# Patient Record
Sex: Female | Born: 1970 | Race: White | Hispanic: No | Marital: Married | Smoking: Never smoker
Health system: Southern US, Community
[De-identification: ages and names within clinical notes are randomized; demographics above are authoritative.]

## PROBLEM LIST (undated history)

## (undated) DIAGNOSIS — R55 Syncope and collapse: Secondary | ICD-10-CM

## (undated) DIAGNOSIS — M0520 Rheumatoid vasculitis with rheumatoid arthritis of unspecified site: Secondary | ICD-10-CM

## (undated) DIAGNOSIS — H269 Unspecified cataract: Secondary | ICD-10-CM

## (undated) DIAGNOSIS — N3010 Interstitial cystitis (chronic) without hematuria: Secondary | ICD-10-CM

## (undated) DIAGNOSIS — F419 Anxiety disorder, unspecified: Secondary | ICD-10-CM

## (undated) DIAGNOSIS — R079 Chest pain, unspecified: Secondary | ICD-10-CM

## (undated) DIAGNOSIS — S92353A Displaced fracture of fifth metatarsal bone, unspecified foot, initial encounter for closed fracture: Secondary | ICD-10-CM

## (undated) DIAGNOSIS — R002 Palpitations: Secondary | ICD-10-CM

## (undated) DIAGNOSIS — J329 Chronic sinusitis, unspecified: Secondary | ICD-10-CM

## (undated) DIAGNOSIS — M6289 Other specified disorders of muscle: Secondary | ICD-10-CM

## (undated) DIAGNOSIS — I8290 Acute embolism and thrombosis of unspecified vein: Secondary | ICD-10-CM

## (undated) DIAGNOSIS — N83209 Unspecified ovarian cyst, unspecified side: Secondary | ICD-10-CM

## (undated) DIAGNOSIS — R0789 Other chest pain: Secondary | ICD-10-CM

## (undated) DIAGNOSIS — I5189 Other ill-defined heart diseases: Secondary | ICD-10-CM

## (undated) DIAGNOSIS — D6851 Activated protein C resistance: Secondary | ICD-10-CM

## (undated) DIAGNOSIS — K859 Acute pancreatitis without necrosis or infection, unspecified: Secondary | ICD-10-CM

## (undated) DIAGNOSIS — M797 Fibromyalgia: Secondary | ICD-10-CM

## (undated) HISTORY — DX: Other ill-defined heart diseases: I51.89

## (undated) HISTORY — DX: Rheumatoid vasculitis with rheumatoid arthritis of unspecified site: M05.20

## (undated) HISTORY — DX: Palpitations: R00.2

## (undated) HISTORY — PX: WISDOM TOOTH EXTRACTION: SHX21

## (undated) HISTORY — PX: ABDOMINAL HYSTERECTOMY: SHX81

## (undated) HISTORY — DX: Syncope and collapse: R55

## (undated) HISTORY — DX: Unspecified cataract: H26.9

## (undated) HISTORY — PX: CHOLECYSTECTOMY: SHX55

## (undated) HISTORY — DX: Other chest pain: R07.89

## (undated) HISTORY — DX: Chest pain, unspecified: R07.9

## (undated) NOTE — *Deleted (*Deleted)
[facility] - Clinic Note  11/19/2019     CHIEF COMPLAINT Patient presents for No chief complaint on file.   HISTORY OF PRESENT ILLNESS: April [name] is a 18 y.o. 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 Evette Georges, pt states she had shingles several years ago and now is on  Valtrex daily  Referring physician: Sherron Monday, MD [address] [place],  [place] [address]  HISTORICAL INFORMATION:   Selected notes from the MEDICAL RECORD NUMBER Retinal Eval - Referred by Dr. Marcelino Freestone Tejan-Sie Pt has been on urology med, Elmirom, for over 5 yrs.  Eval for potential retinal damage.   CURRENT MEDICATIONS: No current outpatient medications on file. (Ophthalmic Drugs)   No current facility-administered medications for this visit. (Ophthalmic Drugs)   Current Outpatient Medications (Other)  Medication Sig  . albuterol (PROVENTIL HFA;VENTOLIN HFA) 108 (90 BASE) MCG/ACT inhaler Inhale 1 puff into the lungs as needed. For shortness of breath or wheezing  . Ascorbic Acid (VITA-C PO) Take by mouth.  [name] azelastine (ASTELIN) 0.1 % nasal spray as directed.  . baclofen (LIORESAL) 20 MG tablet Take 20 mg by mouth 3 (three) times daily.  . Biotin 60454 MCG  TABS Take by mouth daily.  . butalbital-acetaminophen-caffeine (FIORICET, ESGIC) 50-325-40 MG tablet TK 1 T PO Q 6 H PRN  . Calcium-Phosphorus-Vitamin D (CALCIUM GUMMIES PO) Take 1 tablet by mouth every evening.  . Cholecalciferol (VITAMIN D3) 5000 units TABS Take by mouth daily.  [name] co-enzyme Q-10 30 MG capsule Take 30 mg by mouth daily.  . Cyanocobalamin 5000 MCG SUBL Place 5,000 mcg under the tongue daily.   . diazepam (VALIUM) 10 MG tablet Take 10 mg by mouth daily.  [name] dicyclomine (BENTYL) 20 MG tablet Take 20 mg by mouth every 6 (six) hours.  [name] diltiazem (CARDIZEM CD) 120 MG 24 hr capsule TAKE 1 CAPSULE(120 MG) BY MOUTH DAILY  . ELMIRON 100 MG capsule Take 200 mg by mouth 2 (two) times daily.  [name] Fexofenadine HCl (ALLEGRA PO) Take 1 tablet by mouth daily as needed. For allergies/sinues  . fluconazole (DIFLUCAN) 150 MG tablet Take 150 mg by mouth daily as needed.   . fluticasone (FLONASE) 50 MCG/ACT nasal spray SHAKE LQ AND U 1 TO 2 SPRAYS IEN QD UTD  . Fluticasone-Salmeterol (ADVAIR) 250-50 MCG/DOSE AEPB Inhale 1 puff into the lungs 2 (two) times a day.   . furosemide (LASIX) 20 MG tablet Take 20 mg by mouth daily.  [name] guaiFENesin (MUCINEX) 600 MG 12 hr tablet Take 1,200 mg by mouth 2 (two) times daily.  . hydrOXYzine (ATARAX/VISTARIL) 50 MG tablet Take 50 mg by mouth at bedtime.  . hyoscyamine (LEVSIN SL) 0.125 MG SL  tablet Place 0.125 mg under the tongue every 4 (four) hours as needed for cramping.  . Lactobacillus (ACIDOPHILUS PROBIOTIC PO) Take by mouth 3 (three) times daily.  [name] lidocaine (XYLOCAINE) 5 % ointment Apply topically daily.  [name] Lysine 500 MG CAPS Take by mouth daily.  . montelukast (SINGULAIR) 10 MG tablet Take 10 mg by mouth at bedtime.  . Multiple Vitamin (MULITIVITAMIN WITH MINERALS) TABS Take 1 tablet by mouth every evening.   . nitrofurantoin (MACRODANTIN) 50 MG capsule Take 50 mg by mouth at bedtime.   [name] oxybutynin (DITROPAN-XL) 5 MG 24 hr tablet Take 5 mg by mouth as  needed.   [name] PROCTOZONE-HC 2.5 % rectal cream Apply topically as needed.  . progesterone (PROMETRIUM) 100 MG capsule Take 100 mg by mouth daily.   . Riboflavin (B2) 100 MG TABS Take by mouth daily.  . Simethicone 125 MG CAPS Take 125 mg by mouth 4 (four) times daily.  . TURMERIC PO Take by mouth daily.  [name] VALACYCLOVIR HCL PO Take 1,000 mg by mouth daily.   [name] VITAMIN A PO Take 3,000 mg by mouth daily.  . Zinc 25 MG TABS Take by mouth daily.   Current Facility-Administered Medications (Other)  Medication Route  . betamethasone acetate-betamethasone sodium phosphate (CELESTONE) injection 3 mg Intramuscular      REVIEW OF SYSTEMS:    ALLERGIES Allergies  Allergen Reactions  . Amitriptyline Other (See Comments)    Other reaction(s): Other Change in mental status  . Amoxicillin Rash  . Bromelains     Other reaction(s): Abdominal pain  . Celexa [Citalopram Hydrobromide] Other (See Comments)    Other reaction(s): Other Change in mental status  . Ivp Dye  [Iodinated Diagnostic Agents] Anaphylaxis  . Nortriptyline     Other reaction(s): Other  . Penicillins Swelling and Rash  . Pregabalin     Other reaction(s): Myalgias (muscle pain)  . Bromelains [Pineapple Extract] Other (See Comments)    Severe pain  . Budesonide-Formoterol Fumarate     Other reaction(s): Other  . Citalopram Other (See Comments)  . Mangifera Indica Swelling  . Methylene Blue Other (See Comments)    Caused inflamed pancreas, per pt should not use  . Sertraline Other (See Comments)    Other reaction(s): Other Suicidal thoughts  . Sulfa Antibiotics Other (See Comments)  . Sulfonamide Derivatives Other (See Comments)    excrutiating pain, patient states it caused her to have osteoarthritis  . Bromaline [Albertsons Di Bromm] Rash  . Other Rash    Mango    PAST MEDICAL HISTORY Past Medical History:  Diagnosis Date  . Anxiety 12/14/2011  . Asthma    no inhaler  . Chest pain    a. 12/2015 CTA chest:  no PE. No significant coronary Ca2+. Mosaic attenuation pattern in both lungs, may reflect small airway dzs; c. 12/2015 Ex MV: Walked 6 mins. No ischemia/scar.  . Diastolic dysfunction    a. 10/2015 Echo: EF nl, mod diast dysfxn. Mild MR/TR; b. 09/2016 Echo: EF 55-60%, no rwma. Nl RV size and fxn.  . Factor 5 Leiden mutation, heterozygous (HCC)   . Fibromyalgia   . Fracture of 5th metatarsal 03/2011   left foot -    . Heart palpitations    a. Notes intermittent tachycardia (sinus). Did not tolerate beta blockers 2/2 urinary incontinence.  . Interstitial cystitis   . Ovarian cyst   . Pancreatitis   . Pelvic floor dysfunction   . Rheumatoid arteritis (HCC)   .  Sinus infection    chronic  . Syncope    a. 08/2017 Event monitor: Average heart rate 77 (49-136).  Periods of sinus arrhythmia noted.  Triggered events corresponded with sinus rhythm, sinus arrhythmia, and artifact.  No significant ectopy, sustained arrhythmias, or prolonged pauses observed.  . 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   Past Surgical History:  Procedure Laterality Date  . ABDOMINAL HYSTERECTOMY    . CHOLECYSTECTOMY    . CYSTOSCOPY WITH HYDRODISTENSION AND BIOPSY  04/21/2011   Dr Lorenz Coaster  . LAPAROSCOPIC ASSISTED VAGINAL HYSTERECTOMY  08/21/2011   Procedure: LAPAROSCOPIC ASSISTED VAGINAL HYSTERECTOMY;  Surgeon: Jeani Hawking, MD;  Location: [facility];  Service: Gynecology;  Laterality: N/A;  OPEN LAPAROSCOPIC  . LAPAROSCOPIC NISSEN FUNDOPLICATION  2007  . SALPINGOOPHORECTOMY  08/21/2011   Procedure: SALPINGO OOPHERECTOMY;  Surgeon: Jeani Hawking, MD;  Location: [facility];  Service: Gynecology;  Laterality: Bilateral;  . TUBAL LIGATION  2010  . WISDOM TOOTH EXTRACTION      FAMILY HISTORY Family History  Problem Relation Age of Onset  . Heart Problems Mother   . Vascular Disease Mother   . Vascular  Disease Maternal Grandmother   . Anesthesia problems Neg Hx     SOCIAL HISTORY Social History   Tobacco Use  . Smoking status: Never Smoker  . Smokeless tobacco: Never Used  Vaping Use  . Vaping Use: Never used  Substance Use Topics  . Alcohol use: No  . Drug use: No         OPHTHALMIC EXAM:  Not recorded     IMAGING AND PROCEDURES  Imaging and Procedures for @TODAY @           ASSESSMENT/PLAN:    ICD-10-CM   1. Retinal edema  H35.81   2. Myopia of both eyes with astigmatism  H52.13    H52.203   3. Combined forms of age-related cataract of both eyes  H25.813     1. No retinal edema on exam or OCT  - no pigmentary maculopathy or degeneration consistent with Elmiron toxicity  - pt reports taking po Elmiron, 200 mg BID, since 2012 (8 yrs)  - OCT and autofluorescence without RPE atrophy  - recommend   2. Myopia with astigmatism OU  3. 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  - discussed diagnosis and progression  - not yet visually significant  - monitor for now   Ophthalmic Meds Ordered this visit:  No orders of the defined types were placed in this encounter.      No follow-ups on file.  There are no Patient Instructions on file for this visit.   Explained the diagnoses, plan, and follow up with the patient and they expressed understanding.  Patient expressed understanding of the importance of proper follow up care. '  This document serves as a record of services personally performed by Karie Chimera, MD, PhD. It was created on their behalf by Annalee Genta, COMT. The creation of this record is the provider's dictation and/or activities during the visit.  Electronically signed by: Annalee Genta, COMT 11/18/19 10:38 AM     Karie Chimera, M.D., Ph.D. Diseases & Surgery of the Retina and Vitreous [facility] 11/19/18      Abbreviations: Judie Petit  myopia (nearsighted); A astigmatism; H hyperopia (farsighted); P presbyopia; Mrx spectacle prescription;  CTL contact lenses; OD right eye; OS left eye; OU both eyes  XT exotropia; ET esotropia; PEK punctate epithelial keratitis; PEE punctate epithelial erosions; DES dry eye syndrome; MGD meibomian gland dysfunction; ATs artificial tears; PFAT's preservative free artificial tears; NSC nuclear sclerotic cataract; PSC posterior subcapsular cataract; ERM epi-retinal membrane; PVD posterior vitreous detachment; RD retinal detachment; DM diabetes mellitus; DR diabetic retinopathy; NPDR non-proliferative diabetic retinopathy; PDR proliferative diabetic retinopathy; CSME clinically significant macular edema; DME diabetic macular edema; dbh dot blot hemorrhages; CWS cotton wool spot; POAG primary open angle glaucoma; C/D cup-to-disc ratio; HVF humphrey visual field; GVF goldmann visual field; OCT optical coherence tomography; IOP intraocular pressure; BRVO Branch retinal vein occlusion; CRVO central retinal vein occlusion; CRAO central retinal artery occlusion; BRAO branch retinal artery occlusion; RT retinal tear; SB scleral buckle; PPV pars plana vitrectomy; VH Vitreous hemorrhage; PRP panretinal laser photocoagulation; IVK intravitreal kenalog; VMT vitreomacular traction; MH Macular hole;  NVD neovascularization of the disc; NVE neovascularization elsewhere; AREDS age related eye disease study; ARMD age related macular degeneration; POAG primary open angle glaucoma; EBMD epithelial/anterior basement membrane dystrophy; ACIOL anterior chamber intraocular lens; IOL intraocular lens; PCIOL posterior chamber intraocular lens; Phaco/IOL phacoemulsification with intraocular lens placement; PRK photorefractive keratectomy; LASIK laser assisted in situ keratomileusis; HTN hypertension; DM diabetes mellitus; COPD chronic obstructive pulmonary disease

---

## 2002-10-27 ENCOUNTER — Other Ambulatory Visit: Admission: RE | Admit: 2002-10-27 | Discharge: 2002-10-27 | Payer: Self-pay | Admitting: Obstetrics and Gynecology

## 2003-05-14 ENCOUNTER — Inpatient Hospital Stay (HOSPITAL_COMMUNITY): Admission: AD | Admit: 2003-05-14 | Discharge: 2003-05-16 | Payer: Self-pay | Admitting: Obstetrics and Gynecology

## 2003-06-25 ENCOUNTER — Other Ambulatory Visit: Admission: RE | Admit: 2003-06-25 | Discharge: 2003-06-25 | Payer: Self-pay | Admitting: Obstetrics and Gynecology

## 2004-08-01 ENCOUNTER — Other Ambulatory Visit: Admission: RE | Admit: 2004-08-01 | Discharge: 2004-08-01 | Payer: Self-pay | Admitting: Obstetrics and Gynecology

## 2004-12-27 ENCOUNTER — Inpatient Hospital Stay (HOSPITAL_COMMUNITY): Admission: AD | Admit: 2004-12-27 | Discharge: 2004-12-30 | Payer: Self-pay | Admitting: Obstetrics and Gynecology

## 2004-12-28 ENCOUNTER — Encounter (INDEPENDENT_AMBULATORY_CARE_PROVIDER_SITE_OTHER): Payer: Self-pay | Admitting: Specialist

## 2005-01-30 HISTORY — PX: LAPAROSCOPIC NISSEN FUNDOPLICATION: SHX1932

## 2005-02-17 ENCOUNTER — Other Ambulatory Visit: Admission: RE | Admit: 2005-02-17 | Discharge: 2005-02-17 | Payer: Self-pay | Admitting: Obstetrics and Gynecology

## 2005-08-09 ENCOUNTER — Emergency Department: Payer: Self-pay | Admitting: Emergency Medicine

## 2008-01-31 HISTORY — PX: TUBAL LIGATION: SHX77

## 2008-02-10 ENCOUNTER — Ambulatory Visit: Payer: Self-pay | Admitting: Oncology

## 2008-03-17 ENCOUNTER — Inpatient Hospital Stay (HOSPITAL_COMMUNITY): Admission: AD | Admit: 2008-03-17 | Discharge: 2008-03-17 | Payer: Self-pay | Admitting: Obstetrics and Gynecology

## 2008-03-26 ENCOUNTER — Ambulatory Visit (HOSPITAL_COMMUNITY): Admission: RE | Admit: 2008-03-26 | Discharge: 2008-03-26 | Payer: Self-pay | Admitting: Obstetrics and Gynecology

## 2008-05-10 ENCOUNTER — Inpatient Hospital Stay (HOSPITAL_COMMUNITY): Admission: AD | Admit: 2008-05-10 | Discharge: 2008-05-10 | Payer: Self-pay | Admitting: Obstetrics and Gynecology

## 2008-05-12 ENCOUNTER — Inpatient Hospital Stay (HOSPITAL_COMMUNITY): Admission: AD | Admit: 2008-05-12 | Discharge: 2008-05-12 | Payer: Self-pay | Admitting: Obstetrics and Gynecology

## 2008-05-18 ENCOUNTER — Inpatient Hospital Stay (HOSPITAL_COMMUNITY): Admission: RE | Admit: 2008-05-18 | Discharge: 2008-05-20 | Payer: Self-pay | Admitting: Obstetrics and Gynecology

## 2008-05-21 ENCOUNTER — Encounter: Admission: RE | Admit: 2008-05-21 | Discharge: 2008-06-19 | Payer: Self-pay | Admitting: Obstetrics and Gynecology

## 2008-06-20 ENCOUNTER — Encounter: Admission: RE | Admit: 2008-06-20 | Discharge: 2008-07-20 | Payer: Self-pay | Admitting: Obstetrics and Gynecology

## 2008-07-21 ENCOUNTER — Encounter: Admission: RE | Admit: 2008-07-21 | Discharge: 2008-08-19 | Payer: Self-pay | Admitting: Obstetrics and Gynecology

## 2008-08-20 ENCOUNTER — Encounter: Admission: RE | Admit: 2008-08-20 | Discharge: 2008-09-19 | Payer: Self-pay | Admitting: Obstetrics and Gynecology

## 2008-09-20 ENCOUNTER — Encounter: Admission: RE | Admit: 2008-09-20 | Discharge: 2008-10-08 | Payer: Self-pay | Admitting: Obstetrics and Gynecology

## 2010-02-04 ENCOUNTER — Emergency Department: Payer: Self-pay | Admitting: Emergency Medicine

## 2010-02-07 ENCOUNTER — Encounter (INDEPENDENT_AMBULATORY_CARE_PROVIDER_SITE_OTHER): Payer: Self-pay | Admitting: *Deleted

## 2010-03-03 NOTE — Letter (Signed)
Summary: New Patient letter  [facility]  [address] [place], [place] [address]   Phone: [phone]  Fax: [phone]       02/07/2010 MRN: [id number]  April [name] [address] [place], [place]  [address]  Dear Ms. Bernerd Pho,  Welcome to the Gastroenterology Division at [facility].    You are scheduled to see Dr.  Rob Bunting on March 14, 2010 at 8:30am on the 3rd floor at [facility], [address]  We ask that you try to arrive at our office 15 minutes prior to your appointment time to allow for check-in.  We would like you to complete the enclosed self-administered evaluation form prior to your visit and bring it with you on the day of your appointment.  We will review it with you.  Also, please bring a complete list of all your medications or, if you prefer, bring the medication bottles and we will list them.  Please bring your insurance card so that we may make a copy of it.  If your insurance requires a referral to see a specialist, please bring your referral form from your primary care physician.  Co-payments are due at the time of your visit and may be paid by cash, check or credit card.     Your office visit will consist of a consult with your physician (includes a physical exam), any laboratory testing he/she may order, scheduling of any necessary diagnostic testing (e.g. x-ray, ultrasound, CT-scan), and scheduling of a procedure (e.g. Endoscopy, Colonoscopy) if required.  Please allow enough time on your schedule to allow for any/all of these possibilities.    If you cannot keep your appointment, please call [phone] to cancel or reschedule prior to your appointment date.  This allows [place] the opportunity to schedule an appointment for another patient in need of care.  If you do not cancel or reschedule by 5 p.m. the business day prior to your appointment date, you will be charged a $50.00 late cancellation/no-show fee.    Thank you for  choosing [facility] for your medical needs.  We appreciate the opportunity to care for you.  Please visit [place] at our website  to learn more about our practice.                     Sincerely,                                                             The Gastroenterology Division

## 2010-03-14 ENCOUNTER — Encounter (INDEPENDENT_AMBULATORY_CARE_PROVIDER_SITE_OTHER): Payer: Self-pay | Admitting: *Deleted

## 2010-03-14 ENCOUNTER — Encounter: Payer: Self-pay | Admitting: Gastroenterology

## 2010-03-14 ENCOUNTER — Ambulatory Visit (INDEPENDENT_AMBULATORY_CARE_PROVIDER_SITE_OTHER): Payer: Self-pay | Admitting: Gastroenterology

## 2010-03-14 ENCOUNTER — Other Ambulatory Visit: Payer: Self-pay

## 2010-03-14 ENCOUNTER — Other Ambulatory Visit: Payer: Self-pay | Admitting: Gastroenterology

## 2010-03-14 DIAGNOSIS — R0989 Other specified symptoms and signs involving the circulatory and respiratory systems: Secondary | ICD-10-CM

## 2010-03-14 DIAGNOSIS — K589 Irritable bowel syndrome without diarrhea: Secondary | ICD-10-CM

## 2010-03-14 DIAGNOSIS — R109 Unspecified abdominal pain: Secondary | ICD-10-CM | POA: Insufficient documentation

## 2010-03-14 DIAGNOSIS — R197 Diarrhea, unspecified: Secondary | ICD-10-CM

## 2010-03-14 DIAGNOSIS — R0609 Other forms of dyspnea: Secondary | ICD-10-CM

## 2010-03-14 LAB — CBC WITH DIFFERENTIAL/PLATELET
Basophils Relative: 0.4 % (ref 0.0–3.0)
Eosinophils Relative: 1.3 % (ref 0.0–5.0)
Lymphocytes Relative: 32.6 % (ref 12.0–46.0)
MCV: 95.1 fl (ref 78.0–100.0)
Monocytes Absolute: 0.5 10*3/uL (ref 0.1–1.0)
Monocytes Relative: 8.6 % (ref 3.0–12.0)
Neutrophils Relative %: 57.1 % (ref 43.0–77.0)
RBC: 4.07 Mil/uL (ref 3.87–5.11)
WBC: 5.9 10*3/uL (ref 4.5–10.5)

## 2010-03-14 LAB — COMPREHENSIVE METABOLIC PANEL
Albumin: 3.9 g/dL (ref 3.5–5.2)
BUN: 5 mg/dL — ABNORMAL LOW (ref 6–23)
CO2: 28 mEq/L (ref 19–32)
Calcium: 9.1 mg/dL (ref 8.4–10.5)
Chloride: 101 mEq/L (ref 96–112)
Glucose, Bld: 78 mg/dL (ref 70–99)
Potassium: 4.4 mEq/L (ref 3.5–5.1)

## 2010-03-14 LAB — SEDIMENTATION RATE: Sed Rate: 23 mm/hr — ABNORMAL HIGH (ref 0–22)

## 2010-03-14 LAB — IGA: IgA: 175 mg/dL (ref 68–378)

## 2010-03-23 NOTE — Procedures (Signed)
Summary: Instructions for procedure/[place]  Instructions for procedure/[place]   Imported By: Sherian Rein 03/17/2010 09:48:55  _____________________________________________________________________  External Attachment:    Type:   Image     Comment:   External Document

## 2010-03-23 NOTE — Assessment & Plan Note (Signed)
History of Present Illness Visit Type: Initial Consult Primary GI MD: Rob Bunting MD Primary Provider: Oneta Rack, MD Chief Complaint:  multiple GI symptoms History of Present Illness:      Pleasant 40 year old woman who  was put on cipro recently for abd pains, left sided.    has been on abx recently for sinus infection.  she has had abd pains for a bout a decade   she had lap chol in 1997, retained CBD stones and then ERCP afterword.  She had pancreatitis (thought to be from retained stone), then ERCP.  10 day hospital stay.  she has had chronic loose stools for 10-15 years (up to 3-4 times a day).  She can have alternating constipation/diarrhea.    she was treated for serologic + h. pylori in 2009.  lap nissen in 2007 for GERD symptoms, was on maximum acid blockers at that point.  Certain foods are not bothersome (caused pain, bloating).  since then her symptoms only occur several times a month.  She is not on antiacid meds anymore.  She had EGD in 2007,  she is unclear of the results  Dr. Rosalita Levan at [facility].  She had a small bowel capsule around that time (unclear of the results).  Lately she is still bothered by HIP pain and BACK pains.  She also has lower abd, pelvic pressure.  she also has back headaches that are improved with BMs.   Sitting down can make her lower pelvic pressure worse.  She has bloating.  She takes hyomax for bloating, sits in warm water, believes these are gas related.    Never nocturnal BMs,  never blood in her stool.  She has been gaining weight, overall up 10 pounds in 6 months.    she still has heartburn occasionally, very difficult with essentially pan GI symptoms.  Less than once a month.  was on cipro recently, bowels much better.  she is on no NSAIDs.           Current Medications (verified): 1)  Tramadol Hcl 50 Mg Tabs (Tramadol Hcl) .[name].. 1 By Mouth Once Daily 2)  Calcium 500 Mg Tabs (Calcium) .[name].. 1 By  Mouth Once Daily 3)  Multivitamins  Caps (Multiple Vitamin) .[name].. 1 By Mouth Once Daily 4)  Advair Hfa 45-21 Mcg/act Aero (Fluticasone-Salmeterol) .... 2 Times Daily 5)  Vitamin A & D 8000-400 Unit Caps (Vitamins A & D) .[name].. 1 By Mouth Once Daily 6)  Zyrtec Allergy 10 Mg Caps (Cetirizine Hcl) .... Take 1 By Mouth Once Daily 7)  Hyomax-Sl 0.125 Mg Subl (Hyoscyamine Sulfate) .... Take As Needed  Allergies (verified): 1)  ! Pcn 2)  ! Sulfa 3)  ! * Methaline Blue  Past History:  Past Medical History: ovarian cyst, arthritis, asthma, chronic headaches, gallstone disease, irritable bowel syndrome, biliary pancreatitis 1997, urinary tract infection, GERD  Past Surgical History: tubal ligation 2010, cholecystectomy 1997, ERCP 1997 for retained bile  duct stone, Nissen fundoplication 2007, hemorrhoidectomy 2000,  Family History: no colon cancer  Social History: she is married, she has 4 children, she does not smoke cigarettes or drink alcohol.  Review of Systems       Pertinent positive and negative review of systems were noted in the above HPI and GI specific review of systems.  All other review of systems was otherwise negative.   Vital Signs:  Patient profile:   40 year old female Height:      65 inches  Weight:      153 pounds BMI:     25.55 Pulse rate:   100 / minute Pulse rhythm:   regular BP sitting:   98 / 50  (left arm)  Vitals Entered By: Milford Cage NCMA (March 14, 2010 8:55 AM)  Physical Exam  Additional Exam:  Constitutional: generally well appearing Psychiatric: alert and oriented times 3 Eyes: extraocular movements intact Mouth: oropharynx moist, no lesions Neck: supple, no lymphadenopathy Cardiovascular: heart regular rate and rythm Lungs: CTA bilaterally Abdomen: soft, non-tender, non-distended, no obvious ascites, no peritoneal signs, normal bowel sounds Extremities: no lower extremity edema bilaterally Skin: no lesions on visible  extremities    Impression & Recommendations:  Problem # 1:  Multiple upper and lower GI symptoms most likely functional symptoms. GERD may contribute. She had her gallbladder removed and predominately has loose stools so perhaps bile salt dysregulation is contributing. She will get a basic set of labs including CBC, complete metabolic profile, thyroid testing, sedimentation rate, celiac sprue testing. We will set her up for colonoscopy at her soonest convenience. She will try cholestyramine powder once daily and knows that she can titrate this up or down as needed.  Problem # 2:  back pain, hip pain, neck pain I explained to her that it is unlikely that any of these symptoms are GI related and probably would not be able to explain them.  Other Orders: TLB-CBC Platelet - w/Differential (85025-CBCD) TLB-CMP (Comprehensive Metabolic Pnl) (80053-COMP) T-Sprue Panel (Celiac Disease Aby Eval) (83516x3/[address]) TLB-IgA (Immunoglobulin A) (82784-IGA) TLB-Sedimentation Rate (ESR) (85652-ESR)  Patient Instructions: 1)  You will be scheduled to have a colonoscopy at [facility] with propfol. 2)  You will get lab test(s) done today (cbc, cmet, celiac panel, total IgA, ESR). 3)  We will get records from [facility]. 4)  Trial of cholestyramine powder, once daily. 5)  A copy of this information will be sent to Dr. Vincente Poli, Dr. Oneta Rack. 6)  The medication list was reviewed and reconciled.  All changed / newly prescribed medications were explained.  A complete medication list was provided to the patient / caregiver. Prescriptions: CHOLESTYRAMINE   POWD (CHOLESTYRAMINE) Take 4 grams powder, once daily  #1 month x 11   Entered and Authorized by:   Rachael Fee MD   Signed by:   Rachael Fee MD on 03/14/2010   Method used:   Electronically to        [facility] [address] (retail)       [address] [place], [place]  [address]        Ph: [phone]       Fax: [phone]   RxID:   [id number]   Appended Document: Orders Update/Movi    Clinical Lists Changes  Medications: Added new medication of MOVIPREP 100 GM  SOLR (PEG-KCL-NACL-NASULF-NA ASC-C) As per prep instructions. - Signed Rx of MOVIPREP 100 GM  SOLR (PEG-KCL-NACL-NASULF-NA ASC-C) As per prep instructions.;  #1 x 0;  Signed;  Entered by: Chales Abrahams CMA (AAMA);  Authorized by: Rachael Fee MD;  Method used: Electronically to [facility] [address], [place], [place], [place]  [address], Ph: [phone], Fax: [phone] Orders: Added new Test order of ZCOL (ZCOL) - Signed    Prescriptions: MOVIPREP 100 GM  SOLR (PEG-KCL-NACL-NASULF-NA ASC-C) As per prep instructions.  #1 x 0  Entered by:   Chales Abrahams CMA (AAMA)   Authorized by:   Rachael Fee MD   Signed by:   Chales Abrahams CMA (AAMA) on 03/14/2010   Method used:   Electronically to        [facility] [address] (retail)       [address] [place], [place]  [address]       Ph: [phone]       Fax: [phone]   RxID:   [id number]

## 2010-03-23 NOTE — Letter (Signed)
Summary: [facility] Instructions  [facility]  [address] [place], [place] [address]   Phone: [phone]  Fax: [phone]       April [name]    Sep 21, 1970    MRN: [id number]        Procedure Day /Date:03/31/10 THURS     Arrival ONGE:9528 am     Procedure Time:145 pm     Location of Procedure:                     X  [facility] ( Outpatient Registration)   PREPARATION FOR COLONOSCOPY WITH MOVIPREP   Starting 5 days prior to your procedure 03/26/10 do not eat nuts, seeds, popcorn, corn, beans, peas,  salads, or any raw vegetables.  Do not take any fiber supplements (e.g. Metamucil, Citrucel, and Benefiber).  THE DAY BEFORE YOUR PROCEDURE         DATE: 03/30/10  DAY: WED  1.  Drink clear liquids the entire day-NO SOLID FOOD  2.  Do not drink anything colored red or purple.  Avoid juices with pulp.  No orange juice.  3.  Drink at least 64 oz. (8 glasses) of fluid/clear liquids during the day to prevent dehydration and help the prep work efficiently.  CLEAR LIQUIDS INCLUDE: Water Jello Ice Popsicles Tea (sugar ok, no milk/cream) Powdered fruit flavored drinks Coffee (sugar ok, no milk/cream) Gatorade Juice: apple, white grape, white cranberry  Lemonade Clear bullion, consomm, broth Carbonated beverages (any kind) Strained chicken noodle soup Hard Candy                             4.  In the morning, mix first dose of MoviPrep solution:    Empty 1 Pouch A and 1 Pouch B into the disposable container    Add lukewarm drinking water to the top line of the container. Mix to dissolve    Refrigerate (mixed solution should be used within 24 hrs)  5.  Begin drinking the prep at 5:00 p.m. The MoviPrep container is divided by 4 marks.   Every 15 minutes drink the solution down to the next mark (approximately 8 oz) until the full liter is complete.   6.  Follow completed prep with 16 oz of clear liquid of your choice (Nothing red or purple).   Continue to drink clear liquids until bedtime.  7.  Before going to bed, mix second dose of MoviPrep solution:    Empty 1 Pouch A and 1 Pouch B into the disposable container    Add lukewarm drinking water to the top line of the container. Mix to dissolve    Refrigerate  THE DAY OF YOUR PROCEDURE      DATE: 03/31/10  DAY: THURS  Beginning at 845 a.m. (5 hours before procedure):         1. Every 15 minutes, drink the solution down to the next mark (approx 8 oz) until the full liter is complete.  Nothing to eat or drink after midnight except prep solution  MEDICATION INSTRUCTIONS  Unless otherwise instructed, you should take regular prescription medications with a small sip of water   as early as possible the morning of your procedure.           OTHER INSTRUCTIONS  You will need a responsible adult at least 39 years of age to accompany you and drive you home.   This person must remain in the  waiting room during your procedure.  Wear loose fitting clothing that is easily removed.  Leave jewelry and other valuables at home.  However, you may wish to bring a book to read or  an iPod/MP3 player to listen to music as you wait for your procedure to start.  Remove all body piercing jewelry and leave at home.  Total time from sign-in until discharge is approximately 2-3 hours.  You should go home directly after your procedure and rest.  You can resume normal activities the  day after your procedure.  The day of your procedure you should not:   Drive   Make legal decisions   Operate machinery   Drink alcohol   Return to work  You will receive specific instructions about eating, activities and medications before you leave.    The above instructions have been reviewed and explained to me by   _______________________    I fully understand and can verbalize these instructions _____________________________ Date _________

## 2010-03-31 ENCOUNTER — Ambulatory Visit (HOSPITAL_COMMUNITY)
Admission: RE | Admit: 2010-03-31 | Discharge: 2010-03-31 | Disposition: A | Discharge status: Home / Self Care | Payer: Self-pay | Source: Ambulatory Visit | Attending: Gastroenterology | Admitting: Gastroenterology

## 2010-03-31 ENCOUNTER — Other Ambulatory Visit: Payer: Self-pay | Admitting: Gastroenterology

## 2010-03-31 DIAGNOSIS — R197 Diarrhea, unspecified: Secondary | ICD-10-CM

## 2010-03-31 DIAGNOSIS — K219 Gastro-esophageal reflux disease without esophagitis: Secondary | ICD-10-CM | POA: Insufficient documentation

## 2010-03-31 DIAGNOSIS — K648 Other hemorrhoids: Secondary | ICD-10-CM | POA: Insufficient documentation

## 2010-03-31 DIAGNOSIS — R109 Unspecified abdominal pain: Secondary | ICD-10-CM | POA: Insufficient documentation

## 2010-03-31 DIAGNOSIS — K5730 Diverticulosis of large intestine without perforation or abscess without bleeding: Secondary | ICD-10-CM

## 2010-03-31 DIAGNOSIS — J45909 Unspecified asthma, uncomplicated: Secondary | ICD-10-CM | POA: Insufficient documentation

## 2010-04-07 NOTE — Procedures (Signed)
Summary: Colonoscopy  Patient: [name] Note: All result statuses are Final unless otherwise noted.  Tests: (1) Colonoscopy (COL)   COL Colonoscopy           DONE     [facility]     [address] [place], [place]  [address]           COLONOSCOPY PROCEDURE REPORT           PATIENT:  April [name], April [name]  MR#:  [id number]     BIRTHDATE:  1970-04-27, 39 yrs. old  GENDER:  female     ENDOSCOPIST:  Rachael Fee, MD     REF. BY:  Lucky Cowboy, M.D.     PROCEDURE DATE:  03/31/2010     PROCEDURE:  Colonoscopy with biopsy     ASA CLASS:  Class II     INDICATIONS:  abd pains, chronic loose stools     MEDICATIONS:   MAC sedation, administered by CRNA           DESCRIPTION OF PROCEDURE:   After the risks benefits and     alternatives of the procedure were thoroughly explained, informed     consent was obtained.  Digital rectal exam was performed and     revealed no rectal masses.   The Pentax Ped Colon [id number]     endoscope was introduced through the anus and advanced to the     terminal ileum which was intubated for a short distance, without     limitations.  The quality of the prep was good, using MoviPrep.     The instrument was then slowly withdrawn as the colon was fully     examined.<<PROCEDUREIMAGES>>           FINDINGS:  There were a few small scattered left sided     diverticulum.  Internal Hemorrhoids were found.  The terminal     ileum appeared normal (see image002).  This was otherwise a normal     examination of the colon (see image001 and image003).     Retroflexed views in the rectum revealed no abnormalities.    The     scope was then withdrawn from the patient and the procedure     completed.     COMPLICATIONS:  None           ENDOSCOPIC IMPRESSION:     1) A few left sided diverticulum     2) Small nternal hemorrhoids     3) Normal terminal ileum; no Crohn's disease     4) Otherwise normal examination;  colon was randomly biopsied to     check  for microscopic colitis.           RECOMMENDATIONS:     Await final biopsy report.     For now, stay on cholestyramine powder. Titrate as needed.           ______________________________     Rachael Fee, MD           cc: Marcelle Overlie, MD           n.     eSIGNED:   Rachael Fee at 03/31/2010 01:45 PM           Debroah Loop, [id number]  Note: An exclamation mark (!) indicates a result that was not dispersed into the flowsheet. Document Creation Date: 03/31/2010 1:46 PM _______________________________________________________________________  (1) Order result status: Final Collection or observation date-time: 03/31/2010 13:40  Requested date-time:  Receipt date-time:  Reported date-time:  Referring Physician:   Ordering Physician: Rob Bunting [phone]) Specimen Source:  Source: Launa Grill Order Number: [id number] Lab site:

## 2010-04-21 ENCOUNTER — Telehealth: Payer: Self-pay | Admitting: Gastroenterology

## 2010-04-21 NOTE — Telephone Encounter (Signed)
Pt woke up and had acid reflux that was so bad it came up into her mouth and was actually wetting her pillow, was very foamy and yellow.  After she had been lying down for two hours, she had taken doxycycline and eaten a salad just before she went to bed.  She now feels like she has something stuck in her throat.  She has chest pain that is constant.  She had been prescribed Meloxicam and took it for 5 days and then stopped because of the above symptoms.  She took Prilosec today and tried Pepcid twice daily for 1 week with no relief.  She last had EGD 5 years ago as well [employer].  We have not received records from [facility] so she will call them and make sure they send those today.   Please advise

## 2010-04-21 NOTE — Telephone Encounter (Signed)
Left message on machine to call back  

## 2010-04-21 NOTE — Telephone Encounter (Signed)
Pt aware she will stop the med and start the PPI twice daily and call to report on symptoms she does not need refill at this time

## 2010-04-21 NOTE — Telephone Encounter (Signed)
doxycilcine can cause ulcers in the esophagus. Not sure why she is taking it, but she should hold it for a week unless absolutely necessary.  Should be taking PPI twice daily.  She needs to call in one week to report on her symtoms

## 2010-05-11 LAB — COMPREHENSIVE METABOLIC PANEL
ALT: 18 U/L (ref 0–35)
AST: 31 U/L (ref 0–37)
Albumin: 2.9 g/dL — ABNORMAL LOW (ref 3.5–5.2)
Alkaline Phosphatase: 73 U/L (ref 39–117)
BUN: 2 mg/dL — ABNORMAL LOW (ref 6–23)
Chloride: 105 mEq/L (ref 96–112)
GFR calc Af Amer: >60 mL/min (ref >60)
Potassium: 3.5 mEq/L (ref 3.5–5.1)
Sodium: 136 mEq/L (ref 135–145)
Total Bilirubin: 0.8 mg/dL (ref 0.3–1.2)
Total Protein: 5.9 g/dL — ABNORMAL LOW (ref 6.0–8.3)

## 2010-05-11 LAB — URINALYSIS, ROUTINE W REFLEX MICROSCOPIC
Glucose, UA: NEGATIVE mg/dL
Hgb urine dipstick: NEGATIVE
Ketones, ur: 40 mg/dL — AB
Leukocytes, UA: NEGATIVE
Leukocytes, UA: NEGATIVE
Nitrite: NEGATIVE
Protein, ur: 30 mg/dL — AB
Specific Gravity, Urine: 1.025 (ref 1.005–1.030)
Urobilinogen, UA: 0.2 mg/dL (ref 0.0–1.0)
pH: 6.5 (ref 5.0–8.0)
pH: 6.5 (ref 5.0–8.0)

## 2010-05-11 LAB — RPR: RPR Ser Ql: NONREACTIVE

## 2010-05-11 LAB — CBC
HCT: 35.6 % — ABNORMAL LOW (ref 36.0–46.0)
MCHC: 34.3 g/dL (ref 30.0–36.0)
MCV: 96.3 fL (ref 78.0–100.0)
MCV: 97.3 fL (ref 78.0–100.0)
Platelets: 156 10*3/uL (ref 150–400)
Platelets: 167 10*3/uL (ref 150–400)
RDW: 13.1 % (ref 11.5–15.5)
RDW: 13.3 % (ref 11.5–15.5)
RDW: 12.9 % (ref 11.5–15.5)
WBC: 8.5 10*3/uL (ref 4.0–10.5)
WBC: 6.4 10*3/uL (ref 4.0–10.5)

## 2010-05-11 LAB — URINE MICROSCOPIC-ADD ON: RBC / HPF: NONE SEEN RBC/hpf (ref <3)

## 2010-05-11 LAB — URIC ACID: Uric Acid, Serum: 7 mg/dL (ref 2.4–7.0)

## 2010-05-17 LAB — PROTEIN S, TOTAL: Protein S Ag, Total: 65 % — ABNORMAL LOW (ref 70–140)

## 2010-05-17 LAB — LUPUS ANTICOAGULANT PANEL: PTT Lupus Anticoagulant: 39.4 secs (ref 36.3–48.8)

## 2010-05-17 LAB — BETA-2-GLYCOPROTEIN I ABS, IGG/M/A
Beta-2-Glycoprotein I IgA: 4 U/mL (ref <10)
Beta-2-Glycoprotein I IgM: <4 U/mL (ref <10)

## 2010-05-17 LAB — ANTITHROMBIN III: AntiThromb III Func: 116 % (ref 76–126)

## 2010-05-17 LAB — CARDIOLIPIN ANTIBODIES, IGG, IGM, IGA
Anticardiolipin IgA: <7 [APL'U] — ABNORMAL LOW (ref <13)
Anticardiolipin IgM: 20 [MPL'U] (ref <10)

## 2010-05-17 LAB — PROTHROMBIN GENE MUTATION

## 2010-06-14 NOTE — Letter (Signed)
March 26, 2008    April [name], M.D.  [address] [place]  [place] [address]   RE:   April, [name]  MR#   [id number]  ACC#        [id number]   MFM [name] REPORT   Dear Dr. Vincente [name],   Thank you for referring your patient, April [name] for maternal fetal  medicine [name].  As you are aware, April [name] is a 40 year old  gravida 4, para 3, at [redacted] weeks gestation based on her last menstrual  period and confirmed and by early ultrasound.  As you are also aware,  April [name] has recently been found to be heterozygous for Factor V Leiden  mutation.   April [name] was feeling well today and had no specific complaints.  She  reported good fetal movement and denied vaginal bleeding, loss of fluid  per vagina or significant uterine activity.  She reported her pregnancy  to be relatively uncomplicated to this point with the exception of some  episodes of preterm contractions and pelvic pressure, which have  resolved at the present time.   April [name] was recently found to be heterozygous with a Factor V Leiden  mutation.  She underwent this evaluation after her father was recently  diagnosed as having a Factor V Leiden mutation.  She is unsure if her  father is all homozygous or heterozygous.  He was evaluated after he  developed a pulmonary embolus after shoulder surgery, as well as a lower  extremity DVT.  April [name] herself has never had any thromboembolic  events.  She took combination oral contraceptives for approximately 10  years in the past with no difficulties other than occasional headaches.  [name], she has had three prior term pregnancies without  complication either prenatally or intrapartum or postpartum related to  thromboembolic events.  All of her pregnancies were [name]  uncomplicated, and she had term vaginal deliveries with all of these.  She has had no other pregnancies or adverse pregnancy outcomes.   April [name] other medical  history includes a history of a  cholecystectomy with postoperative pancreatitis.  [name], she has  had significant acid reflux to require a Nissen fundoplication in 2007.  This was done laparoscopically.  She has also had several  hemorrhoidectomies and reports chronic symptoms of diarrhea and  bloating.  She reports having care for these with a gastroenterologist.  April [name] has asthma, which she reports is well controlled  with an albuterol inhaler.   Her only other current medication is prenatal vitamins, simethicone,  Protonix, as well as Flonase.  She does have DRUG ALLERGIES TO  PENICILLIN, KEFLEX, METHYLENE BLUE AND SULFA.   Her family history in addition to that described for her father above  includes early stroke and MI in her uncles and a brother with symptoms  suggestive of TIAs.  Although she reports poor lifestyle choices for her  uncles and reports that her brother has not sought formal care for his  symptoms.   April [name] has a history of abnormal Pap smears, treated with cryotherapy  to the cervix at age 48.  She has also had chlamydia and condyloma in  the past.  She had a recent Pap smear that was within normal limits and  has had recent negative prenatal cultures.  April [name] does not smoke,  drink alcohol or use street drugs.   Review of systems was performed today and was negative except as  described above.  On exam today, Ms. April [name] appeared to be in no acute  distress.  Her blood pressure was 97/60, her pulse was 74 beats per  minute and her weight was 164 pounds.   Implications of Factor 5 Leiden in general and specifically related  pregnancy were discussed with April [name] at length.  She understands that  pregnancy is a condition with increased hypercoagulability and that  having a Factor V Leiden mutation may predispose her to a thrombotic  event during her pregnancy.  However, given that she has is heterozygous  for this mutation and the  fact that she has is of normal weight and has  had three prior pregnancies, as well as long history of use of  combination oral contraceptives without any thrombotic events and  significant hospitalization for pancreatitis, the benefit of benefit of  anticoagulation for her during pregnancy is unclear.  The risks of  anticoagulation, specifically prophylactic dose Lovenox were discussed,  including but not limited to, heparin associated thrombocytopenia, local  reactions and increased bleeding at the time of delivery were reviewed  with April [name].  April [name] is uncertain if she has had an evaluation for  other inherited or acquired thrombophilia.  It may be of benefit to  evaluate her for these conditions and should she have more than one  predisposing factor, consider the use of prophylactic Lovenox at that  time.  She was agreeable to this plan and a thrombophilia panel  excluding a Factor V Leiden mutation was drawn today.   April [name] advanced maternal age was also discussed today.  She  understands the increased risk of aneuploidy and states that she has  discussed this with you at length and declined diagnostic testing for  fetal aneuploidy.  She also reports that her ultrasounds during this  pregnancy have been reassuring.   We will await the results of April [name]'s follow-up thrombophilia panel  and make further recommendations regarding the benefit of prophylactic  anticoagulation for her.  [name], given her age and history of a  Nissen fundoplication and Factor V Leiden mutation, we would recommended  a follow-up ultrasound for evaluation of fetal growth.  We will be  pleased to perform this exam.  If this is desired, please call the  office to schedule.  Thank you for allowing [place] to participate in the  care of Ms. April [name].  We will forward results of her labs to your office.  Please free to contact us at any time regarding this or any other  patient you may have.    Sincerely,      Heather L. Rachel Bo, MD  Maternal Fetal Medicine  [facility]     HLM/MEDQ  D:  03/27/2008  T:  03/27/2008  Job:  [id number]

## 2010-06-14 NOTE — Op Note (Signed)
April [name], April [name] NO.:  [id number]   MEDICAL RECORD NO.:  [id number]          PATIENT TYPE:  INP   LOCATION:  9104                          FACILITY:  [facility]   PHYSICIAN:  Freddy Finner, M.D.   DATE OF BIRTH:  02/15/1970   DATE OF PROCEDURE:  05/19/2008  DATE OF DISCHARGE:                               OPERATIVE REPORT   PREOPERATIVE DIAGNOSIS:  Multiparity.   POSTOPERATIVE DIAGNOSIS:  Multiparity.   OPERATIVE PROCEDURE:  Postpartum tubal ligation using Filshie clips   SURGEON:  Freddy Finner, M.D.   ANESTHESIA:  Spinal.   INTRAOPERATIVE COMPLICATIONS:  None.   ESTIMATED INTRAOPERATIVE BLOOD LOSS:  Less than 10 mL.   The patient is a 40 year old who gave birth to her fourth viable child  on the day prior to this procedure.  She has requested permanent  sterilization.   She was brought to the operating room, placed under adequate spinal  anesthesia, placed in the dorsal recumbent position.  The abdomen was  prepped in the usual fashion.  Bladder was evacuated with a sterile  catheter.  Sterile drapes were applied.  An elliptical incision in the  subumbilical area was made through an old scar and carried sharply down  to fascia which was entered sharply and extended to the external skin  incision.  Peritoneum was entered sharply.  Using the [employer]  retractors,  the right fallopian tube was identified.  It was grasped  with a Tanja Port, traced to its distal fimbriated end to confirm that it  was the tube.  The isthmic portion of the tube was occluded with Filshie  clip.  The left side was treated identically.  The instruments were then  removed.  Fascia was closed with running zero chromic.  The subcuticular  suture of running 3-0 Monocryl was used to close the skin.  The patient  tolerated the operative procedure well and was taken to recovery in good  condition.      Freddy Finner, M.D.  Electronically Signed     WRN/MEDQ  D:  05/19/2008   T:  05/19/2008  Job:  [id number]

## 2010-10-13 ENCOUNTER — Other Ambulatory Visit: Payer: Self-pay | Admitting: Neurology

## 2010-10-13 DIAGNOSIS — R202 Paresthesia of skin: Secondary | ICD-10-CM

## 2010-10-20 ENCOUNTER — Ambulatory Visit
Admission: RE | Admit: 2010-10-20 | Discharge: 2010-10-20 | Disposition: A | Discharge status: Home / Self Care | Payer: Self-pay | Source: Ambulatory Visit | Attending: Neurology | Admitting: Neurology

## 2010-10-20 DIAGNOSIS — R202 Paresthesia of skin: Secondary | ICD-10-CM

## 2010-10-27 DIAGNOSIS — Z8669 Personal history of other diseases of the nervous system and sense organs: Secondary | ICD-10-CM | POA: Insufficient documentation

## 2010-10-27 DIAGNOSIS — R102 Pelvic and perineal pain: Secondary | ICD-10-CM | POA: Insufficient documentation

## 2010-10-27 DIAGNOSIS — K580 Irritable bowel syndrome with diarrhea: Secondary | ICD-10-CM | POA: Insufficient documentation

## 2011-03-31 DIAGNOSIS — S92353A Displaced fracture of fifth metatarsal bone, unspecified foot, initial encounter for closed fracture: Secondary | ICD-10-CM

## 2011-03-31 HISTORY — DX: Displaced fracture of fifth metatarsal bone, unspecified foot, initial encounter for closed fracture: S92.353A

## 2011-04-17 ENCOUNTER — Emergency Department: Payer: Self-pay | Admitting: Emergency Medicine

## 2011-04-21 HISTORY — PX: CYSTOSCOPY WITH HYDRODISTENSION AND BIOPSY: SHX5127

## 2011-05-09 ENCOUNTER — Ambulatory Visit: Payer: No Typology Code available for payment source | Admitting: Family Medicine

## 2011-05-13 ENCOUNTER — Emergency Department (HOSPITAL_COMMUNITY)
Admission: EM | Admit: 2011-05-13 | Discharge: 2011-05-13 | Disposition: A | Discharge status: Home / Self Care | Payer: Medicaid Other | Attending: Emergency Medicine | Admitting: Emergency Medicine

## 2011-05-13 ENCOUNTER — Encounter (HOSPITAL_COMMUNITY): Payer: Self-pay | Admitting: *Deleted

## 2011-05-13 DIAGNOSIS — Z79899 Other long term (current) drug therapy: Secondary | ICD-10-CM | POA: Insufficient documentation

## 2011-05-13 DIAGNOSIS — IMO0001 Reserved for inherently not codable concepts without codable children: Secondary | ICD-10-CM | POA: Insufficient documentation

## 2011-05-13 DIAGNOSIS — R300 Dysuria: Secondary | ICD-10-CM | POA: Insufficient documentation

## 2011-05-13 DIAGNOSIS — J45909 Unspecified asthma, uncomplicated: Secondary | ICD-10-CM | POA: Insufficient documentation

## 2011-05-13 DIAGNOSIS — N390 Urinary tract infection, site not specified: Secondary | ICD-10-CM | POA: Insufficient documentation

## 2011-05-13 HISTORY — DX: Activated protein C resistance: D68.51

## 2011-05-13 HISTORY — DX: Unspecified ovarian cyst, unspecified side: N83.209

## 2011-05-13 HISTORY — DX: Interstitial cystitis (chronic) without hematuria: N30.10

## 2011-05-13 HISTORY — DX: Acute pancreatitis without necrosis or infection, unspecified: K85.90

## 2011-05-13 HISTORY — DX: Fibromyalgia: M79.7

## 2011-05-13 HISTORY — DX: Chronic sinusitis, unspecified: J32.9

## 2011-05-13 HISTORY — DX: Displaced fracture of fifth metatarsal bone, unspecified foot, initial encounter for closed fracture: S92.353A

## 2011-05-13 HISTORY — DX: Other specified disorders of muscle: M62.89

## 2011-05-13 LAB — URINE MICROSCOPIC-ADD ON

## 2011-05-13 LAB — DIFFERENTIAL
Basophils Absolute: 0 10*3/uL (ref 0.0–0.1)
Basophils Relative: 0 % (ref 0–1)
Neutro Abs: 8.5 10*3/uL — ABNORMAL HIGH (ref 1.7–7.7)
Neutrophils Relative %: 67 % (ref 43–77)

## 2011-05-13 LAB — URINALYSIS, ROUTINE W REFLEX MICROSCOPIC
Ketones, ur: NEGATIVE mg/dL
Nitrite: POSITIVE — AB
Protein, ur: 30 mg/dL — AB
pH: 7 (ref 5.0–8.0)

## 2011-05-13 LAB — BASIC METABOLIC PANEL
Chloride: 98 mEq/L (ref 96–112)
GFR calc Af Amer: >90 mL/min (ref >90)
Potassium: 3.2 mEq/L — ABNORMAL LOW (ref 3.5–5.1)
Sodium: 137 mEq/L (ref 135–145)

## 2011-05-13 LAB — CBC
Hemoglobin: 15 g/dL (ref 12.0–15.0)
MCHC: 34.7 g/dL (ref 30.0–36.0)
Platelets: 270 10*3/uL (ref 150–400)
RDW: 13.4 % (ref 11.5–15.5)

## 2011-05-13 LAB — PREGNANCY, URINE: Preg Test, Ur: NEGATIVE

## 2011-05-13 MED ORDER — ONDANSETRON HCL 4 MG/2ML IJ SOLN
4.0000 mg | Freq: Once | INTRAMUSCULAR | Status: AC
  Administered 2011-05-13: 4 mg via INTRAVENOUS
  Filled 2011-05-13: qty 2

## 2011-05-13 MED ORDER — HYDROMORPHONE HCL PF 1 MG/ML IJ SOLN
1.0000 mg | Freq: Once | INTRAMUSCULAR | Status: AC
  Administered 2011-05-13: 1 mg via INTRAVENOUS
  Filled 2011-05-13: qty 1

## 2011-05-13 MED ORDER — DEXTROSE 5 % IV SOLN
1.0000 g | Freq: Once | INTRAVENOUS | Status: AC
  Administered 2011-05-13: 1 g via INTRAVENOUS
  Filled 2011-05-13: qty 10

## 2011-05-13 MED ORDER — CIPROFLOXACIN HCL 500 MG PO TABS: 500.0000 mg | ORAL_TABLET | Freq: Two times a day (BID) | ORAL | Status: AC

## 2011-05-13 MED ORDER — POTASSIUM CHLORIDE CRYS ER 20 MEQ PO TBCR
40.0000 meq | EXTENDED_RELEASE_TABLET | Freq: Once | ORAL | Status: AC
  Administered 2011-05-13: 40 meq via ORAL
  Filled 2011-05-13: qty 2

## 2011-05-13 MED ORDER — SODIUM CHLORIDE 0.9 % IV BOLUS (SEPSIS)
500.0000 mL | Freq: Once | INTRAVENOUS | Status: AC
  Administered 2011-05-13: 500 mL via INTRAVENOUS

## 2011-05-13 NOTE — ED Provider Notes (Signed)
History     CSN: [id number]  Arrival date & time 05/13/11  1604   First MD Initiated Contact with Patient 05/13/11 1759      Chief Complaint  Patient presents with  . Dysuria    (Consider location/radiation/quality/duration/timing/severity/associated sxs/prior treatment) HPI Comments: Patient presents with dysuria and cloudy urine that started yesterday. Patient reports having a strong odor to her urine for several weeks. She has also had generalized fatigue, bladder spasms, and temperature 99 degrees Fahrenheit. One episode of vomiting yesterday. Patient states that she had a urological procedure performed March 22 at [facility]. This was as a treatment for interstitial cystitis. Several days after this she began having bladder spasms for which is taking a variety of medications including hyoscyamine, baclofen, Vicoprofen. Her urologist put her on Elmiron this past week. Patient thinks that she has a urinary tract infection. She denies ongoing white vaginal discharge for which she is taking Diflucan.  Patient is a 41 y.o. female presenting with dysuria. The history is provided by the patient.  Dysuria  This is a new problem. The current episode started yesterday. The problem occurs every urination. The problem has not changed since onset.There has been no fever. There is no history of pyelonephritis. Associated symptoms include nausea, vomiting and hematuria. Pertinent negatives include no frequency, no hesitancy, no urgency and no flank pain. She has tried nothing for the symptoms. Her past medical history is significant for urological procedure.    Past Medical History  Diagnosis Date  . Fibromyalgia   . Pancreatitis   . Asthma   . Interstitial cystitis   . Pelvic floor dysfunction   . Sinus infection     chronic  . Factor 5 Leiden mutation, heterozygous   . Fracture of 5th metatarsal 03/2011    left foot -    . Ovarian cyst     Past Surgical History  Procedure Date  .  Cystoscopy with hydrodistension and biopsy 04/21/2011    Dr Lorenz Coaster  . Laparoscopic nissen fundoplication 2007  . Cholecystectomy   . Tubal ligation 2010  . Wisdom tooth extraction     No family history on file.  History  Substance Use Topics  . Smoking status: Not on file  . Smokeless tobacco: Not on file  . Alcohol Use:     OB History    Grav Para Term Preterm Abortions TAB SAB Ect Mult Living                  Review of Systems  Constitutional: Positive for activity change and fatigue. Negative for fever.  HENT: Negative for sore throat and rhinorrhea.   Eyes: Negative for redness.  Respiratory: Negative for cough.   Cardiovascular: Negative for chest pain.  Gastrointestinal: Positive for nausea and vomiting. Negative for abdominal pain and diarrhea.  Genitourinary: Positive for dysuria, hematuria and vaginal discharge. Negative for hesitancy, urgency, frequency, flank pain and vaginal bleeding.  Musculoskeletal: Negative for myalgias.  Skin: Negative for rash.  Neurological: Positive for headaches.    Allergies  Methylene blue; Penicillins; Sulfonamide derivatives; Bromaline; and Other  Home Medications   Current Outpatient Rx  Name Route Sig Dispense Refill  . BACLOFEN 20 MG PO TABS Oral Take 20 mg by mouth 3 (three) times daily.    [name] CALCIUM CITRATE 250 MG PO TABS Oral Take 2 tablets by mouth.    . D3 ADULT PO Oral Take 2 tablets by mouth daily.    . GUAIFENESIN 400 MG PO  TABS Oral Take 400 mg by mouth 2 (two) times daily as needed. For congestion and mucous    . HYDROCODONE-IBUPROFEN 7.5-200 MG PO TABS Oral Take 1 tablet by mouth every 8 (eight) hours as needed. For pain    . HYDROXYZINE HCL 25 MG PO TABS Oral Take 25-50 mg by mouth at bedtime. Take 25 mg nightly for 10 days then take 25-50 mg one hour before sleep.    . ADULT MULTIVITAMIN W/MINERALS CH Oral Take 1 tablet by mouth daily.    [name] PENTOSAN POLYSULFATE SODIUM 100 MG PO CAPS Oral Take 200 mg by  mouth 2 (two) times daily.    [name] PSEUDOEPHEDRINE HCL 30 MG PO TABS Oral Take 30 mg by mouth every 4 (four) hours as needed. For sinus congestion    . SIMETHICONE 125 MG PO CHEW Oral Chew 125 mg by mouth every 6 (six) hours as needed. For gas    . TRIAMTERENE-HCTZ 37.5-25 MG PO CAPS Oral Take 1 capsule by mouth every morning.    [name] VALACYCLOVIR HCL 500 MG PO TABS Oral Take 500 mg by mouth daily.      BP 118/83  Pulse 103  Temp(Src) 98.2 F (36.8 C) (Oral)  Resp 18  SpO2 100%  LMP 04/24/2011  Physical Exam  Nursing note and vitals reviewed. Constitutional: She is oriented to person, place, and time. She appears well-developed and well-nourished.  HENT:  Head: Normocephalic and atraumatic.  Eyes: Conjunctivae are normal. Right eye exhibits no discharge. Left eye exhibits no discharge.  Neck: Normal range of motion. Neck supple.  Cardiovascular: Normal rate, regular rhythm and normal heart sounds.   Pulmonary/Chest: Effort normal and breath sounds normal.  Abdominal: Soft. Bowel sounds are normal. There is tenderness in the suprapubic area. There is no rigidity, no rebound, no guarding, no CVA tenderness, no tenderness at McBurney's point and negative Murphy's sign.  Musculoskeletal: She exhibits no edema and no tenderness.  Neurological: She is alert and oriented to person, place, and time.  Skin: Skin is warm and dry.  Psychiatric: She has a normal mood and affect.    ED Course  Procedures (including critical care time)  Labs Reviewed  URINALYSIS, ROUTINE W REFLEX MICROSCOPIC - Abnormal; Notable for the following:    APPearance CLOUDY (*)    Hgb urine dipstick LARGE (*)    Protein, ur 30 (*)    Nitrite POSITIVE (*)    Leukocytes, UA LARGE (*)    All other components within normal limits  CBC - Abnormal; Notable for the following:    WBC 12.7 (*)    All other components within normal limits  DIFFERENTIAL - Abnormal; Notable for the following:    Neutro Abs 8.5 (*)    All  other components within normal limits  BASIC METABOLIC PANEL - Abnormal; Notable for the following:    Potassium 3.2 (*)    All other components within normal limits  URINE MICROSCOPIC-ADD ON - Abnormal; Notable for the following:    Bacteria, UA MANY (*)    All other components within normal limits  PREGNANCY, URINE  URINE CULTURE   No results found.   1. Urinary tract infection     7:01 PM Patient seen and examined. Work-up reviewed. Appears likely that patient has urinary tract infection. Culture sent. Medications ordered.   Vital signs reviewed and are as follows: Filed Vitals:   05/13/11 1611  BP: 118/83  Pulse: 103  Temp: 98.2 F (36.8 C)  Resp: 18  Patient much improved after fluids, pain and nausea medication, and antibiotics. She is tolerating PO's. Patient requesting discharge. Patient urged to take entire course of antibiotics as directed. Counseled to use her home medications as directed. Urged to return with worsening pain, fever, persistent vomiting, or other concerns. Patient verbalizes understanding and agrees with the plan.   MDM  Patient with urinary tract infection. Do not suspect pyelonephritis at this point given the fever, vomiting, back pain. She is improved in emergency Department with treatment. Tachycardia improved. Seven-day course of Cipro given due to recent procedure. Patient has urology and primary care followup. Patient appears well and is stable at the time of discharge.       Renne Crigler, [place] 05/13/11 2250

## 2011-05-13 NOTE — ED Notes (Signed)
Pt reports that when she went to the bathroom earlier today her urine was very cloudy and when she wiped herself there was some blood. Pt reports this is day 21 and she never starts early. Pt has hx of fibromylagia and reports that her research on the internet has showed that this condition can sometimes cause uti symptoms without it being an uti. Pt has an appt this week with Dr Vincente Poli at [facility] to look at possible endometriosis or problems with her ovaries.

## 2011-05-13 NOTE — ED Notes (Signed)
Patient is AOx4 and comfortable with her discharge instructions. 

## 2011-05-13 NOTE — Discharge Instructions (Signed)
Please read and follow all provided instructions.  Your diagnoses today include:  1. Urinary tract infection     Tests performed today include:  Urine test that indicated a bladder infection  Blood tests  Vital signs. See below for your results today.   Medications prescribed:   Cipro - antibiotic that kills urinary tract infection  You have been prescribed an antibiotic medicine: take the entire course of medicine even if you are feeling better. Stopping early can cause the antibiotic not to work.  Take any prescribed medications only as directed.  Home care instructions:  Follow any educational materials contained in this packet.  Follow-up instructions: Please follow-up with your primary care provider in the next 3 days for further evaluation of your symptoms and a recheck of your urine. If you do not have a primary care doctor -- see below for referral information.   Return instructions:   Please return to the Emergency Department if you experience worsening symptoms.   Return with worsening pain, persistent vomiting, persistent fever, worsening abdominal pain.  Please return if you have any other emergent concerns.  Additional Information:  Your vital signs today were: BP 122/84  Pulse 75  Temp(Src) 98.2 F (36.8 C) (Oral)  Resp 18  SpO2 100%  LMP 04/24/2011 If your blood pressure (BP) was elevated above 135/85 this visit, please have this repeated by your doctor within one month. -------------- No Primary Care Doctor Call [facility]  [phone] Other agencies that provide inexpensive medical care    [facility]  [phone]    [facility]  [phone]    [facility]  [phone]    [facility]  [phone]    [facility]  [phone]    [facility]  [phone] -------------- RESOURCE GUIDE:  Dental Problems  Patients with Medicaid: [facility] [phone]  [address]                                            [phone] [address] Phone:  [phone]                                                   Phone:  [phone]  If unable to pay or uninsured, contact:  [facility] or [facility] to become qualified for the adult dental clinic.  Chronic Pain Problems Contact [facility]  [phone] Patients need to be referred by their primary care doctor.  Insufficient Money for Medicine Contact [employer] Way:  call "211" or [facility] [phone].  Psychological Services [facility]  [phone] [facility]  [phone] [facility]   [phone] (emergency services [phone])  Substance Abuse Resources [facility]  [phone] [facility] [phone] [facility] [phone] Floydene Flock [phone] Residential & Outpatient Substance Abuse Program  [phone]  Abuse/Neglect [facility] Child Abuse Hotline [phone] [facility] Child Abuse Hotline [phone] (After Hours)  Emergency Shelter [facility] [phone]  M[facility] [phone])  [phone] [employer] Services [phone]  [facility] Resources  [facility]     United Way                          [facility] Dept. [address] [place]                       [address]      [address]  Blondell Reveal Phone:  [phone]                                   Phone:  [phone]                 Phone:  [phone]  [facility] Phone:  [phone]  [facility] Child Abuse Hotline 2057113476 [phone] (After Hours)

## 2011-05-13 NOTE — ED Provider Notes (Signed)
Filed Vitals:   05/13/11 1611  BP: 118/83  Pulse: 103  Temp: 98.2 F (36.8 C)  Resp: 18     Pt has surgery at [facility] on March 22 by Dr. Marcelyn Bruins. She had some sort of bladder distention surgery. Since then she has been having headaches, bladder spasms, low back pain and sweat spells. She has been taking Hyoscyamine which she stopped taking and is now taking Elmiron PO and baclofen. She states that her urine is very cloudy with flakey white stuff in it and vaginal discharge.  I have ordered cbc, bmp, urinalysis and urine preg. Pt needs to be moved to exam room.  Dorthula Matas, PA 05/13/11 [phone]

## 2011-05-13 NOTE — ED Notes (Signed)
Patient complains of painful urination advises that urine is cloudy with a bad odor, times one week. Noticed blood in urine today, LPM 04/24/2011

## 2011-05-14 NOTE — ED Provider Notes (Signed)
Medical screening examination/treatment/procedure(s) were performed by non-physician practitioner and as supervising physician I was immediately available for consultation/collaboration.  Flint Melter, MD 05/14/11 [phone]

## 2011-05-14 NOTE — ED Provider Notes (Signed)
Medical screening examination/treatment/procedure(s) were performed by non-physician practitioner and as supervising physician I was immediately available for consultation/collaboration.  Juliet Rude. Rubin Payor, MD 05/14/11 1956

## 2011-05-23 ENCOUNTER — Ambulatory Visit: Payer: No Typology Code available for payment source | Admitting: Family Medicine

## 2011-06-12 ENCOUNTER — Inpatient Hospital Stay (HOSPITAL_COMMUNITY)
Admission: AD | Admit: 2011-06-12 | Discharge: 2011-06-12 | Disposition: A | Discharge status: Home / Self Care | Payer: Medicaid Other | Source: Ambulatory Visit | Attending: Obstetrics and Gynecology | Admitting: Obstetrics and Gynecology

## 2011-06-12 ENCOUNTER — Inpatient Hospital Stay (HOSPITAL_COMMUNITY): Payer: Medicaid Other

## 2011-06-12 ENCOUNTER — Encounter (HOSPITAL_COMMUNITY): Payer: Self-pay | Admitting: *Deleted

## 2011-06-12 DIAGNOSIS — IMO0001 Reserved for inherently not codable concepts without codable children: Secondary | ICD-10-CM | POA: Insufficient documentation

## 2011-06-12 DIAGNOSIS — R109 Unspecified abdominal pain: Secondary | ICD-10-CM | POA: Insufficient documentation

## 2011-06-12 DIAGNOSIS — N3010 Interstitial cystitis (chronic) without hematuria: Secondary | ICD-10-CM | POA: Insufficient documentation

## 2011-06-12 DIAGNOSIS — R102 Pelvic and perineal pain: Secondary | ICD-10-CM

## 2011-06-12 DIAGNOSIS — N949 Unspecified condition associated with female genital organs and menstrual cycle: Secondary | ICD-10-CM

## 2011-06-12 LAB — COMPREHENSIVE METABOLIC PANEL
AST: 19 U/L (ref 0–37)
Alkaline Phosphatase: 34 U/L — ABNORMAL LOW (ref 39–117)
BUN: 6 mg/dL (ref 6–23)
CO2: 26 mEq/L (ref 19–32)
Chloride: 102 mEq/L (ref 96–112)
Creatinine, Ser: 0.64 mg/dL (ref 0.50–1.10)
GFR calc non Af Amer: >90 mL/min (ref >90)
Total Bilirubin: 0.4 mg/dL (ref 0.3–1.2)

## 2011-06-12 LAB — URINALYSIS, ROUTINE W REFLEX MICROSCOPIC
Glucose, UA: NEGATIVE mg/dL
Leukocytes, UA: NEGATIVE
Nitrite: NEGATIVE
Protein, ur: NEGATIVE mg/dL
Urobilinogen, UA: 0.2 mg/dL (ref 0.0–1.0)

## 2011-06-12 LAB — POCT PREGNANCY, URINE: Preg Test, Ur: NEGATIVE

## 2011-06-12 LAB — CBC
HCT: 40.8 % (ref 36.0–46.0)
MCV: 90.5 fL (ref 78.0–100.0)
RBC: 4.51 MIL/uL (ref 3.87–5.11)
WBC: 7 10*3/uL (ref 4.0–10.5)

## 2011-06-12 MED ORDER — KETOROLAC TROMETHAMINE 60 MG/2ML IM SOLN
60.0000 mg | INTRAMUSCULAR | Status: AC
  Administered 2011-06-12: 60 mg via INTRAMUSCULAR
  Filled 2011-06-12: qty 2

## 2011-06-12 MED ORDER — HYDROCODONE-IBUPROFEN 7.5-200 MG PO TABS: 1.0000 | ORAL_TABLET | Freq: Three times a day (TID) | ORAL | Status: DC | PRN

## 2011-06-12 NOTE — Discharge Instructions (Signed)
Pelvic Pain Pelvic pain is pain below the belly button and located between your hips. Acute pain may last a few hours or days. Chronic pelvic pain may last weeks and months. The cause may be different for different types of pain. The pain may be dull or sharp, mild or severe and can interfere with your daily activities. Write down and tell your caregiver:   Exactly where the pain is located.   If it comes and goes or is there all the time.   When it happens (with sex, urination, bowel movement, etc.)   If the pain is related to your menstrual period or stress.  Your caregiver will take a full history and do a complete physical exam and Pap test. CAUSES   Painful menstrual periods (dysmenorrhea).   Normal ovulation (Mittelschmertz) that occurs in the middle of the menstrual cycle every month.   The pelvic organs get engorged with blood just before the menstrual period (pelvic congestive syndrome).   Scar tissue from an infection or past surgery (pelvic adhesions).   Cancer of the female pelvic organs. When there is pain with cancer, it has been there for a long time.   The lining of the uterus (endometrium) abnormally grows in places like the pelvis and on the pelvic organs (endometriosis).   A form of endometriosis with the lining of the uterus present inside of the muscle tissue of the uterus (adenomyosis).   Fibroid tumor (noncancerous) in the uterus.   Bladder problems such as infection, bladder spasms of the muscle tissue of the bladder.   Intestinal problems (irritable bowel syndrome, colitis, an ulcer or gastrointestinal infection).   Polyps of the cervix or uterus.   Pregnancy in the tube (ectopic pregnancy).   The opening of the cervix is too small for the menstrual blood to flow through it (cervical stenosis).   Physical or sexual abuse (past or present).   Musculo-skeletal problems from poor posture, problems with the vertebrae of the lower back or the uterine  pelvic muscles falling (prolapse).   Psychological problems such as depression or stress.   IUD (intrauterine device) in the uterus.  DIAGNOSIS  Tests to make a diagnosis depends on the type, location, severity and what causes the pain to occur. Tests that may be needed include:  Blood tests.   Urine tests   Ultrasound.   X-rays.   CT Scan.   MRI.   Laparoscopy.   Major surgery.  TREATMENT  Treatment will depend on the cause of the pain, which includes:  Prescription or over-the-counter pain medication.   Antibiotics.   Birth control pills.   Hormone treatment.   Nerve blocking injections.   Physical therapy.   Antidepressants.   Counseling with a psychiatrist or psychologist.   Minor or major surgery.  HOME CARE INSTRUCTIONS   Only take over-the-counter or prescription medicines for pain, discomfort or fever as directed by your caregiver.   Follow your caregiver's advice to treat your pain.   Rest.   Avoid sexual intercourse if it causes the pain.   Apply warm or cold compresses (which ever works best) to the pain area.   Do relaxation exercises such as yoga or meditation.   Try acupuncture.   Avoid stressful situations.   Try group therapy.   If the pain is because of a stomach/intestinal upset, drink clear liquids, eat a bland light food diet until the symptoms go away.  SEEK MEDICAL CARE IF:   You need stronger prescription pain medication.     You develop pain with sexual intercourse.   You have pain with urination.   You develop a temperature of 102 F (38.9 C) with the pain.   You are still in pain after 4 hours of taking prescription medication for the pain.   You need depression medication.   Your IUD is causing pain and you want it removed.  SEEK IMMEDIATE MEDICAL CARE IF:  You develop very severe pain or tenderness.   You faint, have chills, severe weakness or dehydration.   You develop heavy vaginal bleeding or passing  solid tissue.   You develop a temperature of 102 F (38.9 C) with the pain.   You have blood in the urine.   You are being physically or sexually abused.   You have uncontrolled vomiting and diarrhea.   You are depressed and afraid of harming yourself or someone else.  Document Released: 02/24/2004 Document Revised: 01/05/2011 Document Reviewed: 11/21/2007 [facility] Patient Information 2012 [place], [place].  Fibromyalgia Fibromyalgia is a disorder that is often misunderstood. It is associated with muscular pains and tenderness that comes and goes. It is often associated with fatigue and sleep disturbances. Though it tends to be long-lasting, fibromyalgia is not life-threatening. CAUSES  The exact cause of fibromyalgia is unknown. People with certain gene types are predisposed to developing fibromyalgia and other conditions. Certain factors can play a role as triggers, such as:  Spine disorders.   Arthritis.   Severe injury (trauma) and other physical stressors.   Emotional stressors.  SYMPTOMS   The main symptom is pain and stiffness in the muscles and joints, which can vary over time.   Sleep and fatigue problems.  Other related symptoms may include:  Bowel and bladder problems.   Headaches.   Visual problems.   Problems with odors and noises.   Depression or mood changes.   Painful periods (dysmenorrhea).   Dryness of the skin or eyes.  DIAGNOSIS  There are no specific tests for diagnosing fibromyalgia. Patients can be diagnosed accurately from the specific symptoms they have. The diagnosis is made by determining that nothing else is causing the problems. TREATMENT  There is no cure. Management includes medicines and an active, healthy lifestyle. The goal is to enhance physical fitness, decrease pain, and improve sleep. HOME CARE INSTRUCTIONS   Only take over-the-counter or prescription medicines as directed by your caregiver. Sleeping pills, tranquilizers, and  pain medicines may make your problems worse.   Low-impact aerobic exercise is very important and advised for treatment. At first, it may seem to make pain worse. Gradually increasing your tolerance will overcome this feeling.   Learning relaxation techniques and how to control stress will help you. Biofeedback, visual imagery, hypnosis, muscle relaxation, yoga, and meditation are all options.   Anti-inflammatory medicines and physical therapy may provide short-term help.   Acupuncture or massage treatments may help.   Take muscle relaxant medicines as suggested by your caregiver.   Avoid stressful situations.   Plan a healthy lifestyle. This includes your diet, sleep, rest, exercise, and friends.   Find and practice a hobby you enjoy.   Join a fibromyalgia support group for interaction, ideas, and sharing advice. This may be helpful.  SEEK MEDICAL CARE IF:  You are not having good results or improvement from your treatment. FOR MORE INFORMATION  National Fibromyalgia Association: www.fmaware.org Arthritis Foundation: www.arthritis.org Document Released: 01/16/2005 Document Revised: 01/05/2011 Document Reviewed: 04/28/2009 [facility] Patient Information 2012 [place], [place].

## 2011-06-12 NOTE — MAU Provider Note (Signed)
History     CSN: [id number]  Arrival date and time: 06/12/11 1011   First Provider Initiated Contact with Patient 06/12/11 1238      Chief Complaint  Patient presents with  . Back Pain  . Generalized Body Aches   HPI Pt presents to MAU with report of pelvic pain that starts low in her abdomen and radiates down into her vagina, and back pain.  This pain is not new but has increased in intensity today.  She is out of the pain medication she takes for fibromyalgia.  She reports general body aches, h/a, joint pain, and mucousy vaginal discharge and denies urinary frequency/dysuria, dizziness, n/v, vaginal bleeding/itching, or fever/chills.    OB History    Grav Para Term Preterm Abortions TAB SAB Ect Mult Living   4 4 4       4       Past Medical History  Diagnosis Date  . Fibromyalgia   . Pancreatitis   . Asthma   . Interstitial cystitis   . Pelvic floor dysfunction   . Sinus infection     chronic  . Factor 5 Leiden mutation, heterozygous   . Fracture of 5th metatarsal 03/2011    left foot -    . Ovarian cyst     Past Surgical History  Procedure Date  . Cystoscopy with hydrodistension and biopsy 04/21/2011    Dr Lorenz Coaster  . Laparoscopic nissen fundoplication 2007  . Cholecystectomy   . Tubal ligation 2010  . Wisdom tooth extraction     Family History  Problem Relation Age of Onset  . Anesthesia problems Neg Hx     History  Substance Use Topics  . Smoking status: Never Smoker   . Smokeless tobacco: Never Used  . Alcohol Use: No    Allergies:  Allergies  Allergen Reactions  . Methylene Blue Other (See Comments)    Caused inflamed pancreas, per pt should not use  . Penicillins Swelling  . Sulfonamide Derivatives Other (See Comments)    excrutiating pain, patient states it caused her to have osteoarthritis  . Bromaline (Albertsons Di Bromm) Rash  . Other Rash    Mango    Prescriptions prior to admission  Medication Sig Dispense Refill  .  baclofen (LIORESAL) 20 MG tablet Take 20 mg by mouth 3 (three) times daily.      . Calcium Citrate 250 MG TABS Take 2 tablets by mouth.      . Cholecalciferol (D3 ADULT PO) Take 2 tablets by mouth daily.      . Evening Primrose Oil 500 MG CAPS Take 1 capsule by mouth at bedtime.      [name] HYDROcodone-ibuprofen (VICOPROFEN) 7.5-200 MG per tablet Take 1 tablet by mouth every 8 (eight) hours as needed. For pain      . hydrOXYzine (ATARAX/VISTARIL) 25 MG tablet Take 25-50 mg by mouth at bedtime. Take 25 mg nightly for 10 days then take 25-50 mg one hour before sleep.      . Multiple Vitamin (MULITIVITAMIN WITH MINERALS) TABS Take 1 tablet by mouth daily.      . pentosan polysulfate (ELMIRON) 100 MG capsule Take 200 mg by mouth 2 (two) times daily.      . pseudoephedrine (SUDAFED) 30 MG tablet Take 30 mg by mouth every 4 (four) hours as needed. For sinus congestion      . simethicone (MYLICON) 125 MG chewable tablet Chew 125 mg by mouth every 6 (six) hours as needed. For gas      .  triamterene-hydrochlorothiazide (DYAZIDE) 37.5-25 MG per capsule Take 1 capsule by mouth every morning.      . valACYclovir (VALTREX) 500 MG tablet Take 500 mg by mouth at bedtime.       . vitamin B-12 (CYANOCOBALAMIN) 100 MCG tablet Take 50 mcg by mouth at bedtime.      . vitamin E 400 UNIT capsule Take 800 Units by mouth at bedtime.        Review of Systems  Constitutional: Positive for malaise/fatigue. Negative for fever and chills.  HENT: Positive for neck pain.   Eyes: Negative for blurred vision.  Respiratory: Negative for cough and shortness of breath.   Cardiovascular: Negative for chest pain.  Gastrointestinal: Positive for abdominal pain. Negative for heartburn, nausea and vomiting.  Genitourinary: Positive for dysuria. Negative for urgency and frequency.  Musculoskeletal: Positive for myalgias, back pain and joint pain.  Neurological: Positive for weakness and headaches. Negative for dizziness.    Psychiatric/Behavioral: Negative for depression.   Physical Exam   Blood pressure 129/87, pulse 90, temperature 99 F (37.2 C), temperature source Oral, resp. rate 20, height 5' 4.75" (1.645 m), weight 77.111 kg (170 lb), last menstrual period 05/20/2011, SpO2 100.00%.  Physical Exam  Nursing note and vitals reviewed. Constitutional: She is oriented to person, place, and time. She appears well-developed and well-nourished.  Neck: Normal range of motion.  Cardiovascular: Normal rate, regular rhythm and normal heart sounds.   Respiratory: Effort normal and breath sounds normal.  GI: Soft. Bowel sounds are normal. She exhibits no distension and no mass. There is tenderness. There is no rebound and no guarding.  Musculoskeletal: Normal range of motion.  Neurological: She is alert and oriented to person, place, and time.  Skin: Skin is warm and dry.  Psychiatric: She has a normal mood and affect. Her behavior is normal. Judgment and thought content normal.   Neg CVA tenderness  Pelvic exam: Cervix pink, visually closed, scant clear discharge Bimanual exam: Cervix 0/long/high, soft, anterior, neg CMT, uterus tender upon palpation, slightly enlarged, adnexa without enlargement or mass  MAU Course  Procedures U/A, CBC, CMP, Pelvic U/S Results for orders placed during the hospital encounter of 06/12/11 (from the past 24 hour(s))  URINALYSIS, ROUTINE W REFLEX MICROSCOPIC     Status: Abnormal   Collection Time   06/12/11 10:52 AM      Component Value Range   Color, Urine YELLOW  YELLOW    APPearance CLEAR  CLEAR    Specific Gravity, Urine <1.005 (*) 1.005 - 1.030    pH 6.5  5.0 - 8.0    Glucose, UA NEGATIVE  NEGATIVE (mg/dL)   Hgb urine dipstick NEGATIVE  NEGATIVE    Bilirubin Urine NEGATIVE  NEGATIVE    Ketones, ur NEGATIVE  NEGATIVE (mg/dL)   Protein, ur NEGATIVE  NEGATIVE (mg/dL)   Urobilinogen, UA 0.2  0.0 - 1.0 (mg/dL)   Nitrite NEGATIVE  NEGATIVE    Leukocytes, UA NEGATIVE   NEGATIVE   POCT PREGNANCY, URINE     Status: Normal   Collection Time   06/12/11 11:01 AM      Component Value Range   Preg Test, Ur NEGATIVE  NEGATIVE   CBC     Status: Normal   Collection Time   06/12/11 12:39 PM      Component Value Range   WBC 7.0  4.0 - 10.5 (K/uL)   RBC 4.51  3.87 - 5.11 (MIL/uL)   Hemoglobin 13.9  12.0 - 15.0 (g/dL)  HCT 40.8  36.0 - 46.0 (%)   MCV 90.5  78.0 - 100.0 (fL)   MCH 30.8  26.0 - 34.0 (pg)   MCHC 34.1  30.0 - 36.0 (g/dL)   RDW 16.1  09.6 - 04.5 (%)   Platelets 245  150 - 400 (K/uL)  COMPREHENSIVE METABOLIC PANEL     Status: Abnormal   Collection Time   06/12/11 12:39 PM      Component Value Range   Sodium 140  135 - 145 (mEq/L)   Potassium 3.6  3.5 - 5.1 (mEq/L)   Chloride 102  96 - 112 (mEq/L)   CO2 26  19 - 32 (mEq/L)   Glucose, Bld 94  70 - 99 (mg/dL)   BUN 6  6 - 23 (mg/dL)   Creatinine, Ser 4.09  0.50 - 1.10 (mg/dL)   Calcium 9.6  8.4 - 81.1 (mg/dL)   Total Protein 7.5  6.0 - 8.3 (g/dL)   Albumin 4.2  3.5 - 5.2 (g/dL)   AST 19  0 - 37 (U/L)   ALT 31  0 - 35 (U/L)   Alkaline Phosphatase 34 (*) 39 - 117 (U/L)   Total Bilirubin 0.4  0.3 - 1.2 (mg/dL)   GFR calc non Af Amer >90  >90 (mL/min)   GFR calc Af Amer >90  >90 (mL/min)  WET PREP, GENITAL     Status: Abnormal   Collection Time   06/12/11  3:05 PM      Component Value Range   Yeast Wet Prep HPF POC NONE SEEN  NONE SEEN    Trich, Wet Prep NONE SEEN  NONE SEEN    Clue Cells Wet Prep HPF POC NONE SEEN  NONE SEEN    WBC, Wet Prep HPF POC FEW (*) NONE SEEN    US Transvaginal Non-ob  06/12/2011  *RADIOLOGY REPORT*  Clinical Data: Right lower quadrant pain.  Right pelvic and back pain.  LMP 05/20/2011.  TRANSABDOMINAL AND TRANSVAGINAL ULTRASOUND OF PELVIS  Technique:  Both transabdominal and transvaginal ultrasound examinations of the pelvis were performed.  Transabdominal technique was performed for global imaging of the pelvis including uterus, ovaries, adnexal regions, and pelvic  cul-de-sac.  It was necessary to proceed with endovaginal exam following the transabdominal exam to visualize the endometrium and ovaries.  Comparison:  None.  Findings: Uterus:  7.8 x 5.0 x 5.4 cm.  Retroverted.  No fibroids or other uterine masses identified.  Endometrium: 11 mm double layer thickness measured transvaginally. Trace fluid noted in endometrial cavity.  No focal lesion visualized.  Right ovary: 1.9 x 1.6 x 1.2 cm.  Normal appearance.  Left ovary: 2.8 x 1.1 x 1.1 cm.  Normal appearance.  Other Findings:  No free fluid  IMPRESSION: Normal study.  No evidence of pelvic mass or other significant abnormality.  Original Report Authenticated By: Danae Orleans, M.D.   US Pelvis Complete  06/12/2011  *RADIOLOGY REPORT*  Clinical Data: Right lower quadrant pain.  Right pelvic and back pain.  LMP 05/20/2011.  TRANSABDOMINAL AND TRANSVAGINAL ULTRASOUND OF PELVIS  Technique:  Both transabdominal and transvaginal ultrasound examinations of the pelvis were performed.  Transabdominal technique was performed for global imaging of the pelvis including uterus, ovaries, adnexal regions, and pelvic cul-de-sac.  It was necessary to proceed with endovaginal exam following the transabdominal exam to visualize the endometrium and ovaries.  Comparison:  None.  Findings: Uterus:  7.8 x 5.0 x 5.4 cm.  Retroverted.  No fibroids or other uterine  masses identified.  Endometrium: 11 mm double layer thickness measured transvaginally. Trace fluid noted in endometrial cavity.  No focal lesion visualized.  Right ovary: 1.9 x 1.6 x 1.2 cm.  Normal appearance.  Left ovary: 2.8 x 1.1 x 1.1 cm.  Normal appearance.  Other Findings:  No free fluid  IMPRESSION: Normal study.  No evidence of pelvic mass or other significant abnormality.  Original Report Authenticated By: Danae Orleans, M.D.   Assessment and Plan  Pelvic pain Fibromyalgia Interstitial cystitis  Called Dr Henderson Cloud to review assessment and findings Toradol 60 mg IM x1  dose in MAU D/C home Vicoprofen prescription refilled x20 tabs Call Dr Vincente Poli in am  Return to MAU as needed  LEFTWICH-KIRBY, Nyzier Boivin 06/12/2011, 2:37 PM

## 2011-06-12 NOTE — MAU Note (Signed)
Pulse range from 90-123 on o2 sat.  Pt states pain always originates in pelvic region.  Last night felt like someone had hooks in back and was pulling her.

## 2011-06-12 NOTE — MAU Note (Signed)
Fri called office, [facility]  Tried over weekend to deal with it, but just can't.  April [name] was having pains like labor pain. Now is having pains on the lower rt side, one spot is real tender to touch. Back has been hurting.  Whole body hurts, has been having chills, if tries to do anything, she breaks out into a sweat.  Went on Fri to [facility] urology department (prior to calling office). Had therapy for pelvic floor dysfunction, it made her feel like she was having labor pains.  When she told them that, they said she did not have pelvic flood dysfunction.  Decided to just go home ( tried heating pad, linament) now knows she should have just come here. Back got really intense on the 9th  But it has not just let up.  Has been taking a lot of pain medicine- no relief... Took 12 ibuprofen yesterday (800mg  at a time), took hydrocodone 3 in the morning, again at 1300.[name] Now has run out.  Breasts have been hurting a lot, has talked to dr Vincente Poli, they have had some weird green discharge.  Has fibromyalgia- is in a flare

## 2011-06-22 ENCOUNTER — Other Ambulatory Visit: Payer: Self-pay | Admitting: Specialist

## 2011-06-22 ENCOUNTER — Ambulatory Visit
Admission: RE | Admit: 2011-06-22 | Discharge: 2011-06-22 | Disposition: A | Discharge status: Home / Self Care | Payer: Medicaid Other | Source: Ambulatory Visit | Attending: Specialist | Admitting: Specialist

## 2011-06-22 DIAGNOSIS — M545 Low back pain, unspecified: Secondary | ICD-10-CM

## 2011-08-08 ENCOUNTER — Encounter (HOSPITAL_COMMUNITY): Payer: Self-pay

## 2011-08-10 ENCOUNTER — Encounter (HOSPITAL_COMMUNITY): Payer: Self-pay

## 2011-08-10 ENCOUNTER — Encounter (HOSPITAL_COMMUNITY)
Admission: RE | Admit: 2011-08-10 | Discharge: 2011-08-10 | Disposition: A | Discharge status: Home / Self Care | Payer: Medicaid Other | Source: Ambulatory Visit | Attending: Obstetrics and Gynecology | Admitting: Obstetrics and Gynecology

## 2011-08-10 LAB — SURGICAL PCR SCREEN
MRSA, PCR: NEGATIVE
Staphylococcus aureus: NEGATIVE

## 2011-08-10 NOTE — Patient Instructions (Addendum)
[name]  08/10/2011   Your procedure is scheduled on:  08/21/11  Enter through the Main Entrance of [facility] at 6 AM.  Pick up the phone at the desk and dial [phone].   Call this number if you have problems the morning of surgery: [phone]   Remember:   Do not eat food:After Midnight.  Do not drink clear liquids: After Midnight.  Take these medicines the morning of surgery with A SIP OF WATER: NA   Do not wear jewelry, make-up or nail polish.  Do not wear lotions, powders, or perfumes. You may wear deodorant.  Do not shave 48 hours prior to surgery.  Do not bring valuables to the hospital.  Contacts, dentures or bridgework may not be worn into surgery.  Leave suitcase in the car. After surgery it may be brought to your room.  For patients admitted to the hospital, checkout time is 11:00 AM the day of discharge.   Patients discharged the day of surgery will not be allowed to drive home.  Name and phone number of your driver: NA  Special Instructions: CHG Shower Use Special Wash: 1/2 bottle night before surgery and 1/2 bottle morning of surgery.   Please read over the following fact sheets that you were given: MRSA Information

## 2011-08-10 NOTE — Pre-Procedure Instructions (Signed)
Pt stated [place] was "blood thinner" per Dr. Logan Bores. Confirmed with Jimmy/pharmacy. Pt advised to stop for 10days prior to surgery.

## 2011-08-10 NOTE — Pre-Procedure Instructions (Signed)
Pt history of Factor 5 reported to Dr Cristela Blue. No additional orders (pt not on anti-coagulants). Pt states Dr Vincente Poli is aware.

## 2011-08-21 ENCOUNTER — Ambulatory Visit (HOSPITAL_COMMUNITY): Payer: Medicaid Other | Admitting: Anesthesiology

## 2011-08-21 ENCOUNTER — Encounter (HOSPITAL_COMMUNITY): Payer: Self-pay | Admitting: Anesthesiology

## 2011-08-21 ENCOUNTER — Encounter (HOSPITAL_COMMUNITY): Payer: Self-pay

## 2011-08-21 ENCOUNTER — Encounter (HOSPITAL_COMMUNITY): Payer: Self-pay | Admitting: *Deleted

## 2011-08-21 ENCOUNTER — Encounter (HOSPITAL_COMMUNITY)
Admission: RE | Disposition: A | Discharge status: Home / Self Care | Payer: Self-pay | Source: Ambulatory Visit | Attending: Obstetrics and Gynecology

## 2011-08-21 ENCOUNTER — Inpatient Hospital Stay (HOSPITAL_COMMUNITY)
Admission: RE | Admit: 2011-08-21 | Discharge: 2011-08-23 | DRG: 742 | Disposition: A | Discharge status: Home / Self Care | Payer: Medicaid Other | Source: Ambulatory Visit | Attending: Obstetrics and Gynecology | Admitting: Obstetrics and Gynecology

## 2011-08-21 DIAGNOSIS — R102 Pelvic and perineal pain: Secondary | ICD-10-CM

## 2011-08-21 DIAGNOSIS — R109 Unspecified abdominal pain: Secondary | ICD-10-CM

## 2011-08-21 DIAGNOSIS — N949 Unspecified condition associated with female genital organs and menstrual cycle: Secondary | ICD-10-CM | POA: Diagnosis present

## 2011-08-21 DIAGNOSIS — IMO0001 Reserved for inherently not codable concepts without codable children: Secondary | ICD-10-CM | POA: Diagnosis present

## 2011-08-21 DIAGNOSIS — N946 Dysmenorrhea, unspecified: Principal | ICD-10-CM | POA: Diagnosis present

## 2011-08-21 DIAGNOSIS — N8000 Endometriosis of the uterus, unspecified: Secondary | ICD-10-CM | POA: Diagnosis present

## 2011-08-21 DIAGNOSIS — D6859 Other primary thrombophilia: Secondary | ICD-10-CM | POA: Diagnosis present

## 2011-08-21 DIAGNOSIS — N3010 Interstitial cystitis (chronic) without hematuria: Secondary | ICD-10-CM | POA: Diagnosis present

## 2011-08-21 HISTORY — PX: LAPAROSCOPIC ASSISTED VAGINAL HYSTERECTOMY: SHX5398

## 2011-08-21 HISTORY — PX: SALPINGOOPHORECTOMY: SHX82

## 2011-08-21 LAB — BASIC METABOLIC PANEL
Calcium: 9.9 mg/dL (ref 8.4–10.5)
Creatinine, Ser: 0.84 mg/dL (ref 0.50–1.10)
GFR calc Af Amer: >90 mL/min (ref >90)
GFR calc non Af Amer: 86 mL/min — ABNORMAL LOW (ref >90)

## 2011-08-21 LAB — CBC
Platelets: 289 10*3/uL (ref 150–400)
RDW: 13.6 % (ref 11.5–15.5)
WBC: 6.4 10*3/uL (ref 4.0–10.5)

## 2011-08-21 SURGERY — HYSTERECTOMY, VAGINAL, LAPAROSCOPY-ASSISTED
Anesthesia: General

## 2011-08-21 MED ORDER — DULOXETINE HCL 60 MG PO CPEP
60.0000 mg | ORAL_CAPSULE | Freq: Every day | ORAL | Status: DC
  Administered 2011-08-21 – 2011-08-22 (×2): 60 mg via ORAL
  Filled 2011-08-21 (×5): qty 1

## 2011-08-21 MED ORDER — ROCURONIUM BROMIDE 100 MG/10ML IV SOLN
INTRAVENOUS | Status: DC | PRN
  Administered 2011-08-21 (×2): 10 mg via INTRAVENOUS
  Administered 2011-08-21: 40 mg via INTRAVENOUS

## 2011-08-21 MED ORDER — DIAZEPAM 2 MG PO TABS
5.0000 mg | ORAL_TABLET | Freq: Every day | ORAL | Status: DC
  Administered 2011-08-21 – 2011-08-22 (×2): 5 mg via ORAL
  Filled 2011-08-21 (×2): qty 3

## 2011-08-21 MED ORDER — DIPHENHYDRAMINE HCL 50 MG/ML IJ SOLN: 12.5000 mg | Freq: Four times a day (QID) | INTRAMUSCULAR | Status: DC | PRN

## 2011-08-21 MED ORDER — HYDROMORPHONE HCL PF 1 MG/ML IJ SOLN
INTRAMUSCULAR | Status: AC
  Filled 2011-08-21: qty 1

## 2011-08-21 MED ORDER — DEXAMETHASONE SODIUM PHOSPHATE 4 MG/ML IJ SOLN
INTRAMUSCULAR | Status: DC | PRN
  Administered 2011-08-21: 8 mg via INTRAVENOUS

## 2011-08-21 MED ORDER — BUPIVACAINE HCL (PF) 0.25 % IJ SOLN
INTRAMUSCULAR | Status: AC
  Filled 2011-08-21: qty 30

## 2011-08-21 MED ORDER — VALACYCLOVIR HCL 500 MG PO TABS
500.0000 mg | ORAL_TABLET | Freq: Every day | ORAL | Status: DC
  Administered 2011-08-21 – 2011-08-22 (×2): 500 mg via ORAL
  Filled 2011-08-21 (×4): qty 1

## 2011-08-21 MED ORDER — PROPOFOL 10 MG/ML IV EMUL
INTRAVENOUS | Status: DC | PRN
  Administered 2011-08-21: 200 mg via INTRAVENOUS

## 2011-08-21 MED ORDER — FENTANYL CITRATE 0.05 MG/ML IJ SOLN
INTRAMUSCULAR | Status: DC | PRN
  Administered 2011-08-21: 150 ug via INTRAVENOUS
  Administered 2011-08-21: 100 ug via INTRAVENOUS

## 2011-08-21 MED ORDER — ONDANSETRON HCL 4 MG/2ML IJ SOLN
INTRAMUSCULAR | Status: DC | PRN
  Administered 2011-08-21: 4 mg via INTRAVENOUS

## 2011-08-21 MED ORDER — SCOPOLAMINE 1 MG/3DAYS TD PT72
1.0000 | MEDICATED_PATCH | TRANSDERMAL | Status: DC
  Administered 2011-08-21: 1.5 mg via TRANSDERMAL

## 2011-08-21 MED ORDER — MENTHOL 3 MG MT LOZG: 1.0000 | LOZENGE | OROMUCOSAL | Status: DC | PRN

## 2011-08-21 MED ORDER — ACETAMINOPHEN 10 MG/ML IV SOLN
1000.0000 mg | Freq: Once | INTRAVENOUS | Status: AC
  Administered 2011-08-21: 1000 mg via INTRAVENOUS
  Filled 2011-08-21: qty 100

## 2011-08-21 MED ORDER — PROPOFOL 10 MG/ML IV EMUL
INTRAVENOUS | Status: AC
  Filled 2011-08-21: qty 20

## 2011-08-21 MED ORDER — TEMAZEPAM 15 MG PO CAPS
15.0000 mg | ORAL_CAPSULE | Freq: Every evening | ORAL | Status: DC | PRN
  Administered 2011-08-22: 30 mg via ORAL
  Filled 2011-08-21: qty 2

## 2011-08-21 MED ORDER — ONDANSETRON HCL 4 MG/2ML IJ SOLN: 4.0000 mg | Freq: Four times a day (QID) | INTRAMUSCULAR | Status: DC | PRN

## 2011-08-21 MED ORDER — ONDANSETRON HCL 4 MG/2ML IJ SOLN
INTRAMUSCULAR | Status: AC
  Filled 2011-08-21: qty 2

## 2011-08-21 MED ORDER — HYDROXYZINE HCL 50 MG PO TABS
50.0000 mg | ORAL_TABLET | Freq: Every day | ORAL | Status: DC
  Administered 2011-08-21 – 2011-08-22 (×2): 50 mg via ORAL
  Filled 2011-08-21 (×3): qty 1

## 2011-08-21 MED ORDER — LIDOCAINE HCL (CARDIAC) 20 MG/ML IV SOLN
INTRAVENOUS | Status: DC | PRN
  Administered 2011-08-21: 100 mg via INTRAVENOUS

## 2011-08-21 MED ORDER — MIDAZOLAM HCL 5 MG/5ML IJ SOLN
INTRAMUSCULAR | Status: DC | PRN
  Administered 2011-08-21: 2 mg via INTRAVENOUS

## 2011-08-21 MED ORDER — SCOPOLAMINE 1 MG/3DAYS TD PT72
MEDICATED_PATCH | TRANSDERMAL | Status: AC
  Administered 2011-08-21: 1.5 mg via TRANSDERMAL
  Filled 2011-08-21: qty 1

## 2011-08-21 MED ORDER — ROCURONIUM BROMIDE 50 MG/5ML IV SOLN
INTRAVENOUS | Status: AC
  Filled 2011-08-21: qty 1

## 2011-08-21 MED ORDER — IBUPROFEN 600 MG PO TABS
600.0000 mg | ORAL_TABLET | Freq: Four times a day (QID) | ORAL | Status: DC | PRN
  Administered 2011-08-22 – 2011-08-23 (×3): 600 mg via ORAL
  Filled 2011-08-21 (×3): qty 1

## 2011-08-21 MED ORDER — CLINDAMYCIN PHOSPHATE 900 MG/50ML IV SOLN
900.0000 mg | INTRAVENOUS | Status: AC
  Administered 2011-08-21: 900 mg via INTRAVENOUS
  Filled 2011-08-21: qty 50

## 2011-08-21 MED ORDER — NEOSTIGMINE METHYLSULFATE 1 MG/ML IJ SOLN
INTRAMUSCULAR | Status: AC
  Filled 2011-08-21: qty 10

## 2011-08-21 MED ORDER — DEXTROSE IN LACTATED RINGERS 5 % IV SOLN
INTRAVENOUS | Status: DC
  Administered 2011-08-21 – 2011-08-22 (×3): via INTRAVENOUS

## 2011-08-21 MED ORDER — GLYCOPYRROLATE 0.2 MG/ML IJ SOLN
INTRAMUSCULAR | Status: DC | PRN
  Administered 2011-08-21: 0.4 mg via INTRAVENOUS

## 2011-08-21 MED ORDER — MIDAZOLAM HCL 2 MG/2ML IJ SOLN
INTRAMUSCULAR | Status: AC
  Filled 2011-08-21: qty 2

## 2011-08-21 MED ORDER — ALBUTEROL SULFATE HFA 108 (90 BASE) MCG/ACT IN AERS
INHALATION_SPRAY | RESPIRATORY_TRACT | Status: AC
  Filled 2011-08-21: qty 6.7

## 2011-08-21 MED ORDER — FENTANYL CITRATE 0.05 MG/ML IJ SOLN
INTRAMUSCULAR | Status: AC
  Filled 2011-08-21: qty 5

## 2011-08-21 MED ORDER — METOCLOPRAMIDE HCL 5 MG/ML IJ SOLN: 10.0000 mg | Freq: Once | INTRAMUSCULAR | Status: DC | PRN

## 2011-08-21 MED ORDER — ACETAMINOPHEN 10 MG/ML IV SOLN
1000.0000 mg | Freq: Four times a day (QID) | INTRAVENOUS | Status: DC
  Administered 2011-08-21 – 2011-08-22 (×3): 1000 mg via INTRAVENOUS
  Filled 2011-08-21 (×7): qty 100

## 2011-08-21 MED ORDER — LACTATED RINGERS IV SOLN
INTRAVENOUS | Status: DC
  Administered 2011-08-21: 125 mL/h via INTRAVENOUS
  Administered 2011-08-21: 08:00:00 via INTRAVENOUS

## 2011-08-21 MED ORDER — GLYCOPYRROLATE 0.2 MG/ML IJ SOLN
INTRAMUSCULAR | Status: AC
  Filled 2011-08-21: qty 1

## 2011-08-21 MED ORDER — HYDROMORPHONE HCL PF 1 MG/ML IJ SOLN
0.2500 mg | INTRAMUSCULAR | Status: DC | PRN
  Administered 2011-08-21 (×4): 0.5 mg via INTRAVENOUS

## 2011-08-21 MED ORDER — LACTATED RINGERS IV SOLN: INTRAVENOUS | Status: DC

## 2011-08-21 MED ORDER — HYDROMORPHONE HCL PF 1 MG/ML IJ SOLN
INTRAMUSCULAR | Status: AC
  Administered 2011-08-21: 0.5 mg via INTRAVENOUS
  Filled 2011-08-21: qty 1

## 2011-08-21 MED ORDER — KETOROLAC TROMETHAMINE 30 MG/ML IJ SOLN
30.0000 mg | Freq: Once | INTRAMUSCULAR | Status: AC
  Administered 2011-08-21: 30 mg via INTRAVENOUS
  Filled 2011-08-21: qty 1

## 2011-08-21 MED ORDER — NEOSTIGMINE METHYLSULFATE 1 MG/ML IJ SOLN
INTRAMUSCULAR | Status: DC | PRN
  Administered 2011-08-21: 3 mg via INTRAVENOUS

## 2011-08-21 MED ORDER — DIPHENHYDRAMINE HCL 12.5 MG/5ML PO ELIX
12.5000 mg | ORAL_SOLUTION | Freq: Four times a day (QID) | ORAL | Status: DC | PRN
  Filled 2011-08-21: qty 5

## 2011-08-21 MED ORDER — NALOXONE HCL 0.4 MG/ML IJ SOLN: 0.4000 mg | INTRAMUSCULAR | Status: DC | PRN

## 2011-08-21 MED ORDER — CIPROFLOXACIN IN D5W 400 MG/200ML IV SOLN
400.0000 mg | INTRAVENOUS | Status: AC
  Administered 2011-08-21: 400 mg via INTRAVENOUS
  Filled 2011-08-21: qty 200

## 2011-08-21 MED ORDER — TRAMADOL HCL 50 MG PO TABS
50.0000 mg | ORAL_TABLET | Freq: Four times a day (QID) | ORAL | Status: DC | PRN
  Administered 2011-08-22: 50 mg via ORAL
  Filled 2011-08-21 (×2): qty 1

## 2011-08-21 MED ORDER — SODIUM CHLORIDE 0.9 % IJ SOLN
INTRAMUSCULAR | Status: DC | PRN
  Administered 2011-08-21: 10 mL

## 2011-08-21 MED ORDER — MEPERIDINE HCL 25 MG/ML IJ SOLN: 6.2500 mg | INTRAMUSCULAR | Status: DC | PRN

## 2011-08-21 MED ORDER — HYDROMORPHONE HCL PF 1 MG/ML IJ SOLN
INTRAMUSCULAR | Status: DC | PRN
  Administered 2011-08-21: 2 mg via INTRAVENOUS
  Administered 2011-08-21: 1 mg via INTRAVENOUS

## 2011-08-21 MED ORDER — HYDROMORPHONE 0.3 MG/ML IV SOLN
INTRAVENOUS | Status: DC
  Administered 2011-08-21: 10:00:00 via INTRAVENOUS
  Administered 2011-08-21: 7 mL via INTRAVENOUS
  Administered 2011-08-21: 19:00:00 via INTRAVENOUS
  Administered 2011-08-21: 3.74 mg via INTRAVENOUS
  Administered 2011-08-21: 16 mL via INTRAVENOUS
  Administered 2011-08-22 (×2): 1.2 mg via INTRAVENOUS
  Filled 2011-08-21 (×2): qty 25

## 2011-08-21 MED ORDER — LIDOCAINE HCL (CARDIAC) 20 MG/ML IV SOLN
INTRAVENOUS | Status: AC
  Filled 2011-08-21: qty 5

## 2011-08-21 MED ORDER — SODIUM CHLORIDE 0.9 % IJ SOLN: 9.0000 mL | INTRAMUSCULAR | Status: DC | PRN

## 2011-08-21 MED ORDER — ALBUTEROL SULFATE HFA 108 (90 BASE) MCG/ACT IN AERS: 2.0000 | INHALATION_SPRAY | RESPIRATORY_TRACT | Status: DC | PRN

## 2011-08-21 MED ORDER — ALBUTEROL SULFATE HFA 108 (90 BASE) MCG/ACT IN AERS
INHALATION_SPRAY | RESPIRATORY_TRACT | Status: DC | PRN
  Administered 2011-08-21: 2 via RESPIRATORY_TRACT

## 2011-08-21 SURGICAL SUPPLY — 31 items
CHLORAPREP W/TINT 26ML (MISCELLANEOUS) ×3 IMPLANT
CLOTH BEACON ORANGE TIMEOUT ST (SAFETY) ×3 IMPLANT
CONT PATH 16OZ SNAP LID 3702 (MISCELLANEOUS) ×3 IMPLANT
COVER TABLE BACK 60X90 (DRAPES) ×3 IMPLANT
DECANTER SPIKE VIAL GLASS SM (MISCELLANEOUS) ×3 IMPLANT
DERMABOND ADVANCED (GAUZE/BANDAGES/DRESSINGS) ×1
DERMABOND ADVANCED .7 DNX12 (GAUZE/BANDAGES/DRESSINGS) ×2 IMPLANT
ELECT REM PT RETURN 9FT ADLT (ELECTROSURGICAL) ×3
ELECTRODE REM PT RTRN 9FT ADLT (ELECTROSURGICAL) ×2 IMPLANT
GLOVE BIO SURGEON STRL SZ 6.5 (GLOVE) ×9 IMPLANT
GLOVE BIOGEL PI IND STRL 6.5 (GLOVE) ×2 IMPLANT
GLOVE BIOGEL PI INDICATOR 6.5 (GLOVE) ×1
GOWN PREVENTION PLUS LG XLONG (DISPOSABLE) ×12 IMPLANT
NEEDLE INSUFFLATION 14GA 120MM (NEEDLE) ×3 IMPLANT
NS IRRIG 1000ML POUR BTL (IV SOLUTION) ×3 IMPLANT
PACK LAVH (CUSTOM PROCEDURE TRAY) ×3 IMPLANT
PROTECTOR NERVE ULNAR (MISCELLANEOUS) ×3 IMPLANT
SEALER TISSUE G2 CVD JAW 45CM (ENDOMECHANICALS) ×3 IMPLANT
SUT VIC AB 0 CT1 18XCR BRD8 (SUTURE) ×4 IMPLANT
SUT VIC AB 0 CT1 36 (SUTURE) ×3 IMPLANT
SUT VIC AB 0 CT1 8-18 (SUTURE) ×2
SUT VIC AB 3-0 PS2 18 (SUTURE) ×1
SUT VIC AB 3-0 PS2 18XBRD (SUTURE) ×2 IMPLANT
SUT VICRYL 0 TIES 12 18 (SUTURE) ×3 IMPLANT
SUT VICRYL 0 UR6 27IN ABS (SUTURE) ×3 IMPLANT
TOWEL OR 17X24 6PK STRL BLUE (TOWEL DISPOSABLE) ×6 IMPLANT
TRAY FOLEY CATH 14FR (SET/KITS/TRAYS/PACK) ×3 IMPLANT
TROCAR Z-THREAD BLADED 11X100M (TROCAR) ×3 IMPLANT
TROCAR Z-THREAD BLADED 5X100MM (TROCAR) ×3 IMPLANT
WARMER LAPAROSCOPE (MISCELLANEOUS) ×3 IMPLANT
WATER STERILE IRR 1000ML POUR (IV SOLUTION) ×3 IMPLANT

## 2011-08-21 NOTE — Brief Op Note (Signed)
08/21/2011  8:47 AM  PATIENT:  April [name]  41 y.o. female  PRE-OPERATIVE DIAGNOSIS:  CHRONIC PELVIC PAIN  POST-OPERATIVE DIAGNOSIS:  CHRONIC PELVIC PAIN  PROCEDURE:  Procedure(s) (LRB): LAPAROSCOPIC ASSISTED VAGINAL HYSTERECTOMY (N/A)   SURGEON:  Surgeon(s) and Role:    * Jeani Hawking, MD - Primary    * Meriel Pica, MD - Assisting  PHYSICIAN ASSISTANT:   ASSISTANTS: none   ANESTHESIA:   general  EBL:  Total I/O In: 1000 [I.V.:1000] Out: 550 [Urine:350; Blood:200]  BLOOD ADMINISTERED:none  DRAINS: Urinary Catheter (Foley)   LOCAL MEDICATIONS USED:  NONE  SPECIMEN:  Source of Specimen:  uterus and cervix  DISPOSITION OF SPECIMEN:  PATHOLOGY  COUNTS:  YES  TOURNIQUET:  * No tourniquets in log *  DICTATION: .Other Dictation: Dictation Number [id number]  PLAN OF CARE: Admit for overnight observation  PATIENT DISPOSITION:  PACU - hemodynamically stable.   Delay start of Pharmacological VTE agent (>24hrs) due to surgical blood loss or risk of bleeding: not applicable

## 2011-08-21 NOTE — H&P (Signed)
41 year old G 4 P 4 with history of BTL presents for LAVH. She has severe dysmenorrhea. She also has history of probably adenomyosis by ultrasound. Patient has known interstitial cystitis and her bladder spasms are much worse during her period.  Medical history Fibromyalgia Asthma Factor V Leiden Deficiency Heterozygous Interstitial Cystitis  Surgical History BTL Nissen Fundoplication  Lap Chole  Meds See List  ALLERGIES METHYLYENE BLUE PENICILLIN SULFA  Social history Married No tobacco No alcohol  Family history  Unremarkable  ROS As above Fatigue  Afebrile Vital signs stable General alert and oriented Lung CTAB Car RRR Abdomen is soft and non tender Pelvic  EGBUS is WNL Vagina is normal Cervix is normal Uterus is non tender Adnexae non tender  IMPRESSION: PELVIC PAIN Severe Dysmenorrhea  PLAN: Open Laparoscopic Assisted Vaginal Hysterectomy Risks reviewed with the patient Consented

## 2011-08-21 NOTE — Op Note (Signed)
April [name], April [name] NO.:  [id number]  MEDICAL RECORD NO.:  [id number]  LOCATION:  [facility]                          FACILITY:  [facility]  PHYSICIAN:  Bennetta Rudden L. Jasai Sorg, M.D.DATE OF BIRTH:  [birth year]  DATE OF PROCEDURE:  08/21/2011 DATE OF DISCHARGE:                              OPERATIVE REPORT   PREOPERATIVE DIAGNOSES:  Severe dysmenorrhea and pelvic pain.  POSTOPERATIVE DIAGNOSES:  Severe dysmenorrhea and pelvic pain, probable adenomyosis.  PROCEDURE:  Laparoscopic-assisted vaginal hysterectomy.  SURGEON:  Lidiya Reise L. Vincente Poli, MD.  ASSISTANTDuke Salvia. Marcelle Overlie, MD.  ANESTHESIA:  General.  ESTIMATED BLOOD LOSS:  200 mL.  COMPLICATIONS:  None.  DRAINS:  Foley.  PROCEDURE IN DETAIL:  The patient was taken to the operating room.  She was then intubated.  She was prepped and draped.  A Foley catheter was inserted.  A uterine manipulator was inserted.  Attention was turned to the abdomen where a scalpel was used to make an infraumbilical incision at the area of the previous scar.  The fascia was grasped with Allis clamps.  We entered the fascia with Mayo scissors.  Then, I was able to use the hemostat, entered the peritoneum, and placed angle stitches using 0 Vicryl suture, placed a Hasson trocar and then placed and then performed pneumoperitoneum.  The laparoscoped was introduced into the trocar sheath.  The patient was placed in Trendelenburg position.  There were no adhesions noted.  Of course, the patient had had previous surgery.  Filshie clips were noted on both tubes.  The uterus was retroverted and slightly enlarged and boggy consistent with adenomyosis. The adnexa were normal.  No endometriosis and no adhesions were noted. I then placed a 5-mm port suprapubically under direct visualization, grasped the triple pedicle on the right side, placed the enseal across the triple pedicle and burned that and then cut it and carried it down to the round  ligament on the right.  This was done on the left side in identical fashion.  With careful attention to maintain a very good hemostasis.  At this point, we then removed the scope and reduced the pneumoperitoneum, placed a weighted speculum in the vagina, made a circumferential incision around the cervix with the Bovie, entered the posterior cul-de-sac using Mayo scissors, and entered the anterior cul- de-sac using Metzenbaum scissors, used curved Heaney clamps to clamp across the uterosacral cardinal ligaments on either side.  Each pedicle was clamped, cut, and suture ligated using 0 Vicryl suture.  Once we walked our way up the broad ligament, each pedicle was clamped, cut, and suture ligated using 0 Vicryl suture.  We then reached the level of the fundus.  The uterus was retroflexed.  The remainder of the broad ligament was clamped on either side.  Each pedicle was clamped, cut, and suture ligated using 0 Vicryl suture.  The uterus was removed and identified as cervix and uterus.  The posterior cuff was closed using 0 Vicryl running locked stitch.  Hemostasis was excellent.  We then closed the cuff completely anterior to posterior using 0 Vicryl suture.  I then changed my gloves.  I went back up to the abdomen and replaced  the pneumoperitoneum, irrigated the pelvis.  The pelvis was hemostatic.  The trocars were removed.  The angle stitches were tied down to close the fascial defect, 3-0 was used to close the subcuticular both sides and Dermabond was then applied.  All sponge, lap, and instrument counts were correct x2.  The bladder was draining clear urine at the end of the case, and the patient was extubated and went to recovery room in stable condition.     Shimshon Narula L. Vincente Poli, M.D.     Florestine Avers  D:  08/21/2011  T:  08/21/2011  Job:  [id number]

## 2011-08-21 NOTE — Transfer of Care (Signed)
Immediate Anesthesia Transfer of Care Note  Patient: April [name]  Procedure(s) Performed: Procedure(s) (LRB): LAPAROSCOPIC ASSISTED VAGINAL HYSTERECTOMY (N/A) SALPINGO OOPHERECTOMY (Bilateral)  Patient Location: PACU  Anesthesia Type: General  Level of Consciousness: awake, alert  and oriented  Airway & Oxygen Therapy: Patient Spontanous Breathing and Patient connected to nasal cannula oxygen  Post-op Assessment: Report given to PACU RN and Post -op Vital signs reviewed and stable  Post vital signs: stable  Complications: No apparent anesthesia complications

## 2011-08-21 NOTE — Addendum Note (Signed)
Addendum  created 08/21/11 1610 by Suella Grove, CRNA   Modules edited:Notes Section

## 2011-08-21 NOTE — Anesthesia Postprocedure Evaluation (Signed)
  Anesthesia Post-op Note  Patient: April [name]  Procedure(s) Performed: Procedure(s) (LRB): LAPAROSCOPIC ASSISTED VAGINAL HYSTERECTOMY (N/A) SALPINGO OOPHERECTOMY (Bilateral)  Patient Location: [facility]  Anesthesia Type: General  Level of Consciousness: awake, alert , oriented and patient cooperative  Airway and Oxygen Therapy: Patient Spontanous Breathing  Post-op Pain: none  Post-op Assessment: Patient's Cardiovascular Status Stable and Respiratory Function Stable  Post-op Vital Signs: Reviewed and stable  Complications: No apparent anesthesia complications

## 2011-08-21 NOTE — Anesthesia Postprocedure Evaluation (Signed)
Anesthesia Post Note  Patient: April [name]  Procedure(s) Performed: Procedure(s) (LRB): LAPAROSCOPIC ASSISTED VAGINAL HYSTERECTOMY (N/A) SALPINGO OOPHERECTOMY (Bilateral)  Anesthesia type: GA  Patient location: PACU  Post pain: Pain level controlled  Post assessment: Post-op Vital signs reviewed  Last Vitals:  Filed Vitals:   08/21/11 0945  BP: 125/69  Pulse: 100  Temp: 36.8 C  Resp: 14    Post vital signs: Reviewed  Level of consciousness: sedated  Complications: No apparent anesthesia complications

## 2011-08-21 NOTE — Anesthesia Preprocedure Evaluation (Addendum)
Anesthesia Evaluation    Airway Mallampati: III TM Distance: >3 FB Neck ROM: Full    Dental No notable dental hx. (+) Teeth Intact   Pulmonary asthma ,  breath sounds clear to auscultation  Pulmonary exam normal       Cardiovascular negative cardio ROS  Rhythm:Regular Rate:Normal     Neuro/Psych negative neurological ROS  negative psych ROS   GI/Hepatic Hx/o Pancreatitis   Endo/Other    Renal/GU    Interstitial Cystitis    Musculoskeletal  (+) Fibromyalgia -, narcotic dependent  Abdominal   Peds  Hematology Factor V Leiden Deficiency Heterozygous   Anesthesia Other Findings   Reproductive/Obstetrics                         Anesthesia Physical Anesthesia Plan  ASA: II  Anesthesia Plan: General   Post-op Pain Management:    Induction: Intravenous  Airway Management Planned: Oral ETT  Additional Equipment:   Intra-op Plan:   Post-operative Plan: Extubation in OR  Informed Consent: I have reviewed the patients History and Physical, chart, labs and discussed the procedure including the risks, benefits and alternatives for the proposed anesthesia with the patient or authorized representative who has indicated his/her understanding and acceptance.   Dental advisory given  Plan Discussed with: CRNA, Anesthesiologist and Surgeon  Anesthesia Plan Comments:         Anesthesia Quick Evaluation

## 2011-08-21 NOTE — Progress Notes (Signed)
Pt continues to complain of pain at 8 painad scale 0 at present, talkative about medical history, vss, spoke with Dr Jean Rosenthal regarding pain orders received, discussed pts chronic pain medication hx, pt will be on dilaudid pca on the floor, will discharge to room

## 2011-08-22 ENCOUNTER — Encounter (HOSPITAL_COMMUNITY): Payer: Self-pay | Admitting: Obstetrics and Gynecology

## 2011-08-22 LAB — CBC
Platelets: 226 10*3/uL (ref 150–400)
RBC: 3.22 MIL/uL — ABNORMAL LOW (ref 3.87–5.11)
RDW: 13.9 % (ref 11.5–15.5)
WBC: 7 10*3/uL (ref 4.0–10.5)

## 2011-08-22 LAB — BASIC METABOLIC PANEL
CO2: 31 mEq/L (ref 19–32)
Chloride: 103 mEq/L (ref 96–112)
GFR calc Af Amer: >90 mL/min (ref >90)
Sodium: 139 mEq/L (ref 135–145)

## 2011-08-22 MED ORDER — HYDROMORPHONE HCL 2 MG PO TABS
4.0000 mg | ORAL_TABLET | ORAL | Status: DC | PRN
  Administered 2011-08-22 – 2011-08-23 (×6): 4 mg via ORAL
  Filled 2011-08-22 (×6): qty 2

## 2011-08-22 MED ORDER — LORAZEPAM 1 MG PO TABS
2.0000 mg | ORAL_TABLET | ORAL | Status: DC | PRN
  Administered 2011-08-22 (×2): 2 mg via ORAL
  Filled 2011-08-22 (×2): qty 1
  Filled 2011-08-22: qty 2

## 2011-08-22 MED ORDER — OXYBUTYNIN CHLORIDE 5 MG PO TABS
10.0000 mg | ORAL_TABLET | Freq: Three times a day (TID) | ORAL | Status: DC
  Administered 2011-08-22 (×2): 10 mg via ORAL
  Filled 2011-08-22 (×6): qty 2

## 2011-08-22 MED ORDER — OXYCODONE-ACETAMINOPHEN 5-325 MG PO TABS
2.0000 | ORAL_TABLET | ORAL | Status: DC | PRN
  Administered 2011-08-22 (×2): 2 via ORAL
  Filled 2011-08-22 (×2): qty 2

## 2011-08-22 NOTE — Progress Notes (Signed)
1 Day Post-Op Procedure(s) (LRB): LAPAROSCOPIC ASSISTED VAGINAL HYSTERECTOMY (N/A) SALPINGO OOPHERECTOMY (Bilateral)  Subjective: Patient reports incisional pain, tolerating PO and + flatus.    Objective: I have reviewed patient's vital signs, intake and output, medications and labs.  General: alert, cooperative and appears stated age Extremities: extremities normal, atraumatic, no cyanosis or edema Vaginal Bleeding: none Abdomen is soft and nontender and benign  Assessment: s/p Procedure(s) (LRB): LAPAROSCOPIC ASSISTED VAGINAL HYSTERECTOMY (N/A) SALPINGO OOPHERECTOMY (Bilateral): stable, progressing well and tolerating diet  Plan: Advance diet Encourage ambulation Advance to PO medication Discontinue IV fluids Consider discharge home after lunch - I will reevaluate  LOS: 1 day    April [name] L 08/22/2011, 7:04 AM

## 2011-08-22 NOTE — Progress Notes (Signed)
Patient is crying. Percocet not working. Abdomen is soft and non tender Incision clean dry and intact Will admit patient - plan discharge tomorrow Change to po dilaudid Ditropan for possible bladder spasms Ativan for anxiety Plan of care discussed with her nurse

## 2011-08-22 NOTE — Care Management Note (Signed)
    Page 1 of 1   08/22/2011     10:49:22 AM   CARE MANAGEMENT NOTE 08/22/2011  Patient:  April [name], April [name]   Account Number:  [id number]  Date Initiated:  08/22/2011  Documentation initiated by:  Hoy Finlay  Subjective/Objective Assessment:   s/p LAVH  history of interstitial cystitis and fibromyalgia     Anticipated DC Date:  08/22/2011   Anticipated DC Plan:  HOME/SELF CARE  Comments:  08/22/11 09:45 H. Montez Morita, RN, BSN- Received referral for possible medication assistance for this patient who is status-post LAVH on 08/21/11 and is for possible discharge later today. CM verified with Burman Foster, Artist, that patient has active Medicaid including medication coverage. CM then spoke with patient who reports that she has a $3 copay for her medications which are otherwise covered by Medicaid. CM explained to patient that she does not qualify for medication assistance due to her active Medicaid status. Patient acknowledged understanding and denies other discharge needs. Please contact RN case manager with further questions prior to discharge at [phone].

## 2011-08-23 MED ORDER — HYDROCODONE-IBUPROFEN 7.5-200 MG PO TABS: 2.0000 | ORAL_TABLET | ORAL | Status: DC | PRN

## 2011-08-23 MED ORDER — OXYCODONE-ACETAMINOPHEN 5-325 MG PO TABS: 2.0000 | ORAL_TABLET | ORAL | Status: AC | PRN

## 2011-08-23 MED ORDER — OXYBUTYNIN CHLORIDE 5 MG PO TABS: 10.0000 mg | ORAL_TABLET | Freq: Three times a day (TID) | ORAL | Status: DC

## 2011-08-23 NOTE — Progress Notes (Signed)
2 Days Post-Op Procedure(s) (LRB): LAPAROSCOPIC ASSISTED VAGINAL HYSTERECTOMY (N/A) SALPINGO OOPHERECTOMY (Bilateral)  Subjective: Patient reports incisional pain, tolerating PO, + flatus, + BM and no problems voiding.    Objective: I have reviewed patient's vital signs, intake and output and medications.  General: alert, cooperative and appears stated age Resp: clear to auscultation bilaterally Vaginal Bleeding: none Abdomen is soft and non tender and benign  Assessment: s/p Procedure(s) (LRB): LAPAROSCOPIC ASSISTED VAGINAL HYSTERECTOMY (N/A) SALPINGO OOPHERECTOMY (Bilateral): stable, progressing well and tolerating diet  Plan: Discharge home Rx percocet, vicoprofen and ditropan Follow up in 2 weeks  LOS: 2 days    Melvine Julin L 08/23/2011, 8:10 AM

## 2011-08-23 NOTE — Progress Notes (Signed)
Pt is discharged in the care of Mother, downstairs per ambulatory. Abdominal operative site is clean and dry. No excessive vaginal bleeding. States pain as improved . All discharged instructions were understood well.  Questions were asked and answered.

## 2011-08-23 NOTE — Discharge Summary (Signed)
Admission diagnosis: Pelvic Pain Dysmenorrhea Fibromyalgia Interstitial cystitis  Discharge Diagnosis: Same  Hospital Course: 41 year old female with chronic pain and severe dysmenorrhea. On day of surgery, probably adenomyosis noted. Post op course significant for pain control and anxiety that required 1 additional day of hospitalization. By POD #2, she was feeling much better and was discharged home. She was ambulating, voiding and tolerating a regular diet. She had will be sent home with Percocet and with vicoprofen to use prn pain. She will follow up in the office in 2 weeks.  No driving for 1 week. No intercourse for 6 weeks.

## 2011-11-07 ENCOUNTER — Encounter (HOSPITAL_COMMUNITY): Payer: Self-pay

## 2011-11-18 ENCOUNTER — Encounter (HOSPITAL_COMMUNITY): Payer: Self-pay

## 2011-11-18 ENCOUNTER — Emergency Department (HOSPITAL_COMMUNITY)
Admission: EM | Admit: 2011-11-18 | Discharge: 2011-11-19 | Disposition: A | Discharge status: Home / Self Care | Payer: Medicaid Other | Attending: Emergency Medicine | Admitting: Emergency Medicine

## 2011-11-18 DIAGNOSIS — N949 Unspecified condition associated with female genital organs and menstrual cycle: Secondary | ICD-10-CM | POA: Insufficient documentation

## 2011-11-18 DIAGNOSIS — R10819 Abdominal tenderness, unspecified site: Secondary | ICD-10-CM | POA: Insufficient documentation

## 2011-11-18 DIAGNOSIS — N390 Urinary tract infection, site not specified: Secondary | ICD-10-CM

## 2011-11-18 LAB — URINALYSIS, ROUTINE W REFLEX MICROSCOPIC
Bilirubin Urine: NEGATIVE
Hgb urine dipstick: NEGATIVE
Ketones, ur: NEGATIVE mg/dL
Nitrite: NEGATIVE
Protein, ur: NEGATIVE mg/dL
Specific Gravity, Urine: 1.019 (ref 1.005–1.030)
Urobilinogen, UA: 0.2 mg/dL (ref 0.0–1.0)

## 2011-11-18 LAB — COMPREHENSIVE METABOLIC PANEL
ALT: 15 U/L (ref 0–35)
AST: 14 U/L (ref 0–37)
CO2: 32 mEq/L (ref 19–32)
Calcium: 9.8 mg/dL (ref 8.4–10.5)
Chloride: 98 mEq/L (ref 96–112)
GFR calc Af Amer: >90 mL/min (ref >90)
GFR calc non Af Amer: 85 mL/min — ABNORMAL LOW (ref >90)
Glucose, Bld: 94 mg/dL (ref 70–99)
Sodium: 137 mEq/L (ref 135–145)
Total Bilirubin: 0.1 mg/dL — ABNORMAL LOW (ref 0.3–1.2)

## 2011-11-18 LAB — CBC WITH DIFFERENTIAL/PLATELET
Basophils Absolute: 0 10*3/uL (ref 0.0–0.1)
Eosinophils Relative: 2 % (ref 0–5)
HCT: 38.7 % (ref 36.0–46.0)
Lymphocytes Relative: 28 % (ref 12–46)
Lymphs Abs: 2.2 10*3/uL (ref 0.7–4.0)
MCV: 88.8 fL (ref 78.0–100.0)
Monocytes Absolute: 0.7 10*3/uL (ref 0.1–1.0)
Neutro Abs: 4.7 10*3/uL (ref 1.7–7.7)
Platelets: 286 10*3/uL (ref 150–400)
RBC: 4.36 MIL/uL (ref 3.87–5.11)
WBC: 7.9 10*3/uL (ref 4.0–10.5)

## 2011-11-18 LAB — WET PREP, GENITAL: Clue Cells Wet Prep HPF POC: NONE SEEN

## 2011-11-18 LAB — URINE MICROSCOPIC-ADD ON

## 2011-11-18 MED ORDER — DEXTROSE 5 % IV SOLN
1.0000 g | Freq: Once | INTRAVENOUS | Status: AC
  Administered 2011-11-18: 1 g via INTRAVENOUS
  Filled 2011-11-18: qty 10

## 2011-11-18 MED ORDER — OXYCODONE-ACETAMINOPHEN 5-325 MG PO TABS
2.0000 | ORAL_TABLET | Freq: Once | ORAL | Status: AC
  Administered 2011-11-18: 2 via ORAL
  Filled 2011-11-18: qty 2

## 2011-11-18 MED ORDER — SODIUM CHLORIDE 0.9 % IV BOLUS (SEPSIS)
1000.0000 mL | Freq: Once | INTRAVENOUS | Status: AC
  Administered 2011-11-18: 1000 mL via INTRAVENOUS

## 2011-11-18 MED ORDER — MORPHINE SULFATE 4 MG/ML IJ SOLN
4.0000 mg | Freq: Once | INTRAMUSCULAR | Status: AC
  Administered 2011-11-18: 4 mg via INTRAVENOUS
  Filled 2011-11-18: qty 1

## 2011-11-18 NOTE — ED Notes (Signed)
Pt presented to ED with UTI.Pt also complains of lower back pain and abdominal cramping ,sweating and chills.

## 2011-11-18 NOTE — ED Notes (Signed)
Pt reports lower back pain, bilateral leg pain, pelvic pain, and burning w.urination. Pt reports seeing her pcp and urologist last week, pt reports a hx of cystitis.

## 2011-11-18 NOTE — ED Provider Notes (Signed)
History     CSN: [id number]  Arrival date & time 11/18/11  1811   First MD Initiated Contact with Patient 11/18/11 2036      Chief Complaint  Patient presents with  . Urinary Tract Infection    (Consider location/radiation/quality/duration/timing/severity/associated sxs/prior treatment) HPI Comments: April [name] 41 y.o. female   The chief complaint is: Patient presents with:   Urinary Tract Infection   Patient with complicated PMH including Interstitial cystitis c/o UTI.  Patient is followed at [facility].  States that she has excruciating pelvic pain and feels like a "bowling ball" between her legs.  She  The onset of the symptoms was  gradual starting 3 days ago,  The Course is  constant, gradually worsened nothing makes symptoms worse, narcotics makes symptoms better Has associated dysuria, frequency,urgency. Denies fevers, chills, myalgias, arthralgias. Denies DOE, SOB, chest tightness or pressure, radiation to left arm, jaw or back, or diaphoresis. Denies  flank pain, or hematuria. Denies headaches, light headedness, weakness, visual disturbances. Denies abdominal pain, nausea, vomiting, diarrhea or constipation.       The history is provided by the patient. No language interpreter was used.    Past Medical History  Diagnosis Date  . Fibromyalgia   . Pancreatitis   . Interstitial cystitis   . Pelvic floor dysfunction   . Sinus infection     chronic  . Factor 5 Leiden mutation, heterozygous   . Fracture of 5th metatarsal 03/2011    left foot -    . Ovarian cyst   . Asthma     no inhaler    Past Surgical History  Procedure Date  . Cystoscopy with hydrodistension and biopsy 04/21/2011    Dr Lorenz Coaster  . Laparoscopic nissen fundoplication 2007  . Cholecystectomy   . Tubal ligation 2010  . Wisdom tooth extraction   . Laparoscopic assisted vaginal hysterectomy 08/21/2011    Procedure: LAPAROSCOPIC ASSISTED VAGINAL HYSTERECTOMY;  Surgeon:  Jeani Hawking, MD;  Location: [facility];  Service: Gynecology;  Laterality: N/A;  OPEN LAPAROSCOPIC  . Salpingoophorectomy 08/21/2011    Procedure: SALPINGO OOPHERECTOMY;  Surgeon: Jeani Hawking, MD;  Location: [facility];  Service: Gynecology;  Laterality: Bilateral;  . Abdominal hysterectomy     Family History  Problem Relation Age of Onset  . Anesthesia problems Neg Hx     History  Substance Use Topics  . Smoking status: Never Smoker   . Smokeless tobacco: Never Used  . Alcohol Use: No    OB History    Grav Para Term Preterm Abortions TAB SAB Ect Mult Living   4 4 4       4       Review of Systems  Constitutional: Negative for fever and chills.  HENT: Negative for trouble swallowing.   Respiratory: Negative for shortness of breath.   Cardiovascular: Negative for chest pain.  Gastrointestinal: Negative for nausea, vomiting, abdominal pain, diarrhea and constipation.  Genitourinary: Positive for dysuria, frequency, vaginal discharge and pelvic pain. Negative for hematuria, flank pain, decreased urine volume, vaginal bleeding and vaginal pain.  Musculoskeletal: Negative for myalgias and arthralgias.  Skin: Negative for rash.  Neurological: Negative for numbness.  All other systems reviewed and are negative.    Allergies  Methylene blue; Penicillins; Sulfonamide derivatives; Bromaline; and Other  Home Medications   Current Outpatient Rx  Name Route Sig Dispense Refill  . ALBUTEROL SULFATE HFA 108 (90 BASE) MCG/ACT IN AERS Inhalation Inhale 2 puffs into the lungs as  needed.    [name] BACLOFEN 20 MG PO TABS Oral Take 20 mg by mouth 3 (three) times daily.    [name] CALCIUM CITRATE 250 MG PO TABS Oral Take 2 tablets by mouth daily.     . CYANOCOBALAMIN 5000 MCG SL SUBL Sublingual Place 1 tablet under the tongue daily.    [name] DIAZEPAM 5 MG PO TABS Oral Take 5 mg by mouth at bedtime.    . DULOXETINE HCL 60 MG PO CPEP Oral Take 60 mg by mouth daily.    [name] EVENING PRIMROSE OIL 1000 MG PO  CAPS Oral Take 1,000 mg by mouth daily.    [name] HYDROCODONE-IBUPROFEN 7.5-200 MG PO TABS Oral Take 2 tablets by mouth every 4 (four) hours as needed for pain. For pain 60 tablet 0  . HYDROXYZINE HCL 50 MG PO TABS Oral Take 50 mg by mouth daily at 10 pm.    . IBUPROFEN 200 MG PO TABS Oral Take 400 mg by mouth every 6 (six) hours. Scheduled along with vicoprofen    . ADULT MULTIVITAMIN W/MINERALS CH Oral Take 1 tablet by mouth daily.    [name] OVER THE COUNTER MEDICATION Oral Take 1 capsule by mouth daily. Mega D3 (5,000 units D3/200mg  red wine complex/Jap Nut Weed extract-resveritrol)    . OXYBUTYNIN CHLORIDE 5 MG PO TABS Oral Take 2 tablets (10 mg total) by mouth 3 (three) times daily. 60 tablet 0  . PENTOSAN POLYSULFATE SODIUM 100 MG PO CAPS Oral Take 200 mg by mouth 2 (two) times daily.    . ACIDOPHILUS HIGH-POTENCY PO CAPS Oral Take 2 capsules by mouth daily. 2 caps = 10 billion units    . PSEUDOEPHEDRINE HCL 30 MG PO TABS Oral Take 60 mg by mouth 4 (four) times daily. scheduled    . SIMETHICONE 125 MG PO CHEW Oral Chew 125 mg by mouth 4 (four) times daily. scheduled    . TRIAMTERENE-HCTZ 37.5-25 MG PO CAPS Oral Take 1 capsule by mouth every morning.    [name] VALACYCLOVIR HCL 500 MG PO TABS Oral Take 500 mg by mouth at bedtime.     [name] VITAMIN E 400 UNITS PO CAPS Oral Take 800 Units by mouth at bedtime.      BP 100/69  Pulse 86  Temp 98.4 F (36.9 C) (Oral)  Resp 20  SpO2 100%  LMP 05/20/2011  Physical Exam  Nursing note and vitals reviewed. Constitutional: She is oriented to person, place, and time. She appears well-developed and well-nourished. No distress.  HENT:  Head: Normocephalic and atraumatic.  Eyes: Conjunctivae normal are normal. No scleral icterus.  Neck: Normal range of motion.  Cardiovascular: Normal rate, regular rhythm and normal heart sounds.  Exam reveals no gallop and no friction rub.   No murmur heard. Pulmonary/Chest: Effort normal and breath sounds normal. No respiratory  distress.  Abdominal: Soft. Bowel sounds are normal. She exhibits no distension and no mass. There is tenderness (suprapubic). There is no guarding.  Genitourinary:       Pelvic exam: normal external genitalia, vulva, vagina, cervix, uterus and adnexa, VULVA: normal appearing vulva with no masses, tenderness or lesions, VAGINA: normal appearing vagina with normal color and  leukorrhea, no lesions, WET MOUNT done - results: white blood cells. No cervix or pelvic organs   Musculoskeletal: Normal range of motion.  Neurological: She is alert and oriented to person, place, and time.  Skin: Skin is warm and dry. She is not diaphoretic.  Psychiatric:  Appears somnolent. Speaks slowly.    ED Course  Procedures (including critical care time)  Labs Reviewed  COMPREHENSIVE METABOLIC PANEL - Abnormal; Notable for the following:    Alkaline Phosphatase 37 (*)     Total Bilirubin 0.1 (*)     GFR calc non Af Amer 85 (*)     All other components within normal limits  URINALYSIS, ROUTINE W REFLEX MICROSCOPIC - Abnormal; Notable for the following:    APPearance CLOUDY (*)     Leukocytes, UA LARGE (*)     All other components within normal limits  URINE MICROSCOPIC-ADD ON - Abnormal; Notable for the following:    Bacteria, UA FEW (*)     All other components within normal limits  CBC WITH DIFFERENTIAL   No results found.  Abnormal  Component (Lab Inquiry)      Result Time  Yeast Wet Prep HPF POC  Trich, Wet Prep  CLUE CELLS  WBC, Wet Prep HPF POC    11/18/11 2347  NONE SEEN  NONE SEEN  NONE SEEN  MANY (A)         Comprehensive metabolic panel (Final result)  Abnormal  Component (Lab Inquiry)      Result Time  NA  K  CL  CO2  GLUCOSE    11/18/11 1945  137  4.4  98  32  94           Result Time  BUN  Creatinine, Ser  CALCIUM  PROTEIN  Albumin    11/18/11 1945  11  0.85  9.8  7.6  4.1           Result Time  AST  ALT  ALK PHOS  BILI TOTL  GFR calc non Af Amer    11/18/11 1945  14   15  37 (L)  0.1 (L)  85 (L)           Result Time  GFR calc Af Amer    11/18/11 1945  >90 The eGFR has been calculated using the CKD EPI equation. This calculation has not been validated in all clinical situations. eGFR's persistently <90 mL/min signify possible Chronic Kidney Disease.         Urine microscopic-add on (Final result)  Abnormal  Component (Lab Inquiry)      Result Time  WBC U  BACTERIA    11/18/11 1932  TOO NUMEROUS TO COUNT  FEW (A)         Urinalysis, Routine w reflex microscopic (Final result)  Abnormal  Component (Lab Inquiry)      Result Time  Color, Urine  APPearance  Specific Gravity, Urine  pH  GLUCOSE U    11/18/11 1932  YELLOW  CLOUDY (A)  1.019  7.0  NEGATIVE           Result Time  Hgb urine dipstick  BILI UR  Ketones, ur  Protein, ur  Urobilinogen, UA    11/18/11 1932  NEGATIVE  NEGATIVE  NEGATIVE  NEGATIVE  0.2           Result Time  Nitrite  LEUKOCYTES    11/18/11 1932  NEGATIVE  LARGE (A)         CBC with Differential (Final result)   Component (Lab Inquiry)      Result Time  WBC  RBC  HGB  HCT  MCV    11/18/11 1930  7.9  4.36  13.0  38.7  88.8  Result Time  MCH  MCHC  RDW  PLT  NEUTRO PCT    11/18/11 1930  29.8  33.6  14.4  286  60           Result Time  AB NEUTRO  LYMPHO PCT  AB LYM  MONO PCT  MONO ABS    11/18/11 1930  4.7  28  2.2  9  0.7           Result Time  EOS PCT  EOSINO ABS  BASOS PCT  BASOS ABS    11/18/11 1930  2  0.2  1  0.0      1. UTI (lower urinary tract infection)       MDM  Patient with UTI. Treated with Rocephin IV.  Patient will be d/c on keflex and pain control. Suggest pelvic floor muscle PT. And Urologic f/u. Discussed reasons to seek immediate care. Patient expresses understanding and agrees with plan.         Arthor Captain, PA-C 11/20/11 [phone]

## 2011-11-19 MED ORDER — PHENAZOPYRIDINE HCL 200 MG PO TABS: 200.0000 mg | ORAL_TABLET | Freq: Three times a day (TID) | ORAL | Status: DC

## 2011-11-19 MED ORDER — HYDROCODONE-ACETAMINOPHEN 5-325 MG PO TABS: 1.0000 | ORAL_TABLET | Freq: Four times a day (QID) | ORAL | Status: DC | PRN

## 2011-11-19 MED ORDER — CEPHALEXIN 500 MG PO CAPS: 500.0000 mg | ORAL_CAPSULE | Freq: Four times a day (QID) | ORAL | Status: DC

## 2011-11-19 NOTE — ED Notes (Signed)
Pt discharged.Vital signs stable .GCS 15 

## 2011-11-22 NOTE — ED Provider Notes (Signed)
Medical screening examination/treatment/procedure(s) were performed by non-physician practitioner and as supervising physician I was immediately available for consultation/collaboration.   Loren Racer, MD 11/22/11 [phone]

## 2011-12-14 ENCOUNTER — Inpatient Hospital Stay (HOSPITAL_COMMUNITY)
Admission: EM | Admit: 2011-12-14 | Discharge: 2011-12-17 | DRG: 300 | Disposition: A | Discharge status: Home / Self Care | Payer: Medicaid Other | Attending: Internal Medicine | Admitting: Internal Medicine

## 2011-12-14 ENCOUNTER — Encounter (HOSPITAL_COMMUNITY): Payer: Self-pay

## 2011-12-14 ENCOUNTER — Inpatient Hospital Stay (HOSPITAL_COMMUNITY): Payer: Medicaid Other

## 2011-12-14 DIAGNOSIS — D6859 Other primary thrombophilia: Secondary | ICD-10-CM | POA: Insufficient documentation

## 2011-12-14 DIAGNOSIS — I749 Embolism and thrombosis of unspecified artery: Secondary | ICD-10-CM

## 2011-12-14 DIAGNOSIS — I82220 Acute embolism and thrombosis of inferior vena cava: Secondary | ICD-10-CM

## 2011-12-14 DIAGNOSIS — I8290 Acute embolism and thrombosis of unspecified vein: Secondary | ICD-10-CM

## 2011-12-14 DIAGNOSIS — J45909 Unspecified asthma, uncomplicated: Secondary | ICD-10-CM | POA: Diagnosis present

## 2011-12-14 DIAGNOSIS — N3010 Interstitial cystitis (chronic) without hematuria: Secondary | ICD-10-CM | POA: Diagnosis present

## 2011-12-14 DIAGNOSIS — F419 Anxiety disorder, unspecified: Secondary | ICD-10-CM | POA: Diagnosis present

## 2011-12-14 DIAGNOSIS — R209 Unspecified disturbances of skin sensation: Secondary | ICD-10-CM

## 2011-12-14 DIAGNOSIS — M797 Fibromyalgia: Secondary | ICD-10-CM

## 2011-12-14 DIAGNOSIS — I82409 Acute embolism and thrombosis of unspecified deep veins of unspecified lower extremity: Secondary | ICD-10-CM | POA: Insufficient documentation

## 2011-12-14 DIAGNOSIS — R0602 Shortness of breath: Secondary | ICD-10-CM

## 2011-12-14 DIAGNOSIS — R319 Hematuria, unspecified: Secondary | ICD-10-CM

## 2011-12-14 DIAGNOSIS — F411 Generalized anxiety disorder: Secondary | ICD-10-CM | POA: Diagnosis present

## 2011-12-14 DIAGNOSIS — R109 Unspecified abdominal pain: Secondary | ICD-10-CM

## 2011-12-14 DIAGNOSIS — Z79899 Other long term (current) drug therapy: Secondary | ICD-10-CM

## 2011-12-14 DIAGNOSIS — IMO0001 Reserved for inherently not codable concepts without codable children: Secondary | ICD-10-CM | POA: Diagnosis present

## 2011-12-14 DIAGNOSIS — D6851 Activated protein C resistance: Secondary | ICD-10-CM | POA: Diagnosis present

## 2011-12-14 HISTORY — DX: Anxiety disorder, unspecified: F41.9

## 2011-12-14 HISTORY — DX: Acute embolism and thrombosis of unspecified vein: I82.90

## 2011-12-14 LAB — PROTIME-INR
INR: 0.99 (ref 0.00–1.49)
Prothrombin Time: 13 seconds (ref 11.6–15.2)

## 2011-12-14 LAB — URINALYSIS, ROUTINE W REFLEX MICROSCOPIC
Bilirubin Urine: NEGATIVE
Glucose, UA: NEGATIVE mg/dL
Ketones, ur: NEGATIVE mg/dL
pH: 7 (ref 5.0–8.0)

## 2011-12-14 LAB — MRSA PCR SCREENING: MRSA by PCR: POSITIVE — AB

## 2011-12-14 LAB — BASIC METABOLIC PANEL
CO2: 31 mEq/L (ref 19–32)
Chloride: 97 mEq/L (ref 96–112)
Creatinine, Ser: 0.66 mg/dL (ref 0.50–1.10)
Potassium: 4.2 mEq/L (ref 3.5–5.1)
Sodium: 137 mEq/L (ref 135–145)

## 2011-12-14 LAB — CBC WITH DIFFERENTIAL/PLATELET
Basophils Absolute: 0 10*3/uL (ref 0.0–0.1)
HCT: 39.8 % (ref 36.0–46.0)
Lymphocytes Relative: 22 % (ref 12–46)
Monocytes Absolute: 0.8 10*3/uL (ref 0.1–1.0)
Neutro Abs: 6.6 10*3/uL (ref 1.7–7.7)
RDW: 15.4 % (ref 11.5–15.5)
WBC: 9.8 10*3/uL (ref 4.0–10.5)

## 2011-12-14 LAB — URINE MICROSCOPIC-ADD ON

## 2011-12-14 LAB — APTT: aPTT: 35 seconds (ref 24–37)

## 2011-12-14 MED ORDER — WARFARIN VIDEO: Freq: Once | Status: DC

## 2011-12-14 MED ORDER — EVENING PRIMROSE OIL 1000 MG PO CAPS: 1000.0000 mg | ORAL_CAPSULE | Freq: Every evening | ORAL | Status: DC

## 2011-12-14 MED ORDER — ALBUTEROL SULFATE HFA 108 (90 BASE) MCG/ACT IN AERS
2.0000 | INHALATION_SPRAY | RESPIRATORY_TRACT | Status: DC | PRN
  Filled 2011-12-14: qty 6.7

## 2011-12-14 MED ORDER — BISACODYL 5 MG PO TBEC: 10.0000 mg | DELAYED_RELEASE_TABLET | Freq: Every day | ORAL | Status: DC | PRN

## 2011-12-14 MED ORDER — VITAMIN B-12 1000 MCG PO TABS
5000.0000 ug | ORAL_TABLET | Freq: Every day | ORAL | Status: DC
  Administered 2011-12-15 – 2011-12-16 (×2): 5000 ug via ORAL
  Filled 2011-12-14 (×4): qty 5

## 2011-12-14 MED ORDER — WARFARIN SODIUM 7.5 MG PO TABS
7.5000 mg | ORAL_TABLET | ORAL | Status: AC
  Administered 2011-12-14: 7.5 mg via ORAL
  Filled 2011-12-14: qty 1

## 2011-12-14 MED ORDER — ONDANSETRON HCL 4 MG/2ML IJ SOLN
4.0000 mg | Freq: Four times a day (QID) | INTRAMUSCULAR | Status: DC | PRN
  Administered 2011-12-16: 4 mg via INTRAVENOUS
  Filled 2011-12-14 (×2): qty 2

## 2011-12-14 MED ORDER — PENTOSAN POLYSULFATE SODIUM 100 MG PO CAPS
200.0000 mg | ORAL_CAPSULE | Freq: Two times a day (BID) | ORAL | Status: DC
  Administered 2011-12-15 – 2011-12-17 (×5): 200 mg via ORAL
  Filled 2011-12-14 (×11): qty 2

## 2011-12-14 MED ORDER — VALACYCLOVIR HCL 500 MG PO TABS
500.0000 mg | ORAL_TABLET | Freq: Every day | ORAL | Status: DC
  Administered 2011-12-14 – 2011-12-16 (×3): 500 mg via ORAL
  Filled 2011-12-14 (×4): qty 1

## 2011-12-14 MED ORDER — ENOXAPARIN SODIUM 100 MG/ML ~~LOC~~ SOLN
85.0000 mg | Freq: Two times a day (BID) | SUBCUTANEOUS | Status: DC
  Filled 2011-12-14: qty 1

## 2011-12-14 MED ORDER — HEPARIN (PORCINE) IN NACL 100-0.45 UNIT/ML-% IJ SOLN
1100.0000 [IU]/h | INTRAMUSCULAR | Status: DC
  Administered 2011-12-14: 1250 [IU]/h via INTRAVENOUS
  Filled 2011-12-14 (×2): qty 250

## 2011-12-14 MED ORDER — MUPIROCIN 2 % EX OINT
1.0000 "application " | TOPICAL_OINTMENT | Freq: Two times a day (BID) | CUTANEOUS | Status: DC
  Administered 2011-12-14 – 2011-12-17 (×6): 1 via NASAL
  Filled 2011-12-14: qty 22

## 2011-12-14 MED ORDER — FLUTICASONE PROPIONATE 50 MCG/ACT NA SUSP
1.0000 | Freq: Every day | NASAL | Status: DC | PRN
  Filled 2011-12-14: qty 16

## 2011-12-14 MED ORDER — ONDANSETRON HCL 4 MG PO TABS: 4.0000 mg | ORAL_TABLET | Freq: Four times a day (QID) | ORAL | Status: DC | PRN

## 2011-12-14 MED ORDER — SODIUM CHLORIDE 0.9 % IJ SOLN: 3.0000 mL | Freq: Two times a day (BID) | INTRAMUSCULAR | Status: DC

## 2011-12-14 MED ORDER — CHLORHEXIDINE GLUCONATE CLOTH 2 % EX PADS
6.0000 | MEDICATED_PAD | Freq: Every day | CUTANEOUS | Status: DC
  Administered 2011-12-15 – 2011-12-17 (×3): 6 via TOPICAL

## 2011-12-14 MED ORDER — OXYBUTYNIN CHLORIDE 5 MG PO TABS
5.0000 mg | ORAL_TABLET | Freq: Three times a day (TID) | ORAL | Status: DC | PRN
  Administered 2011-12-14 – 2011-12-15 (×2): 5 mg via ORAL
  Filled 2011-12-14 (×2): qty 1

## 2011-12-14 MED ORDER — SIMETHICONE 80 MG PO CHEW
80.0000 mg | CHEWABLE_TABLET | Freq: Four times a day (QID) | ORAL | Status: DC
  Administered 2011-12-14 – 2011-12-17 (×11): 80 mg via ORAL
  Filled 2011-12-14 (×13): qty 1

## 2011-12-14 MED ORDER — ACETAMINOPHEN 650 MG RE SUPP: 650.0000 mg | Freq: Four times a day (QID) | RECTAL | Status: DC | PRN

## 2011-12-14 MED ORDER — TRAMADOL HCL 50 MG PO TABS
50.0000 mg | ORAL_TABLET | Freq: Four times a day (QID) | ORAL | Status: DC | PRN
  Administered 2011-12-14 – 2011-12-16 (×5): 50 mg via ORAL
  Filled 2011-12-14 (×6): qty 1

## 2011-12-14 MED ORDER — SODIUM CHLORIDE 0.9 % IV SOLN
INTRAVENOUS | Status: DC
  Administered 2011-12-14: 1000 mL via INTRAVENOUS

## 2011-12-14 MED ORDER — CYCLOBENZAPRINE HCL 10 MG PO TABS
10.0000 mg | ORAL_TABLET | Freq: Three times a day (TID) | ORAL | Status: DC
  Administered 2011-12-14 – 2011-12-17 (×8): 10 mg via ORAL
  Filled 2011-12-14 (×12): qty 1

## 2011-12-14 MED ORDER — POLYETHYLENE GLYCOL 3350 17 G PO PACK
17.0000 g | PACK | Freq: Every day | ORAL | Status: DC | PRN
  Filled 2011-12-14: qty 1

## 2011-12-14 MED ORDER — WARFARIN - PHARMACIST DOSING INPATIENT: Freq: Every day | Status: DC

## 2011-12-14 MED ORDER — NAPROXEN 375 MG PO TABS
375.0000 mg | ORAL_TABLET | Freq: Two times a day (BID) | ORAL | Status: DC
  Administered 2011-12-15 – 2011-12-16 (×4): 375 mg via ORAL
  Filled 2011-12-14 (×6): qty 1

## 2011-12-14 MED ORDER — MORPHINE SULFATE 2 MG/ML IJ SOLN
1.0000 mg | INTRAMUSCULAR | Status: DC | PRN
  Administered 2011-12-15 – 2011-12-16 (×3): 2 mg via INTRAVENOUS
  Filled 2011-12-14 (×4): qty 1

## 2011-12-14 MED ORDER — ENOXAPARIN SODIUM 100 MG/ML ~~LOC~~ SOLN
85.0000 mg | SUBCUTANEOUS | Status: AC
  Administered 2011-12-14: 85 mg via SUBCUTANEOUS
  Filled 2011-12-14: qty 1

## 2011-12-14 MED ORDER — PSEUDOEPHEDRINE HCL 60 MG PO TABS
60.0000 mg | ORAL_TABLET | Freq: Four times a day (QID) | ORAL | Status: DC
  Administered 2011-12-14 – 2011-12-17 (×11): 60 mg via ORAL
  Filled 2011-12-14 (×13): qty 1

## 2011-12-14 MED ORDER — ACETAMINOPHEN 325 MG PO TABS
650.0000 mg | ORAL_TABLET | Freq: Four times a day (QID) | ORAL | Status: DC | PRN
  Administered 2011-12-14: 650 mg via ORAL
  Filled 2011-12-14: qty 2

## 2011-12-14 MED ORDER — PATIENT'S GUIDE TO USING COUMADIN BOOK
Freq: Once | Status: DC
  Filled 2011-12-14: qty 1

## 2011-12-14 MED ORDER — IOHEXOL 350 MG/ML SOLN
125.0000 mL | Freq: Once | INTRAVENOUS | Status: AC | PRN
  Administered 2011-12-14: 125 mL via INTRAVENOUS

## 2011-12-14 MED ORDER — DULOXETINE HCL 60 MG PO CPEP
60.0000 mg | ORAL_CAPSULE | Freq: Two times a day (BID) | ORAL | Status: DC
  Administered 2011-12-14 – 2011-12-17 (×6): 60 mg via ORAL
  Filled 2011-12-14 (×7): qty 1

## 2011-12-14 MED ORDER — OXYCODONE HCL 5 MG PO TABS
5.0000 mg | ORAL_TABLET | ORAL | Status: DC | PRN
  Administered 2011-12-14 – 2011-12-17 (×9): 5 mg via ORAL
  Filled 2011-12-14 (×10): qty 1

## 2011-12-14 MED ORDER — CYANOCOBALAMIN 5000 MCG SL SUBL: 5000.0000 ug | SUBLINGUAL_TABLET | Freq: Every day | SUBLINGUAL | Status: DC

## 2011-12-14 MED ORDER — ALUM & MAG HYDROXIDE-SIMETH 200-200-20 MG/5ML PO SUSP
30.0000 mL | Freq: Four times a day (QID) | ORAL | Status: DC | PRN
  Filled 2011-12-14: qty 30

## 2011-12-14 MED ORDER — HYDROXYZINE PAMOATE 50 MG PO CAPS
50.0000 mg | ORAL_CAPSULE | Freq: Every day | ORAL | Status: DC
  Administered 2011-12-14 – 2011-12-16 (×3): 50 mg via ORAL
  Filled 2011-12-14 (×4): qty 1

## 2011-12-14 MED ORDER — SIMETHICONE 125 MG PO CAPS: 125.0000 mg | ORAL_CAPSULE | Freq: Four times a day (QID) | ORAL | Status: DC

## 2011-12-14 NOTE — ED Notes (Signed)
Patient transported to CT 

## 2011-12-14 NOTE — Consult Note (Signed)
[facility]      Consult Note  Patient name: April [name] MRN: [id number] DOB: 06-26-1970 Sex: female  Consulting Physician:  Hospital Service  Reason for Consult:  Chief Complaint  Patient presents with  . 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 The patient states that she is also having some chest discomfort and trouble breathing. She states that she has had some numbness in her face and mouth as well.  The patient is heterozygous for factor V Leiden mutation. Her father tested positive for the dictation after having multiple DVTs following surgery. She has not been on any medication for this. She has taken aspirin in the past but stopped secondary to heavy menstrual bleeding. She has undergone a hysterectomy. Her CT scan was ordered for a workup of hematuria and frequent urinary tract infections. She has not had any instrumentation that I'm aware of to date. She has also had nervousness and palpitations. She doesn't endorse some low-grade fevers. She has had 3 episodes of diarrhea. She complains of generalized weakness, headache, and anxiety.  Past Medical History  Diagnosis Date  . Fibromyalgia   . Pancreatitis   . Interstitial cystitis   . Pelvic floor dysfunction   . Sinus infection     chronic  . Factor 5 Leiden mutation, heterozygous   . Fracture of 5th metatarsal 03/2011    left foot -    . Ovarian cyst   . Asthma     no inhaler  . 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  [name] Anxiety 12/14/2011    Past Surgical History  Procedure Date  . Cystoscopy with hydrodistension and biopsy 04/21/2011    Dr Lorenz Coaster  . Laparoscopic nissen fundoplication 2007  . Cholecystectomy   .  Tubal ligation 2010  . Wisdom tooth extraction   . Laparoscopic assisted vaginal hysterectomy 08/21/2011    Procedure: LAPAROSCOPIC ASSISTED VAGINAL HYSTERECTOMY;  Surgeon: Jeani Hawking, MD;  Location: [facility];  Service: Gynecology;  Laterality: N/A;  OPEN LAPAROSCOPIC  . Salpingoophorectomy 08/21/2011    Procedure: SALPINGO OOPHERECTOMY;  Surgeon: Jeani Hawking, MD;  Location: [facility];  Service: Gynecology;  Laterality: Bilateral;  . Abdominal hysterectomy     History   Social History  . Marital Status: Married    Spouse Name: N/A    Number of Children: N/A  . Years of Education: N/A   Occupational History  . Not on file.   Social History Main Topics  . Smoking status: Never Smoker   . Smokeless tobacco: Never Used  . Alcohol Use: No  . Drug Use: No  . Sexually Active: Yes    Birth Control/ Protection: Surgical   Other Topics Concern  . Not on file   Social History Narrative  . No narrative on file    Family History  Problem Relation Age of Onset  . Anesthesia problems Neg Hx     Allergies as of 12/14/2011 - Review Complete 12/14/2011  Allergen Reaction Noted  . Amitriptyline Other (See Comments) 12/14/2011  . Bromelains (pineapple extract) Other (See Comments) 12/14/2011  . Celexa (citalopram hydrobromide) Other (See Comments) 12/14/2011  . Methylene blue Other (See Comments) 05/13/2011  .  Sertraline Other (See Comments) 12/14/2011  . Sulfonamide derivatives Other (See Comments)   . Bromaline (albertsons di bromm) Rash 05/13/2011  . Other Rash 05/13/2011  . Penicillins Swelling and Rash     No current facility-administered medications on file prior to encounter.   Current Outpatient Prescriptions on File Prior to Encounter  Medication Sig Dispense Refill  . albuterol (PROVENTIL HFA;VENTOLIN HFA) 108 (90 BASE) MCG/ACT inhaler Inhale 1 puff into the lungs as needed. For shortness of breath or wheezing      . Calcium-Phosphorus-Vitamin D (CALCIUM GUMMIES  PO) Take 1 tablet by mouth every evening.      . Cyanocobalamin 5000 MCG SUBL Place 5,000 mcg under the tongue daily.       . DULoxetine (CYMBALTA) 60 MG capsule Take 60 mg by mouth every 12 (twelve) hours.       . Evening Primrose Oil 1000 MG CAPS Take 1,000 mg by mouth every evening.       . fluticasone (FLONASE) 50 MCG/ACT nasal spray Place 1 spray into the nose daily as needed. For stuffy nose      . Multiple Vitamin (MULITIVITAMIN WITH MINERALS) TABS Take 1 tablet by mouth every evening.       . pentosan polysulfate (ELMIRON) 100 MG capsule Take 200 mg by mouth 2 (two) times daily before a meal.       . valACYclovir (VALTREX) 500 MG tablet Take 500 mg by mouth at bedtime.          REVIEW OF SYSTEMS: Cardiovascular: No chest pain, chest pressure, positive for palpitations.  Pulmonary:  positive for shortness of breath  Neurologic: Positive for numbness in her face and lips as well as her feet  Hematologic: Positive for hematuria  Musculoskeletal: No joint pain or joint swelling. Gastrointestinal:  chronic abdominal pain  Genitourinary:  positive for hematuria  Psychiatric:: No history of major depression. Integumentary: No rashes or ulcers. Constitutional:Low-grade fever  PHYSICAL EXAMINATION: General: The patient appears their stated age.  Vital signs are BP 116/76  Pulse 101  Temp 99.2 F (37.3 C) (Oral)  Resp 13  SpO2 99%  LMP 05/20/2011 Pulmonary: Respirations are non-labored HEENT:  No gross abnormalities Abdomen: Soft and non-tender  Musculoskeletal: There are no major deformities.   Neurologic: No focal weakness or paresthesias are detected, Skin: There are no ulcer or rashes noted. Psychiatric: The patient has normal affect. Cardiovascular: There is a regular rate and rhythm without significant murmur appreciated.Palpable pedal pulse  Diagnostic Studies: Patient underwent CT scan of the chest abdomen and pelvis which revealed no evidence of acute pulmonary emboli.  There is acute thrombus within the right ovarian vein extending out into the inferior vena cava  Outside Studies/Documentation Historical records were reviewed.  They showed gonadal vein thrombosis extending into the renal vein  Medication Changes: Patient will require anticoagulation, initially with heparin  Assessment:  Factor V Leiden heterozygous mutation  Plan: I have reviewed her CT scan. There does appear to be what looks like acute thrombus within the right ovarian pain. There is a small extension into the inferior vena cava. Due to 2 the size of the thrombus within the inferior vena cava, I do not feel that from a lysis or percutaneous mechanical thrombectomy is warranted. Since the patient developed this thrombus without being on anticoagulation, I feel the best treatment in this situation is to place her on full dose anticoagulation with heparin and Coumadin , or possibly some of the newer oral anticoagulants. Since this  was protected while the patient was undergoing a workup for hematuria, I feel that an aggressive approach to determine the cause of her hematuria should be undertaken, since she now is going to require anticoagulation. If she continues to have hematuria such that she needs to come off of her anticoagulation, consideration would be for a suprarenal IVC filter. However, I would try to do everything possible to avoid placing a suprarenal filter.   Please contact Dr. Leonides Sake over the weekend if further questions arise.  Jorge Ny, M.D. [facility] Office: [phone] Pager:  [phone]

## 2011-12-14 NOTE — ED Notes (Signed)
Admission MD at bedside.  

## 2011-12-14 NOTE — ED Notes (Signed)
Pt presents with numbness to lips, eyes and face x 1 week ago.  Pt was seen at [facility] in [place] for urinary symptoms, and had a CT scan of abdomen on Tuesday, with clot found to R gonadal vein.  Pt reports h/o anxiety and nervousness.

## 2011-12-14 NOTE — ED Notes (Signed)
Patient has multiple concerns: anxiety, numbness in face and clotting disorder. Was being scheduled for a psych eval for anxiety by PCP.  Also states "has fibromyalgia that causes her concern:"

## 2011-12-14 NOTE — ED Notes (Signed)
Report to CDU RN- Patient transported to CDU #3

## 2011-12-14 NOTE — ED Notes (Signed)
Admitting MD would like patient to have CT before going upstairs to inpatient bed. CT notified and is on the way to get the patient for her scans.

## 2011-12-14 NOTE — ED Notes (Signed)
Patient back from CT. MD currently at bedside. Will transport to inpt bed shortly.

## 2011-12-14 NOTE — ED Provider Notes (Signed)
History     CSN: [id number]  Arrival date & time 12/14/11  1242   First MD Initiated Contact with Patient 12/14/11 1337      No chief complaint on file.   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 She states that she was made aware of this finding by a phone call from her urologist's (Dr. Logan Bores) office, and that she was told she should go to the ED for further evaluation. Pt states she was at her PCP at that time, being evaluated for chronic symptoms associated with fibromyalgia and anxiety. Pt denies any abdominal or pelvic pain. Pt states she is heterozygous for Factor V leiden.  She denies any previous DVT/PE. Pt is s/p hysterectomy/salpingoophorectomy in 07/2011 and denies any vaginal bleeding or discharge since that time.   Past Medical History  Diagnosis Date  . Fibromyalgia   . Pancreatitis   . Interstitial cystitis   . Pelvic floor dysfunction   . Sinus infection     chronic  . Factor 5 Leiden mutation, heterozygous   . Fracture of 5th metatarsal 03/2011    left foot -    . Ovarian cyst   . Asthma     no inhaler    Past Surgical History  Procedure Date  . Cystoscopy with hydrodistension and biopsy 04/21/2011    Dr Lorenz Coaster  . Laparoscopic nissen fundoplication 2007  . Cholecystectomy   . Tubal ligation 2010  . Wisdom tooth extraction   . Laparoscopic assisted vaginal hysterectomy 08/21/2011    Procedure: LAPAROSCOPIC ASSISTED VAGINAL HYSTERECTOMY;  Surgeon: Jeani Hawking, MD;  Location: [facility];  Service: Gynecology;  Laterality: N/A;  OPEN LAPAROSCOPIC  . Salpingoophorectomy 08/21/2011    Procedure: SALPINGO OOPHERECTOMY;  Surgeon: Jeani Hawking, MD;  Location: [facility];  Service: Gynecology;  Laterality: Bilateral;  . Abdominal hysterectomy     Family History  Problem Relation  Age of Onset  . Anesthesia problems Neg Hx     History  Substance Use Topics  . Smoking status: Never Smoker   . Smokeless tobacco: Never Used  . Alcohol Use: No    OB History    Grav Para Term Preterm Abortions TAB SAB Ect Mult Living   4 4 4       4       Review of Systems  Constitutional: Negative for fever and chills.  Respiratory: Negative for cough and shortness of breath.   Cardiovascular: Negative for chest pain, palpitations and leg swelling.  Gastrointestinal: Positive for nausea. Negative for vomiting, abdominal pain and diarrhea.  Genitourinary: Negative for dysuria and hematuria.  Skin: Negative for rash.  Neurological: Positive for numbness (chronic numbness in "eyeballs, face, entire body"). Negative for dizziness, syncope and light-headedness.  Hematological: Does not bruise/bleed easily.  Psychiatric/Behavioral: The patient is nervous/anxious.     Allergies  Methylene blue; Penicillins; Sulfonamide derivatives; Bromaline; and Other  Home Medications   Current Outpatient Rx  Name  Route  Sig  Dispense  Refill  . ALBUTEROL SULFATE HFA 108 (90 BASE) MCG/ACT IN AERS   Inhalation   Inhale 2 puffs into the lungs as needed.         [name] BACLOFEN 20 MG PO TABS   Oral   Take 20 mg by mouth 3 (three) times daily.         [name] CALCIUM CITRATE 250 MG PO TABS  Oral   Take 2 tablets by mouth daily.          . CEPHALEXIN 500 MG PO CAPS   Oral   Take 1 capsule (500 mg total) by mouth 4 (four) times daily.   40 capsule   0   . CYANOCOBALAMIN 5000 MCG SL SUBL   Sublingual   Place 1 tablet under the tongue daily.         [name] DIAZEPAM 5 MG PO TABS   Oral   Take 5 mg by mouth at bedtime.         . DULOXETINE HCL 60 MG PO CPEP   Oral   Take 60 mg by mouth daily.         [name] EVENING PRIMROSE OIL 1000 MG PO CAPS   Oral   Take 1,000 mg by mouth daily.         [name] HYDROCODONE-ACETAMINOPHEN 5-325 MG PO TABS   Oral   Take 1-2 tablets by mouth every 6  (six) hours as needed for pain.   20 tablet   0   . HYDROCODONE-IBUPROFEN 7.5-200 MG PO TABS   Oral   Take 2 tablets by mouth every 4 (four) hours as needed for pain. For pain   60 tablet   0   . HYDROXYZINE HCL 50 MG PO TABS   Oral   Take 50 mg by mouth daily at 10 pm.         . IBUPROFEN 200 MG PO TABS   Oral   Take 400 mg by mouth every 6 (six) hours. Scheduled along with vicoprofen         . ADULT MULTIVITAMIN W/MINERALS CH   Oral   Take 1 tablet by mouth daily.         [name] OVER THE COUNTER MEDICATION   Oral   Take 1 capsule by mouth daily. Mega D3 (5,000 units D3/200mg  red wine complex/Jap Nut Weed extract-resveritrol)         . OXYBUTYNIN CHLORIDE 5 MG PO TABS   Oral   Take 2 tablets (10 mg total) by mouth 3 (three) times daily.   60 tablet   0   . PENTOSAN POLYSULFATE SODIUM 100 MG PO CAPS   Oral   Take 200 mg by mouth 2 (two) times daily.         [name] PHENAZOPYRIDINE HCL 200 MG PO TABS   Oral   Take 1 tablet (200 mg total) by mouth 3 (three) times daily.   6 tablet   0   . ACIDOPHILUS HIGH-POTENCY PO CAPS   Oral   Take 2 capsules by mouth daily. 2 caps = 10 billion units         . PSEUDOEPHEDRINE HCL 30 MG PO TABS   Oral   Take 60 mg by mouth 4 (four) times daily. scheduled         . SIMETHICONE 125 MG PO CHEW   Oral   Chew 125 mg by mouth 4 (four) times daily. scheduled         . TRIAMTERENE-HCTZ 37.5-25 MG PO CAPS   Oral   Take 1 capsule by mouth every morning.         [name] VALACYCLOVIR HCL 500 MG PO TABS   Oral   Take 500 mg by mouth at bedtime.          [name] VITAMIN E 400 UNITS PO CAPS   Oral   Take 800 Units by mouth  at bedtime.           BP 116/76  Pulse 100  Temp 99.2 F (37.3 C) (Oral)  Resp 16  SpO2 100%  LMP 05/20/2011  Physical Exam  Constitutional: She is oriented to person, place, and time. She appears well-developed and well-nourished. No distress.  HENT:  Head: Normocephalic and atraumatic.  Eyes: Pupils  are equal, round, and reactive to light. No scleral icterus.  Neck: Normal range of motion. Neck supple. No tracheal deviation present.  Cardiovascular: Normal rate and regular rhythm.   No murmur heard. Pulmonary/Chest: Effort normal. She has no wheezes. She has no rales.  Abdominal: Soft. Bowel sounds are normal. She exhibits no distension. There is no tenderness.  Musculoskeletal: Normal range of motion. She exhibits no edema.  Neurological: She is alert and oriented to person, place, and time. No cranial nerve deficit.  Skin: Skin is warm and dry. No rash noted.  Psychiatric: She has a normal mood and affect. Her behavior is normal.    ED Course  Procedures (including critical care time)   Labs Reviewed  CBC WITH DIFFERENTIAL  BASIC METABOLIC PANEL  PROTIME-INR  APTT   No results found.   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 Spoke with Dr. Logan Bores office who state that they wish for pt to be admitted for anticoagulation. Pt started on lovenox and coumadin in ED, and will be admitted by [facility] for further observation/management.         Elfredia Nevins, MD 12/14/11 [id number]

## 2011-12-14 NOTE — Progress Notes (Signed)
ANTICOAGULATION CONSULT NOTE - Initial Consult  Pharmacy Consult for Lovenox / Coumadin Indication: VTE treatment  Allergies  Allergen Reactions  . Methylene Blue Other (See Comments)    Caused inflamed pancreas, per pt should not use  . Penicillins Swelling  . Sulfonamide Derivatives Other (See Comments)    excrutiating pain, patient states it caused her to have osteoarthritis  . Bromaline (Albertsons Di Bromm) Rash  . Other Rash    Mango    Patient Measurements: Weight = 85 kg  Labs:  Basename 12/14/11 1304  HGB 13.7  HCT 39.8  PLT 342  APTT 35  LABPROT 13.0  INR 0.99  HEPARINUNFRC --  CREATININE 0.66  CKTOTAL --  CKMB --  TROPONINI --    The CrCl is unknown because both a height and weight (above a minimum accepted value) are required for this calculation.   Medical History: Past Medical History  Diagnosis Date  . Fibromyalgia   . Pancreatitis   . Interstitial cystitis   . Pelvic floor dysfunction   . Sinus infection     chronic  . Factor 5 Leiden mutation, heterozygous   . Fracture of 5th metatarsal 03/2011    left foot -    . Ovarian cyst   . Asthma     no inhaler   Assessment: 41 year old female admitted with anxiety, numbness in face, and clotting disorder (factor 5 mutation)  Beginning Lovenox and Coumadin  Goal of Therapy:  INR 2-3 Monitor platelets by anticoagulation protocol: Yes   Plan:  1) Lovenox 85 mg sq Q 12 hours 2) Coumadin 7.5 mg po x 1 dose tonight 3) Daily INR  Thank you. Okey Regal, PharmD [phone]  12/14/2011,2:48 PM

## 2011-12-14 NOTE — Progress Notes (Signed)
ANTICOAGULATION CONSULT NOTE - Follow Up   Pharmacy Consult for:  Heparin  Indication: VTE treatment  Allergies  Allergen Reactions  . Amitriptyline Other (See Comments)    Change in mental status  . Bromelains (Pineapple Extract) Other (See Comments)    Severe pain  . Celexa (Citalopram Hydrobromide) Other (See Comments)    Change in mental status  . Methylene Blue Other (See Comments)    Caused inflamed pancreas, per pt should not use  . Sertraline Other (See Comments)    Suicidal thoughts  . Sulfonamide Derivatives Other (See Comments)    excrutiating pain, patient states it caused her to have osteoarthritis  . Bromaline (Albertsons Di Bromm) Rash  . Other Rash    Mango  . Penicillins Swelling and Rash   Patient Measurements: Weight ~ 85 kg Height ~ 163 cm IBW ~ 59 kg Heparin Dosing Weight 67 kg  Labs:  Basename 12/14/11 1304  HGB 13.7  HCT 39.8  PLT 342  APTT 35  LABPROT 13.0  INR 0.99  HEPARINUNFRC --  CREATININE 0.66  CKTOTAL --  CKMB --  TROPONINI --   The CrCl is unknown because both a height and weight (above a minimum accepted value) are required for this calculation.  Medical History: Past Medical History  Diagnosis Date  . Fibromyalgia   . Pancreatitis   . Interstitial cystitis   . Pelvic floor dysfunction   . Sinus infection     chronic  . Factor 5 Leiden mutation, heterozygous   . Fracture of 5th metatarsal 03/2011    left foot -    . Ovarian cyst   . Asthma     no inhaler  . 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  [name] Anxiety 12/14/2011   Assessment: 41 year old female admitted with anxiety, numbness in face, and clotting disorder (factor 5 mutation).  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  She received one dose of Enoxaparin 85 mg at 15:49PM and a dose of Warfarin 7.5 mg at this time as well.   The plan now is to switch her to IV heparin until work-up is completed and without need for invasive procedures.  She will need overlap with Warfarin and Heparin/LMWH as well.  Her baseline labs are all within normal limits.  She has no noted bleeding complications, or history of bleeding abnormalities.  Goal of Therapy:  Heparin level 0.3-0.7 Monitor platelets by anticoagulation protocol: Yes   Plan:  1)  Begin IV Heparin at 7PM at 1250 units/hr 2)  Obtain a heparin level 8 hours after starting 3)  Daily heparin level, CBC 4)  Monitor for s/s of bleeding complications.  Nadara Mustard, PharmD., MS Clinical Pharmacist Pager:  [phone] Thank you for allowing pharmacy to be part of this patients care team. 12/14/2011,5:05 PM

## 2011-12-14 NOTE — Progress Notes (Signed)
PHARMACIST - PHYSICIAN ORDER COMMUNICATION  CONCERNING: P&T Medication Policy on Herbal Medications  DESCRIPTION:  This patient's order for:  Evening primrose oil  has been noted.  This product(s) is classified as an "herbal" or natural product. Due to a lack of definitive safety studies or FDA approval, nonstandard manufacturing practices, plus the potential risk of unknown drug-drug interactions while on inpatient medications, the Pharmacy and Therapeutics Committee does not permit the use of "herbal" or natural products of this type within [facility].   ACTION TAKEN: The pharmacy department is unable to verify this order at this time and your patient has been informed of this safety policy. Please reevaluate patient's clinical condition at discharge and address if the herbal or natural product(s) should be resumed at that time.  Thanks,  Brett Fairy, PharmD 12/14/2011 6:38 PM

## 2011-12-14 NOTE — H&P (Signed)
[facility] History and Physical  SINDEE LICHTENBERG OZD:[id number] DOB: [birth year] DOA: 12/14/2011  Referring physician: Dr. Rubin Payor PCP: Yves Dill, MD  Specialists: Vascular surgery: Dr. Myra Gianotti Interventional radiology: Dr. Bonnielee Haff  Chief Complaint: Abnormal CT scan findings  HPI: April [name] is a 41 y.o. female with past medical history of factor V Leiden mutation heterozygous, family history of DVTs, fibromyalgia, prior history of chronic pain and severe dysmenorrhea status post laparoscopic assisted vaginal hysterectomy 08/21/2011 per Dr. Adalberto Ill history of recurrent UTIs, who presents to the ED after receiving a full call from a urologist office Dr. Logan Bores at the  [facility]. and told to present to the ED for further evaluation. Patient states was at her PCPs office on the day of admission being seen for anxiety numbness in the face and neck feet that have been ongoing for a week, nervousness, palpitations, shortness of breath on exertion. Patient stated that the husband received a call from the urologist office and he was told that patient is to present to the ED for further evaluation and information will be sent to the emergency department. Patient does endorse some low-grade fevers, chills, 3 episodes of diarrhea yesterday which has since resolved, generalized weakness, headache, shortness of breath on exertion,. Patient states when laid flat in the ED she got extremely short of breath. Patient states she also feels unsettled and on edge. Patient complaining of numbness around her lips and feels like her heart is racing. Patient denies any nausea no vomiting no abdominal pain no constipation no dysuria no cough no chest pain. 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 CBC which was done was unremarkable. Basic metabolic profile  which was done was unremarkable. INR which was done was 0.99. Patient was given a dose of full dose Lovenox and oral Coumadin in the ED. Will call to admit the patient for further evaluation and management.  Review of Systems: The patient denies anorexia, fever, weight loss,, vision loss, decreased hearing, hoarseness, chest pain, syncope, dyspnea on exertion, peripheral edema, balance deficits, hemoptysis, abdominal pain, melena, hematochezia, severe indigestion/heartburn, hematuria, incontinence, genital sores, muscle weakness, suspicious skin lesions, transient blindness, difficulty walking, depression, unusual weight change, abnormal bleeding, enlarged lymph nodes, angioedema, and breast masses.   Past Medical History  Diagnosis Date  . Fibromyalgia   . Pancreatitis   . Interstitial cystitis   . Pelvic floor dysfunction   . Sinus infection     chronic  . Factor 5 Leiden mutation, heterozygous   . Fracture of 5th metatarsal 03/2011    left foot -    . Ovarian cyst   . Asthma     no inhaler  . 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  [name] Anxiety 12/14/2011   Past Surgical History  Procedure Date  . Cystoscopy with hydrodistension and biopsy 04/21/2011    Dr Lorenz Coaster  . Laparoscopic nissen fundoplication 2007  . Cholecystectomy   . Tubal ligation 2010  . Wisdom tooth extraction   . Laparoscopic assisted vaginal hysterectomy 08/21/2011    Procedure: LAPAROSCOPIC ASSISTED VAGINAL HYSTERECTOMY;  Surgeon: Jeani Hawking, MD;  Location: [facility];  Service: Gynecology;  Laterality: N/A;  OPEN LAPAROSCOPIC  . Salpingoophorectomy 08/21/2011  Procedure: SALPINGO OOPHERECTOMY;  Surgeon: Jeani Hawking, MD;  Location: [facility];  Service: Gynecology;  Laterality: Bilateral;  . Abdominal hysterectomy    Social History:  reports that she has never smoked. She has never used smokeless  tobacco. She reports that she does not drink alcohol or use illicit drugs.  Allergies  Allergen Reactions  . Amitriptyline Other (See Comments)    Change in mental status  . Bromelains (Pineapple Extract) Other (See Comments)    Severe pain  . Celexa (Citalopram Hydrobromide) Other (See Comments)    Change in mental status  . Methylene Blue Other (See Comments)    Caused inflamed pancreas, per pt should not use  . Sertraline Other (See Comments)    Suicidal thoughts  . Sulfonamide Derivatives Other (See Comments)    excrutiating pain, patient states it caused her to have osteoarthritis  . Bromaline (Albertsons Di Bromm) Rash  . Other Rash    Mango  . Penicillins Swelling and Rash    Family History  Problem Relation Age of Onset  . Anesthesia problems Neg Hx     Prior to Admission medications   Medication Sig Start Date End Date Taking? Authorizing Provider  albuterol (PROVENTIL HFA;VENTOLIN HFA) 108 (90 BASE) MCG/ACT inhaler Inhale 1 puff into the lungs as needed. For shortness of breath or wheezing   Yes Historical Provider, MD  Calcium-Phosphorus-Vitamin D (CALCIUM GUMMIES PO) Take 1 tablet by mouth every evening.   Yes Historical Provider, MD  Cyanocobalamin 5000 MCG SUBL Place 5,000 mcg under the tongue daily.    Yes Historical Provider, MD  cyclobenzaprine (FLEXERIL) 10 MG tablet Take 10 mg by mouth 3 (three) times daily. For muscle spasms 12/06/11  Yes Historical Provider, MD  DULoxetine (CYMBALTA) 60 MG capsule Take 60 mg by mouth every 12 (twelve) hours.    Yes Historical Provider, MD  Evening Primrose Oil 1000 MG CAPS Take 1,000 mg by mouth every evening.    Yes Historical Provider, MD  fluticasone (FLONASE) 50 MCG/ACT nasal spray Place 1 spray into the nose daily as needed. For stuffy nose   Yes Historical Provider, MD  guaifenesin (HUMIBID E) 400 MG TABS Take 400 mg by mouth 2 (two) times daily as needed. For congestion   Yes Historical Provider, MD  hydrOXYzine  (VISTARIL) 25 MG capsule Take 50 mg by mouth at bedtime.    Yes Historical Provider, MD  Multiple Vitamin (MULITIVITAMIN WITH MINERALS) TABS Take 1 tablet by mouth every evening.    Yes Historical Provider, MD  naproxen (NAPROSYN) 375 MG tablet Take 375 mg by mouth 2 (two) times daily with a meal.   Yes Historical Provider, MD  oxybutynin (DITROPAN) 5 MG tablet Take 5 mg by mouth 3 (three) times daily as needed. For bladder spasms   Yes Historical Provider, MD  pentosan polysulfate (ELMIRON) 100 MG capsule Take 200 mg by mouth 2 (two) times daily before a meal.    Yes Historical Provider, MD  PRESCRIPTION MEDICATION Place 10 mg vaginally every evening. Diazepam suppository compounded by [facility]   Yes Historical Provider, MD  pseudoephedrine (SUDAFED) 30 MG tablet Take 60 mg by mouth every 4 (four) hours.   Yes Historical Provider, MD  Simethicone 125 MG CAPS Take 125 mg by mouth 4 (four) times daily.   Yes Historical Provider, MD  traMADol (ULTRAM) 50 MG tablet Take 50 mg by mouth every 6 (six) hours. For pain   Yes Historical Provider, MD  valACYclovir (VALTREX) 500  MG tablet Take 500 mg by mouth at bedtime.    Yes Historical Provider, MD   Physical Exam: Filed Vitals:   12/14/11 1258 12/14/11 1348 12/14/11 1642  BP: 123/79 116/76   Pulse: 94 100 101  Temp: 99.2 F (37.3 C)  99.2 F (37.3 C)  TempSrc: Oral  Oral  Resp: 20 16 13   SpO2: 100% 100% 99%     General:  Well-developed well-nourished anxious appearing in no acute cardiopulmonary distress  Eyes: Pupils equal round and reactive to light and accommodation. Extraocular movements intact.  ENT: Oropharynx is clear, no lesions, no exudates.  Neck: Supple with no lymphadenopathy.  Cardiovascular: Tachycardic regular rhythm. No JVD. No lower extremity edema.  Respiratory: Clear to auscultation bilaterally. No wheezes, no crackles, rhonchi  Abdomen: Soft, nontender, nondistended, positive bowel sounds  Skin: No rashes  or lesions  Musculoskeletal: 5/5 BUE strength, 5/5 BLE STRENGTH  Psychiatric: nL MOOD, normal affect. Fair insight. fAIR judgment  Neurologic: N oriented x3. Cranial nerves II through XII are grossly intact. No focal deficits. Sensation is intact. Strength is intact. Gait not tested secondary to safety.  Labs on Admission:  Basic Metabolic Panel:  Lab 12/14/11 1610  NA 137  K 4.2  CL 97  CO2 31  GLUCOSE 92  BUN 9  CREATININE 0.66  CALCIUM 9.9  MG --  PHOS --   Liver Function Tests: No results found for this basename: AST:5,ALT:5,ALKPHOS:5,BILITOT:5,PROT:5,ALBUMIN:5 in the last 168 hours No results found for this basename: LIPASE:5,AMYLASE:5 in the last 168 hours No results found for this basename: AMMONIA:5 in the last 168 hours CBC:  Lab 12/14/11 1304  WBC 9.8  NEUTROABS 6.6  HGB 13.7  HCT 39.8  MCV 88.2  PLT 342   Cardiac Enzymes: No results found for this basename: CKTOTAL:5,CKMB:5,CKMBINDEX:5,TROPONINI:5 in the last 168 hours  BNP (last 3 results) No results found for this basename: PROBNP:3 in the last 8760 hours CBG: No results found for this basename: GLUCAP:5 in the last 168 hours  Radiological Exams on Admission: No results found.  EKG: Independently reviewed. None  Assessment/Plan Principal Problem:  *Thrombosis Active Problems:  SOB (shortness of breath) on exertion  Factor 5 Leiden mutation, heterozygous  Anxiety  Fibromyalgia  #1 acute thrombosis of the right could not alignment with extension of thrombus into IVC to the level of right renal vein Per CT scan of 12/12/2011 from [facility]. Likely secondary to hypercoagulable state as patient does have a history of factor V Leiden mutation heterozygous, family history of DVTs, patient is status post recent vaginal hysterectomy July of 2013. Patient has never been on anticoagulations before and has only been on aspirin. Patient with complaints of shortness of  breath on exertion and palpitations. Will get a CT scan of the chest to rule out PE. Will get a CT scan of the abdomen and pelvis. Will get lower extremity Dopplers to rule out DVT. Will place on IV heparin. Will consult with vascular surgery and interventional radiology for further evaluation and recommendations. Patient will likely need to be on anticoagulations of Coumadin or the new anticoagulants like xarelto or pradaxa. If patient is placed on Coumadin will likely need to be therapeutic prior to discharge. Follow.  #2 shortness of breath Worrisome for probable PE with recent diagnosis of thrombosis and history of factor V Leiden mutation and family history of DVTs. CT angiogram of the chest is pending. Will check a EKG. Follow.  #3 anxiety/fibromyalgia Continue home  dose of Cymbalta and diazepam.  #4 prophylaxis On full dose heparin.    Code Status: Full Family Communication: Updated patient and husband at bedside Disposition Plan: Admit to the step down unit  Time spent: 75 mins  [facility] Pager [phone]  If 7PM-7AM, please contact night-coverage [email or web address] Password [facility] 12/14/2011, 4:54 PM

## 2011-12-15 DIAGNOSIS — D6859 Other primary thrombophilia: Secondary | ICD-10-CM

## 2011-12-15 DIAGNOSIS — R229 Localized swelling, mass and lump, unspecified: Secondary | ICD-10-CM

## 2011-12-15 DIAGNOSIS — I82409 Acute embolism and thrombosis of unspecified deep veins of unspecified lower extremity: Principal | ICD-10-CM

## 2011-12-15 DIAGNOSIS — R319 Hematuria, unspecified: Secondary | ICD-10-CM | POA: Diagnosis present

## 2011-12-15 DIAGNOSIS — R109 Unspecified abdominal pain: Secondary | ICD-10-CM

## 2011-12-15 DIAGNOSIS — N3010 Interstitial cystitis (chronic) without hematuria: Secondary | ICD-10-CM | POA: Diagnosis present

## 2011-12-15 DIAGNOSIS — I80299 Phlebitis and thrombophlebitis of other deep vessels of unspecified lower extremity: Secondary | ICD-10-CM

## 2011-12-15 DIAGNOSIS — I82220 Acute embolism and thrombosis of inferior vena cava: Secondary | ICD-10-CM

## 2011-12-15 LAB — COMPREHENSIVE METABOLIC PANEL
ALT: 16 U/L (ref 0–35)
Alkaline Phosphatase: 32 U/L — ABNORMAL LOW (ref 39–117)
BUN: 6 mg/dL (ref 6–23)
CO2: 29 mEq/L (ref 19–32)
Chloride: 102 mEq/L (ref 96–112)
GFR calc Af Amer: >90 mL/min (ref >90)
GFR calc non Af Amer: >90 mL/min (ref >90)
Glucose, Bld: 99 mg/dL (ref 70–99)
Potassium: 3.6 mEq/L (ref 3.5–5.1)
Sodium: 138 mEq/L (ref 135–145)
Total Bilirubin: 0.3 mg/dL (ref 0.3–1.2)
Total Protein: 6.5 g/dL (ref 6.0–8.3)

## 2011-12-15 LAB — MAGNESIUM: Magnesium: 2.1 mg/dL (ref 1.5–2.5)

## 2011-12-15 LAB — CBC
HCT: 35.2 % — ABNORMAL LOW (ref 36.0–46.0)
Hemoglobin: 12.1 g/dL (ref 12.0–15.0)
MCHC: 34.4 g/dL (ref 30.0–36.0)
RBC: 4.01 MIL/uL (ref 3.87–5.11)

## 2011-12-15 MED ORDER — WARFARIN - PHARMACIST DOSING INPATIENT: Freq: Every day | Status: DC

## 2011-12-15 MED ORDER — SODIUM CHLORIDE 0.9 % IV SOLN
INTRAVENOUS | Status: DC
  Administered 2011-12-15: 75 mL via INTRAVENOUS
  Administered 2011-12-15 – 2011-12-17 (×3): via INTRAVENOUS

## 2011-12-15 MED ORDER — WARFARIN SODIUM 7.5 MG PO TABS
7.5000 mg | ORAL_TABLET | Freq: Once | ORAL | Status: DC
  Filled 2011-12-15: qty 1

## 2011-12-15 MED ORDER — RIVAROXABAN 15 MG PO TABS
15.0000 mg | ORAL_TABLET | Freq: Two times a day (BID) | ORAL | Status: DC
  Administered 2011-12-15 – 2011-12-17 (×5): 15 mg via ORAL
  Filled 2011-12-15 (×7): qty 1

## 2011-12-15 MED ORDER — KETOROLAC TROMETHAMINE 30 MG/ML IJ SOLN
30.0000 mg | Freq: Once | INTRAMUSCULAR | Status: AC
  Administered 2011-12-15: 30 mg via INTRAVENOUS
  Filled 2011-12-15: qty 1

## 2011-12-15 MED ORDER — FAMOTIDINE 40 MG PO TABS
40.0000 mg | ORAL_TABLET | Freq: Once | ORAL | Status: AC
  Administered 2011-12-15: 40 mg via ORAL
  Filled 2011-12-15: qty 1

## 2011-12-15 NOTE — Progress Notes (Signed)
VASCULAR LAB PRELIMINARY  PRELIMINARY  PRELIMINARY  PRELIMINARY  Bilateral lower extremity venous duplex  completed.    Preliminary report:  Bilateral:  No evidence of DVT, superficial thrombosis, or Baker's Cyst.    Makynzie Dobesh, RVT 12/15/2011, 10:50 AM

## 2011-12-15 NOTE — Progress Notes (Signed)
[facility] Progress Note [place] TEAM 1 - Stepdown/ICU TEAM   TOCCARA [name] RUE:[id number] DOB: Aug 10, 1970 DOA: 12/14/2011 PCP: Yves Dill, MD  Brief narrative: 41 year old female patient with past history of factor V Leiden mutation and a family history of DVTs. She had previously not been a candidate for anticoagulation because of severe meta-menorrhagia. She had undergone a laparoscopic assisted vaginal hysterectomy in July 2013. She also has a history of recurrent UTIs/interstitial cystitis with apparent recurrent hematuria. She apparently had gone to her primary care physician's office on the day of admission because of various complaints including nervousness, palpitations and shortness of breath on exertion. She had previously been evaluated in the emergency department at [facility] on 12/12/2011 and had undergone a CT scan of the abdomen and pelvis. These results apparently demonstrated a thrombosis of the right gonadal vein with extension of the thrombus into the IVC at the level of the right renal vein. These results were eventually transmitted to the patient's urologist at [facility] who recommended she present to the emergency department for further evaluation and treatment. Since the patient lives in [place] she presented to [facility] ER. In the emergency department here exam and evaluation was unremarkable. The emergency physician began full dose Lovenox and oral Coumadin and the hospitalist were called to admit. She was subsequently admitted to the step down unit for further evaluation and treatment  Assessment/Plan:  Acute DVT (deep venous thrombosis)-right gonadal/Factor 5 Leiden mutation, heterozygous *Appreciate vascular physician assessment. He reviewed the CT scan and noted an acute thrombus in the right ovarian vein with a small extension into the inferior vena cava. This area is very small and he does not feel there is any indication  to proceed with thrombolytic therapy or mechanical thrombectomy. The thrombus occurred without the benefit of anticoagulation - the recommendations are to begin full dose anticoagulation.  *Because of her history of apparent recurrent hematuria it is felt she needs to be monitored for recurrence of hematuria while acutely anticoagulated. *If hematuria becomes a significant problem consideration would be given to a suprarenal IVC filter and Urology consultation, but recommendation is to do everything possible to avoid placing a suprarenal filter *We have asked pharmacy to help [place] transition from heparin and Coumadin to Xarelto- Medicaid will pay for this medication *extremity Dopplers are negative for DVT  SOB (shortness of breath) on exertion *CT angiogram reveals no evidence of pulmonary embolism  Interstitial cystitis/history of Hematuria *Urinalysis at time of admission revealed no evidence of hematuria  *Continue to monitor for hematuria while initiating anti-coagulation  Anxiety/Fibromyalgia/severe headache *Continue home medication *Give one-time dose of Toradol to see if this will break the headache *Patient has issues with chronic musculoskeletal pain in the neck and associated tension headaches and has requested a K pad  DVT prophylaxis: On full dose anticoagulation Code Status: Full Family Communication: Spoke with patient Disposition Plan: Transfer to medical bed  Consultants: Vascular surgery  Procedures: None  Antibiotics: None  HPI/Subjective: Patient endorses that previous shortness of breath has resolved. Primary complaint is of neck pain and severe headache that has been present for 8 days causing her to not be able to sleep.   Objective: Blood pressure 117/69, pulse 84, temperature 97.9 F (36.6 C), temperature source Oral, resp. rate 19, height 5\' 3"  (1.6 m), weight 85.9 kg (189 lb 6 oz), last menstrual period 05/20/2011, SpO2 100.00%.  Intake/Output Summary  (Last 24 hours) at 12/15/11 1334 Last data filed at 12/15/11  0900  Gross per 24 hour  Intake   1208 ml  Output   1701 ml  Net   -493 ml     Exam: General: No acute respiratory distress Lungs: Clear to auscultation bilaterally without wheezes or crackles Cardiovascular: Regular rate and rhythm without murmur gallop or rub normal S1 and S2 Abdomen: Nontender, nondistended, soft, bowel sounds positive, no rebound, no ascites, no appreciable mass Musculoskeletal: No significant cyanosis, clubbing, or edema bilateral lower extremities; upper shoulders and neck muscles are tight and tender to palpation Neurological: Alert and oriented x3, moves all extremities x4 without any appreciable weakness or deficits, exam non focal  Data Reviewed: Basic Metabolic Panel:  Lab 12/15/11 6962 12/14/11 1304  NA 138 137  K 3.6 4.2  CL 102 97  CO2 29 31  GLUCOSE 99 92  BUN 6 9  CREATININE 0.67 0.66  CALCIUM 8.8 9.9  MG 2.1 --  PHOS -- --   Liver Function Tests:  Lab 12/15/11 0314  AST 19  ALT 16  ALKPHOS 32*  BILITOT 0.3  PROT 6.5  ALBUMIN 3.6   CBC:  Lab 12/15/11 0314 12/14/11 1304  WBC 7.8 9.8  NEUTROABS -- 6.6  HGB 12.1 13.7  HCT 35.2* 39.8  MCV 87.8 88.2  PLT 274 342    Recent Results (from the past 240 hour(s))  MRSA PCR SCREENING     Status: Abnormal   Collection Time   12/14/11  7:31 PM      Component Value Range Status Comment   MRSA by PCR POSITIVE (*) NEGATIVE Final      Studies:  Recent x-ray studies have been reviewed in detail by the Attending Physician  Scheduled Meds:  Reviewed in detail by the Attending Physician   Junious Silk, ANP [facility] Office  [phone] Pager [phone]  On-Call/Text Page:      [email or web address]      password [email or web address]  If 7PM-7AM, please contact night-coverage [email or web address] Password [email or web address] 12/15/2011, 1:34 PM   LOS: 1 day   I have personally examined this patient and reviewed the entire database. I have reviewed  the above note, made any necessary editorial changes, and agree with its content.  Lonia Blood, MD [facility]

## 2011-12-15 NOTE — Evaluation (Signed)
Physical Therapy Evaluation Patient Details Name: April [name] MRN: [id number] DOB: 1970/07/21 Today's Date: 12/15/2011 Time: 7829-5621 PT Time Calculation (min): 29 min  PT Assessment / Plan / Recommendation Clinical Impression  41 year old female patient with past history of factor V Leiden mutation and a family history of DVTs. Admitted to [facility] for acute DVT. Presents to PT today with slight gait and balance impairments impacting functional independence. Will benefit physical therapy in the acute setting to maxmize safety in prep for d/c home with f/u at [facility].     PT Assessment  Patient needs continued PT services    Follow Up Recommendations  Outpatient PT    Does the patient have the potential to tolerate intense rehabilitation      Barriers to Discharge        Equipment Recommendations  None recommended by PT    Recommendations for Other Services     Frequency Min 3X/week    Precautions / Restrictions Precautions Precautions: Fall Precaution Comments: reports a lot of falls at home? Restrictions Weight Bearing Restrictions: No         Mobility  Bed Mobility Bed Mobility: Supine to Sit Supine to Sit: HOB elevated;6: Modified independent (Device/Increase time) (50 degrees) Transfers Transfers: Sit to Stand;Stand to Sit Sit to Stand: 6: Modified independent (Device/Increase time);From bed;With upper extremity assist Stand to Sit: 6: Modified independent (Device/Increase time);To bed;With upper extremity assist Ambulation/Gait Ambulation/Gait Assistance: 5: Supervision Ambulation Distance (Feet): 400 Feet Assistive device: None Ambulation/Gait Assistance Details: gaurding for occassional instability but pt able to correct independently Gait Pattern: Wide base of support;Decreased stride length;Antalgic Gait velocity: slow and cautious (baseline) General Gait Details: pt with c/o right knee popping and "joint problems" for a while that they haven't been able to  identify Stairs: No              PT Diagnosis: Difficulty walking;Abnormality of gait;Acute pain  PT Problem List: Decreased activity tolerance;Decreased balance PT Treatment Interventions: Gait training;Stair training;Functional mobility training;Therapeutic activities;Therapeutic exercise;Balance training;Neuromuscular re-education;Patient/family education   PT Goals Acute Rehab PT Goals PT Goal Formulation: With patient Time For Goal Achievement: 12/22/11 Potential to Achieve Goals: Good Pt will Ambulate: with modified independence;>150 feet PT Goal: Ambulate - Progress: Goal set today Pt will Go Up / Down Stairs: Flight;with modified independence;with rail(s) PT Goal: Up/Down Stairs - Progress: Goal set today  Visit Information  Last PT Received On: 12/15/11 Assistance Needed: +1    Subjective Data  Subjective: My knee popped because I am seen a rheumatologist.    Prior Functioning  Home Living Lives With: Spouse Available Help at Discharge: Family Type of Home: House Home Access: Stairs to enter [employer] of Steps: did not get the specific number because pt talking a lot Home Layout: Two level;Bed/bath upstairs Alternate Level Stairs-Number of Steps: 1 flight Home Adaptive Equipment: Walker - rolling Additional Comments: doesn't use RW but has one from broken foot  Prior Function Level of Independence: Independent Able to Take Stairs?: Yes Driving: Yes Vocation: On disability Communication Communication: No difficulties    Cognition  Overall Cognitive Status: Appears within functional limits for tasks assessed/performed Arousal/Alertness: Awake/alert Orientation Level: Appears intact for tasks assessed Behavior During Session: Anxious Cognition - Other Comments: talkative    Extremity/Trunk Assessment Right Upper Extremity Assessment RUE ROM/Strength/Tone: [facility] for tasks assessed Left Upper Extremity Assessment LUE ROM/Strength/Tone: WFL for  tasks assessed Right Lower Extremity Assessment RLE ROM/Strength/Tone: WFL for tasks assessed RLE Sensation: Deficits RLE Sensation Deficits: pt  reports of fluctuation n/t to bilateral feet RLE Coordination: WFL - gross/fine motor Left Lower Extremity Assessment LLE ROM/Strength/Tone: WFL for tasks assessed LLE Sensation: Deficits LLE Sensation Deficits: pt reports of fluctuation n/t to bilateral feet LLE Coordination: WFL - gross/fine motor   Balance Balance Balance Assessed: Yes Static Standing Balance Single Leg Stance - Right Leg: 3  Single Leg Stance - Left Leg: 3  Rhomberg - Eyes Opened: 60  Rhomberg - Eyes Closed: 30   End of Session PT - End of Session Equipment Utilized During Treatment: Gait belt Activity Tolerance: Patient tolerated treatment well Patient left: in bed;with call bell/phone within reach;with nursing in room Nurse Communication: Mobility status  GP     [facility] HELEN 12/15/2011, 4:09 PM

## 2011-12-15 NOTE — Evaluation (Signed)
Occupational Therapy Evaluation Patient Details Name: April [name] MRN: [id number] DOB: 1971-01-16 Today's Date: 12/15/2011 Time: 6295-2841 OT Time Calculation (min): 29 min  OT Assessment / Plan / Recommendation Clinical Impression  41 year old female patient with past history of factor V Leiden mutation and a family history of DVTs. Admitted to [facility] for acute DVT.  Pt. is currently supervision with all BADLs, and anticipate she will progress to modified independent level quickly. She however, reports having multiple medical issues over the past year and a loss of independence with IADLs due to generalized weakness.  No acute OT recommended, but recommend OPOT    OT Assessment  All further OT needs can be met in the next venue of care    Follow Up Recommendations  Outpatient OT;Supervision/Assistance - 24 hour    Barriers to Discharge      Equipment Recommendations  None recommended by PT;None recommended by OT    Recommendations for Other Services    Frequency       Precautions / Restrictions Precautions Precautions: Fall Precaution Comments: reports a lot of falls at home? (Pt. pt. unable to specify when, and states not recent) Restrictions Weight Bearing Restrictions: No       ADL  Eating/Feeding: Independent Where Assessed - Eating/Feeding: Bed level Grooming: Wash/dry hands;Wash/dry face;Teeth care;Supervision/safety Where Assessed - Grooming: Unsupported standing Upper Body Bathing: Set up Where Assessed - Upper Body Bathing: Unsupported sitting Lower Body Bathing: Set up Where Assessed - Lower Body Bathing: Unsupported sit to stand Upper Body Dressing: Set up Where Assessed - Upper Body Dressing: Unsupported sitting Lower Body Dressing: Set up Where Assessed - Lower Body Dressing: Unsupported sit to stand Toilet Transfer: Supervision/safety Toilet Transfer Method: Sit to Barista: Regular height toilet Toileting - Clothing Manipulation  and Hygiene: Supervision/safety Where Assessed - Engineer, mining and Hygiene: Standing Tub/Shower Transfer: Therapist, sports Method: Science writer: Walk in shower Transfers/Ambulation Related to ADLs: ambulates with supervision ADL Comments: Pt able to complete with supervision    OT Diagnosis: Generalized weakness  OT Problem List: Decreased strength;Decreased activity tolerance OT Treatment Interventions:     OT Goals    Visit Information  Last OT Received On: 12/15/11 Assistance Needed: +1    Subjective Data  Subjective: "I don't know why I keep feeling short of breath" Patient Stated Goal: To get better   Prior Functioning     Home Living Lives With: Spouse;Daughter (3 dtrs youngest is 3) Available Help at Discharge: Family Type of Home: House Home Access: Stairs to enter [employer] of Steps: did not get the specific number because pt talking a lot Home Layout: Two level;Bed/bath upstairs Alternate Level Stairs-Number of Steps: 1 flight Bathroom Shower/Tub: Health visitor: Standard Home Adaptive Equipment: Walker - rolling Additional Comments: doesn't use RW but has one from broken foot  Prior Function Level of Independence: Independent Able to Take Stairs?: Yes Driving: Yes Vocation: On disability Communication Communication: No difficulties Dominant Hand: Right         Vision/Perception     Cognition  Overall Cognitive Status: Appears within functional limits for tasks assessed/performed Arousal/Alertness: Awake/alert Orientation Level: Appears intact for tasks assessed Behavior During Session: Anxious Cognition - Other Comments: talkative.  Pt. focused on ailments/illnesses    Extremity/Trunk Assessment Right Upper Extremity Assessment RUE ROM/Strength/Tone: WFL for tasks assessed RUE Sensation: Deficits RUE Sensation Deficits: Pt. reports variable  sensory loss bil. hands.  She initially reports numbness on dorsal aspect  of hands, but then later states she can't feel the covers.  However, sensory loss did not effect her ability to manipulate ADL objects RUE Coordination: Deficits RUE Coordination Deficits: tremulous Left Upper Extremity Assessment LUE ROM/Strength/Tone: WFL for tasks assessed LUE Sensation: Deficits LUE Sensation Deficits: Pt. reports variable sensory loss bil. hands.  She initially reports numbness on dorsal aspect of hands, but then later states she can't feel the covers.  However, sensory loss did not effect her ability to manipulate ADL objects LUE Coordination: Deficits LUE Coordination Deficits: tremulouls Right Lower Extremity Assessment RLE ROM/Strength/Tone: WFL for tasks assessed RLE Sensation: Deficits RLE Sensation Deficits: pt reports of fluctuation n/t to bilateral feet RLE Coordination: WFL - gross/fine motor Left Lower Extremity Assessment LLE ROM/Strength/Tone: WFL for tasks assessed LLE Sensation: Deficits LLE Sensation Deficits: pt reports of fluctuation n/t to bilateral feet LLE Coordination: WFL - gross/fine motor Trunk Assessment Trunk Assessment: Normal     Mobility Bed Mobility Bed Mobility: Supine to Sit Supine to Sit: HOB elevated;6: Modified independent (Device/Increase time) Details for Bed Mobility Assistance: Pt insisted on raising HOB to max level to prevent her from sliding out Transfers Transfers: Sit to Stand;Stand to Sit Sit to Stand: 6: Modified independent (Device/Increase time);From bed;With upper extremity assist Stand to Sit: 6: Modified independent (Device/Increase time);To bed;With upper extremity assist     Shoulder Instructions     Exercise     Balance Balance Balance Assessed: Yes Static Standing Balance Single Leg Stance - Right Leg: 3  Single Leg Stance - Left Leg: 3  Rhomberg - Eyes Opened: 60  Rhomberg - Eyes Closed: 30    End of Session OT - End of  Session Activity Tolerance: Patient tolerated treatment well Patient left: in bed;with call bell/phone within reach;with nursing in room Nurse Communication: Other (comment) (IV leaking)  GO     Latrelle Bazar M 12/15/2011, 7:06 PM

## 2011-12-15 NOTE — Progress Notes (Signed)
ANTICOAGULATION CONSULT NOTE - Follow Up   Pharmacy Consult for:  Heparin  Indication: VTE treatment  Allergies  Allergen Reactions  . Amitriptyline Other (See Comments)    Change in mental status  . Bromelains (Pineapple Extract) Other (See Comments)    Severe pain  . Celexa (Citalopram Hydrobromide) Other (See Comments)    Change in mental status  . Methylene Blue Other (See Comments)    Caused inflamed pancreas, per pt should not use  . Sertraline Other (See Comments)    Suicidal thoughts  . Sulfonamide Derivatives Other (See Comments)    excrutiating pain, patient states it caused her to have osteoarthritis  . Bromaline (Albertsons Di Bromm) Rash  . Other Rash    Mango  . Penicillins Swelling and Rash   Patient Measurements: Weight = 85.9 kg Height = 5'3" IBW = 52.4 kg Heparin Dosing Weight 72 kg  Labs:  Basename 12/15/11 0314 12/14/11 1304  HGB 12.1 13.7  HCT 35.2* 39.8  PLT 274 342  APTT -- 35  LABPROT 14.3 13.0  INR 1.13 0.99  HEPARINUNFRC 1.14* --  CREATININE 0.67 0.66  CKTOTAL -- --  CKMB -- --  TROPONINI -- --   Estimated Creatinine Clearance: 97.1 ml/min (by C-G formula based on Cr of 0.67).  Medical History: Past Medical History  Diagnosis Date  . Fibromyalgia   . Pancreatitis   . Interstitial cystitis   . Pelvic floor dysfunction   . Sinus infection     chronic  . Factor 5 Leiden mutation, heterozygous   . Fracture of 5th metatarsal 03/2011    left foot -    . Ovarian cyst   . Asthma     no inhaler  . 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  [name] Anxiety 12/14/2011   Assessment: 41 year old female admitted with anxiety, numbness in face, and clotting disorder (factor 5 mutation).  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  Patient received enoxaparin 85 mg x 1 at 15:49PM and warfarin 7.5 mg on 11/14.  Patient was then switched to IV heparin started at 18:50. Today is day 2 overlap of warfarin and heparin. Heparin level (1.14) is above-goal, possibly due to both residual enoxaparin and heparin rate. Confirmed with RN that lab was drawn appropriately and patient without signs/symptoms of bleeding. INR is below-goal after receiving warfarin 7.5mg  yesterday.   Goal of Therapy:  Heparin level 0.3-0.7 Monitor platelets by anticoagulation protocol: Yes   Plan:  1. Conservatively decrease IV heparin to 1100 units/hr. 2. Heparin level in 6 hours. 3. Coumadin 7.5mg  po today. 4. Daily PT / INR  Lorre Munroe, PharmD 12/15/2011,5:26 AM

## 2011-12-15 NOTE — ED Provider Notes (Signed)
I saw and evaluated the patient, reviewed the resident's note and I agree with the findings and plan. Patient sent in with gonadal vein thrombosis for admission. Admitted to medicine  April [name]. Rubin Payor, MD 12/15/11 1045

## 2011-12-15 NOTE — Progress Notes (Signed)
ANTICOAGULATION CONSULT NOTE - Follow Up   Pharmacy Consult for:  Xarelto  Indication: VTE treatment  Allergies  Allergen Reactions  . Amitriptyline Other (See Comments)    Change in mental status  . Bromelains (Pineapple Extract) Other (See Comments)    Severe pain  . Celexa (Citalopram Hydrobromide) Other (See Comments)    Change in mental status  . Methylene Blue Other (See Comments)    Caused inflamed pancreas, per pt should not use  . Sertraline Other (See Comments)    Suicidal thoughts  . Sulfonamide Derivatives Other (See Comments)    excrutiating pain, patient states it caused her to have osteoarthritis  . Bromaline (Albertsons Di Bromm) Rash  . Other Rash    Mango  . Penicillins Swelling and Rash   Patient Measurements: Weight = 85.9 kg Height = 5'3" IBW = 52.4 kg Heparin Dosing Weight 72 kg  Labs:  Basename 12/15/11 0314 12/14/11 1304  HGB 12.1 13.7  HCT 35.2* 39.8  PLT 274 342  APTT -- 35  LABPROT 14.3 13.0  INR 1.13 0.99  HEPARINUNFRC 1.14* --  CREATININE 0.67 0.66  CKTOTAL -- --  CKMB -- --  TROPONINI -- --   Estimated Creatinine Clearance: 97.1 ml/min (by C-G formula based on Cr of 0.67).  Medical History: Past Medical History  Diagnosis Date  . Fibromyalgia   . Pancreatitis   . Interstitial cystitis   . Pelvic floor dysfunction   . Sinus infection     chronic  . Factor 5 Leiden mutation, heterozygous   . Fracture of 5th metatarsal 03/2011    left foot -    . Ovarian cyst   . Asthma     no inhaler  . 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  [name] Anxiety 12/14/2011   Assessment: 41 year old female admitted with anxiety, numbness in face, and clotting disorder (factor 5 mutation).  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  Patient received enoxaparin 85 mg x 1 at 15:49PM and warfarin 7.5 mg on 11/14.  Patient was then switched to IV heparin started at 18:50. Now transition to Xarelto.    Plan:  1. Dc heparin/coumadin 2. Xarelto 15mg  PO bid x3weeks then 20mg  PO qday

## 2011-12-15 NOTE — Progress Notes (Signed)
Report called to [facility] on unit 6700. Patient with husband at bedside made aware of transfer. Patient transferred to bed 6712 via wheelchair.

## 2011-12-16 ENCOUNTER — Inpatient Hospital Stay (HOSPITAL_COMMUNITY): Payer: Medicaid Other

## 2011-12-16 DIAGNOSIS — R209 Unspecified disturbances of skin sensation: Secondary | ICD-10-CM

## 2011-12-16 LAB — CBC
MCH: 30.5 pg (ref 26.0–34.0)
MCV: 89.5 fL (ref 78.0–100.0)
Platelets: 255 10*3/uL (ref 150–400)
RBC: 3.61 MIL/uL — ABNORMAL LOW (ref 3.87–5.11)

## 2011-12-16 LAB — URINE CULTURE: Colony Count: 10000

## 2011-12-16 MED ORDER — DIPHENHYDRAMINE HCL 50 MG/ML IJ SOLN
25.0000 mg | Freq: Once | INTRAMUSCULAR | Status: AC
  Administered 2011-12-16: 25 mg via INTRAVENOUS
  Filled 2011-12-16: qty 1

## 2011-12-16 MED ORDER — KETOROLAC TROMETHAMINE 30 MG/ML IJ SOLN: 30.0000 mg | Freq: Once | INTRAMUSCULAR | Status: DC

## 2011-12-16 MED ORDER — DIAZEPAM 5 MG/ML IJ SOLN
5.0000 mg | Freq: Once | INTRAMUSCULAR | Status: AC
  Administered 2011-12-16: 5 mg via INTRAVENOUS
  Filled 2011-12-16: qty 2

## 2011-12-16 MED ORDER — TRAMADOL HCL 50 MG PO TABS
50.0000 mg | ORAL_TABLET | Freq: Four times a day (QID) | ORAL | Status: DC
  Administered 2011-12-16 – 2011-12-17 (×4): 50 mg via ORAL
  Filled 2011-12-16 (×9): qty 1

## 2011-12-16 MED ORDER — GADOBENATE DIMEGLUMINE 529 MG/ML IV SOLN
18.0000 mL | Freq: Once | INTRAVENOUS | Status: AC | PRN
  Administered 2011-12-16: 18 mL via INTRAVENOUS

## 2011-12-16 MED ORDER — KETOROLAC TROMETHAMINE 30 MG/ML IJ SOLN
30.0000 mg | Freq: Once | INTRAMUSCULAR | Status: AC
  Administered 2011-12-16: 30 mg via INTRAVENOUS
  Filled 2011-12-16 (×2): qty 1

## 2011-12-16 NOTE — Progress Notes (Signed)
[facility] PROGRESS NOTE  April [name] ZOX:[id number] DOB: Jul 10, 1970 DOA: 12/14/2011 PCP: Yves Dill, MD  Assessment/Plan: Acute DVT right gonadal vein/factor V Leiden -Continue rivaroxaban -No signs of hematuria Sensory disturbance -Transient Dysesthesias for months -MRI brain -No other focal neurologic deficits -Question B12 deficiency--patient already on supplementation Interstitial cystitis -Clinically stable -Continue Elmiron -UA shows no evidence of hematuria Fibromyalgia/anxiety -Continue Cymbalta, Vistaril     Disposition Plan:   Home if MRI brain is negative      Procedures/Studies: Ct Angio Chest Pe W/cm &/or Wo Cm  12/14/2011  *RADIOLOGY REPORT*  Clinical Data:  Right gonadal vein thrombus with IVC extension, history of factor five Lieden mutation, chest pain, palpitations, abdominal pain  CT ANGIOGRAPHY CHEST WITH CONTRAST  Technique:  Multidetector CT imaging of the chest was performed using the standard protocol during bolus administration of intravenous contrast.  Multiplanar CT image reconstructions including MIPs were obtained to evaluate the vascular anatomy.  Contrast: OMNIPAQUE IOHEXOL 350 MG/ML SOLN  Comparison:  None Available  Findings:  Limited opacification of the pulmonary vasculature.  No significant central or proximal hilar filling defect or pulmonary embolus demonstrated.  The distal segmental and subsegmental branches are not well visualized to completely exclude small emboli.  Residual thymic tissue in the anterior mediastinum with a triangular configuration.  Thoracic aorta appears intact.  Negative for aneurysm or dissection.  Normal heart size.  The patient is status post Nissen fundoplication.  No significant recurrent hernia.  Lung windows demonstrate no focal airspace process, collapse, contusion, consolidation or interstitial process.  Central airways and trachea patent.  No suspicious pulmonary nodule or mass.  No acute or  abnormal osseous finding.  Review of the MIP images confirms the above findings.  IMPRESSION: No significant large central or proximal hilar pulmonary embolus demonstrated within the limits of the study.  No acute intrathoracic process.  Prior Nissen fundoplication  CT ABDOMEN AND PELVIS WITH CONTRAST  Technique:  Multidetector CT imaging of the abdomen and pelvis was performed using the standard protocol following bolus administration of intravenous contrast.  Contrast: OMNIPAQUE IOHEXOL 350 MG/ML SOLN  Comparison:  None available  Findings:  Prior cholecystectomy noted.  Liver, biliary system, pancreas, spleen, adrenal glands, and kidneys are within normal limits and demonstrate no acute process.  Right gonadal vein demonstrates a hypodense intraluminal thrombus proximally with minimal surrounding strandy edema and enhancement. A small amount of thrombus extends into the IVC lumen without significant luminal narrowing or occlusive process.  Prior Nissen fundoplication changes noted.  No recurrent hernia. Negative for bowel obstruction, dilatation, ileus, or free air. Normal appendix demonstrated.  No abdominal free fluid, fluid collection, hemorrhage, adenopathy, or abscess.  Pelvis:  Prior hysterectomy noted.  Urinary bladder unremarkable. No pelvic free fluid, fluid collection, hemorrhage, abscess, adenopathy, or inguinal abnormality.  No hernia.  No acute distal bowel process.  No acute or abnormal osseous finding.  IMPRESSION: Acute appearing right gonadal vein thrombosis with a small "finger like" extension into the IVC.  IVC lumen appears patent.  Prior Nissen fundoplication, cholecystectomy, and hysterectomy.  No other acute intra-abdominal process.   Original Report Authenticated By: Judie Petit. Miles Costain, M.D.    Ct Abdomen Pelvis W Contrast  12/14/2011  *RADIOLOGY REPORT*  Clinical Data:  Right gonadal vein thrombus with IVC extension, history of factor five Lieden mutation, chest pain, palpitations,  abdominal pain  CT ANGIOGRAPHY CHEST WITH CONTRAST  Technique:  Multidetector CT imaging of the chest was performed using the standard protocol during  bolus administration of intravenous contrast.  Multiplanar CT image reconstructions including MIPs were obtained to evaluate the vascular anatomy.  Contrast: OMNIPAQUE IOHEXOL 350 MG/ML SOLN  Comparison:  None Available  Findings:  Limited opacification of the pulmonary vasculature.  No significant central or proximal hilar filling defect or pulmonary embolus demonstrated.  The distal segmental and subsegmental branches are not well visualized to completely exclude small emboli.  Residual thymic tissue in the anterior mediastinum with a triangular configuration.  Thoracic aorta appears intact.  Negative for aneurysm or dissection.  Normal heart size.  The patient is status post Nissen fundoplication.  No significant recurrent hernia.  Lung windows demonstrate no focal airspace process, collapse, contusion, consolidation or interstitial process.  Central airways and trachea patent.  No suspicious pulmonary nodule or mass.  No acute or abnormal osseous finding.  Review of the MIP images confirms the above findings.  IMPRESSION: No significant large central or proximal hilar pulmonary embolus demonstrated within the limits of the study.  No acute intrathoracic process.  Prior Nissen fundoplication  CT ABDOMEN AND PELVIS WITH CONTRAST  Technique:  Multidetector CT imaging of the abdomen and pelvis was performed using the standard protocol following bolus administration of intravenous contrast.  Contrast: OMNIPAQUE IOHEXOL 350 MG/ML SOLN  Comparison:  None available  Findings:  Prior cholecystectomy noted.  Liver, biliary system, pancreas, spleen, adrenal glands, and kidneys are within normal limits and demonstrate no acute process.  Right gonadal vein demonstrates a hypodense intraluminal thrombus proximally with minimal surrounding strandy edema and  enhancement. A small amount of thrombus extends into the IVC lumen without significant luminal narrowing or occlusive process.  Prior Nissen fundoplication changes noted.  No recurrent hernia. Negative for bowel obstruction, dilatation, ileus, or free air. Normal appendix demonstrated.  No abdominal free fluid, fluid collection, hemorrhage, adenopathy, or abscess.  Pelvis:  Prior hysterectomy noted.  Urinary bladder unremarkable. No pelvic free fluid, fluid collection, hemorrhage, abscess, adenopathy, or inguinal abnormality.  No hernia.  No acute distal bowel process.  No acute or abnormal osseous finding.  IMPRESSION: Acute appearing right gonadal vein thrombosis with a small "finger like" extension into the IVC.  IVC lumen appears patent.  Prior Nissen fundoplication, cholecystectomy, and hysterectomy.  No other acute intra-abdominal process.   Original Report Authenticated By: Judie Petit. Miles Costain, M.D.          Subjective: Patient complains of transient numbness on her face, tongue, gums, right arm, right hand, right leg. No focal extremity weakness. Also complains of transient blurry vision. All his complaints have been chronic for many months. Denies dizziness or syncope. Denies any headache currently. Denies any fevers, chills, chest pain, shortness breath, vomiting, diarrhea, hematuria.  Objective: Filed Vitals:   12/15/11 1640 12/15/11 2153 12/16/11 0551 12/16/11 0800  BP: 121/71 108/73 119/70 122/76  Pulse: 70 91 86 87  Temp: 98.4 F (36.9 C) 97.8 F (36.6 C) 98.1 F (36.7 C) 99.2 F (37.3 C)  TempSrc:  Oral Oral Oral  Resp: 18 18 18 18   Height:      Weight:  85.8 kg (189 lb 2.5 oz)    SpO2: 99% 99% 99% 98%    Intake/Output Summary (Last 24 hours) at 12/16/11 0932 Last data filed at 12/15/11 2047  Gross per 24 hour  Intake      0 ml  Output   1700 ml  Net  -1700 ml   Weight change: -0.1 kg (-3.5 oz) Exam:   General:  Pt is alert,  follows commands appropriately, not in acute  distress  HEENT: No icterus, No thrush,  [place]/AT  Cardiovascular: RRR, S1/S2, no rubs, no gallops  Respiratory: CTA bilaterally, no wheezing, no crackles, no rhonchi  Abdomen: Soft/+BS, non tender, non distended, no guarding  Extremities: No edema, No lymphangitis, No petechiae, No rashes, no synovitis  Neurologic: Cranial nerves 2-12 grossly intact, no dysmetria, no dysdiadochokinesia, no sensory extinction, protopathic and epicritic sensation intact bilaterally  Data Reviewed: Basic Metabolic Panel:  Lab 12/15/11 1610 12/14/11 1304  NA 138 137  K 3.6 4.2  CL 102 97  CO2 29 31  GLUCOSE 99 92  BUN 6 9  CREATININE 0.67 0.66  CALCIUM 8.8 9.9  MG 2.1 --  PHOS -- --   Liver Function Tests:  Lab 12/15/11 0314  AST 19  ALT 16  ALKPHOS 32*  BILITOT 0.3  PROT 6.5  ALBUMIN 3.6   No results found for this basename: LIPASE:5,AMYLASE:5 in the last 168 hours No results found for this basename: AMMONIA:5 in the last 168 hours CBC:  Lab 12/16/11 0455 12/15/11 0314 12/14/11 1304  WBC 5.6 7.8 9.8  NEUTROABS -- -- 6.6  HGB 11.0* 12.1 13.7  HCT 32.3* 35.2* 39.8  MCV 89.5 87.8 88.2  PLT 255 274 342   Cardiac Enzymes: No results found for this basename: CKTOTAL:5,CKMB:5,CKMBINDEX:5,TROPONINI:5 in the last 168 hours BNP: No components found with this basename: POCBNP:5 CBG: No results found for this basename: GLUCAP:5 in the last 168 hours  Recent Results (from the past 240 hour(s))  MRSA PCR SCREENING     Status: Abnormal   Collection Time   12/14/11  7:31 PM      Component Value Range Status Comment   MRSA by PCR POSITIVE (*) NEGATIVE Final   URINE CULTURE     Status: Normal   Collection Time   12/14/11 10:47 PM      Component Value Range Status Comment   Specimen Description URINE, RANDOM   Final    Special Requests NONE   Final    Culture  Setup Time 12/15/2011 04:58   Final    Colony Count 10,000 COLONIES/ML   Final    Culture     Final    Value: Multiple  bacterial morphotypes present, none predominant. Suggest appropriate recollection if clinically indicated.   Report Status 12/16/2011 FINAL   Final      Scheduled Meds:   . Chlorhexidine Gluconate Cloth  6 each Topical Q0600  . cyclobenzaprine  10 mg Oral TID  . DULoxetine  60 mg Oral Q12H  . [COMPLETED] famotidine  40 mg Oral Once  . hydrOXYzine  50 mg Oral QHS  . [COMPLETED] ketorolac  30 mg Intravenous Once  . mupirocin ointment  1 application Nasal BID  . naproxen  375 mg Oral BID WC  . pentosan polysulfate  200 mg Oral BID AC  . pseudoephedrine  60 mg Oral QID  . rivaroxaban  15 mg Oral BID  . simethicone  80 mg Oral QID  . sodium chloride  3 mL Intravenous Q12H  . valACYclovir  500 mg Oral QHS  . vitamin B-12  5,000 mcg Oral Daily  . [DISCONTINUED] patient's guide to using coumadin book   Does not apply Once  . [DISCONTINUED] warfarin  7.5 mg Oral ONCE-1800  . [DISCONTINUED] Warfarin - Pharmacist Dosing Inpatient   Does not apply q1800   Continuous Infusions:   . sodium chloride 75 mL/hr at 12/15/11 2318  . [DISCONTINUED]  sodium chloride 1,000 mL (12/14/11 1845)  . [DISCONTINUED] heparin 1,100 Units/hr (12/15/11 0557)     Lillyn Wieczorek, DO  [facility] Pager [phone]  If 7PM-7AM, please contact night-coverage [email or web address] Password [email or web address] 12/16/2011, 9:32 AM   LOS: 2 days

## 2011-12-16 NOTE — Progress Notes (Signed)
Physical Therapy Treatment Patient Details Name: April [name] MRN: [id number] DOB: 01/02/1971 Today's Date: 12/16/2011 Time: 2841-3244 PT Time Calculation (min): 16 min  PT Assessment / Plan / Recommendation Comments on Treatment Session  Pt is 41 yo female with DVT who was having knee pain and instability yesterday which affected gait and safety, however, is much improved today and is mod I with mobility.  PT goals have been met and acute PT signing off.  However, since this knee issue is recurring and she has fallen because of it at home, recommend outpatient PT for high level strengthening.    Follow Up Recommendations  Outpatient PT     Does the patient have the potential to tolerate intense rehabilitation     Barriers to Discharge        Equipment Recommendations  None recommended by PT;None recommended by OT    Recommendations for Other Services    Frequency     Plan All goals met and education completed, patient dischaged from PT services    Precautions / Restrictions Restrictions Weight Bearing Restrictions: No   Pertinent Vitals/Pain No c/o pain during session    Mobility  Bed Mobility Bed Mobility: Supine to Sit;Sit to Supine Supine to Sit: 7: Independent Sit to Supine: 6: Modified independent (Device/Increase time) Transfers Transfers: Sit to Stand;Stand to Sit Sit to Stand: 7: Independent;From bed;From toilet Stand to Sit: 7: Independent;To bed;To toilet Details for Transfer Assistance: no assistance needed Ambulation/Gait Ambulation/Gait Assistance: 6: Modified independent (Device/Increase time) Ambulation Distance (Feet): 350 Feet Assistive device: None Ambulation/Gait Assistance Details: no instability with gait today, no LOB, speed WFL Gait Pattern: Within Functional Limits General Gait Details: though pt's knee is significantly better today, continue to recommend OP f/u for knee strengthening since this is a recurring problem Stairs: Yes Stairs  Assistance: 6: Modified independent (Device/Increase time) Stair Management Technique: One rail Right Number of Stairs: 4  Wheelchair Mobility Wheelchair Mobility: No    Exercises     PT Diagnosis:    PT Problem List:   PT Treatment Interventions:     PT Goals Acute Rehab PT Goals PT Goal Formulation: With patient Time For Goal Achievement: 12/22/11 Potential to Achieve Goals: Good Pt will Ambulate: with modified independence;>150 feet PT Goal: Ambulate - Progress: Met Pt will Go Up / Down Stairs: Flight;with modified independence;with rail(s) PT Goal: Up/Down Stairs - Progress: Met (performed as many as possible with IV )  Visit Information  Last PT Received On: 12/16/11 Assistance Needed: +1    Subjective Data  Subjective: my knee is doing much better today, it is not buckling Patient Stated Goal: return home   Cognition  Overall Cognitive Status: Appears within functional limits for tasks assessed/performed Arousal/Alertness: Awake/alert Orientation Level: Appears intact for tasks assessed Behavior During Session: [facility] for tasks performed    Balance  Balance Balance Assessed: Yes Dynamic Standing Balance Dynamic Standing - Balance Support: No upper extremity supported;During functional activity Dynamic Standing - Level of Assistance: 6: Modified independent (Device/Increase time)  End of Session PT - End of Session Equipment Utilized During Treatment: Gait belt Activity Tolerance: Patient tolerated treatment well Patient left: in bed;with call bell/phone within reach;with family/visitor present Nurse Communication: Mobility status   GP   Lyanne Co, PT  Acute Rehab Services  [phone]   Lyanne Co 12/16/2011, 1:24 PM

## 2011-12-17 LAB — CBC
HCT: 33.7 % — ABNORMAL LOW (ref 36.0–46.0)
MCHC: 32.9 g/dL (ref 30.0–36.0)
MCV: 90.3 fL (ref 78.0–100.0)
RDW: 15.9 % — ABNORMAL HIGH (ref 11.5–15.5)

## 2011-12-17 MED ORDER — RIVAROXABAN 20 MG PO TABS: 20.0000 mg | ORAL_TABLET | Freq: Every day | ORAL | Status: DC

## 2011-12-17 MED ORDER — RIVAROXABAN 15 MG PO TABS: 15.0000 mg | ORAL_TABLET | Freq: Two times a day (BID) | ORAL | Status: DC

## 2011-12-17 NOTE — Discharge Summary (Signed)
Physician Discharge Summary  April [name] NWG:[id number] DOB: [birth year] DOA: 12/14/2011  PCP: Yves Dill, MD  Admit date: 12/14/2011 Discharge date: 12/17/2011  Recommendations for Outpatient Follow-up:  1. Pt will need to follow up with PCP (Dr. Welton Flakes) in 2 weeks post discharge 2. 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 -CT angiogram of the chest negative for pulmonary embolus Sensory disturbance  -Transient Dysesthesias for months  -MRI brain normal -No other focal neurologic deficits  -Question B12 deficiency--patient already on supplementation  Interstitial cystitis  -Clinically stable  -Continue Elmiron  -UA shows no evidence of hematuria  -Continue to monitor for hematuria while initiating anti-coagulation Fibromyalgia/anxiety  -Continue Cymbalta, Vistaril -intermitten ketorolac, K-Pad -stopped naproxen   Discharge Condition: Stable  Disposition:  discharge home  Diet: Regular Wt Readings from Last 3 Encounters:  12/15/11 85.8 kg (189 lb 2.5 oz)  08/23/11 84.823 kg (187 lb)  08/23/11 84.823 kg (187 lb)    History of present illness:  41 y.o. female with past medical history of factor V Leiden mutation heterozygous, family history of DVTs, fibromyalgia, prior history of chronic pain and severe dysmenorrhea status post laparoscopic assisted vaginal hysterectomy 08/21/2011 per Dr. Adalberto Ill history of recurrent UTIs, who presents to the ED after receiving a full call from a urologist office Dr. Logan Bores at the [facility] and told to present to the ED for further evaluation. Patient states was at her PCPs office on the day of admission being seen for anxiety numbness in the face and neck  feet that have been ongoing for a week, nervousness, palpitations, shortness of breath on exertion. Patient stated that the husband received a call from the urologist office and he was told that patient is to present to the ED for further evaluation and information will be sent to the emergency department. Patient does endorses some low-grade fevers, chills, 3 episodes of diarrhea  which has since resolved, generalized weakness, headache, shortness of breath on exertion.  Patient states she also feels unsettled and on edge. Patient complaining of numbness around her lips and feels like her heart is racing. Patient denies any nausea; no vomiting no abdominal pain no constipation no dysuria no cough no chest pain. Patient was seen in the ED and CT scan results from [facility] done on 12/12/2011 showed thrombosis of the right gonadal vein with extension of thrombus into the IVC to the level of the right renal vein. CBC which was done was unremarkable. Basic metabolic profile which was done was unremarkable in ED. Patient was given a dose of full dose Lovenox and oral Coumadin in the ED.   Hospital Course:  Because of the patient's chest discomfort and shortness of breath, CT angiogram of the chest was performed and was negative for pulmonary embolus. CT abdomen and pelvis on 12/14/2011 revealed an acute right gonadal vein thrombus with extension to the inferior vena cava. However, the inferior vena cava lumen was patent. There were no other acute findings noted on the CT. Vascular surgery, Dr. Myra Gianotti, was consulted to see the patient. He did not feel that thrombolysis or percutaneous mechanical thrombectomy was warranted.  Since the patient developed this thrombus without being on anticoagulation, he felt the best treatment  in this situation was to place her on full dose anticoagulation with heparin and Coumadin, or possibly one of the newer oral anticoagulants.  If  patient continues to have hematuria such  that she needs to come off of her anticoagulation, consideration would be for a suprarenal IVC filter as a last resort. Urinalysis did not show any macroscopic or microscopic hematuria. Bilateral lower extremity Dopplers were performed and were negative for deep vein thrombosis. After discussion with the patient, the patient was started on rivaroxaban. Her heparin drip and Coumadin were discontinued. The patient will take rivaroxaban 15 mg twice a day x19 more days, and then 20 mg daily thereafter. The patient had 2 full days of rivaroxaban 15 mg twice a day while in the hospital These instructions were explained to the patient, and she expressed understanding. Serum creatinine was 0.67 at the time of discharge.  The patient continued to have expressed a significant amount of anxiety during her hospitalization. Her home medications including Cymbalta, Flexeril, tramadol and Vistaril were continued. The patient did have difficulty with headaches which did improve with intermittent doses of Toradol and conservative treatment with heating pad to her neck. The patient continued to have concerns of numbness intermittently to her face, gums, tongue, teeth, arms, hands, and legs. MRI of the brain was obtained and was negative for any acute pathology. The patient was instructed to followup with her primary care physician regarding her symptomatology.    Consultants: Vascular surgery  Discharge Exam: Filed Vitals:   12/17/11 0730  BP: 129/79  Pulse: 92  Temp: 97.3 F (36.3 C)  Resp: 18   Filed Vitals:   12/16/11 1812 12/16/11 2149 12/17/11 0514 12/17/11 0730  BP: 141/66 110/80 125/91 129/79  Pulse: 81 73 87 92  Temp: 97.6 F (36.4 C) 98.3 F (36.8 C) 97.7 F (36.5 C) 97.3 F (36.3 C)  TempSrc: Oral Oral Oral Oral  Resp: 18 18 18 18   Height:      Weight:      SpO2: 100% 98% 100% 100%   General: A&O x 3, NAD, pleasant, cooperative Cardiovascular: RRR, no rub, no gallop, no S3 Respiratory:  CTAB, no wheeze, no rhonchi Abdomen:soft, nontender, nondistended, positive bowel sounds Extremities: No edema, No lymphangitis, no petechiae  Discharge Instructions      Discharge Orders    Future Orders Please Complete By Expires   Diet - low sodium heart healthy      Increase activity slowly      Discharge instructions      Comments:   Take Xarelto 15mg  twice a day x 19days. On day #20, start Xarelto 20mg  once a day. Call Urologist and primary care physician immediately if you notice any blood in urine, stool, nose, vomitus, phlegm.       Medication List     As of 12/17/2011 11:07 AM    STOP taking these medications         naproxen 375 MG tablet   Commonly known as: NAPROSYN      TAKE these medications         albuterol 108 (90 BASE) MCG/ACT inhaler   Commonly known as: PROVENTIL HFA;VENTOLIN HFA   Inhale 1 puff into the lungs as needed. For shortness of breath or wheezing      CALCIUM GUMMIES PO   Take 1 tablet by mouth every evening.      Cyanocobalamin 5000 MCG Subl   Place 5,000 mcg under the tongue daily.  DULoxetine 60 MG capsule   Commonly known as: CYMBALTA   Take 60 mg by mouth every 12 (twelve) hours.      Evening Primrose Oil 1000 MG Caps   Take 1,000 mg by mouth every evening.      FLEXERIL 10 MG tablet   Generic drug: cyclobenzaprine   Take 10 mg by mouth 3 (three) times daily. For muscle spasms      fluticasone 50 MCG/ACT nasal spray   Commonly known as: FLONASE   Place 1 spray into the nose daily as needed. For stuffy nose      guaifenesin 400 MG Tabs   Commonly known as: HUMIBID E   Take 400 mg by mouth 2 (two) times daily as needed. For congestion      hydrOXYzine 25 MG capsule   Commonly known as: VISTARIL   Take 50 mg by mouth at bedtime.      multivitamin with minerals Tabs   Take 1 tablet by mouth every evening.      oxybutynin 5 MG tablet   Commonly known as: DITROPAN   Take 5 mg by mouth 3 (three) times daily as  needed. For bladder spasms      pentosan polysulfate 100 MG capsule   Commonly known as: ELMIRON   Take 200 mg by mouth 2 (two) times daily before a meal.      PRESCRIPTION MEDICATION   Place 10 mg vaginally every evening. Diazepam suppository compounded by [facility]      pseudoephedrine 30 MG tablet   Commonly known as: SUDAFED   Take 60 mg by mouth 4 (four) times daily.      Rivaroxaban 15 MG Tabs tablet   Commonly known as: XARELTO   Take 1 tablet (15 mg total) by mouth 2 (two) times daily.      Rivaroxaban 20 MG Tabs   Commonly known as: XARELTO   Take 1 tablet (20 mg total) by mouth daily. Start taking the day after finishing all of the 15mg  tablets.      Simethicone 125 MG Caps   Take 125 mg by mouth 4 (four) times daily.      traMADol 50 MG tablet   Commonly known as: ULTRAM   Take 50 mg by mouth every 6 (six) hours. For pain      valACYclovir 500 MG tablet   Commonly known as: VALTREX   Take 500 mg by mouth at bedtime.           The results of significant diagnostics from this hospitalization (including imaging, microbiology, ancillary and laboratory) are listed below for reference.    Significant Diagnostic Studies: Ct Angio Chest Pe W/cm &/or Wo Cm  12/14/2011  *RADIOLOGY REPORT*  Clinical Data:  Right gonadal vein thrombus with IVC extension, history of factor five Lieden mutation, chest pain, palpitations, abdominal pain  CT ANGIOGRAPHY CHEST WITH CONTRAST  Technique:  Multidetector CT imaging of the chest was performed using the standard protocol during bolus administration of intravenous contrast.  Multiplanar CT image reconstructions including MIPs were obtained to evaluate the vascular anatomy.  Contrast: OMNIPAQUE IOHEXOL 350 MG/ML SOLN  Comparison:  None Available  Findings:  Limited opacification of the pulmonary vasculature.  No significant central or proximal hilar filling defect or pulmonary embolus demonstrated.  The distal segmental  and subsegmental branches are not well visualized to completely exclude small emboli.  Residual thymic tissue in the anterior mediastinum with a triangular configuration.  Thoracic aorta appears  intact.  Negative for aneurysm or dissection.  Normal heart size.  The patient is status post Nissen fundoplication.  No significant recurrent hernia.  Lung windows demonstrate no focal airspace process, collapse, contusion, consolidation or interstitial process.  Central airways and trachea patent.  No suspicious pulmonary nodule or mass.  No acute or abnormal osseous finding.  Review of the MIP images confirms the above findings.  IMPRESSION: No significant large central or proximal hilar pulmonary embolus demonstrated within the limits of the study.  No acute intrathoracic process.  Prior Nissen fundoplication  CT ABDOMEN AND PELVIS WITH CONTRAST  Technique:  Multidetector CT imaging of the abdomen and pelvis was performed using the standard protocol following bolus administration of intravenous contrast.  Contrast: OMNIPAQUE IOHEXOL 350 MG/ML SOLN  Comparison:  None available  Findings:  Prior cholecystectomy noted.  Liver, biliary system, pancreas, spleen, adrenal glands, and kidneys are within normal limits and demonstrate no acute process.  Right gonadal vein demonstrates a hypodense intraluminal thrombus proximally with minimal surrounding strandy edema and enhancement. A small amount of thrombus extends into the IVC lumen without significant luminal narrowing or occlusive process.  Prior Nissen fundoplication changes noted.  No recurrent hernia. Negative for bowel obstruction, dilatation, ileus, or free air. Normal appendix demonstrated.  No abdominal free fluid, fluid collection, hemorrhage, adenopathy, or abscess.  Pelvis:  Prior hysterectomy noted.  Urinary bladder unremarkable. No pelvic free fluid, fluid collection, hemorrhage, abscess, adenopathy, or inguinal abnormality.  No hernia.  No acute distal  bowel process.  No acute or abnormal osseous finding.  IMPRESSION: Acute appearing right gonadal vein thrombosis with a small "finger like" extension into the IVC.  IVC lumen appears patent.  Prior Nissen fundoplication, cholecystectomy, and hysterectomy.  No other acute intra-abdominal process.   Original Report Authenticated By: Judie Petit. Miles Costain, M.D.    Ct Abdomen Pelvis W Contrast  12/14/2011  *RADIOLOGY REPORT*  Clinical Data:  Right gonadal vein thrombus with IVC extension, history of factor five Lieden mutation, chest pain, palpitations, abdominal pain  CT ANGIOGRAPHY CHEST WITH CONTRAST  Technique:  Multidetector CT imaging of the chest was performed using the standard protocol during bolus administration of intravenous contrast.  Multiplanar CT image reconstructions including MIPs were obtained to evaluate the vascular anatomy.  Contrast: OMNIPAQUE IOHEXOL 350 MG/ML SOLN  Comparison:  None Available  Findings:  Limited opacification of the pulmonary vasculature.  No significant central or proximal hilar filling defect or pulmonary embolus demonstrated.  The distal segmental and subsegmental branches are not well visualized to completely exclude small emboli.  Residual thymic tissue in the anterior mediastinum with a triangular configuration.  Thoracic aorta appears intact.  Negative for aneurysm or dissection.  Normal heart size.  The patient is status post Nissen fundoplication.  No significant recurrent hernia.  Lung windows demonstrate no focal airspace process, collapse, contusion, consolidation or interstitial process.  Central airways and trachea patent.  No suspicious pulmonary nodule or mass.  No acute or abnormal osseous finding.  Review of the MIP images confirms the above findings.  IMPRESSION: No significant large central or proximal hilar pulmonary embolus demonstrated within the limits of the study.  No acute intrathoracic process.  Prior Nissen fundoplication  CT ABDOMEN AND PELVIS WITH  CONTRAST  Technique:  Multidetector CT imaging of the abdomen and pelvis was performed using the standard protocol following bolus administration of intravenous contrast.  Contrast: OMNIPAQUE IOHEXOL 350 MG/ML SOLN  Comparison:  None available  Findings:  Prior cholecystectomy  noted.  Liver, biliary system, pancreas, spleen, adrenal glands, and kidneys are within normal limits and demonstrate no acute process.  Right gonadal vein demonstrates a hypodense intraluminal thrombus proximally with minimal surrounding strandy edema and enhancement. A small amount of thrombus extends into the IVC lumen without significant luminal narrowing or occlusive process.  Prior Nissen fundoplication changes noted.  No recurrent hernia. Negative for bowel obstruction, dilatation, ileus, or free air. Normal appendix demonstrated.  No abdominal free fluid, fluid collection, hemorrhage, adenopathy, or abscess.  Pelvis:  Prior hysterectomy noted.  Urinary bladder unremarkable. No pelvic free fluid, fluid collection, hemorrhage, abscess, adenopathy, or inguinal abnormality.  No hernia.  No acute distal bowel process.  No acute or abnormal osseous finding.  IMPRESSION: Acute appearing right gonadal vein thrombosis with a small "finger like" extension into the IVC.  IVC lumen appears patent.  Prior Nissen fundoplication, cholecystectomy, and hysterectomy.  No other acute intra-abdominal process.   Original Report Authenticated By: Judie Petit. Miles Costain, M.D.      Microbiology: Recent Results (from the past 240 hour(s))  MRSA PCR SCREENING     Status: Abnormal   Collection Time   12/14/11  7:31 PM      Component Value Range Status Comment   MRSA by PCR POSITIVE (*) NEGATIVE Final   URINE CULTURE     Status: Normal   Collection Time   12/14/11 10:47 PM      Component Value Range Status Comment   Specimen Description URINE, RANDOM   Final    Special Requests NONE   Final    Culture  Setup Time 12/15/2011 04:58   Final    Colony Count  10,000 COLONIES/ML   Final    Culture     Final    Value: Multiple bacterial morphotypes present, none predominant. Suggest appropriate recollection if clinically indicated.   Report Status 12/16/2011 FINAL   Final      Labs: Basic Metabolic Panel:  Lab 12/15/11 1610 12/14/11 1304  NA 138 137  K 3.6 4.2  CL 102 97  CO2 29 31  GLUCOSE 99 92  BUN 6 9  CREATININE 0.67 0.66  CALCIUM 8.8 9.9  MG 2.1 --  PHOS -- --   Liver Function Tests:  Lab 12/15/11 0314  AST 19  ALT 16  ALKPHOS 32*  BILITOT 0.3  PROT 6.5  ALBUMIN 3.6   No results found for this basename: LIPASE:5,AMYLASE:5 in the last 168 hours No results found for this basename: AMMONIA:5 in the last 168 hours CBC:  Lab 12/17/11 0500 12/16/11 0455 12/15/11 0314 12/14/11 1304  WBC 6.2 5.6 7.8 9.8  NEUTROABS -- -- -- 6.6  HGB 11.1* 11.0* 12.1 13.7  HCT 33.7* 32.3* 35.2* 39.8  MCV 90.3 89.5 87.8 88.2  PLT 257 255 274 342   Cardiac Enzymes: No results found for this basename: CKTOTAL:5,CKMB:5,CKMBINDEX:5,TROPONINI:5 in the last 168 hours BNP: No components found with this basename: POCBNP:5 CBG: No results found for this basename: GLUCAP:5 in the last 168 hours  Time coordinating discharge:  Greater than 30 minutes  Signed:  Rikita Grabert, DO [facility] Pager: [phone] 12/17/2011, 11:07 AM

## 2012-02-08 ENCOUNTER — Encounter: Payer: Self-pay | Admitting: Rheumatology

## 2012-03-02 ENCOUNTER — Encounter: Payer: Self-pay | Admitting: Rheumatology

## 2012-03-06 DIAGNOSIS — N393 Stress incontinence (female) (male): Secondary | ICD-10-CM | POA: Insufficient documentation

## 2012-03-30 ENCOUNTER — Encounter: Payer: Self-pay | Admitting: Rheumatology

## 2012-04-09 ENCOUNTER — Encounter (HOSPITAL_COMMUNITY): Payer: Self-pay | Admitting: Physical Medicine and Rehabilitation

## 2012-04-09 ENCOUNTER — Emergency Department (HOSPITAL_COMMUNITY)
Admission: EM | Admit: 2012-04-09 | Discharge: 2012-04-09 | Disposition: A | Discharge status: Home / Self Care | Payer: Medicaid Other | Attending: Emergency Medicine | Admitting: Emergency Medicine

## 2012-04-09 ENCOUNTER — Emergency Department (HOSPITAL_COMMUNITY): Payer: Medicaid Other

## 2012-04-09 DIAGNOSIS — Z8719 Personal history of other diseases of the digestive system: Secondary | ICD-10-CM | POA: Insufficient documentation

## 2012-04-09 DIAGNOSIS — R0609 Other forms of dyspnea: Secondary | ICD-10-CM | POA: Insufficient documentation

## 2012-04-09 DIAGNOSIS — Z8781 Personal history of (healed) traumatic fracture: Secondary | ICD-10-CM | POA: Insufficient documentation

## 2012-04-09 DIAGNOSIS — Z86718 Personal history of other venous thrombosis and embolism: Secondary | ICD-10-CM | POA: Insufficient documentation

## 2012-04-09 DIAGNOSIS — Z8659 Personal history of other mental and behavioral disorders: Secondary | ICD-10-CM | POA: Insufficient documentation

## 2012-04-09 DIAGNOSIS — Z7901 Long term (current) use of anticoagulants: Secondary | ICD-10-CM | POA: Insufficient documentation

## 2012-04-09 DIAGNOSIS — D6859 Other primary thrombophilia: Secondary | ICD-10-CM | POA: Insufficient documentation

## 2012-04-09 DIAGNOSIS — Z8742 Personal history of other diseases of the female genital tract: Secondary | ICD-10-CM | POA: Insufficient documentation

## 2012-04-09 DIAGNOSIS — R0600 Dyspnea, unspecified: Secondary | ICD-10-CM

## 2012-04-09 DIAGNOSIS — R079 Chest pain, unspecified: Secondary | ICD-10-CM | POA: Insufficient documentation

## 2012-04-09 DIAGNOSIS — R0602 Shortness of breath: Secondary | ICD-10-CM | POA: Insufficient documentation

## 2012-04-09 DIAGNOSIS — J45909 Unspecified asthma, uncomplicated: Secondary | ICD-10-CM | POA: Insufficient documentation

## 2012-04-09 DIAGNOSIS — R0989 Other specified symptoms and signs involving the circulatory and respiratory systems: Secondary | ICD-10-CM | POA: Insufficient documentation

## 2012-04-09 DIAGNOSIS — Z8709 Personal history of other diseases of the respiratory system: Secondary | ICD-10-CM | POA: Insufficient documentation

## 2012-04-09 DIAGNOSIS — Z79899 Other long term (current) drug therapy: Secondary | ICD-10-CM | POA: Insufficient documentation

## 2012-04-09 LAB — BASIC METABOLIC PANEL
BUN: 8 mg/dL (ref 6–23)
CO2: 28 mEq/L (ref 19–32)
Calcium: 9.9 mg/dL (ref 8.4–10.5)
Chloride: 104 mEq/L (ref 96–112)
Creatinine, Ser: 0.85 mg/dL (ref 0.50–1.10)
GFR calc Af Amer: >90 mL/min (ref >90)
GFR calc non Af Amer: 85 mL/min — ABNORMAL LOW (ref >90)
Glucose, Bld: 83 mg/dL (ref 70–99)
Potassium: 4.2 mEq/L (ref 3.5–5.1)
Sodium: 142 mEq/L (ref 135–145)

## 2012-04-09 LAB — CBC WITH DIFFERENTIAL/PLATELET
Basophils Absolute: 0 10*3/uL (ref 0.0–0.1)
Basophils Relative: 0 % (ref 0–1)
Eosinophils Absolute: 0.2 10*3/uL (ref 0.0–0.7)
Eosinophils Relative: 2 % (ref 0–5)
HCT: 36.4 % (ref 36.0–46.0)
Hemoglobin: 12.2 g/dL (ref 12.0–15.0)
Lymphocytes Relative: 39 % (ref 12–46)
Lymphs Abs: 3.3 10*3/uL (ref 0.7–4.0)
MCH: 30.3 pg (ref 26.0–34.0)
MCHC: 33.5 g/dL (ref 30.0–36.0)
MCV: 90.3 fL (ref 78.0–100.0)
Monocytes Absolute: 0.7 10*3/uL (ref 0.1–1.0)
Monocytes Relative: 8 % (ref 3–12)
Neutro Abs: 4.4 10*3/uL (ref 1.7–7.7)
Neutrophils Relative %: 51 % (ref 43–77)
Platelets: 297 10*3/uL (ref 150–400)
RBC: 4.03 MIL/uL (ref 3.87–5.11)
RDW: 14.6 % (ref 11.5–15.5)
WBC: 8.5 10*3/uL (ref 4.0–10.5)

## 2012-04-09 LAB — URINALYSIS, ROUTINE W REFLEX MICROSCOPIC
Bilirubin Urine: NEGATIVE
Glucose, UA: NEGATIVE mg/dL
Hgb urine dipstick: NEGATIVE
Ketones, ur: NEGATIVE mg/dL
Leukocytes, UA: NEGATIVE
Nitrite: NEGATIVE
Protein, ur: NEGATIVE mg/dL
Specific Gravity, Urine: 1.011 (ref 1.005–1.030)
Urobilinogen, UA: 0.2 mg/dL (ref 0.0–1.0)
pH: 6 (ref 5.0–8.0)

## 2012-04-09 LAB — POCT I-STAT TROPONIN I: Troponin i, poc: 0 ng/mL (ref 0.00–0.08)

## 2012-04-09 MED ORDER — IOHEXOL 350 MG/ML SOLN
100.0000 mL | Freq: Once | INTRAVENOUS | Status: AC | PRN
  Administered 2012-04-09: 100 mL via INTRAVENOUS

## 2012-04-09 NOTE — ED Notes (Signed)
Pt presents to department for evaluation of midsternal chest pain and SOB. Onset this morning. Also states pain to R hand radiating to shoulder. 7/10 pain at the time. Also states "I feel like my heart is racing." skin warm and dry. Respirations unlabored. Speaking complete sentences. Pt is alert and oriented x4.

## 2012-04-09 NOTE — ED Notes (Signed)
Pt states that she was having CP while waiting in CT for her scan. EDP notified.

## 2012-04-09 NOTE — ED Notes (Signed)
Pt transported to CT ?

## 2012-04-11 NOTE — ED Provider Notes (Signed)
History    42 year old female with multiple complaints, but primary CT chest pain or shortness of breath. Gradual onset days ago. Waxes and wanes but does not completely go away. Patient with you concern for possible pulmonary embolism. 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 No unusual leg pain or swelling. No fevers or chills.   CSN: [id number]  Arrival date & time 04/09/12  1357   First MD Initiated Contact with Patient 04/09/12 1831      Chief Complaint  Patient presents with  . Chest Pain  . Shortness of Breath    (Consider location/radiation/quality/duration/timing/severity/associated sxs/prior treatment) HPI  Past Medical History  Diagnosis Date  . Fibromyalgia   . Pancreatitis   . Interstitial cystitis   . Pelvic floor dysfunction   . Sinus infection     chronic  . Factor 5 Leiden mutation, heterozygous   . Fracture of 5th metatarsal 03/2011    left foot -    . Ovarian cyst   . Asthma     no inhaler  . 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  [name] Anxiety 12/14/2011    Past Surgical History  Procedure Laterality Date  . Cystoscopy with hydrodistension and biopsy  04/21/2011    Dr Lorenz Coaster  . Laparoscopic nissen fundoplication  2007  . Cholecystectomy    . Tubal ligation  2010  . Wisdom tooth extraction    . Laparoscopic assisted vaginal hysterectomy  08/21/2011    Procedure: LAPAROSCOPIC ASSISTED VAGINAL HYSTERECTOMY;  Surgeon: Jeani Hawking, MD;  Location: [facility];  Service: Gynecology;  Laterality: N/A;  OPEN LAPAROSCOPIC  . Salpingoophorectomy  08/21/2011    Procedure: SALPINGO OOPHERECTOMY;  Surgeon: Jeani Hawking, MD;  Location: [facility];  Service: Gynecology;  Laterality: Bilateral;  . Abdominal hysterectomy      Family History  Problem Relation Age of Onset  . Anesthesia problems Neg Hx     History  Substance Use Topics   . Smoking status: Never Smoker   . Smokeless tobacco: Never Used  . Alcohol Use: No    OB History   Grav Para Term Preterm Abortions TAB SAB Ect Mult Living   4 4 4       4       Review of Systems  All systems reviewed and negative, other than as noted in HPI.   Allergies  Amitriptyline; Bromelains; Celexa; Methylene blue; Sertraline; Sulfonamide derivatives; Bromaline; Other; and Penicillins  Home Medications   Current Outpatient Rx  Name  Route  Sig  Dispense  Refill  . albuterol (PROVENTIL HFA;VENTOLIN HFA) 108 (90 BASE) MCG/ACT inhaler   Inhalation   Inhale 1 puff into the lungs as needed. For shortness of breath or wheezing         . baclofen (LIORESAL) 20 MG tablet   Oral   Take 20 mg by mouth 3 (three) times daily.         . budesonide-formoterol (SYMBICORT) 80-4.5 MCG/ACT inhaler   Inhalation   Inhale 2 puffs into the lungs 2 (two) times daily.         . Calcium-Phosphorus-Vitamin D (CALCIUM GUMMIES PO)   Oral   Take 1 tablet by mouth every evening.         . clobetasol ointment (TEMOVATE) 0.05 %   Topical   Apply 1 application topically daily as needed. For pain/irriation         .  Cyanocobalamin 5000 MCG SUBL   Sublingual   Place 5,000 mcg under the tongue daily.          . Evening Primrose Oil 1000 MG CAPS   Oral   Take 1,000 mg by mouth every evening.          [name] Fexofenadine HCl (ALLEGRA PO)   Oral   Take 1 tablet by mouth daily as needed. For allergies/sinues         . fluconazole (DIFLUCAN) 150 MG tablet   Oral   Take 150 mg by mouth every 7 (seven) days. Take on Mondays         . fluticasone (FLONASE) 50 MCG/ACT nasal spray   Nasal   Place 1 spray into the nose daily as needed. For stuffy nose         . gabapentin (NEURONTIN) 300 MG capsule   Oral   Take 300 mg by mouth 3 (three) times daily.         [name] guaifenesin (HUMIBID E) 400 MG TABS   Oral   Take 400 mg by mouth 2 (two) times daily as needed. For  congestion         . HYDROcodone-acetaminophen (NORCO) 10-325 MG per tablet   Oral   Take 1 tablet by mouth every 6 (six) hours as needed for pain. For pain         . hydrOXYzine (VISTARIL) 25 MG capsule   Oral   Take 50 mg by mouth at bedtime.          . Milnacipran (SAVELLA) 50 MG TABS   Oral   Take 50 mg by mouth 2 (two) times daily.         . Multiple Vitamin (MULITIVITAMIN WITH MINERALS) TABS   Oral   Take 1 tablet by mouth every evening.          [name] oxybutynin (DITROPAN) 5 MG tablet   Oral   Take 5 mg by mouth 3 (three) times daily as needed. For bladder spasms         . pentosan polysulfate (ELMIRON) 100 MG capsule   Oral   Take 200 mg by mouth 2 (two) times daily before a meal.          . PRESCRIPTION MEDICATION   Vaginal   Place 10 mg vaginally every evening. Diazepam suppository compounded by [facility]         . pseudoephedrine (SUDAFED) 30 MG tablet   Oral   Take 60 mg by mouth 4 (four) times daily.         . Rivaroxaban (XARELTO) 15 MG TABS tablet   Oral   Take 1 tablet (15 mg total) by mouth 2 (two) times daily.   38 tablet   0   . Simethicone 125 MG CAPS   Oral   Take 125 mg by mouth 4 (four) times daily.         [name] triamterene-hydrochlorothiazide (DYAZIDE) 37.5-25 MG per capsule   Oral   Take 1 capsule by mouth every morning.         . valACYclovir (VALTREX) 500 MG tablet   Oral   Take 500 mg by mouth at bedtime.            BP 131/81  Pulse 123  Temp(Src) 98.8 F (37.1 C) (Oral)  Resp 24  SpO2 100%  LMP 05/20/2011  Physical Exam  Nursing note and vitals reviewed. Constitutional: She appears well-developed and well-nourished. No  distress.  HENT:  Head: Normocephalic and atraumatic.  Eyes: Conjunctivae are normal. Right eye exhibits no discharge. Left eye exhibits no discharge.  Neck: Neck supple.  Cardiovascular: Normal rate, regular rhythm and normal heart sounds.  Exam reveals no gallop and no friction  rub.   No murmur heard. Pulmonary/Chest: Effort normal and breath sounds normal. No respiratory distress.  Abdominal: Soft. She exhibits no distension. There is no tenderness.  Musculoskeletal: She exhibits no edema and no tenderness.  Lower extremities symmetric as compared to each other. No calf tenderness. Negative Homan's. No palpable cords.   Neurological: She is alert.  Skin: Skin is warm and dry.  Psychiatric: She has a normal mood and affect. Her behavior is normal. Thought content normal.    ED Course  Procedures (including critical care time)  Labs Reviewed  BASIC METABOLIC PANEL - Abnormal; Notable for the following:    GFR calc non Af Amer 85 (*)    All other components within normal limits  CBC WITH DIFFERENTIAL  URINALYSIS, ROUTINE W REFLEX MICROSCOPIC  POCT I-STAT TROPONIN I   Dg Chest 2 View  04/09/2012  *RADIOLOGY REPORT*  Clinical Data: Chest pain, shortness of breath.  CHEST - 2 VIEW  Comparison: CT scan of December 14, 2011.  Findings: Cardiomediastinal silhouette appears normal.  No acute pulmonary disease is noted.  Bony thorax is intact.  IMPRESSION: No acute cardiopulmonary abnormality seen.   Original Report Authenticated By: Lupita Raider.,  M.D.    Ct Angio Chest W/cm &/or Wo Cm  04/09/2012  *RADIOLOGY REPORT*  Clinical Data: Chest pain and shortness of breath  CT ANGIOGRAPHY CHEST  Technique:  Multidetector CT imaging of the chest using the standard protocol during bolus administration of intravenous contrast. Multiplanar reconstructed images including MIPs were obtained and reviewed to evaluate the vascular anatomy.  Contrast: OMNIPAQUE IOHEXOL 350 MG/ML SOLN  Comparison: Chest radiograph same date, chest CT 12/14/2011  Findings: Mild inhomogeneous hypodensity of the right thyroid lobe is noted image seven, unlikely to be clinically significant.  The study is of adequate technical quality for evaluation for pulmonary embolism up to and including the 3rd  order pulmonary arteries. No focal filling defect is seen to suggest acute pulmonary embolism.  Great vessels are normal in caliber.  Heart size is normal.  No pericardial or pleural effusion.  No lymphadenopathy.  Incomplete imaging of the upper abdomen demonstrates cholecystectomy clips and clips at the gastroesophageal junction. The lungs are clear.  No acute osseous finding.  IMPRESSION: No CT evidence for acute pulmonary embolism up to and including the 3rd order pulmonary arteries.  No acute cardiopulmonary process   Original Report Authenticated By: Christiana Pellant, M.D.    EKG:  Rhythm: sinus tach PR interval 104 ms,  QRS duration 80 ms, no delta waves QT/QTc 356/402 ms ST segments: NS ST changes   1. Dyspnea   2. Chest pain       MDM  42 year old female with chest pain and dyspnea. Atypical for ACS. CT negative for pulmonary embolism. Patient is on xarelto which reports compliance with. Very low suspicion for emergent etiology of complaints. After she is safe for discharge at this time. Emergent return precautions were discussed.       Raeford Razor, MD 04/11/12 [phone]

## 2012-06-07 IMAGING — CR DG LUMBAR SPINE COMPLETE 4+V
5 series · 5 of 5 positions shown · non-contrast
Comparison: None

CLINICAL DATA: Back pain.

LUMBAR SPINE - COMPLETE 4+ VIEW

[view not recorded (1 of 5)]
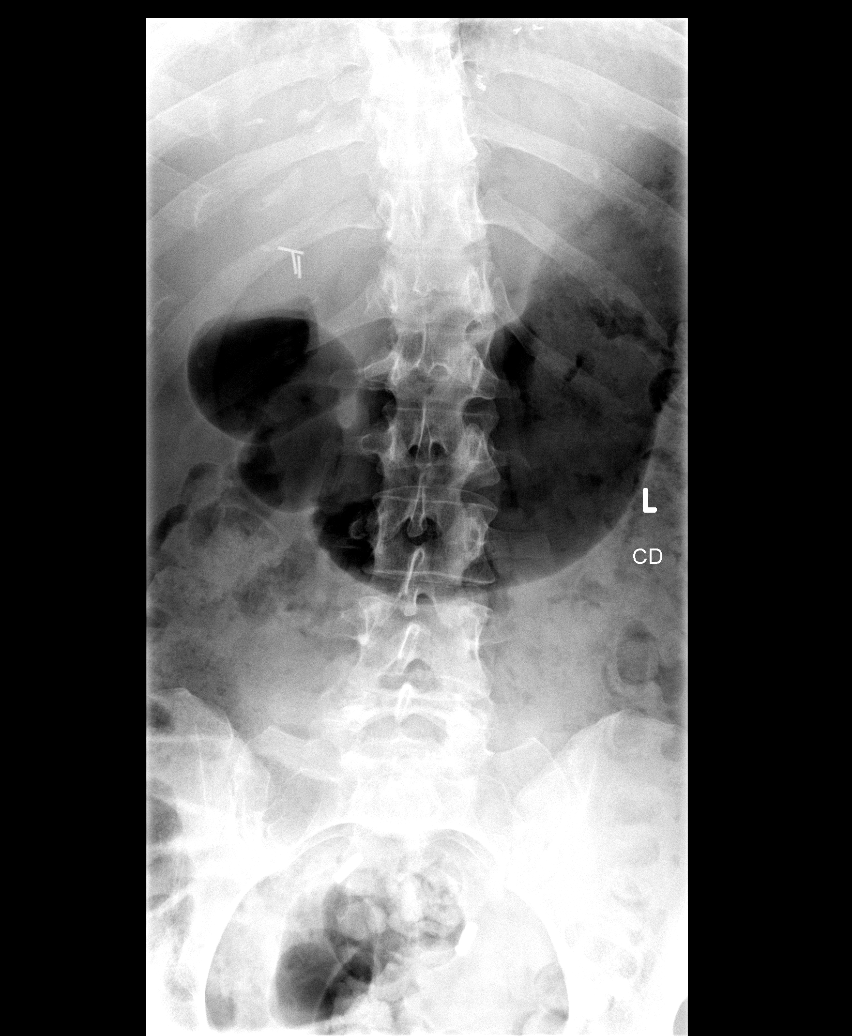

[view not recorded (2 of 5)]
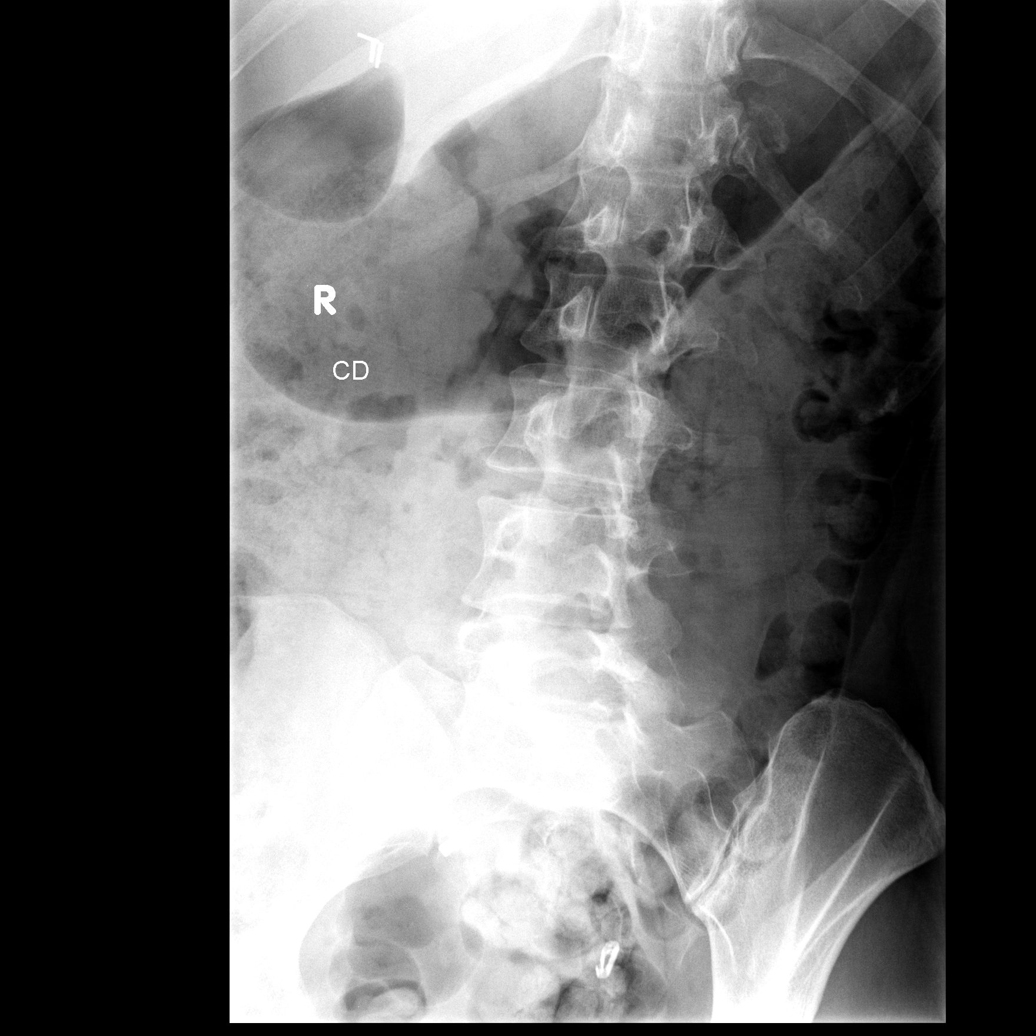

[view not recorded (3 of 5)]
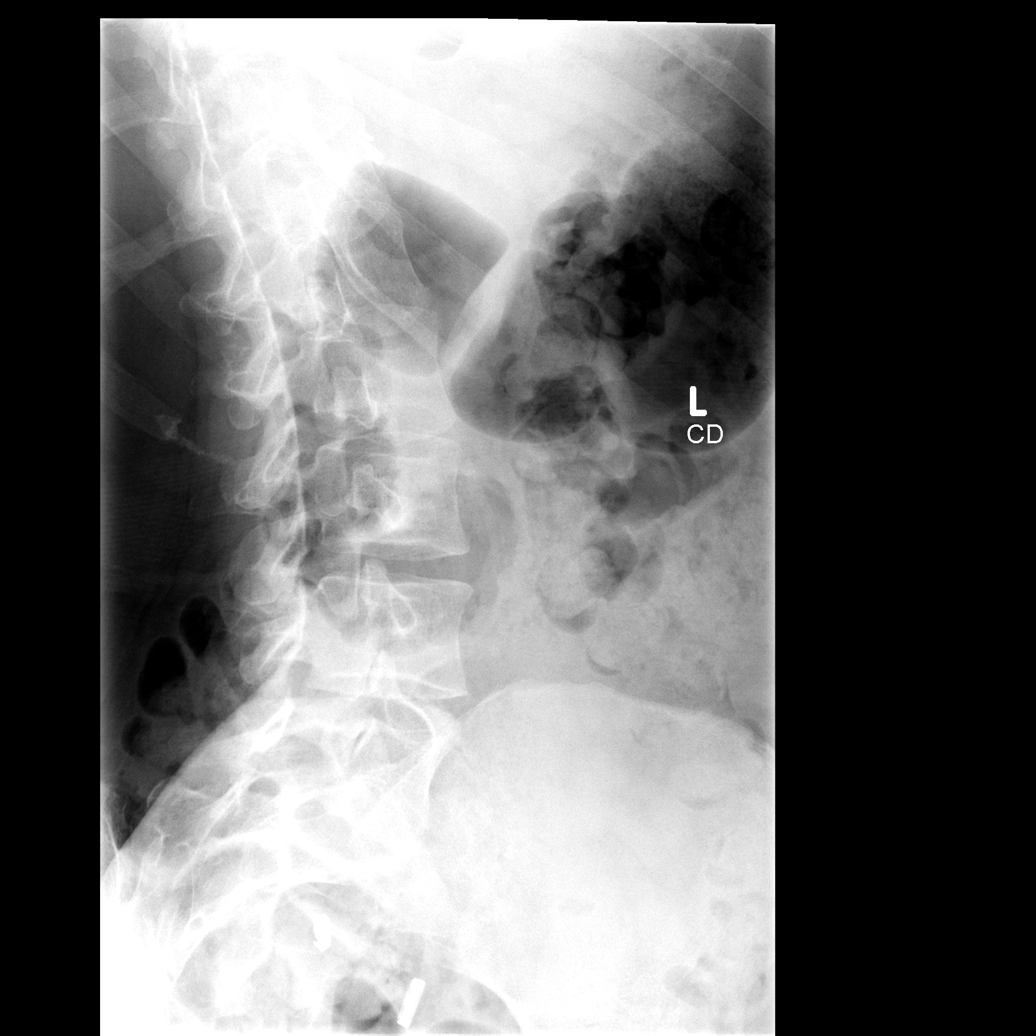

[view not recorded (4 of 5)]
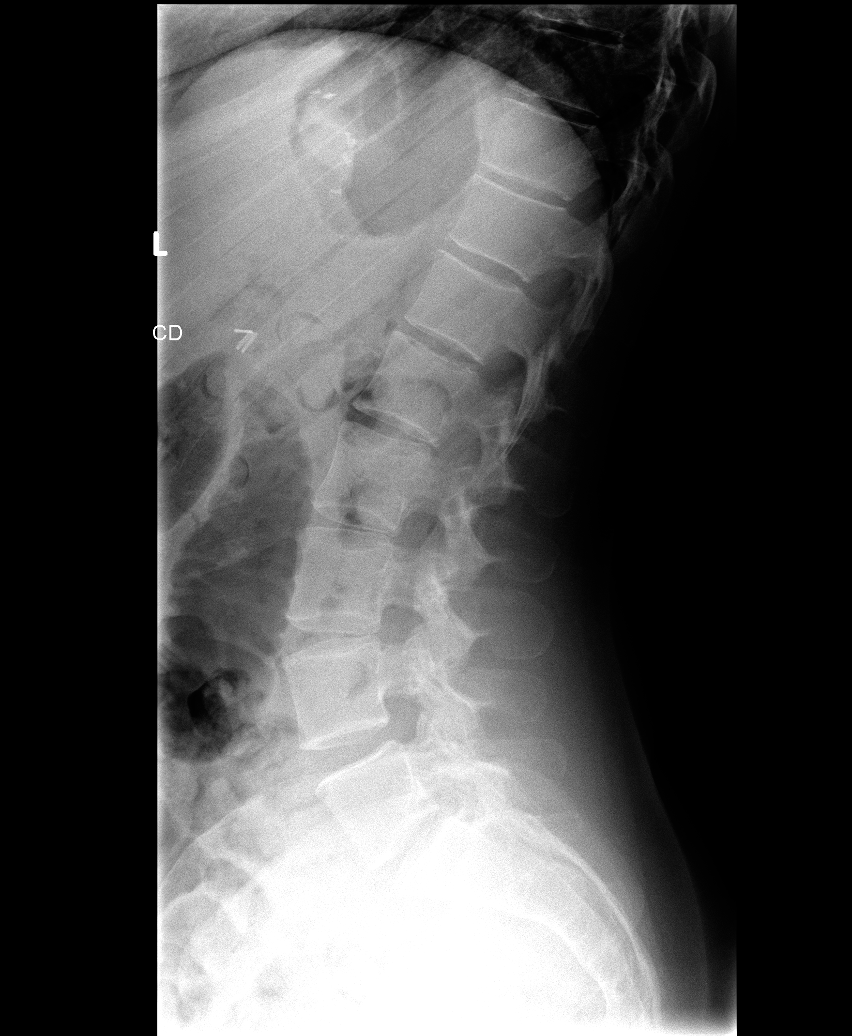

[view not recorded (5 of 5)]
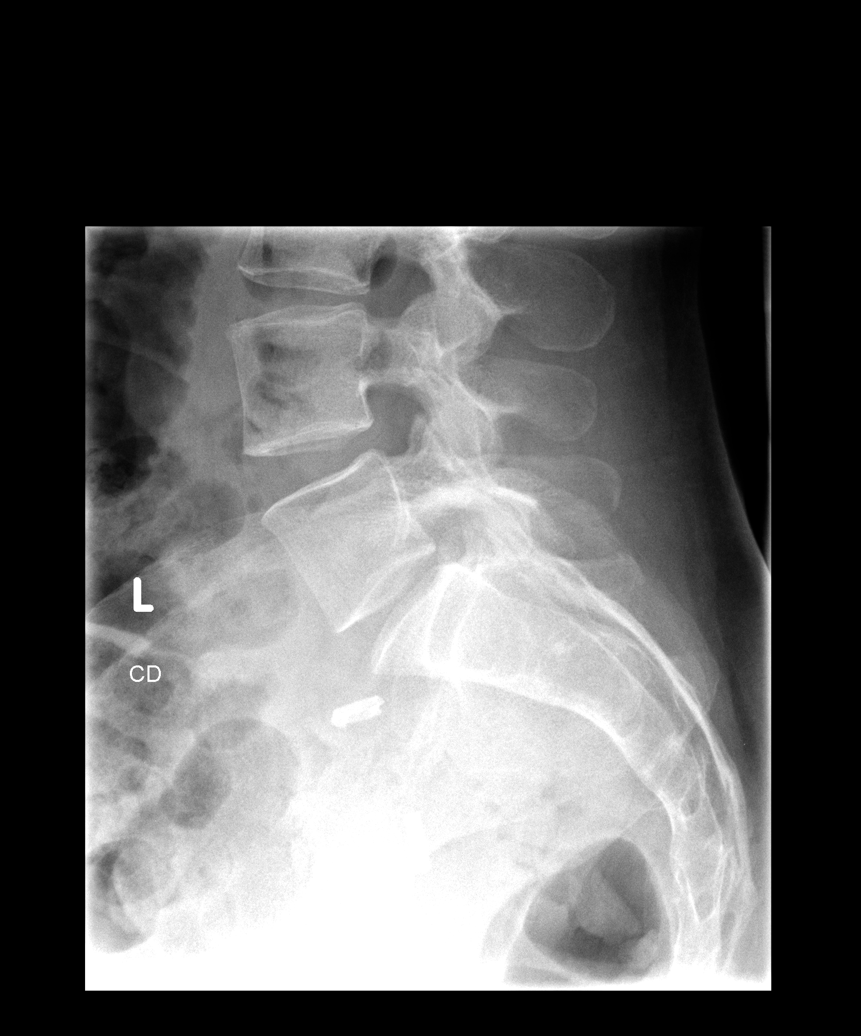

[5 of 5 positions shown; findings below may reference images not displayed]

FINDINGS: There is a mild left convex lumbar scoliosis.  Normal
alignment on the lateral film.  Disc spaces and vertebral bodies
are maintained.  The facets are maintained and no pars defects are
seen.  The visualized bony pelvis is intact.
IMPRESSION: 1.  Mild left convex thoracolumbar scoliosis.
2.  No significant degenerative changes or acute findings.

## 2012-06-17 ENCOUNTER — Other Ambulatory Visit: Payer: Self-pay | Admitting: Obstetrics and Gynecology

## 2012-07-05 ENCOUNTER — Ambulatory Visit (INDEPENDENT_AMBULATORY_CARE_PROVIDER_SITE_OTHER): Payer: Medicaid Other | Admitting: Cardiovascular Disease

## 2012-07-05 ENCOUNTER — Encounter: Payer: Self-pay | Admitting: Cardiovascular Disease

## 2012-07-05 VITALS — BP 110/76 | HR 99 | Ht 63.0 in | Wt 208.0 lb

## 2012-07-05 DIAGNOSIS — D6851 Activated protein C resistance: Secondary | ICD-10-CM

## 2012-07-05 DIAGNOSIS — R0602 Shortness of breath: Secondary | ICD-10-CM

## 2012-07-05 DIAGNOSIS — I82409 Acute embolism and thrombosis of unspecified deep veins of unspecified lower extremity: Secondary | ICD-10-CM

## 2012-07-05 DIAGNOSIS — D6859 Other primary thrombophilia: Secondary | ICD-10-CM

## 2012-07-05 LAB — BASIC METABOLIC PANEL
BUN: 8 mg/dL (ref 6–23)
Chloride: 101 mEq/L (ref 96–112)
GFR: 77.34 mL/min (ref >60.00)
Glucose, Bld: 87 mg/dL (ref 70–99)
Potassium: 3.9 mEq/L (ref 3.5–5.1)

## 2012-07-05 NOTE — Patient Instructions (Addendum)
Your physician recommends that you return for lab work in: today  Your physician recommends that you schedule a follow-up appointment in: as needed

## 2012-07-05 NOTE — Assessment & Plan Note (Signed)
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

## 2012-07-05 NOTE — Progress Notes (Signed)
Patient ID: April [name], female   DOB: 1970/02/21, 42 y.o.   MRN: [id number] 42 yo with presumed factor V leiden and hypercoagulable state.  Seen in hospital 11/13 with gonadal vein and IVC thrombus.  Seen by Dr Myra Gianotti [facility] and started on xarelto.   LE venous duplex exam negative  CT abdomen  Has chronic dyspnea.  Echo done at [facility] showed grade one diastolic dysfunctoin No bad valves and no pulmonary hypertension.  She has a history of anxiety.  She has been on 3 diuretics and now has no LE edema.  No chest pain palpitations syncope  Has asthma but lung doctor has indicated she can take beta blocker. No PND orthopnea.    IMPRESSION: Acute appearing right gonadal vein thrombosis with a small "finger like" extension into the IVC. IVC lumen appears patent.  Prior Nissen fundoplication, cholecystectomy, and hysterectomy.  No other acute intra-abdominal process.  Family history of DVT;s      ROS: Denies fever, malais, weight loss, blurry vision, decreased visual acuity, cough, sputum, SOB, hemoptysis, pleuritic pain, palpitaitons, heartburn, abdominal pain, melena, lower extremity edema, claudication, or rash.  All other systems reviewed and negative   General: Affect appropriate Healthy:  appears stated age HEENT: normal Neck supple with no adenopathy JVP normal no bruits no thyromegaly Lungs clear with no wheezing and good diaphragmatic motion Heart:  S1/S2 no murmur,rub, gallop or click PMI normal Abdomen: benighn, BS positve, no tenderness, no AAA no bruit.  No HSM or HJR Distal pulses intact with no bruits No edema Neuro non-focal Skin warm and dry No muscular weakness  Medications Current Outpatient Prescriptions  Medication Sig Dispense Refill  . albuterol (PROVENTIL HFA;VENTOLIN HFA) 108 (90 BASE) MCG/ACT inhaler Inhale 1 puff into the lungs as needed. For shortness of breath or wheezing      . baclofen (LIORESAL) 20 MG tablet Take 20 mg by mouth 3 (three) times  daily.      . budesonide-formoterol (SYMBICORT) 80-4.5 MCG/ACT inhaler Inhale 2 puffs into the lungs 2 (two) times daily.      . Calcium-Phosphorus-Vitamin D (CALCIUM GUMMIES PO) Take 1 tablet by mouth every evening.      . clobetasol ointment (TEMOVATE) 0.05 % Apply 1 application topically daily as needed. For pain/irriation      . Cyanocobalamin 5000 MCG SUBL Place 5,000 mcg under the tongue daily.       . Evening Primrose Oil 1000 MG CAPS Take 1,000 mg by mouth every evening.       [name] Fexofenadine HCl (ALLEGRA PO) Take 1 tablet by mouth daily as needed. For allergies/sinues      . fluconazole (DIFLUCAN) 150 MG tablet Take 150 mg by mouth every 7 (seven) days. Take on Mondays      . fluticasone (FLONASE) 50 MCG/ACT nasal spray Place 1 spray into the nose daily as needed. For stuffy nose      . gabapentin (NEURONTIN) 300 MG capsule Take 300 mg by mouth 3 (three) times daily.      [name] guaifenesin (HUMIBID E) 400 MG TABS Take 400 mg by mouth 2 (two) times daily as needed. For congestion      . HYDROcodone-acetaminophen (NORCO) 10-325 MG per tablet Take 1 tablet by mouth every 6 (six) hours as needed for pain. For pain      . hydrOXYzine (VISTARIL) 25 MG capsule Take 50 mg by mouth at bedtime.       . Milnacipran (SAVELLA) 50 MG TABS Take 50  mg by mouth 2 (two) times daily.      . Multiple Vitamin (MULITIVITAMIN WITH MINERALS) TABS Take 1 tablet by mouth every evening.       [name] oxybutynin (DITROPAN) 5 MG tablet Take 5 mg by mouth 3 (three) times daily as needed. For bladder spasms      . pentosan polysulfate (ELMIRON) 100 MG capsule Take 200 mg by mouth 2 (two) times daily before a meal.       . PRESCRIPTION MEDICATION Place 10 mg vaginally every evening. Diazepam suppository compounded by [facility]      . pseudoephedrine (SUDAFED) 30 MG tablet Take 60 mg by mouth 4 (four) times daily.      . Rivaroxaban (XARELTO) 15 MG TABS tablet Take 1 tablet (15 mg total) by mouth 2 (two) times daily.  38  tablet  0  . Simethicone 125 MG CAPS Take 125 mg by mouth 4 (four) times daily.      [name] triamterene-hydrochlorothiazide (DYAZIDE) 37.5-25 MG per capsule Take 1 capsule by mouth every morning.      . valACYclovir (VALTREX) 500 MG tablet Take 500 mg by mouth at bedtime.        No current facility-administered medications for this visit.    Allergies Amitriptyline; Bromelains; Celexa; Methylene blue; Sertraline; Sulfonamide derivatives; Bromaline; Other; and Penicillins  Family History: Family History  Problem Relation Age of Onset  . Anesthesia problems Neg Hx     Social History: History   Social History  . Marital Status: Married    Spouse Name: N/A    Number of Children: N/A  . Years of Education: N/A   Occupational History  . Not on file.   Social History Main Topics  . Smoking status: Never Smoker   . Smokeless tobacco: Never Used  . Alcohol Use: No  . Drug Use: No  . Sexually Active: Yes    Birth Control/ Protection: Surgical   Other Topics Concern  . Not on file   Social History Narrative  . No narrative on file    Electrocardiogram:  04/10/12  SR rate 77 normal   Assessment and Plan

## 2012-07-05 NOTE — Assessment & Plan Note (Signed)
Not clear that dypsnea has anything to do with grade one diastolic dysfunction that is frequently commented on echo's and not clinically significant.  Would simplify diuretics and stop HCTZ  Can take extra lasix as needed.  Will check BNP and BMET today

## 2012-07-15 ENCOUNTER — Telehealth: Payer: Self-pay | Admitting: Cardiovascular Disease

## 2012-07-15 DIAGNOSIS — R0602 Shortness of breath: Secondary | ICD-10-CM

## 2012-07-15 NOTE — Telephone Encounter (Signed)
Pt advised.

## 2012-07-15 NOTE — Telephone Encounter (Signed)
New Problem:    Patient called in because she was told to stop taking her triamterene-hydrochlorothiazide (DYAZIDE) 37.5-25 MG per capsule and after several days she began swelling and retaining weight.  Patient began taking the medication again and would like to know how she should proceed.  Please call back.

## 2012-07-15 NOTE — Telephone Encounter (Signed)
Spoke with patient. Pt states she was told to stop HCTZ at time of office visit with Dr Eden Emms because she was also on lasix. Pt states last Wednesday she had swelling in her feet and ankles. Weight was up 3 pounds. Pt took an HCTZ Friday, Saturday, Sunday, and today. She is back to normal weight, feet and ankles are normal, no SOB. Pt is asking if she should continue HCTZ or do something different. I will forward to Dr Eden Emms for review.

## 2012-07-15 NOTE — Telephone Encounter (Signed)
Can take the HCTZ M/W/F just 3x/week and repeat BMET in 10 weeks

## 2012-07-16 ENCOUNTER — Telehealth: Payer: Self-pay | Admitting: Cardiovascular Disease

## 2012-07-16 NOTE — Telephone Encounter (Signed)
PT  HAD SEVERAL QUESTIONS   WAS ABLE TO ANSWER  APPROPRIATELY PT TO CALL BACK IF HAS ANY OTHER CONCERNS .Zack Seal

## 2012-07-16 NOTE — Telephone Encounter (Signed)
New Problem  Pt has a few more question about what was discussed yesterday with Thurston Hole. She would like to speak with someone again today. See previous TE.

## 2012-07-25 ENCOUNTER — Other Ambulatory Visit: Payer: Medicaid Other

## 2012-08-14 NOTE — Telephone Encounter (Signed)
Spoke with pt, she is wondering if she needs to come back to see dr Eden Emms. According to the pt dr Eden Emms did not have her records when he saw her, esp her echo. Her med list from her visit with dr Eden Emms was old. According to the office note the pt is coming back as needed. ? Do we need to see her once we get her records and do we want to refill her furosemide. If we are not going to follow her we may need to call her PCP about the furosemide. Pt aware dr Eden Emms not back until Friday will clarify with dr Eden Emms and call the pt back. Pt agreed with this plan.

## 2012-08-14 NOTE — Telephone Encounter (Signed)
New prob  Pt states that she was advised by her PCP that Dr Eden Emms should be the one to prescribe her Lasix from now on. She would like to speak with a nurse when possible.

## 2012-08-16 ENCOUNTER — Telehealth: Payer: Self-pay | Admitting: *Deleted

## 2012-08-16 NOTE — Telephone Encounter (Signed)
Echo from [place] was normal except grade one diastolic dysfunction not significant Her edema is from hypercoagulability and gonadal vein thrombosis  Primary should Prescribe diuretics She does not need to follow with [place]

## 2012-08-16 NOTE — Telephone Encounter (Signed)
PT  AWARE./CY 

## 2012-08-19 NOTE — Telephone Encounter (Signed)
ERROR

## 2012-09-23 ENCOUNTER — Ambulatory Visit: Payer: Self-pay | Admitting: Internal Medicine

## 2012-09-23 ENCOUNTER — Other Ambulatory Visit (INDEPENDENT_AMBULATORY_CARE_PROVIDER_SITE_OTHER): Payer: Medicaid Other

## 2012-09-23 DIAGNOSIS — R0602 Shortness of breath: Secondary | ICD-10-CM

## 2012-09-23 LAB — BASIC METABOLIC PANEL
BUN: 7 mg/dL (ref 6–23)
CO2: 29 mEq/L (ref 19–32)
Chloride: 95 mEq/L — ABNORMAL LOW (ref 96–112)
Creatinine, Ser: 1 mg/dL (ref 0.4–1.2)

## 2012-10-09 DIAGNOSIS — N9489 Other specified conditions associated with female genital organs and menstrual cycle: Secondary | ICD-10-CM | POA: Insufficient documentation

## 2012-10-09 DIAGNOSIS — M6289 Other specified disorders of muscle: Secondary | ICD-10-CM | POA: Insufficient documentation

## 2012-12-10 DIAGNOSIS — J329 Chronic sinusitis, unspecified: Secondary | ICD-10-CM | POA: Insufficient documentation

## 2012-12-10 DIAGNOSIS — J342 Deviated nasal septum: Secondary | ICD-10-CM | POA: Insufficient documentation

## 2013-01-21 DIAGNOSIS — J309 Allergic rhinitis, unspecified: Secondary | ICD-10-CM | POA: Insufficient documentation

## 2013-01-21 DIAGNOSIS — M26609 Unspecified temporomandibular joint disorder, unspecified side: Secondary | ICD-10-CM | POA: Insufficient documentation

## 2013-04-13 DIAGNOSIS — N3289 Other specified disorders of bladder: Secondary | ICD-10-CM | POA: Insufficient documentation

## 2013-07-15 ENCOUNTER — Emergency Department: Payer: Self-pay | Admitting: Emergency Medicine

## 2013-07-15 ENCOUNTER — Ambulatory Visit: Payer: Self-pay | Admitting: Internal Medicine

## 2013-07-15 LAB — URINALYSIS, COMPLETE
BILIRUBIN, UR: NEGATIVE
BLOOD: NEGATIVE
Bacteria: NONE SEEN
GLUCOSE, UR: NEGATIVE mg/dL (ref 0–75)
Ketone: NEGATIVE
Leukocyte Esterase: NEGATIVE
NITRITE: NEGATIVE
Ph: 6 (ref 4.5–8.0)
Protein: NEGATIVE
SPECIFIC GRAVITY: 1.003 (ref 1.003–1.030)
WBC UR: NONE SEEN /HPF (ref 0–5)

## 2013-07-15 LAB — CBC
HCT: 37.8 % (ref 35.0–47.0)
HGB: 12.6 g/dL (ref 12.0–16.0)
MCH: 32.1 pg (ref 26.0–34.0)
MCHC: 33.3 g/dL (ref 32.0–36.0)
MCV: 96 fL (ref 80–100)
Platelet: 244 10*3/uL (ref 150–440)
RBC: 3.93 10*6/uL (ref 3.80–5.20)
RDW: 13.8 % (ref 11.5–14.5)
WBC: 7.9 10*3/uL (ref 3.6–11.0)

## 2013-07-15 LAB — BASIC METABOLIC PANEL
ANION GAP: 9 (ref 7–16)
BUN: 7 mg/dL (ref 7–18)
CO2: 26 mmol/L (ref 21–32)
CREATININE: 0.96 mg/dL (ref 0.60–1.30)
Calcium, Total: 8.4 mg/dL — ABNORMAL LOW (ref 8.5–10.1)
Chloride: 104 mmol/L (ref 98–107)
EGFR (African American): >60
EGFR (Non-African Amer.): >60
GLUCOSE: 102 mg/dL — AB (ref 65–99)
OSMOLALITY: 276 (ref 275–301)
Potassium: 3.3 mmol/L — ABNORMAL LOW (ref 3.5–5.1)
Sodium: 139 mmol/L (ref 136–145)

## 2013-07-15 LAB — TROPONIN I

## 2013-07-16 LAB — D-DIMER(ARMC): D-Dimer: 421 ng/ml

## 2013-08-27 DIAGNOSIS — R519 Headache, unspecified: Secondary | ICD-10-CM | POA: Insufficient documentation

## 2013-08-27 DIAGNOSIS — R200 Anesthesia of skin: Secondary | ICD-10-CM | POA: Insufficient documentation

## 2013-08-27 DIAGNOSIS — R51 Headache: Secondary | ICD-10-CM

## 2013-11-17 ENCOUNTER — Ambulatory Visit: Payer: Self-pay | Admitting: Neurology

## 2013-11-19 DIAGNOSIS — Z79899 Other long term (current) drug therapy: Secondary | ICD-10-CM | POA: Insufficient documentation

## 2013-11-26 ENCOUNTER — Other Ambulatory Visit: Payer: Self-pay | Admitting: Obstetrics and Gynecology

## 2013-11-27 LAB — CYTOLOGY - PAP

## 2013-12-01 ENCOUNTER — Encounter: Payer: Self-pay | Admitting: Cardiovascular Disease

## 2014-04-23 DIAGNOSIS — J45909 Unspecified asthma, uncomplicated: Secondary | ICD-10-CM | POA: Insufficient documentation

## 2014-05-26 DIAGNOSIS — N816 Rectocele: Secondary | ICD-10-CM | POA: Insufficient documentation

## 2014-07-07 ENCOUNTER — Other Ambulatory Visit: Payer: Self-pay | Admitting: Nurse Practitioner

## 2014-07-07 DIAGNOSIS — N6452 Nipple discharge: Secondary | ICD-10-CM

## 2014-07-10 ENCOUNTER — Other Ambulatory Visit: Payer: Medicaid Other

## 2014-07-13 ENCOUNTER — Ambulatory Visit
Admission: RE | Admit: 2014-07-13 | Discharge: 2014-07-13 | Disposition: A | Discharge status: Home / Self Care | Payer: Managed Care, Other (non HMO) | Source: Ambulatory Visit | Attending: Nurse Practitioner | Admitting: Nurse Practitioner

## 2014-07-13 DIAGNOSIS — N6452 Nipple discharge: Secondary | ICD-10-CM

## 2014-08-25 DIAGNOSIS — N6019 Diffuse cystic mastopathy of unspecified breast: Secondary | ICD-10-CM | POA: Insufficient documentation

## 2014-08-28 DIAGNOSIS — N6009 Solitary cyst of unspecified breast: Secondary | ICD-10-CM | POA: Insufficient documentation

## 2014-09-22 DIAGNOSIS — Z79899 Other long term (current) drug therapy: Secondary | ICD-10-CM | POA: Insufficient documentation

## 2014-10-23 ENCOUNTER — Encounter: Payer: Self-pay | Admitting: Obstetrics and Gynecology

## 2014-11-05 DIAGNOSIS — D649 Anemia, unspecified: Secondary | ICD-10-CM | POA: Insufficient documentation

## 2014-12-10 ENCOUNTER — Encounter: Payer: Managed Care, Other (non HMO) | Admitting: Hematology & Oncology

## 2014-12-10 ENCOUNTER — Other Ambulatory Visit: Payer: Managed Care, Other (non HMO)

## 2014-12-10 ENCOUNTER — Ambulatory Visit: Payer: Managed Care, Other (non HMO)

## 2014-12-23 NOTE — Progress Notes (Signed)
This encounter was created in error - please disregard.

## 2015-01-02 DIAGNOSIS — N83202 Unspecified ovarian cyst, left side: Secondary | ICD-10-CM | POA: Insufficient documentation

## 2015-05-20 ENCOUNTER — Emergency Department: Payer: Medicare Other

## 2015-05-20 ENCOUNTER — Emergency Department
Admission: EM | Admit: 2015-05-20 | Discharge: 2015-05-20 | Disposition: A | Discharge status: Home / Self Care | Payer: Medicare Other | Attending: Emergency Medicine | Admitting: Emergency Medicine

## 2015-05-20 DIAGNOSIS — J45909 Unspecified asthma, uncomplicated: Secondary | ICD-10-CM | POA: Insufficient documentation

## 2015-05-20 DIAGNOSIS — Z7901 Long term (current) use of anticoagulants: Secondary | ICD-10-CM | POA: Diagnosis not present

## 2015-05-20 DIAGNOSIS — J069 Acute upper respiratory infection, unspecified: Secondary | ICD-10-CM | POA: Insufficient documentation

## 2015-05-20 DIAGNOSIS — I82409 Acute embolism and thrombosis of unspecified deep veins of unspecified lower extremity: Secondary | ICD-10-CM | POA: Diagnosis not present

## 2015-05-20 DIAGNOSIS — Z79899 Other long term (current) drug therapy: Secondary | ICD-10-CM | POA: Diagnosis not present

## 2015-05-20 DIAGNOSIS — J40 Bronchitis, not specified as acute or chronic: Secondary | ICD-10-CM

## 2015-05-20 DIAGNOSIS — J209 Acute bronchitis, unspecified: Secondary | ICD-10-CM | POA: Diagnosis not present

## 2015-05-20 DIAGNOSIS — R05 Cough: Secondary | ICD-10-CM | POA: Diagnosis present

## 2015-05-20 MED ORDER — IPRATROPIUM-ALBUTEROL 0.5-2.5 (3) MG/3ML IN SOLN
3.0000 mL | Freq: Once | RESPIRATORY_TRACT | Status: AC
  Administered 2015-05-20: 3 mL via RESPIRATORY_TRACT
  Filled 2015-05-20: qty 3

## 2015-05-20 MED ORDER — GUAIFENESIN-CODEINE 100-10 MG/5ML PO SOLN: 10.0000 mL | ORAL | Status: DC | PRN

## 2015-05-20 MED ORDER — AZITHROMYCIN 250 MG PO TABS: ORAL_TABLET | ORAL | Status: DC

## 2015-05-20 NOTE — ED Notes (Signed)
Pt c/o cough with sinus congestion since feb, states she takes allegra daily

## 2015-05-20 NOTE — Discharge Instructions (Signed)
Upper Respiratory Infection, Adult Most upper respiratory infections (URIs) are a viral infection of the air passages leading to the lungs. A URI affects the nose, throat, and upper air passages. The most common type of URI is nasopharyngitis and is typically referred to as "the common cold." URIs run their course and usually go away on their own. Most of the time, a URI does not require medical attention, but sometimes a bacterial infection in the upper airways can follow a viral infection. This is called a secondary infection. Sinus and middle ear infections are common types of secondary upper respiratory infections. Bacterial pneumonia can also complicate a URI. A URI can worsen asthma and chronic obstructive pulmonary disease (COPD). Sometimes, these complications can require emergency medical care and may be life threatening.  CAUSES Almost all URIs are caused by viruses. A virus is a type of germ and can spread from one person to another.  RISKS FACTORS You may be at risk for a URI if:   You smoke.   You have chronic heart or lung disease.  You have a weakened defense (immune) system.   You are very young or very old.   You have nasal allergies or asthma.  You work in crowded or poorly ventilated areas.  You work in health care facilities or schools. SIGNS AND SYMPTOMS  Symptoms typically develop 2-3 days after you come in contact with a cold virus. Most viral URIs last 7-10 days. However, viral URIs from the influenza virus (flu virus) can last 14-18 days and are typically more severe. Symptoms may include:   Runny or stuffy (congested) nose.   Sneezing.   Cough.   Sore throat.   Headache.   Fatigue.   Fever.   Loss of appetite.   Pain in your forehead, behind your eyes, and over your cheekbones (sinus pain).  Muscle aches.  DIAGNOSIS  Your health care provider may diagnose a URI by:  Physical exam.  Tests to check that your symptoms are not due to  another condition such as:  Strep throat.  Sinusitis.  Pneumonia.  Asthma. TREATMENT  A URI goes away on its own with time. It cannot be cured with medicines, but medicines may be prescribed or recommended to relieve symptoms. Medicines may help:  Reduce your fever.  Reduce your cough.  Relieve nasal congestion. HOME CARE INSTRUCTIONS   Take medicines only as directed by your health care provider.   Gargle warm saltwater or take cough drops to comfort your throat as directed by your health care provider.  Use a warm mist humidifier or inhale steam from a shower to increase air moisture. This may make it easier to breathe.  Drink enough fluid to keep your urine clear or pale yellow.   Eat soups and other clear broths and maintain good nutrition.   Rest as needed.   Return to work when your temperature has returned to normal or as your health care provider advises. You may need to stay home longer to avoid infecting others. You can also use a face mask and careful hand washing to prevent spread of the virus.  Increase the usage of your inhaler if you have asthma.   Do not use any tobacco products, including cigarettes, chewing tobacco, or electronic cigarettes. If you need help quitting, ask your health care provider. PREVENTION  The best way to protect yourself from getting a cold is to practice good hygiene.   Avoid oral or hand contact with people with cold   symptoms.   Wash your hands often if contact occurs.  There is no clear evidence that vitamin C, vitamin E, echinacea, or exercise reduces the chance of developing a cold. However, it is always recommended to get plenty of rest, exercise, and practice good nutrition.  SEEK MEDICAL CARE IF:   You are getting worse rather than better.   Your symptoms are not controlled by medicine.   You have chills.  You have worsening shortness of breath.  You have brown or red mucus.  You have yellow or brown nasal  discharge.  You have pain in your face, especially when you bend forward.  You have a fever.  You have swollen neck glands.  You have pain while swallowing.  You have white areas in the back of your throat. SEEK IMMEDIATE MEDICAL CARE IF:   You have severe or persistent:  Headache.  Ear pain.  Sinus pain.  Chest pain.  You have chronic lung disease and any of the following:  Wheezing.  Prolonged cough.  Coughing up blood.  A change in your usual mucus.  You have a stiff neck.  You have changes in your:  Vision.  Hearing.  Thinking.  Mood. MAKE SURE YOU:   Understand these instructions.  Will watch your condition.  Will get help right away if you are not doing well or get worse.   This information is not intended to replace advice given to you by your health care provider. Make sure you discuss any questions you have with your health care provider.   Document Released: 07/12/2000 Document Revised: 06/02/2014 Document Reviewed: 04/23/2013 Elsevier Interactive Patient Education 2016 [employer]  

## 2015-05-20 NOTE — ED Notes (Signed)
Was dx with bronchitis - but wants cxr.

## 2015-05-20 NOTE — ED Provider Notes (Signed)
[facility] Emergency Department Provider Note  ____________________________________________  Time seen: Approximately 3:17 PM  I have reviewed the triage vital signs and the nursing notes.   HISTORY  Chief Complaint Cough and Nasal Congestion    HPI April [name] is a 45 y.o. female presents for evaluation for continuous coughing with productive yellow sputum 2 months. Patient reports that she was treated with a Z-Pak and February and Levaquin in March. Had a Medrol Dosepak earlier this month. Sent to the ER for chest x-ray by her PCP. Patient reports past medical history significant for asthma.   Past Medical History  Diagnosis Date  . Fibromyalgia   . Pancreatitis   . Interstitial cystitis   . Pelvic floor dysfunction   . Sinus infection     chronic  . Factor 5 Leiden mutation, heterozygous ([place])   . Fracture of 5th metatarsal 03/2011    left foot -    . Ovarian cyst   . Asthma     no inhaler  . 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  [name] Anxiety 12/14/2011    Patient Active Problem List   Diagnosis Date Noted  . Sensory disturbance 12/16/2011  . Interstitial cystitis 12/15/2011  . Hematuria 12/15/2011  . Acute DVT (deep venous thrombosis)-right gonadal 12/14/2011  . SOB (shortness of breath) on exertion 12/14/2011  . Factor 5 Leiden mutation, heterozygous ([place]) 12/14/2011  . Anxiety 12/14/2011  . Fibromyalgia 12/14/2011  . ABDOMINAL PAIN OTHER SPECIFIED SITE 03/14/2010    Past Surgical History  Procedure Laterality Date  . Cystoscopy with hydrodistension and biopsy  04/21/2011    Dr Philis Fendt  . Laparoscopic nissen fundoplication  [id number]  . Cholecystectomy    . Tubal ligation  2010  . Wisdom tooth extraction    . Laparoscopic assisted vaginal hysterectomy  08/21/2011    Procedure: LAPAROSCOPIC ASSISTED VAGINAL HYSTERECTOMY;   Surgeon: Cyril Mourning, MD;  Location: [facility];  Service: Gynecology;  Laterality: N/A;  OPEN LAPAROSCOPIC  . Salpingoophorectomy  08/21/2011    Procedure: SALPINGO OOPHERECTOMY;  Surgeon: Cyril Mourning, MD;  Location: [facility];  Service: Gynecology;  Laterality: Bilateral;  . Abdominal hysterectomy      Current Outpatient Rx  Name  Route  Sig  Dispense  Refill  . albuterol (PROVENTIL HFA;VENTOLIN HFA) 108 (90 BASE) MCG/ACT inhaler   Inhalation   Inhale 1 puff into the lungs as needed. For shortness of breath or wheezing         . azithromycin (ZITHROMAX Z-PAK) 250 MG tablet      Take 2 tablets (500 mg) on  Day 1,  followed by 1 tablet (250 mg) once daily on Days 2 through 5.   6 each   0   . budesonide-formoterol (SYMBICORT) 80-4.5 MCG/ACT inhaler   Inhalation   Inhale 2 puffs into the lungs 2 (two) times daily.         . Calcium-Phosphorus-Vitamin D (CALCIUM GUMMIES PO)   Oral   Take 1 tablet by mouth every evening.         . clobetasol ointment (TEMOVATE) 0.05 %   Topical   Apply 1 application topically daily as needed. For pain/irriation         . Cyanocobalamin 5000 MCG SUBL   Sublingual   Place 5,000 mcg under the tongue daily.          [name]  Evening Primrose Oil 1000 MG CAPS   Oral   Take 1,000 mg by mouth every evening.          [name] Fexofenadine HCl (ALLEGRA PO)   Oral   Take 1 tablet by mouth daily as needed. For allergies/sinues         . fluconazole (DIFLUCAN) 150 MG tablet   Oral   Take 150 mg by mouth every 7 (seven) days. Take on Mondays         . fluticasone (FLONASE) 50 MCG/ACT nasal spray   Nasal   Place 1 spray into the nose daily as needed. For stuffy nose         . gabapentin (NEURONTIN) 300 MG capsule   Oral   Take 300 mg by mouth 3 (three) times daily.         [name] guaifenesin (HUMIBID E) 400 MG TABS   Oral   Take 400 mg by mouth 2 (two) times daily as needed. For congestion         . guaiFENesin-codeine 100-10 MG/5ML  syrup   Oral   Take 10 mLs by mouth every 4 (four) hours as needed for cough.   180 mL   0   . hydrochlorothiazide (HYDRODIURIL) 25 MG tablet      1 tablet on M-W-F (3 X per week)         . HYDROcodone-acetaminophen (NORCO) 10-325 MG per tablet   Oral   Take 1 tablet by mouth every 6 (six) hours as needed for pain. For pain         . hydrOXYzine (VISTARIL) 25 MG capsule   Oral   Take 50 mg by mouth at bedtime.          . Milnacipran (SAVELLA) 50 MG TABS   Oral   Take 50 mg by mouth 2 (two) times daily.         . montelukast (SINGULAIR) 10 MG tablet   Oral   Take 10 mg by mouth at bedtime.         . Multiple Vitamin (MULITIVITAMIN WITH MINERALS) TABS   Oral   Take 1 tablet by mouth every evening.          [name] oxybutynin (DITROPAN) 5 MG tablet   Oral   Take 5 mg by mouth 3 (three) times daily as needed. For bladder spasms         . pentosan polysulfate (ELMIRON) 100 MG capsule   Oral   Take 200 mg by mouth 2 (two) times daily before a meal.          . PRESCRIPTION MEDICATION   Vaginal   Place 10 mg vaginally every evening. Diazepam suppository compounded by [facility]         . pseudoephedrine (SUDAFED) 30 MG tablet   Oral   Take 60 mg by mouth 4 (four) times daily.         . Rivaroxaban (XARELTO) 15 MG TABS tablet   Oral   Take 1 tablet (15 mg total) by mouth 2 (two) times daily.   38 tablet   0   . Simethicone 125 MG CAPS   Oral   Take 125 mg by mouth 4 (four) times daily.         [name] spironolactone (ALDACTONE) 50 MG tablet      50 mg. Take 50 mg by mouth daily.         . traMADol (ULTRAM) 50 MG tablet  Take by mouth. Take 50 mg by mouth every 6 (six) hours. For pain         . triamterene-hydrochlorothiazide (DYAZIDE) 37.5-25 MG per capsule   Oral   Take 1 capsule by mouth every morning.         . valACYclovir (VALTREX) 500 MG tablet   Oral   Take 500 mg by mouth at bedtime.             Allergies Amitriptyline; Bromelains; Citalopram; Celexa; Methylene blue; Sertraline; Sulfonamide derivatives; Bromaline; Other; and Penicillins  Family History  Problem Relation Age of Onset  . Anesthesia problems Neg Hx     Social History Social History  Substance Use Topics  . Smoking status: Never Smoker   . Smokeless tobacco: Never Used  . Alcohol Use: No    Review of Systems Constitutional: No fever/chills Eyes: No visual changes. ENT: No sore throat. Cardiovascular: Denies chest pain. Respiratory: Denies shortness of breath.Positive for wheezing and coughing. Gastrointestinal: No abdominal pain.  No nausea, no vomiting.  No diarrhea.  No constipation. Genitourinary: Negative for dysuria. Musculoskeletal: Negative for back pain. Skin: Negative for rash. Neurological: Negative for headaches, focal weakness or numbness.  10-point ROS otherwise negative.  ____________________________________________   PHYSICAL EXAM:  VITAL SIGNS: ED Triage Vitals  Enc Vitals Group     BP 05/20/15 1446 129/97 mmHg     Pulse Rate 05/20/15 1446 89     Resp 05/20/15 1446 17     Temp 05/20/15 1446 98.6 F (37 C)     Temp Source 05/20/15 1446 Oral     SpO2 05/20/15 1446 98 %     Weight 05/20/15 1446 174 lb (78.926 kg)     Height 05/20/15 1446 5\' 5"  (1.651 m)     Head Cir --      Peak Flow --      Pain Score 05/20/15 1448 7     Pain Loc --      Pain Edu? --      Excl. in [place]? --     Constitutional: Alert and oriented. Well appearing and in no acute distress. Eyes: Conjunctivae are normal. PERRL. EOMI. Head: Atraumatic. Nose: No congestion/rhinnorhea. Mouth/Throat: Mucous membranes are moist.  Oropharynx non-erythematous. Neck: No stridor.   Cardiovascular: Normal rate, regular rhythm. Grossly normal heart sounds.  Good peripheral circulation. Respiratory: Normal respiratory effort.  No retractions. Lungs With expiratory wheezing noted bilaterally. Gastrointestinal: Soft  and nontender. No distention. No abdominal bruits. No CVA tenderness. Musculoskeletal: No lower extremity tenderness nor edema.  No joint effusions. Neurologic:  Normal speech and language. No gross focal neurologic deficits are appreciated. No gait instability. Skin:  Skin is warm, dry and intact. No rash noted. Psychiatric: Mood and affect are normal. Speech and behavior are normal.  ____________________________________________   LABS (all labs ordered are listed, but only abnormal results are displayed)  Labs Reviewed - No data to display ____________________________________________   RADIOLOGY  Positive for bronchitis. No acute pneumonia noted. ____________________________________________   PROCEDURES  Procedure(s) performed: None  Critical Care performed: No  ____________________________________________   INITIAL IMPRESSION / ASSESSMENT AND PLAN / ED COURSE  Pertinent labs & imaging results that were available during my care of the patient were reviewed by me and considered in my medical decision making (see chart for details).  URI with bronchiolitic symptoms. DuoNeb given while in the ED. Patient discharged home with Z-Pak and Robitussin-AC. Patient follow-up with her PCP as scheduled for referral to pulmonology if  needed. She voices no other emergency medical complaints at this time. ____________________________________________   FINAL CLINICAL IMPRESSION(S) / ED DIAGNOSES  Final diagnoses:  Bronchitis  URI, acute     This chart was dictated using voice recognition software/Dragon. Despite best efforts to proofread, errors can occur which can change the meaning. Any change was purely unintentional.   Arlyss Repress, PA-C 05/20/15 [place] Yao, MD 05/20/15 [phone]

## 2015-05-20 NOTE — ED Notes (Signed)
No answer in waiting room 

## 2015-05-27 DIAGNOSIS — I8290 Acute embolism and thrombosis of unspecified vein: Secondary | ICD-10-CM | POA: Insufficient documentation

## 2015-05-27 DIAGNOSIS — Z9189 Other specified personal risk factors, not elsewhere classified: Secondary | ICD-10-CM | POA: Insufficient documentation

## 2015-05-27 DIAGNOSIS — D6859 Other primary thrombophilia: Secondary | ICD-10-CM | POA: Insufficient documentation

## 2015-05-27 DIAGNOSIS — O3480 Maternal care for other abnormalities of pelvic organs, unspecified trimester: Secondary | ICD-10-CM

## 2015-05-27 DIAGNOSIS — Z8669 Personal history of other diseases of the nervous system and sense organs: Secondary | ICD-10-CM | POA: Insufficient documentation

## 2015-05-27 DIAGNOSIS — M1990 Unspecified osteoarthritis, unspecified site: Secondary | ICD-10-CM | POA: Insufficient documentation

## 2015-05-27 DIAGNOSIS — R350 Frequency of micturition: Secondary | ICD-10-CM | POA: Insufficient documentation

## 2015-05-27 DIAGNOSIS — N3289 Other specified disorders of bladder: Secondary | ICD-10-CM | POA: Insufficient documentation

## 2015-05-27 DIAGNOSIS — M6289 Other specified disorders of muscle: Secondary | ICD-10-CM | POA: Insufficient documentation

## 2015-05-27 DIAGNOSIS — R319 Hematuria, unspecified: Secondary | ICD-10-CM | POA: Insufficient documentation

## 2015-05-27 DIAGNOSIS — N6019 Diffuse cystic mastopathy of unspecified breast: Secondary | ICD-10-CM | POA: Insufficient documentation

## 2015-05-27 DIAGNOSIS — N83209 Unspecified ovarian cyst, unspecified side: Secondary | ICD-10-CM | POA: Insufficient documentation

## 2015-05-27 DIAGNOSIS — N6009 Solitary cyst of unspecified breast: Secondary | ICD-10-CM | POA: Insufficient documentation

## 2015-05-27 DIAGNOSIS — K219 Gastro-esophageal reflux disease without esophagitis: Secondary | ICD-10-CM | POA: Insufficient documentation

## 2015-05-27 DIAGNOSIS — N816 Rectocele: Secondary | ICD-10-CM | POA: Insufficient documentation

## 2015-10-05 DIAGNOSIS — K649 Unspecified hemorrhoids: Secondary | ICD-10-CM | POA: Insufficient documentation

## 2015-11-29 ENCOUNTER — Telehealth (INDEPENDENT_AMBULATORY_CARE_PROVIDER_SITE_OTHER): Payer: Self-pay

## 2015-11-29 NOTE — Telephone Encounter (Signed)
Spoke with patient regarding what Dr. Lucky Cowboy stated concerning her vascular issues. See notes below.

## 2015-11-29 NOTE — Telephone Encounter (Signed)
Patient called wanting to know if the condition of her legs would cause problems with having lipoma's removed from her right leg ? She was last seen in the office 10/19/15.

## 2015-12-06 ENCOUNTER — Emergency Department (HOSPITAL_COMMUNITY)
Admission: EM | Admit: 2015-12-06 | Discharge: 2015-12-07 | Disposition: A | Discharge status: Home / Self Care | Payer: Medicare Other | Attending: Emergency Medicine | Admitting: Emergency Medicine

## 2015-12-06 ENCOUNTER — Emergency Department (HOSPITAL_COMMUNITY): Payer: Medicare Other

## 2015-12-06 ENCOUNTER — Encounter (HOSPITAL_COMMUNITY): Payer: Self-pay

## 2015-12-06 DIAGNOSIS — J45909 Unspecified asthma, uncomplicated: Secondary | ICD-10-CM | POA: Diagnosis not present

## 2015-12-06 DIAGNOSIS — R079 Chest pain, unspecified: Secondary | ICD-10-CM | POA: Diagnosis present

## 2015-12-06 DIAGNOSIS — R0789 Other chest pain: Secondary | ICD-10-CM | POA: Diagnosis not present

## 2015-12-06 DIAGNOSIS — Z7901 Long term (current) use of anticoagulants: Secondary | ICD-10-CM | POA: Insufficient documentation

## 2015-12-06 DIAGNOSIS — Z79899 Other long term (current) drug therapy: Secondary | ICD-10-CM | POA: Diagnosis not present

## 2015-12-06 LAB — URINALYSIS, ROUTINE W REFLEX MICROSCOPIC
BILIRUBIN URINE: NEGATIVE
Glucose, UA: NEGATIVE mg/dL
Hgb urine dipstick: NEGATIVE
Ketones, ur: NEGATIVE mg/dL
Leukocytes, UA: NEGATIVE
NITRITE: NEGATIVE
PH: 6 (ref 5.0–8.0)
Protein, ur: NEGATIVE mg/dL
SPECIFIC GRAVITY, URINE: 1.007 (ref 1.005–1.030)

## 2015-12-06 LAB — BASIC METABOLIC PANEL
Anion gap: 7 (ref 5–15)
BUN: 6 mg/dL (ref 6–20)
CHLORIDE: 108 mmol/L (ref 101–111)
CO2: 26 mmol/L (ref 22–32)
CREATININE: 0.87 mg/dL (ref 0.44–1.00)
Calcium: 9.5 mg/dL (ref 8.9–10.3)
GFR calc Af Amer: >60 mL/min (ref >60)
GFR calc non Af Amer: >60 mL/min (ref >60)
GLUCOSE: 101 mg/dL — AB (ref 65–99)
Potassium: 4.3 mmol/L (ref 3.5–5.1)
Sodium: 141 mmol/L (ref 135–145)

## 2015-12-06 LAB — CBC
HCT: 39.2 % (ref 36.0–46.0)
Hemoglobin: 13 g/dL (ref 12.0–15.0)
MCH: 31 pg (ref 26.0–34.0)
MCHC: 33.2 g/dL (ref 30.0–36.0)
MCV: 93.3 fL (ref 78.0–100.0)
PLATELETS: 244 10*3/uL (ref 150–400)
RBC: 4.2 MIL/uL (ref 3.87–5.11)
RDW: 13.8 % (ref 11.5–15.5)
WBC: 6.3 10*3/uL (ref 4.0–10.5)

## 2015-12-06 LAB — I-STAT TROPONIN, ED: Troponin i, poc: 0 ng/mL (ref 0.00–0.08)

## 2015-12-06 MED ORDER — IOPAMIDOL (ISOVUE-370) INJECTION 76%
INTRAVENOUS | Status: AC
  Administered 2015-12-06: 100 mL
  Filled 2015-12-06: qty 100

## 2015-12-06 NOTE — ED Notes (Signed)
Papers reviewed with patient and she verbalizes understanding and intent to follow up

## 2015-12-06 NOTE — ED Provider Notes (Signed)
[facility] DEPT Provider Note   CSN: [id number] Arrival date & time: 12/06/15  1202     History   Chief Complaint Chief Complaint  Patient presents with  . Fever  . Chest Pain    HPI April [name] is a 45 y.o. female.  Pt presents to the ED today with cp and fever.  Pt had 2 lipomas removed from her right thigh about 1 month ago.  She developed cp a few days ago.  The pt does have a hx of factor V leiden deficiency and prior DVT.  The pt developed severe anemia while on Xarelto, so she was taken off of them.  The pt has been on doxycycline for cellulitis around lipoma site.  These areas now look good.  Pt has an appt tomorrow with a cardiologist.  Pt said she also has a hx of CHF.      Past Medical History:  Diagnosis Date  . Anxiety 12/14/2011  . Asthma    no inhaler  . Factor 5 Leiden mutation, heterozygous ([place])   . Fibromyalgia   . Fracture of 5th metatarsal 03/2011   left foot -    . Interstitial cystitis   . Ovarian cyst   . Pancreatitis   . Pelvic floor dysfunction   . Sinus infection    chronic  . 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    Patient Active Problem List   Diagnosis Date Noted  . Sensory disturbance 12/16/2011  . Interstitial cystitis 12/15/2011  . Hematuria 12/15/2011  . Acute DVT (deep venous thrombosis)-right gonadal 12/14/2011  . SOB (shortness of breath) on exertion 12/14/2011  . Factor 5 Leiden mutation, heterozygous ([place]) 12/14/2011  . Anxiety 12/14/2011  . Fibromyalgia 12/14/2011  . ABDOMINAL PAIN OTHER SPECIFIED SITE 03/14/2010    Past Surgical History:  Procedure Laterality Date  . ABDOMINAL HYSTERECTOMY    . CHOLECYSTECTOMY    . CYSTOSCOPY WITH HYDRODISTENSION AND BIOPSY  04/21/2011   Dr Philis Fendt  . LAPAROSCOPIC ASSISTED VAGINAL HYSTERECTOMY  08/21/2011   Procedure: LAPAROSCOPIC ASSISTED VAGINAL HYSTERECTOMY;   Surgeon: Cyril Mourning, MD;  Location: [facility];  Service: Gynecology;  Laterality: N/A;  OPEN LAPAROSCOPIC  . LAPAROSCOPIC NISSEN FUNDOPLICATION  [id number]  . SALPINGOOPHORECTOMY  08/21/2011   Procedure: SALPINGO OOPHERECTOMY;  Surgeon: Cyril Mourning, MD;  Location: [facility];  Service: Gynecology;  Laterality: Bilateral;  . TUBAL LIGATION  2010  . WISDOM TOOTH EXTRACTION      OB History    Gravida Para Term Preterm AB Living   4 4 4     4    SAB TAB Ectopic Multiple Live Births                   Home Medications    Prior to Admission medications   Medication Sig Start Date End Date Taking? Authorizing Provider  albuterol (PROVENTIL HFA;VENTOLIN HFA) 108 (90 BASE) MCG/ACT inhaler Inhale 1 puff into the lungs as needed. For shortness of breath or wheezing    Historical Provider, MD  azithromycin (ZITHROMAX Z-PAK) 250 MG tablet Take 2 tablets (500 mg) on  Day 1,  followed by 1 tablet (250 mg) once daily on Days 2 through 5. 05/20/15   Arlyss Repress, PA-C  budesonide-formoterol (SYMBICORT) 80-4.5 MCG/ACT inhaler Inhale 2 puffs into the lungs 2 (two) times daily.    Historical Provider, MD  Calcium-Phosphorus-Vitamin D (CALCIUM GUMMIES PO) Take 1 tablet by mouth every evening.    Historical Provider, MD  clobetasol ointment (TEMOVATE) [id number] % Apply 1 application topically daily as needed. For pain/irriation    Historical Provider, MD  Cyanocobalamin 5000 MCG SUBL Place 5,000 mcg under the tongue daily.     Historical Provider, MD  Evening Primrose Oil 1000 MG CAPS Take 1,000 mg by mouth every evening.     Historical Provider, MD  Fexofenadine HCl (ALLEGRA PO) Take 1 tablet by mouth daily as needed. For allergies/sinues    Historical Provider, MD  fluconazole (DIFLUCAN) 150 MG tablet Take 150 mg by mouth every 7 (seven) days. Take on Mondays    Historical Provider, MD  fluticasone (FLONASE) 50 MCG/ACT nasal spray Place 1 spray into the nose daily as needed. For stuffy nose    Historical  Provider, MD  gabapentin (NEURONTIN) 300 MG capsule Take 300 mg by mouth 3 (three) times daily.    Historical Provider, MD  guaifenesin (HUMIBID E) 400 MG TABS Take 400 mg by mouth 2 (two) times daily as needed. For congestion    Historical Provider, MD  guaiFENesin-codeine 100-10 MG/5ML syrup Take 10 mLs by mouth every 4 (four) hours as needed for cough. 05/20/15   Pierce Crane Beers, PA-C  hydrochlorothiazide (HYDRODIURIL) 25 MG tablet 1 tablet on M-W-F (3 X per week) 07/15/12   Josue Hector, MD  HYDROcodone-acetaminophen [facility]) 10-325 MG per tablet Take 1 tablet by mouth every 6 (six) hours as needed for pain. For pain    Historical Provider, MD  hydrOXYzine (VISTARIL) 25 MG capsule Take 50 mg by mouth at bedtime.     Historical Provider, MD  Milnacipran (SAVELLA) 50 MG TABS Take 50 mg by mouth 2 (two) times daily.    Historical Provider, MD  montelukast (SINGULAIR) 10 MG tablet Take 10 mg by mouth at bedtime.    Historical Provider, MD  Multiple Vitamin (MULITIVITAMIN WITH MINERALS) TABS Take 1 tablet by mouth every evening.     Historical Provider, MD  oxybutynin (DITROPAN) 5 MG tablet Take 5 mg by mouth 3 (three) times daily as needed. For bladder spasms    Historical Provider, MD  pentosan polysulfate (ELMIRON) 100 MG capsule Take 200 mg by mouth 2 (two) times daily before a meal.     Historical Provider, MD  PRESCRIPTION MEDICATION Place 10 mg vaginally every evening. Diazepam suppository compounded by [facility] Provider, MD  pseudoephedrine (SUDAFED) 30 MG tablet Take 60 mg by mouth 4 (four) times daily.    Historical Provider, MD  Rivaroxaban (XARELTO) 15 MG TABS tablet Take 1 tablet (15 mg total) by mouth 2 (two) times daily. 12/17/11   Orson Eva, MD  Simethicone 125 MG CAPS Take 125 mg by mouth 4 (four) times daily.    Historical Provider, MD  spironolactone (ALDACTONE) 50 MG tablet 50 mg. Take 50 mg by mouth daily.    Historical Provider, MD  traMADol (ULTRAM) 50 MG  tablet Take by mouth. Take 50 mg by mouth every 6 (six) hours. For pain    Historical Provider, MD  triamterene-hydrochlorothiazide (DYAZIDE) 37.5-25 MG per capsule Take 1 capsule by mouth every morning.    Historical Provider, MD  valACYclovir (VALTREX) 500 MG tablet Take 500 mg by mouth at bedtime.     Historical Provider, MD    Family History Family History  Problem Relation Age of Onset  . Anesthesia problems Neg Hx  Social History Social History  Substance Use Topics  . Smoking status: Never Smoker  . Smokeless tobacco: Never Used  . Alcohol use No     Allergies   Amitriptyline; Bromelains [pineapple extract]; Celexa [citalopram hydrobromide]; Citalopram; Methylene blue; Sertraline; Sulfonamide derivatives; Bromaline [albertsons di bromm]; Other; and Penicillins   Review of Systems Review of Systems  Constitutional: Positive for fever.  Cardiovascular: Positive for chest pain.  All other systems reviewed and are negative.    Physical Exam Updated Vital Signs BP 121/85 (BP Location: Left Arm)   Pulse 77   Temp 98.3 F (36.8 C) (Oral)   Resp 22   LMP 05/20/2011   SpO2 99%   Physical Exam  Constitutional: She is oriented to person, place, and time. She appears well-developed and well-nourished.  HENT:  Head: Normocephalic and atraumatic.  Right Ear: External ear normal.  Left Ear: External ear normal.  Nose: Nose normal.  Mouth/Throat: Oropharynx is clear and moist.  Eyes: Conjunctivae and EOM are normal. Pupils are equal, round, and reactive to light.  Neck: Normal range of motion. Neck supple.  Cardiovascular: Normal rate, regular rhythm, normal heart sounds and intact distal pulses.   Pulmonary/Chest: Effort normal and breath sounds normal.  Abdominal: Soft. Bowel sounds are normal.  Musculoskeletal: Normal range of motion.  Neurological: She is alert and oriented to person, place, and time.  Skin: Skin is warm and dry.     Psychiatric: She has a  normal mood and affect. Her behavior is normal. Judgment and thought content normal.  Nursing note and vitals reviewed.    ED Treatments / Results  Labs (all labs ordered are listed, but only abnormal results are displayed) Labs Reviewed  BASIC METABOLIC PANEL - Abnormal; Notable for the following:       Result Value   Glucose, Bld 101 (*)    All other components within normal limits  CBC  URINALYSIS, ROUTINE W REFLEX MICROSCOPIC (NOT AT [facility])  I-STAT TROPOININ, ED    EKG  EKG Interpretation  Date/Time:  Monday December 06 2015 12:27:34 EST Ventricular Rate:  94 PR Interval:  104 QRS Duration: 80 QT Interval:  352 QTC Calculation: 440 R Axis:   43 Text Interpretation:  Sinus rhythm with short PR Otherwise normal ECG Confirmed by Gilford Raid MD, Najmah Carradine ([id number]) on 12/06/2015 4:07:40 PM       Radiology Dg Chest 2 View  Result Date: 12/06/2015 CLINICAL DATA:  Fever. EXAM: CHEST  2 VIEW COMPARISON:  05/20/2015. FINDINGS: Mediastinum and hilar structures are normal. Lungs are clear. No pleural effusion or pneumothorax. Thoracolumbar spine scoliosis. Interposition of the colon under the left hemidiaphragm IMPRESSION: No acute cardiopulmonary disease. Electronically Signed   By: Marcello Moores  Register   On: 12/06/2015 13:17   Ct Angio Chest Pe W And/or Wo Contrast  Result Date: 12/06/2015 CLINICAL DATA:  Pain and fever after removal of lipoma.  Chest pain. EXAM: CT ANGIOGRAPHY CHEST WITH CONTRAST TECHNIQUE: Multidetector CT imaging of the chest was performed using the standard protocol during bolus administration of intravenous contrast. Multiplanar CT image reconstructions and MIPs were obtained to evaluate the vascular anatomy. CONTRAST:  100 cc of Isovue 370 COMPARISON:  04/09/2012. FINDINGS: Cardiovascular: Satisfactory opacification of the pulmonary arteries to the segmental level. No evidence of pulmonary embolism. Normal heart size. No pericardial effusion. Mediastinum/Nodes: Similar  appearance of increased soft tissue within the anterior mediastinum likely reflecting thymus tissue. No mediastinal or hilar adenopathy. Lungs/Pleura: No pleural effusion. No airspace consolidation or atelectasis.  A mosaic attenuation pattern noted within both lungs which may be a manifestation of small airways disease. Upper Abdomen: No aggressive lytic or sclerotic bone lesions. Musculoskeletal: No chest wall abnormality. No acute or significant osseous findings. Review of the MIP images confirms the above findings. IMPRESSION: 1. No evidence for acute pulmonary embolus. 2. There is a mosaic attenuation pattern identified in both lungs which may reflex at small airways disease. Electronically Signed   By: Kerby Moors M.D.   On: 12/06/2015 15:49    Procedures Procedures (including critical care time)  Medications Ordered in ED Medications  iopamidol (ISOVUE-370) 76 % injection (100 mLs  Contrast Given 12/06/15 1524)     Initial Impression / Assessment and Plan / ED Course  I have reviewed the triage vital signs and the nursing notes.  Pertinent labs & imaging results that were available during my care of the patient were reviewed by me and considered in my medical decision making (see chart for details).  Clinical Course as of Dec 05 1608  Mon Dec 06, 2015  1607 ED EKG within 10 minutes [JH]    Clinical Course User Index [JH] Isla Pence, MD     Pt looks good.  No evidence of ischemia on EKG.  CT Chest negative for PE.  Final Clinical Impressions(s) / ED Diagnoses   Final diagnoses:  Chest wall pain    New Prescriptions New Prescriptions   No medications on file     Isla Pence, MD 12/06/15 1610

## 2015-12-06 NOTE — ED Triage Notes (Signed)
Pt reports she had a lipoma removal and now has had chest pain and fever. Pt reports she was sent here to rule out possible PE. No shortness of breath noted. Vital signs stable in triage.

## 2015-12-07 NOTE — Progress Notes (Signed)
Cardiology Office Note    Date:  12/09/2015   ID:  April [name], DOB 1971-01-11, MRN [id number]  PCP:  Volanda Napoleon, MD  Cardiologist:  New (previously seen by Dr. Johnsie Cancel 06/2012)  Chief Complaint: Chest pain  History of Present Illness:   April [name] is a 45 y.o. 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 Bynum Bellows @ [place]) who was on Xarelto for this however discontinued due to severe anemia, fibromyalgia, asthma and RA who presented for chest pain evaluation.   She has hx of factor V leiden and hypercoagulable state due to hospitalization 1/13 with gonadal vein 04/2012 showed normal LV function to 60% and grade 1 DD. No valvular abnormality or pHTN. and IVC thrombus. Seen by Dr Trula Slade [facility] and started on xarelto. LE venous doppler was negative.   She was evaluated by Dr. Johnsie Cancel once 06/2012 for dyspnea on exertion. She has echo done at [place] showed normal EF with grade 1 DD. It was felt that her dyspnea is not due to diastolic dysfunction. Diuretics adjusted and advised to f/u as needed.   She is also followed at [facility]. LE venous doppler showed no SVT or SVT however showed reflux GSV and plan for surgery November 17th, 2017.   Echo done by PCP 10/06/15 showed normal EF function of 55-75%, moderate DD, normal aortic valve, mild MR, mild TR and normal pulmonic valve.   The patient was seen in ER yesterday 12/06/15 for chest pain. EKG reassuring. CTA negative for PE however concerning for small airway disease. Troponin negative. Discharge home from ER.  The pt has been on doxycycline for cellulitis around lipoma site.  Today here with multiple complains. She had intermittent "squeezing" chest pain for years lasing 2-3 minutes however this got worsen since her lipoma removal 11/05/15 on R thigh. Now its occurs more frequent and last longer. Occurs with and without  exertion. No associated radiation, nausea, vomiting or diaphoresis. Admits to having chronic intermittent dyspnea, LE edema, orthopnea. No PND or syncope. Also complains for palpitation for years. Occasionally goes to >200. Last episode in spring 2017. She used to drink lots of caffeineted  Product however discontinued and since then noted improvement. No use of tobacco or alcohol or illicit drug use. She feels better when she walks.    Past Medical History:  Diagnosis Date  . Anxiety 12/14/2011  . Asthma    no inhaler  . Factor 5 Leiden mutation, heterozygous ([place])   . Fibromyalgia   . Fracture of 5th metatarsal 03/2011   left foot -    . Heart palpitations   . Interstitial cystitis   . Ovarian cyst   . Pancreatitis   . Pelvic floor dysfunction   . Rheumatoid arteritis   . Sinus infection    chronic  . Thrombosis 12/14/2011   Thrombosis of R gonadal vein with extension of thrombus into IVC to level of R renal vein per CT 12/11/12 of [place].    Past Surgical History:  Procedure Laterality Date  . ABDOMINAL HYSTERECTOMY    . CHOLECYSTECTOMY    . CYSTOSCOPY WITH HYDRODISTENSION AND BIOPSY  04/21/2011   Dr Philis Fendt  . LAPAROSCOPIC ASSISTED VAGINAL HYSTERECTOMY  08/21/2011   Procedure: LAPAROSCOPIC ASSISTED VAGINAL HYSTERECTOMY;  Surgeon: Cyril Mourning, MD;  Location: [facility];  Service: Gynecology;  Laterality: N/A;  OPEN LAPAROSCOPIC  .  LAPAROSCOPIC NISSEN FUNDOPLICATION  [id number]  . SALPINGOOPHORECTOMY  08/21/2011   Procedure: SALPINGO OOPHERECTOMY;  Surgeon: Cyril Mourning, MD;  Location: [facility];  Service: Gynecology;  Laterality: Bilateral;  . TUBAL LIGATION  2010  . WISDOM TOOTH EXTRACTION      Current Medications:  Prior to Admission medications   Medication Sig Start Date End Date Taking? Authorizing Provider  albuterol (PROVENTIL HFA;VENTOLIN HFA) 108 (90 BASE) MCG/ACT inhaler Inhale 1 puff into the lungs as needed. For shortness of breath or wheezing   Yes  Historical Provider, MD  budesonide-formoterol (SYMBICORT) 80-4.5 MCG/ACT inhaler Inhale 2 puffs into the lungs 2 (two) times daily.   Yes Historical Provider, MD  Calcium-Phosphorus-Vitamin D (CALCIUM GUMMIES PO) Take 1 tablet by mouth every evening.   Yes Historical Provider, MD  clobetasol ointment (TEMOVATE) [id number] % Apply 1 application topically daily as needed. For pain/irriation   Yes Historical Provider, MD  Cyanocobalamin 5000 MCG SUBL Place 5,000 mcg under the tongue daily.    Yes Historical Provider, MD  Evening Primrose Oil 1000 MG CAPS Take 1,000 mg by mouth every evening.    Yes Historical Provider, MD  Fexofenadine HCl (ALLEGRA PO) Take 1 tablet by mouth daily as needed. For allergies/sinues   Yes Historical Provider, MD  fluconazole (DIFLUCAN) 150 MG tablet Take 150 mg by mouth every 7 (seven) days. Take on Mondays   Yes Historical Provider, MD  fluticasone (FLONASE) 50 MCG/ACT nasal spray Place 1 spray into the nose daily as needed. For stuffy nose   Yes Historical Provider, MD  gabapentin (NEURONTIN) 300 MG capsule Take 300 mg by mouth 3 (three) times daily.   Yes Historical Provider, MD  guaifenesin (HUMIBID E) 400 MG TABS Take 400 mg by mouth 2 (two) times daily as needed. For congestion   Yes Historical Provider, MD  hydrochlorothiazide (HYDRODIURIL) 25 MG tablet 1 tablet on M-W-F (3 X per week) 07/15/12  Yes Josue Hector, MD  HYDROcodone-acetaminophen (NORCO) 10-325 MG per tablet Take 1 tablet by mouth every 6 (six) hours as needed for pain. For pain   Yes Historical Provider, MD  hydrOXYzine (VISTARIL) 25 MG capsule Take 50 mg by mouth at bedtime.    Yes Historical Provider, MD  Milnacipran (SAVELLA) 50 MG TABS Take 50 mg by mouth 2 (two) times daily.   Yes Historical Provider, MD  montelukast (SINGULAIR) 10 MG tablet Take 10 mg by mouth at bedtime.   Yes Historical Provider, MD  Multiple Vitamin (MULITIVITAMIN WITH MINERALS) TABS Take 1 tablet by mouth every evening.    Yes  Historical Provider, MD  pentosan polysulfate (ELMIRON) 100 MG capsule Take 200 mg by mouth 2 (two) times daily before a meal.    Yes Historical Provider, MD  PRESCRIPTION MEDICATION Place 10 mg vaginally every evening. Diazepam suppository compounded by [facility]   Yes Historical Provider, MD  pseudoephedrine (SUDAFED) 30 MG tablet Take 60 mg by mouth 4 (four) times daily.   Yes Historical Provider, MD  Simethicone 125 MG CAPS Take 125 mg by mouth 4 (four) times daily.   Yes Historical Provider, MD  traMADol (ULTRAM) 50 MG tablet Take by mouth. Take 50 mg by mouth every 6 (six) hours. For pain   Yes Historical Provider, MD  valACYclovir (VALTREX) 500 MG tablet Take 500 mg by mouth at bedtime.    Yes Historical Provider, MD    Allergies:   Amitriptyline; Bromelains [pineapple extract]; Celexa [citalopram hydrobromide]; Citalopram; Methylene blue; Sertraline; Sulfonamide derivatives;  Bromaline [albertsons di bromm]; Other; and Penicillins   Social History   Social History  . Marital status: Married    Spouse name: N/A  . Number of children: N/A  . Years of education: N/A   Social History Main Topics  . Smoking status: Never Smoker  . Smokeless tobacco: Never Used  . Alcohol use No  . Drug use: No  . Sexual activity: Yes    Birth control/ protection: Surgical   Other Topics Concern  . None   Social History Narrative  . None     Family History:   Mother had CHF and "angina tear" MGM CHF PGF MI at age 5 and diet of this Brother had stroke at age 93  ROS:   Please see the history of present illness.    ROS All other systems reviewed and are negative.   PHYSICAL EXAM:   VS:  BP 122/84   Pulse (!) 110   Ht 5\' 5"  (1.651 m)   Wt 194 lb (88 kg)   LMP 05/20/2011   BMI 32.28 kg/m    GEN: Well nourished, well developed, in no acute distress  HEENT: normal  Neck: no JVD, carotid bruits, or masses Cardiac: Regular tachycardic; no murmurs, rubs, or gallops,no edema    Respiratory:  clear to auscultation bilaterally, normal work of breathing GI: soft, nontender, nondistended, + BS MS: no deformity or atrophy  Skin: warm and dry, no rash Neuro:  Alert and Oriented x 3, Strength and sensation are intact Psych: euthymic mood, full affect  Wt Readings from Last 3 Encounters:  12/09/15 194 lb (88 kg)  05/20/15 174 lb (78.9 kg)  07/05/12 208 lb (94.3 kg)      Studies/Labs Reviewed:   EKG:  EKG is not  ordered today.  EKG 12/06/15 showed NSR at rate of 94bpm  Recent Labs: Normal IRON study 10/11/15 HGB 12.2 10/11/15 12/06/2015: BUN 6; Creatinine, Ser 0.87; Hemoglobin 13.0; Platelets 244; Potassium 4.3; Sodium 141   Lipid Panel No results found for: CHOL, TRIG, HDL, CHOLHDL, VLDL, LDLCALC, LDLDIRECT  Additional studies/ records that were reviewed today include:   As above   ASSESSMENT & PLAN:    1. Chest pain - Her chest pain is intermittent that has been getting worse. She thinks that this is different than her asthma or GERD (hx of nissean fundoplication). Recent EKG and troponin reassuring. Chest pain is different than palpitation. No PE on CTA. Strong family hx of CAD. Will get exercise Myoview.   2. 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 Followed by Dr. Bynum Bellows @ [place].  3. PVD - She is also followed at [facility]. LE venous doppler showed no SVT or SVT however showed reflux GSV and plan for surgery November 17th, 2017.   4. Palpitations - improved after discontinuation of caffeineted  product. No syncope or dizziness. Will add BB. She states that TSH has been normal multiple times.   5. Chronic diastolic heart failure - She has chronic intermittent dyspnea, LE edema (likley due to PVD). She is euvolemic today. Can add PRN lasix if needed.  - Echo done by PCP 10/06/15 showed normal EF function  of 55-75%, moderate DD (it was mild in 2014), normal aortic valve, mild MR, mild TR and normal pulmonic valve. She is not taking HCTZ,   Discussed plan with DOD Dr.  Camnitz. She lives in [place] and will follow over there. She will benefits from adding ASA and statin pending stress test.     Medication Adjustments/Labs and Tests Ordered: Current medicines are reviewed at length with the patient today.  Concerns regarding medicines are outlined above.  Medication changes, Labs and Tests ordered today are listed in the Patient Instructions below. Patient Instructions  Your physician has recommended you make the following change in your medication:  START  METOPROLOL  25 MG  TWICE  DAILY   Your physician has requested that you have en exercise stress myoview. For further information please visit [email or web address]. Please follow instruction sheet, as given.    Your physician recommends that you schedule a follow-up appointment in: 3-4  WEEKS  IN [place], [place], [place]  12/09/2015 9:28 AM    [address], [place], [place]  [address] Phone: [phone]; Fax: [phone]

## 2015-12-09 ENCOUNTER — Encounter: Payer: Self-pay | Admitting: Physician Assistant

## 2015-12-09 ENCOUNTER — Ambulatory Visit (INDEPENDENT_AMBULATORY_CARE_PROVIDER_SITE_OTHER): Payer: Medicare Other | Admitting: Physician Assistant

## 2015-12-09 VITALS — BP 122/84 | HR 110 | Ht 65.0 in | Wt 194.0 lb

## 2015-12-09 DIAGNOSIS — I8290 Acute embolism and thrombosis of unspecified vein: Secondary | ICD-10-CM | POA: Diagnosis not present

## 2015-12-09 DIAGNOSIS — R002 Palpitations: Secondary | ICD-10-CM

## 2015-12-09 DIAGNOSIS — I5032 Chronic diastolic (congestive) heart failure: Secondary | ICD-10-CM | POA: Diagnosis not present

## 2015-12-09 DIAGNOSIS — R0789 Other chest pain: Secondary | ICD-10-CM

## 2015-12-09 DIAGNOSIS — I739 Peripheral vascular disease, unspecified: Secondary | ICD-10-CM

## 2015-12-09 MED ORDER — METOPROLOL TARTRATE 25 MG PO TABS: 25.0000 mg | ORAL_TABLET | Freq: Two times a day (BID) | ORAL | 3 refills | Status: DC

## 2015-12-09 NOTE — Patient Instructions (Addendum)
Your physician has recommended you make the following change in your medication:  START  METOPROLOL  25 MG  TWICE  DAILY   Your physician has requested that you have en exercise stress myoview. For further information please visit [email or web address]. Please follow instruction sheet, as given.    Your physician recommends that you schedule a follow-up appointment in: 3-4  [place] OFFICE

## 2015-12-10 ENCOUNTER — Telehealth (INDEPENDENT_AMBULATORY_CARE_PROVIDER_SITE_OTHER): Payer: Self-pay

## 2015-12-10 NOTE — Telephone Encounter (Signed)
Patient called to inform that she was prescribe a new med (metoprolol) and also to make sure that she did not have to stop it for her laser ablation

## 2015-12-13 ENCOUNTER — Telehealth (HOSPITAL_COMMUNITY): Payer: Self-pay | Admitting: *Deleted

## 2015-12-13 NOTE — Telephone Encounter (Signed)
Patient given detailed instructions per Myocardial Perfusion Study Information Sheet for the test on 12/16/15 at 0730. Patient notified to arrive 15 minutes early and that it is imperative to arrive on time for appointment to keep from having the test rescheduled.  If you need to cancel or reschedule your appointment, please call the office within 24 hours of your appointment. Failure to do so may result in a cancellation of your appointment, and a $50 no show fee. Patient verbalized understanding.April [name], Ranae Palms

## 2015-12-15 ENCOUNTER — Other Ambulatory Visit (INDEPENDENT_AMBULATORY_CARE_PROVIDER_SITE_OTHER): Payer: Self-pay | Admitting: Vascular Surgery

## 2015-12-15 DIAGNOSIS — I8311 Varicose veins of right lower extremity with inflammation: Secondary | ICD-10-CM

## 2015-12-15 DIAGNOSIS — I8312 Varicose veins of left lower extremity with inflammation: Principal | ICD-10-CM

## 2015-12-16 ENCOUNTER — Ambulatory Visit (HOSPITAL_COMMUNITY): Payer: Medicare Other | Attending: Cardiology

## 2015-12-16 DIAGNOSIS — R0789 Other chest pain: Secondary | ICD-10-CM

## 2015-12-16 LAB — MYOCARDIAL PERFUSION IMAGING
CHL CUP MPHR: 176 {beats}/min
CHL CUP RESTING HR STRESS: 73 {beats}/min
Estimated workload: 7 METS
Exercise duration (min): 6 min
Exercise duration (sec): 0 s
LHR: 0.43
LV sys vol: 29 mL
LVDIAVOL: 80 mL (ref 46–106)
NUC STRESS TID: 0.8
Peak HR: 151 {beats}/min
Percent HR: 85 %
SDS: 8
SRS: 2
SSS: 10

## 2015-12-16 MED ORDER — TECHNETIUM TC 99M TETROFOSMIN IV KIT
10.5000 | PACK | Freq: Once | INTRAVENOUS | Status: AC | PRN
  Administered 2015-12-16: 10.5 via INTRAVENOUS
  Filled 2015-12-16: qty 11

## 2015-12-16 MED ORDER — TECHNETIUM TC 99M TETROFOSMIN IV KIT
32.4000 | PACK | Freq: Once | INTRAVENOUS | Status: AC | PRN
  Administered 2015-12-16: 32.4 via INTRAVENOUS
  Filled 2015-12-16: qty 33

## 2015-12-17 ENCOUNTER — Encounter (INDEPENDENT_AMBULATORY_CARE_PROVIDER_SITE_OTHER): Payer: Self-pay | Admitting: Vascular Surgery

## 2015-12-17 ENCOUNTER — Ambulatory Visit (INDEPENDENT_AMBULATORY_CARE_PROVIDER_SITE_OTHER): Payer: Medicare Other | Admitting: Vascular Surgery

## 2015-12-17 VITALS — BP 116/81 | HR 77 | Resp 17 | Ht 65.0 in | Wt 194.8 lb

## 2015-12-17 DIAGNOSIS — M797 Fibromyalgia: Secondary | ICD-10-CM

## 2015-12-17 DIAGNOSIS — I8390 Asymptomatic varicose veins of unspecified lower extremity: Secondary | ICD-10-CM | POA: Insufficient documentation

## 2015-12-17 DIAGNOSIS — I83811 Varicose veins of right lower extremities with pain: Secondary | ICD-10-CM

## 2015-12-17 DIAGNOSIS — I83813 Varicose veins of bilateral lower extremities with pain: Secondary | ICD-10-CM

## 2015-12-17 NOTE — Assessment & Plan Note (Signed)
See laser procedure

## 2015-12-17 NOTE — Progress Notes (Signed)
The patient's Right lower extremity was sterilely prepped and draped. The ultrasound machine was used to visualize the saphenous vein throughout its course. A segment in the mid to upper calf was selected for access. The saphenous vein was accessed without difficulty using ultrasound guidance with a micro puncture needle. A micro puncture wire and sheath were then placed. A 0.018 wire was placed beyond the saphenofemoral junction through the sheath and the micro puncture sheath was removed. The 65 cm sheath was then placed over the wire and the wire and dilator were removed. The laser fiber was placed through the sheath and its tip was placed approximately 5 cm below the saphenofemoral junction. Tumescent anesthesia was then created with a dilute lidocaine solution. Laser energy was then delivered with constant withdrawal of the sheath and laser fiber. Approximately 1283 Joules of energy were delivered over a length of 35 cm using the 1470 Hz VenaCure machine at [employer]. Sterile dressings were placed. The patient tolerated the procedure well without complications.

## 2015-12-19 ENCOUNTER — Emergency Department
Admission: EM | Admit: 2015-12-19 | Discharge: 2015-12-19 | Disposition: A | Discharge status: Home / Self Care | Payer: Medicare Other | Attending: Emergency Medicine | Admitting: Emergency Medicine

## 2015-12-19 ENCOUNTER — Encounter: Payer: Self-pay | Admitting: Emergency Medicine

## 2015-12-19 ENCOUNTER — Emergency Department: Payer: Medicare Other

## 2015-12-19 ENCOUNTER — Other Ambulatory Visit: Payer: Self-pay

## 2015-12-19 DIAGNOSIS — W1839XA Other fall on same level, initial encounter: Secondary | ICD-10-CM | POA: Diagnosis not present

## 2015-12-19 DIAGNOSIS — Y9222 Religious institution as the place of occurrence of the external cause: Secondary | ICD-10-CM | POA: Insufficient documentation

## 2015-12-19 DIAGNOSIS — S99921A Unspecified injury of right foot, initial encounter: Secondary | ICD-10-CM | POA: Diagnosis present

## 2015-12-19 DIAGNOSIS — R0789 Other chest pain: Secondary | ICD-10-CM | POA: Diagnosis not present

## 2015-12-19 DIAGNOSIS — Z791 Long term (current) use of non-steroidal anti-inflammatories (NSAID): Secondary | ICD-10-CM | POA: Diagnosis not present

## 2015-12-19 DIAGNOSIS — Y9301 Activity, walking, marching and hiking: Secondary | ICD-10-CM | POA: Insufficient documentation

## 2015-12-19 DIAGNOSIS — S92351A Displaced fracture of fifth metatarsal bone, right foot, initial encounter for closed fracture: Secondary | ICD-10-CM | POA: Diagnosis not present

## 2015-12-19 DIAGNOSIS — Z79899 Other long term (current) drug therapy: Secondary | ICD-10-CM | POA: Insufficient documentation

## 2015-12-19 DIAGNOSIS — Y999 Unspecified external cause status: Secondary | ICD-10-CM | POA: Diagnosis not present

## 2015-12-19 DIAGNOSIS — S92901A Unspecified fracture of right foot, initial encounter for closed fracture: Secondary | ICD-10-CM

## 2015-12-19 DIAGNOSIS — J45909 Unspecified asthma, uncomplicated: Secondary | ICD-10-CM | POA: Diagnosis not present

## 2015-12-19 LAB — CBC WITH DIFFERENTIAL/PLATELET
BASOS PCT: 0 %
Basophils Absolute: 0 10*3/uL (ref 0–0.1)
EOS ABS: 0.3 10*3/uL (ref 0–0.7)
EOS PCT: 5 %
HCT: 38.2 % (ref 35.0–47.0)
Hemoglobin: 12.9 g/dL (ref 12.0–16.0)
Lymphocytes Relative: 36 %
Lymphs Abs: 2.4 10*3/uL (ref 1.0–3.6)
MCH: 31.8 pg (ref 26.0–34.0)
MCHC: 33.6 g/dL (ref 32.0–36.0)
MCV: 94.7 fL (ref 80.0–100.0)
MONOS PCT: 9 %
Monocytes Absolute: 0.6 10*3/uL (ref 0.2–0.9)
Neutro Abs: 3.4 10*3/uL (ref 1.4–6.5)
Neutrophils Relative %: 50 %
PLATELETS: 194 10*3/uL (ref 150–440)
RBC: 4.04 MIL/uL (ref 3.80–5.20)
RDW: 13.9 % (ref 11.5–14.5)
WBC: 6.7 10*3/uL (ref 3.6–11.0)

## 2015-12-19 LAB — BASIC METABOLIC PANEL
Anion gap: 7 (ref 5–15)
BUN: 7 mg/dL (ref 6–20)
CALCIUM: 9 mg/dL (ref 8.9–10.3)
CHLORIDE: 106 mmol/L (ref 101–111)
CO2: 28 mmol/L (ref 22–32)
CREATININE: 0.81 mg/dL (ref 0.44–1.00)
Glucose, Bld: 133 mg/dL — ABNORMAL HIGH (ref 65–99)
Potassium: 3.8 mmol/L (ref 3.5–5.1)
SODIUM: 141 mmol/L (ref 135–145)

## 2015-12-19 LAB — TROPONIN I

## 2015-12-19 MED ORDER — TRAMADOL HCL 50 MG PO TABS: 50.0000 mg | ORAL_TABLET | Freq: Four times a day (QID) | ORAL | 0 refills | Status: DC | PRN

## 2015-12-19 MED ORDER — IBUPROFEN 600 MG PO TABS
600.0000 mg | ORAL_TABLET | Freq: Once | ORAL | Status: AC
  Administered 2015-12-19: 600 mg via ORAL
  Filled 2015-12-19: qty 1

## 2015-12-19 NOTE — Discharge Instructions (Signed)
Please seek medical attention for any high fevers, chest pain, shortness of breath, change in behavior, persistent vomiting, bloody stool or any other new or concerning symptoms.  

## 2015-12-19 NOTE — ED Provider Notes (Signed)
[facility] Emergency Department Provider Note    ____________________________________________   I have reviewed the triage vital signs and the nursing notes.   HISTORY  Chief Complaint Foot Injury and Loss of Consciousness   History limited by: Not Limited   HPI April [name] is a 45 y.o. female who presents to the emergency department today with primary concerns for right foot pain after a fall. The patient states that she was leaving church when she fell. She is not exactly sure why she fell. She does remember however pain down on the ground. She does not think she hit her head. She did feel immediate pain in her right outer foot. He also experienced some pain in her left chest where she thinks she fell on her arm. Patient denies any recent illness. No chest pain or shortness of breath related to the incident.    Past Medical History:  Diagnosis Date  . Anxiety 12/14/2011  . Asthma    no inhaler  . Factor 5 Leiden mutation, heterozygous ([place])   . Fibromyalgia   . Fracture of 5th metatarsal 03/2011   left foot -    . Heart palpitations   . Interstitial cystitis   . Ovarian cyst   . Pancreatitis   . Pelvic floor dysfunction   . Rheumatoid arteritis   . Sinus infection    chronic  . Thrombosis 12/14/2011   Thrombosis of R gonadal vein with extension of thrombus into IVC to level of R renal vein per CT 12/11/12 of [place].    Patient Active Problem List   Diagnosis Date Noted  . Varicose veins of leg with pain, bilateral 12/17/2015  . Heart palpitations   . Sensory disturbance 12/16/2011  . Interstitial cystitis 12/15/2011  . Hematuria 12/15/2011  . Acute DVT (deep venous thrombosis)-right gonadal 12/14/2011  . SOB (shortness of breath) on exertion 12/14/2011  . Factor 5 Leiden mutation, heterozygous ([place]) 12/14/2011  . Anxiety 12/14/2011  . Fibromyalgia 12/14/2011  . Thrombosis 12/14/2011  . ABDOMINAL PAIN OTHER SPECIFIED SITE 03/14/2010     Past Surgical History:  Procedure Laterality Date  . ABDOMINAL HYSTERECTOMY    . CHOLECYSTECTOMY    . CYSTOSCOPY WITH HYDRODISTENSION AND BIOPSY  04/21/2011   Dr Philis Fendt  . LAPAROSCOPIC ASSISTED VAGINAL HYSTERECTOMY  08/21/2011   Procedure: LAPAROSCOPIC ASSISTED VAGINAL HYSTERECTOMY;  Surgeon: Cyril Mourning, MD;  Location: [facility];  Service: Gynecology;  Laterality: N/A;  OPEN LAPAROSCOPIC  . LAPAROSCOPIC NISSEN FUNDOPLICATION  [id number]  . SALPINGOOPHORECTOMY  08/21/2011   Procedure: SALPINGO OOPHERECTOMY;  Surgeon: Cyril Mourning, MD;  Location: [facility];  Service: Gynecology;  Laterality: Bilateral;  . TUBAL LIGATION  2010  . WISDOM TOOTH EXTRACTION      Prior to Admission medications   Medication Sig Start Date End Date Taking? Authorizing Provider  albuterol (PROVENTIL HFA;VENTOLIN HFA) 108 (90 BASE) MCG/ACT inhaler Inhale 1 puff into the lungs as needed. For shortness of breath or wheezing    Historical Provider, MD  budesonide-formoterol (SYMBICORT) 80-4.5 MCG/ACT inhaler Inhale 2 puffs into the lungs 2 (two) times daily.    Historical Provider, MD  Calcium-Phosphorus-Vitamin D (CALCIUM GUMMIES PO) Take 1 tablet by mouth every evening.    Historical Provider, MD  clobetasol ointment (TEMOVATE) [id number] % Apply 1 application topically daily as needed. For pain/irriation    Historical Provider, MD  Cyanocobalamin 5000 MCG SUBL Place 5,000 mcg under the tongue daily.     Historical Provider, MD  Evening  Primrose Oil 1000 MG CAPS Take 1,000 mg by mouth every evening.     Historical Provider, MD  Fexofenadine HCl (ALLEGRA PO) Take 1 tablet by mouth daily as needed. For allergies/sinues    Historical Provider, MD  fluconazole (DIFLUCAN) 150 MG tablet Take 150 mg by mouth every 7 (seven) days. Take on Mondays    Historical Provider, MD  fluticasone (FLONASE) 50 MCG/ACT nasal spray Place 1 spray into the nose daily as needed. For stuffy nose    Historical Provider, MD  gabapentin  (NEURONTIN) 300 MG capsule Take 300 mg by mouth 3 (three) times daily.    Historical Provider, MD  guaifenesin (HUMIBID E) 400 MG TABS Take 400 mg by mouth 2 (two) times daily as needed. For congestion    Historical Provider, MD  hydrochlorothiazide (HYDRODIURIL) 25 MG tablet 1 tablet on M-W-F (3 X per week) 07/15/12   Josue Hector, MD  HYDROcodone-acetaminophen [facility]) 10-325 MG per tablet Take 1 tablet by mouth every 6 (six) hours as needed for pain. For pain    Historical Provider, MD  hydrOXYzine (VISTARIL) 25 MG capsule Take 50 mg by mouth at bedtime.     Historical Provider, MD  metoprolol tartrate (LOPRESSOR) 25 MG tablet Take 1 tablet (25 mg total) by mouth 2 (two) times daily. 12/09/15 03/08/16  Bhavinkumar Bhagat, PA  Milnacipran (SAVELLA) 50 MG TABS Take 50 mg by mouth 2 (two) times daily.    Historical Provider, MD  montelukast (SINGULAIR) 10 MG tablet Take 10 mg by mouth at bedtime.    Historical Provider, MD  Multiple Vitamin (MULITIVITAMIN WITH MINERALS) TABS Take 1 tablet by mouth every evening.     Historical Provider, MD  pentosan polysulfate (ELMIRON) 100 MG capsule Take 200 mg by mouth 2 (two) times daily before a meal.     Historical Provider, MD  PRESCRIPTION MEDICATION Place 10 mg vaginally every evening. Diazepam suppository compounded by [place] [facility] Provider, MD  pseudoephedrine (SUDAFED) 30 MG tablet Take 60 mg by mouth 4 (four) times daily.    Historical Provider, MD  Simethicone 125 MG CAPS Take 125 mg by mouth 4 (four) times daily.    Historical Provider, MD  traMADol (ULTRAM) 50 MG tablet Take by mouth. Take 50 mg by mouth every 6 (six) hours. For pain    Historical Provider, MD  valACYclovir (VALTREX) 500 MG tablet Take 500 mg by mouth at bedtime.     Historical Provider, MD    Allergies Amitriptyline; Bromelains [pineapple extract]; Celexa [citalopram hydrobromide]; Citalopram; Methylene blue; Sertraline; Sulfonamide derivatives; Bromaline  [albertsons di bromm]; Other; and Penicillins  Family History  Problem Relation Age of Onset  . Anesthesia problems Neg Hx     Social History Social History  Substance Use Topics  . Smoking status: Never Smoker  . Smokeless tobacco: Never Used  . Alcohol use No    Review of Systems  Constitutional: Negative for fever. Cardiovascular: Negative for chest pain. Respiratory: Negative for shortness of breath. Gastrointestinal: Negative for abdominal pain, vomiting and diarrhea. Musculoskeletal: Negative for back pain. Positive for right foot pain. Positive for left chest wall pain. Skin: Negative for rash. Neurological: Negative for headaches, focal weakness or numbness.  10-point ROS otherwise negative.  ____________________________________________   PHYSICAL EXAM:  VITAL SIGNS: ED Triage Vitals  Enc Vitals Group     BP 12/19/15 1441 115/75     Pulse Rate 12/19/15 1441 97     Resp 12/19/15 1441 18  Temp 12/19/15 1441 98.5 F (36.9 C)     Temp Source 12/19/15 1441 Oral     SpO2 12/19/15 1441 97 %     Weight 12/19/15 1446 194 lb (88 kg)     Height 12/19/15 1446 5' (1.524 m)     Head Circumference --      Peak Flow --      Pain Score 12/19/15 1446 7   Constitutional: Alert and oriented. Well appearing and in no distress. Eyes: Conjunctivae are normal. Normal extraocular movements. ENT   Head: Normocephalic and atraumatic.   Nose: No congestion/rhinnorhea.   Mouth/Throat: Mucous membranes are moist.   Neck: No stridor. Hematological/Lymphatic/Immunilogical: No cervical lymphadenopathy. Cardiovascular: Normal rate, regular rhythm.  No murmurs, rubs, or gallops. Respiratory: Normal respiratory effort without tachypnea nor retractions. Breath sounds are clear and equal bilaterally. No wheezes/rales/rhonchi. Gastrointestinal: Soft and nontender. No distention.  Genitourinary: Deferred Musculoskeletal: Normal range of motion in all extremities. No lower  extremity edema. There is some swelling and tenderness to the right lateral foot. No tenting of the skin. No skin break. Neurologic:  Normal speech and language. No gross focal neurologic deficits are appreciated.  Skin:  Skin is warm, dry and intact. No rash noted. Psychiatric: Mood and affect are normal. Speech and behavior are normal. Patient exhibits appropriate insight and judgment.  ____________________________________________    LABS (pertinent positives/negatives)  Labs Reviewed  BASIC METABOLIC PANEL - Abnormal; Notable for the following:       Result Value   Glucose, Bld 133 (*)    All other components within normal limits  CBC WITH DIFFERENTIAL/PLATELET  TROPONIN I     ____________________________________________   EKG  I, Nance Pear, attending physician, personally viewed and interpreted this EKG  EKG Time: 1456 Rate: 106 Rhythm: sinus tachycardia Axis: normal Intervals: qtc 428 QRS: narrow ST changes: no st elevation Impression: abnormal ekg   ____________________________________________    RADIOLOGY  CXR IMPRESSION:  No active cardiopulmonary disease.    Right foot x-ray IMPRESSION:  Mildly displaced fracture through the base of the fifth metatarsal.    I, Mignon Bechler, personally viewed and evaluated these images (plain radiographs) as part of my medical decision making. ____________________________________________   PROCEDURES  Procedures  ____________________________________________   INITIAL IMPRESSION / ASSESSMENT AND PLAN / ED COURSE  Pertinent labs & imaging results that were available during my care of the patient were reviewed by me and considered in my medical decision making (see chart for details).  Patient presents to the emergency department today with concerns for right foot pain after a fall. On exam patient does have some tenderness to the outside aspect of the right foot. Concern for right foot fracture, will  obtain x-ray. Additionally patient complaining of some chest pain. Will obtain x-ray to evaluate for any left rib pathology. Furthermore given patient's*will check his blood work.  Clinical Course    Patient's blood work without concerning findings. Patient is not anemic. Patient's x-rays do show a right fifth metatarsal fracture. Patient was placed in a posterior splint. I did evaluate the patient after the posterior splint was placed. Good cap refill in all toes. At this point think patient is safe for discharge home. Doubt ACS as cause of syncope. Doubt PE given lack of hypoxia, shortness of breath. Doubt intracranial pathology given lack of headache. ____________________________________________   FINAL CLINICAL IMPRESSION(S) / ED DIAGNOSES  Final diagnoses:  Closed fracture of right foot, initial encounter     Note: This dictation  was prepared with Dragon dictation. Any transcriptional errors that result from this process are unintentional    Nance Pear, MD 12/19/15 1715

## 2015-12-19 NOTE — ED Triage Notes (Signed)
Pt fell walking out of church.  Reports lost balance in church twice.  Remembers walking out of church but does not remember falling this time. Just reports woke up on ground. C/o foot pain to right foot.  Had surgery to right leg Friday. Difficult to get story because when ask question pt goes on a tangent but does not directly answer question. C/o head hurting at this time.

## 2015-12-19 NOTE — ED Notes (Signed)
Hooked pt up to monitor and gave blanket.

## 2015-12-20 ENCOUNTER — Telehealth (INDEPENDENT_AMBULATORY_CARE_PROVIDER_SITE_OTHER): Payer: Self-pay

## 2015-12-20 NOTE — Telephone Encounter (Signed)
Patient called stating she broke her foot and its red and she was wondering if she has a blood clot. She had laser ablation on 12/17/15 and was concerned that having the laser would cause her broken foot to have DVT. She has an appointment with her podiatrist today and is being seen tomorrow for U/S in our office.

## 2015-12-21 ENCOUNTER — Ambulatory Visit (INDEPENDENT_AMBULATORY_CARE_PROVIDER_SITE_OTHER): Payer: Medicare Other

## 2015-12-21 DIAGNOSIS — I8312 Varicose veins of left lower extremity with inflammation: Secondary | ICD-10-CM | POA: Diagnosis not present

## 2015-12-21 DIAGNOSIS — I8311 Varicose veins of right lower extremity with inflammation: Secondary | ICD-10-CM

## 2016-01-06 ENCOUNTER — Telehealth: Payer: Self-pay | Admitting: *Deleted

## 2016-01-06 NOTE — Telephone Encounter (Signed)
NOTES FAX TO [place] OFFICE AT 2:07 PM

## 2016-01-07 ENCOUNTER — Ambulatory Visit (INDEPENDENT_AMBULATORY_CARE_PROVIDER_SITE_OTHER): Payer: Medicare Other | Admitting: Vascular Surgery

## 2016-01-07 ENCOUNTER — Encounter (INDEPENDENT_AMBULATORY_CARE_PROVIDER_SITE_OTHER): Payer: Self-pay | Admitting: Vascular Surgery

## 2016-01-07 VITALS — BP 122/76 | HR 82 | Resp 15 | Ht 65.0 in | Wt 191.0 lb

## 2016-01-07 DIAGNOSIS — I83812 Varicose veins of left lower extremities with pain: Secondary | ICD-10-CM

## 2016-01-07 DIAGNOSIS — I83813 Varicose veins of bilateral lower extremities with pain: Secondary | ICD-10-CM

## 2016-01-07 NOTE — Assessment & Plan Note (Signed)
See below

## 2016-01-07 NOTE — Progress Notes (Signed)
The patient's left lower extremity was sterilely prepped and draped. The ultrasound machine was used to visualize the saphenous vein throughout its course. A segment at the level of the knee was selected for access. The saphenous vein was accessed without difficulty using ultrasound guidance with a micro puncture needle. A micro puncture wire and sheath were then placed. A 0.018 wire was placed beyond the saphenofemoral junction through the sheath and the micro puncture sheath was removed. The 65 cm sheath was then placed over the wire and the wire and dilator were removed. The laser fiber was placed through the sheath and its tip was placed approximately 4 cm below the saphenofemoral junction. Tumescent anesthesia was then created with a dilute lidocaine solution. Laser energy was then delivered with constant withdrawal of the sheath and laser fiber. Approximately 1537 Joules of energy were delivered over a length of 33 cm using the 1470 Hz VenaCure machine at [employer]. Sterile dressings were placed. The patient tolerated the procedure well without complications.

## 2016-01-10 ENCOUNTER — Other Ambulatory Visit (INDEPENDENT_AMBULATORY_CARE_PROVIDER_SITE_OTHER): Payer: Self-pay | Admitting: Vascular Surgery

## 2016-01-10 ENCOUNTER — Encounter (INDEPENDENT_AMBULATORY_CARE_PROVIDER_SITE_OTHER): Payer: Self-pay

## 2016-01-10 DIAGNOSIS — I83893 Varicose veins of bilateral lower extremities with other complications: Secondary | ICD-10-CM

## 2016-01-12 ENCOUNTER — Ambulatory Visit (INDEPENDENT_AMBULATORY_CARE_PROVIDER_SITE_OTHER): Payer: Medicare Other

## 2016-01-12 ENCOUNTER — Ambulatory Visit: Payer: Medicare Other | Admitting: Internal Medicine

## 2016-01-12 DIAGNOSIS — I83893 Varicose veins of bilateral lower extremities with other complications: Secondary | ICD-10-CM | POA: Diagnosis not present

## 2016-01-13 ENCOUNTER — Other Ambulatory Visit (HOSPITAL_COMMUNITY): Payer: Self-pay | Admitting: Obstetrics and Gynecology

## 2016-01-13 DIAGNOSIS — K589 Irritable bowel syndrome without diarrhea: Secondary | ICD-10-CM

## 2016-01-14 ENCOUNTER — Other Ambulatory Visit (HOSPITAL_COMMUNITY): Payer: Medicare Other

## 2016-01-18 ENCOUNTER — Ambulatory Visit (HOSPITAL_COMMUNITY): Payer: Medicare Other

## 2016-01-18 ENCOUNTER — Ambulatory Visit (HOSPITAL_COMMUNITY): Admission: RE | Admit: 2016-01-18 | Payer: Medicare Other | Source: Ambulatory Visit

## 2016-02-04 ENCOUNTER — Encounter: Payer: Self-pay | Admitting: Internal Medicine

## 2016-02-04 ENCOUNTER — Ambulatory Visit (INDEPENDENT_AMBULATORY_CARE_PROVIDER_SITE_OTHER): Payer: Medicare Other | Admitting: Internal Medicine

## 2016-02-04 ENCOUNTER — Ambulatory Visit (INDEPENDENT_AMBULATORY_CARE_PROVIDER_SITE_OTHER): Payer: Medicare Other | Admitting: Vascular Surgery

## 2016-02-04 VITALS — BP 106/68 | HR 77 | Ht 65.0 in | Wt 198.5 lb

## 2016-02-04 DIAGNOSIS — I519 Heart disease, unspecified: Secondary | ICD-10-CM

## 2016-02-04 DIAGNOSIS — R000 Tachycardia, unspecified: Secondary | ICD-10-CM | POA: Diagnosis not present

## 2016-02-04 DIAGNOSIS — R0789 Other chest pain: Secondary | ICD-10-CM | POA: Diagnosis not present

## 2016-02-04 DIAGNOSIS — I5189 Other ill-defined heart diseases: Secondary | ICD-10-CM

## 2016-02-04 NOTE — Progress Notes (Signed)
Follow-up Outpatient Visit Date: 02/04/2016  Chief Complaint: Follow-up chest pain  HPI:  April [name] is a 46 y.o. 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 Bynum Bellows @ [place]) who was on Xarelto for this (discontinued due to severe anemia), fibromyalgia, asthma and rheumatoid arthritis , who presents for follow-up of chest pain and shortness of breath. The patient was seen 2014 by Dr. Johnsie Cancel for evaluation of shortness of breath and more recently in 12/2015 by Leanor Kail, [place] in our office. At that time, she had experienced several episodes of atypical chest pain and was referred for myocardial perfusion stress test that showed no evidence of ischemia or scar. Preceding outside echo in September had shown moderate diastolic dysfunction and mild mitral/tricuspid regurgitation. At the visit in October, the patient was started on metoprolol tartrate 25 mg twice a day. Since that time, the patient reports that she has not had any more chest pain reminiscent of her ED visit in November. At the time, she was also fighting an infection from recent right thigh lipoma excision, and April [name] believes that may have contributed to her chest discomfort. She has had some intermittent migratory sharp chest wall pains and tenderness that she attributes to fibromyalgia. She is also had some chest wall discomfort after a fall last month. Unfortunate, she also fractured her right foot during this fall. She notes occasional palpitations but overall has experienced a marked improvement in her palpitations and resting heart rate with metoprolol. Shortness of breath is at her baseline, though she continues to recover from a bout of bronchitis.  --------------------------------------------------------------------------------------------------  Cardiovascular History & Procedures: Cardiovascular Problems:  Atypical chest  pain  Diastolic dysfunction  Factor V Leiden mutation with history of gonadal vein thrombus extending into IVC  Risk Factors:  Hypercoagulable state  Cath/PCI:  None  CV Surgery:  Lower extremity venous ablation (12/2015, Dr. Lucky Cowboy)  EP Procedures and Devices:  None  Non-Invasive Evaluation(s):  Exercise myocardial perfusion stress test (12/16/15): Normal study. No evidence of ischemia or scar. Patient exercised 6 minutes achieving 7 METs with peak heart rate of 151 bpm (85% MPHR).  CTA chest (12/06/15): No pulmonary embolism. No significant coronary artery calcification. Mosaic attenuation pattern in both lungs that may reflect small airway disease.  Transthoracic echocardiogram (10/06/15, [place]): Normal LV function with moderate diastolic dysfunction. Mild mitral and tricuspid regurgitation.  Recent CV Pertinent Labs: Lab Results  Component Value Date   INR 1.13 12/15/2011   K 3.8 12/19/2015   K 3.3 (L) 07/15/2013   MG 2.1 12/15/2011   BUN 7 12/19/2015   BUN 7 07/15/2013   CREATININE 0.81 12/19/2015   CREATININE 0.96 07/15/2013    Past medical and surgical history were reviewed and updated in EPIC.   Outpatient Encounter Prescriptions as of 02/04/2016  Medication Sig  . albuterol (PROVENTIL HFA;VENTOLIN HFA) 108 (90 BASE) MCG/ACT inhaler Inhale 1 puff into the lungs as needed. For shortness of breath or wheezing  . baclofen (LIORESAL) 20 MG tablet Take 20 mg by mouth 3 (three) times daily.  . butalbital-acetaminophen-caffeine (FIORICET WITH CODEINE) 50-325-40-30 MG capsule Take 1 capsule by mouth every 4 (four) hours as needed for headache.  . Calcium-Phosphorus-Vitamin D (CALCIUM GUMMIES PO) Take 1 tablet by mouth every evening.  . Cyanocobalamin 5000 MCG SUBL Place 5,000 mcg under the tongue daily.   . diazepam (VALIUM) 10 MG tablet Take  10 mg by mouth daily.  . fentaNYL (DURAGESIC - DOSED MCG/HR) 50 MCG/HR Place 50 mcg onto the skin every 3  (three) days.  [name] Fexofenadine HCl (ALLEGRA PO) Take 1 tablet by mouth daily as needed. For allergies/sinues  . fluconazole (DIFLUCAN) 150 MG tablet Take 150 mg by mouth every 7 (seven) days. Take on Mondays  . Fluticasone-Salmeterol (ADVAIR) 250-50 MCG/DOSE AEPB Inhale 1 puff into the lungs as needed.  . furosemide (LASIX) 20 MG tablet Take 20 mg by mouth daily.  [name] gabapentin (NEURONTIN) 300 MG capsule Take 300 mg by mouth 3 (three) times daily.  [name] guaifenesin (HUMIBID E) 400 MG TABS Take 400 mg by mouth 2 (two) times daily as needed. For congestion  . HYDROcodone-acetaminophen (NORCO) 10-325 MG per tablet Take 1 tablet by mouth every 6 (six) hours as needed for pain. For pain  . hydrOXYzine (VISTARIL) 25 MG capsule Take 50 mg by mouth at bedtime.   [name] Hyoscyamine Sulfate (HYOSCYAMINE PO) Take by mouth as needed.  . metoprolol tartrate (LOPRESSOR) 25 MG tablet Take 1 tablet (25 mg total) by mouth 2 (two) times daily.  . Milnacipran (SAVELLA) 50 MG TABS Take 50 mg by mouth 2 (two) times daily.  . montelukast (SINGULAIR) 10 MG tablet Take 10 mg by mouth at bedtime.  . Multiple Vitamin (MULITIVITAMIN WITH MINERALS) TABS Take 1 tablet by mouth every evening.   . pantoprazole (PROTONIX) 40 MG tablet Take 40 mg by mouth daily.  . pentosan polysulfate (ELMIRON) 100 MG capsule Take 200 mg by mouth 2 (two) times daily before a meal.   . pseudoephedrine (SUDAFED) 30 MG tablet Take 60 mg by mouth 4 (four) times daily.  . Simethicone 125 MG CAPS Take 125 mg by mouth 4 (four) times daily.  . traMADol (ULTRAM) 50 MG tablet Take 1 tablet (50 mg total) by mouth every 6 (six) hours as needed.  . valACYclovir (VALTREX) 500 MG tablet Take 500 mg by mouth at bedtime.   . [DISCONTINUED] budesonide-formoterol (SYMBICORT) 80-4.5 MCG/ACT inhaler Inhale 2 puffs into the lungs 2 (two) times daily.  . [DISCONTINUED] clobetasol ointment (TEMOVATE) [id number] % Apply 1 application topically daily as needed. For pain/irriation  .  [DISCONTINUED] Evening Primrose Oil 1000 MG CAPS Take 1,000 mg by mouth every evening.   . [DISCONTINUED] fluticasone (FLONASE) 50 MCG/ACT nasal spray Place 1 spray into the nose daily as needed. For stuffy nose  . [DISCONTINUED] hydrochlorothiazide (HYDRODIURIL) 25 MG tablet 1 tablet on M-W-F (3 X per week)  . [DISCONTINUED] PRESCRIPTION MEDICATION Place 10 mg vaginally every evening. Diazepam suppository compounded by [facility]  . [DISCONTINUED] traMADol (ULTRAM) 50 MG tablet Take by mouth. Take 50 mg by mouth every 6 (six) hours. For pain   No facility-administered encounter medications on file as of 02/04/2016.     Allergies: Amitriptyline; Bromelains [pineapple extract]; Celexa [citalopram hydrobromide]; Citalopram; Methylene blue; Sertraline; Sulfonamide derivatives; Bromaline [albertsons di bromm]; Other; and Penicillins  Social History   Social History  . Marital status: Married    Spouse name: N/A  . Number of children: N/A  . Years of education: N/A   Occupational History  . Not on file.   Social History Main Topics  . Smoking status: Never Smoker  . Smokeless tobacco: Never Used  . Alcohol use No  . Drug use: No  . Sexual activity: Yes    Birth control/ protection: Surgical   Other Topics Concern  . Not on file   Social History  Narrative  . No narrative on file    Family History  Problem Relation Age of Onset  . Heart Problems Mother   . Vascular Disease Mother   . Vascular Disease Maternal Grandmother   . Anesthesia problems Neg Hx     Review of Systems: A 12-system review of systems was performed and was negative except as noted in the HPI.  --------------------------------------------------------------------------------------------------  Physical Exam: BP 106/68 (BP Location: Right Arm, Patient Position: Sitting, Cuff Size: Large)   Pulse 77   Ht 5\' 5"  (1.651 m)   Wt 90 kg (198 lb 8 oz)   LMP 05/20/2011   BMI 33.03 kg/m   General:   Obese woman, seated comfortably in the exam room. HEENT: No conjunctival pallor or scleral icterus.  Moist mucous membranes.  OP clear. Neck: Supple without lymphadenopathy, thyromegaly, JVD, or HJR.  No carotid bruit. Lungs: Normal work of breathing.  Clear to auscultation bilaterally without wheezes or crackles. Heart: Regular rate and rhythm without murmurs, rubs, or gallops.  Non-displaced PMI. Abd: Bowel sounds present.  Soft, NT/ND without hepatosplenomegaly Ext: 2+ radial pulses bilaterally. 2+ left pedal pulses. Right pedal pulses not examined, as a rigid boot is in place. No pretibial edema bilaterally. Skin: warm and dry without rash  EKG:  Normal sinus rhythm with short PR interval. Otherwise, no significant abnormalities or changes from prior tracing (I personally reviewed both tracings).  Lab Results  Component Value Date   WBC 6.7 12/19/2015   HGB 12.9 12/19/2015   HCT 38.2 12/19/2015   MCV 94.7 12/19/2015   PLT 194 12/19/2015    Lab Results  Component Value Date   NA 141 12/19/2015   K 3.8 12/19/2015   CL 106 12/19/2015   CO2 28 12/19/2015   BUN 7 12/19/2015   CREATININE 0.81 12/19/2015   GLUCOSE 133 (H) 12/19/2015   ALT 16 12/15/2011   --------------------------------------------------------------------------------------------------  ASSESSMENT AND PLAN: Atypical chest pain: Overall, symptoms have improved with metoprolol. She continues to have some chest pain which is noncardiac in character. Given her normal stress test and lack of coronary calcium on prior chest CT (I have personally reviewed the images), I have very low suspicion for significant epicardial CAD. We will continue with low-dose metoprolol and risk factor modification. The patient should continue with her PCP for management of fibromyalgia and likely component of musculoskeletal chest pain.  Rapid heart beat: Suspect this is multifactorial. EKG is unremarkable with exception of a short PR  interval but no evidence of delta wave. We will continue with metoprolol, as the patient has experienced a significant improvement since this was added at her last visit.  Diastolic dysfunction: The patient appears euvolemic and well compensated today. We will continue with her current doses of metoprolol and furosemide.  Follow-up: Return to clinic in one year.  [name], MD 02/04/2016 12:40 PM

## 2016-02-04 NOTE — Patient Instructions (Signed)
Medication Instructions:  Your physician recommends that you continue on your current medications as directed. Please refer to the Current Medication list given to you today.   Labwork: - None ordered.   Testing/Procedures: - None ordered.   Follow-Up: Your physician wants you to follow-up in: 12 MONTHS WITH DR END. You will receive a reminder letter in the mail two months in advance. If you don't receive a letter, please call our office to schedule the follow-up appointment.

## 2016-02-15 ENCOUNTER — Encounter (INDEPENDENT_AMBULATORY_CARE_PROVIDER_SITE_OTHER): Payer: Self-pay | Admitting: Vascular Surgery

## 2016-02-15 ENCOUNTER — Ambulatory Visit (INDEPENDENT_AMBULATORY_CARE_PROVIDER_SITE_OTHER): Payer: Medicare Other | Admitting: Vascular Surgery

## 2016-02-15 VITALS — BP 118/79 | HR 93 | Resp 16 | Wt 195.0 lb

## 2016-02-15 DIAGNOSIS — I83813 Varicose veins of bilateral lower extremities with pain: Secondary | ICD-10-CM

## 2016-02-15 DIAGNOSIS — M797 Fibromyalgia: Secondary | ICD-10-CM | POA: Diagnosis not present

## 2016-02-15 DIAGNOSIS — R002 Palpitations: Secondary | ICD-10-CM | POA: Diagnosis not present

## 2016-02-15 NOTE — Assessment & Plan Note (Signed)
Recently saw cardiology. On metoprolol.

## 2016-02-15 NOTE — Assessment & Plan Note (Signed)
Recommend:  The patient has had successful ablation of the previously incompetent saphenous venous system but still has persistent symptoms of pain and swelling that are having a negative impact on daily life and daily activities.  Patient should undergo injection sclerotherapy to treat the residual varicosities.  The risks, benefits and alternative therapies were reviewed in detail with the patient.  All questions were answered.  The patient agrees to proceed with sclerotherapy at their convenience.  The patient will continue wearing the graduated compression stockings and using the over-the-counter pain medications to treat her symptoms.     

## 2016-02-15 NOTE — Assessment & Plan Note (Signed)
Definitely contributes to her lower extremity symptoms.

## 2016-02-15 NOTE — Progress Notes (Signed)
MRN : [id number]  April [name] is a 46 y.o. (30-Aug-1970) female who presents with chief complaint of  Chief Complaint  Patient presents with  . Follow-up  .  History of Present Illness: Patient returns in follow up for Venous insufficiency. She is status post bilateral great saphenous vein laser ablations. She is still bothered by painful varicosities bilaterally. These are little more prominent on the left leg than the right leg. She reports no ulceration or infection.    Past Medical History:  Diagnosis Date  . Anxiety 12/14/2011  . Asthma    no inhaler  . CHF (congestive heart failure) ([place])   . Factor 5 Leiden mutation, heterozygous ([place])   . Fibromyalgia   . Fracture of 5th metatarsal 03/2011   left foot -    . Heart palpitations   . Interstitial cystitis   . Ovarian cyst   . Pancreatitis   . Pelvic floor dysfunction   . Rheumatoid arteritis   . Sinus infection    chronic  . 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    Past Surgical History:  Procedure Laterality Date  . ABDOMINAL HYSTERECTOMY    . CHOLECYSTECTOMY    . CYSTOSCOPY WITH HYDRODISTENSION AND BIOPSY  04/21/2011   Dr Philis Fendt  . LAPAROSCOPIC ASSISTED VAGINAL HYSTERECTOMY  08/21/2011   Procedure: LAPAROSCOPIC ASSISTED VAGINAL HYSTERECTOMY;  Surgeon: Cyril Mourning, MD;  Location: [facility];  Service: Gynecology;  Laterality: N/A;  OPEN LAPAROSCOPIC  . LAPAROSCOPIC NISSEN FUNDOPLICATION  5400  . SALPINGOOPHORECTOMY  08/21/2011   Procedure: SALPINGO OOPHERECTOMY;  Surgeon: Cyril Mourning, MD;  Location: [facility];  Service: Gynecology;  Laterality: Bilateral;  . TUBAL LIGATION  2010  . WISDOM TOOTH EXTRACTION      Social History Social History  Substance Use Topics  . Smoking status: Never Smoker  . Smokeless tobacco: Never Used  . Alcohol use No    Family History Family History  Problem  Relation Age of Onset  . Heart Problems Mother   . Vascular Disease Mother   . Vascular Disease Maternal Grandmother   . Anesthesia problems Neg Hx     Current Outpatient Prescriptions  Medication Sig Dispense Refill  . albuterol (PROVENTIL HFA;VENTOLIN HFA) 108 (90 BASE) MCG/ACT inhaler Inhale 1 puff into the lungs as needed. For shortness of breath or wheezing    . baclofen (LIORESAL) 20 MG tablet Take 20 mg by mouth 3 (three) times daily.    . butalbital-acetaminophen-caffeine (FIORICET WITH CODEINE) 50-325-40-30 MG capsule Take 1 capsule by mouth every 4 (four) hours as needed for headache.    . Calcium-Phosphorus-Vitamin D (CALCIUM GUMMIES PO) Take 1 tablet by mouth every evening.    . Cyanocobalamin 5000 MCG SUBL Place 5,000 mcg under the tongue daily.     . diazepam (VALIUM) 10 MG tablet Take 10 mg by mouth daily.    . fentaNYL (DURAGESIC - DOSED MCG/HR) 50 MCG/HR Place 50 mcg onto the skin every 3 (three) days.    [name] Fexofenadine HCl (ALLEGRA PO) Take 1 tablet by mouth daily as needed. For allergies/sinues    . fluconazole (DIFLUCAN) 150 MG tablet Take 150 mg by mouth every 7 (seven) days. Take on Mondays    . Fluticasone-Salmeterol (ADVAIR) 250-50 MCG/DOSE AEPB Inhale 1 puff into the lungs as needed.    . furosemide (LASIX) 20 MG tablet Take  20 mg by mouth daily.    [name] gabapentin (NEURONTIN) 300 MG capsule Take 300 mg by mouth 3 (three) times daily.    [name] guaifenesin (HUMIBID E) 400 MG TABS Take 400 mg by mouth 2 (two) times daily as needed. For congestion    . HYDROcodone-acetaminophen (NORCO) 10-325 MG per tablet Take 1 tablet by mouth every 6 (six) hours as needed for pain. For pain    . hydrOXYzine (VISTARIL) 25 MG capsule Take 50 mg by mouth at bedtime.     [name] Hyoscyamine Sulfate (HYOSCYAMINE PO) Take by mouth as needed.    . metoprolol tartrate (LOPRESSOR) 25 MG tablet Take 1 tablet (25 mg total) by mouth 2 (two) times daily. 180 tablet 3  . Milnacipran (SAVELLA) 50 MG TABS Take  50 mg by mouth 2 (two) times daily.    . montelukast (SINGULAIR) 10 MG tablet Take 10 mg by mouth at bedtime.    . Multiple Vitamin (MULITIVITAMIN WITH MINERALS) TABS Take 1 tablet by mouth every evening.     . pantoprazole (PROTONIX) 40 MG tablet Take 40 mg by mouth daily.    . pentosan polysulfate (ELMIRON) 100 MG capsule Take 200 mg by mouth 2 (two) times daily before a meal.     . pseudoephedrine (SUDAFED) 30 MG tablet Take 60 mg by mouth 4 (four) times daily.    . Simethicone 125 MG CAPS Take 125 mg by mouth 4 (four) times daily.    . traMADol (ULTRAM) 50 MG tablet Take 1 tablet (50 mg total) by mouth every 6 (six) hours as needed. 20 tablet 0  . valACYclovir (VALTREX) 500 MG tablet Take 500 mg by mouth at bedtime.      No current facility-administered medications for this visit.     Allergies  Allergen Reactions  . Amitriptyline Other (See Comments)    Change in mental status  . Bromelains [Pineapple Extract] Other (See Comments)    Severe pain  . Celexa [Citalopram Hydrobromide] Other (See Comments)    Change in mental status  . Citalopram Other (See Comments)  . Methylene Blue Other (See Comments)    Caused inflamed pancreas, per pt should not use  . Sertraline Other (See Comments)    Suicidal thoughts  . Sulfonamide Derivatives Other (See Comments)    excrutiating pain, patient states it caused her to have osteoarthritis  . Bromaline [Albertsons Di Bromm] Rash  . Other Rash    Mango  . Penicillins Swelling and Rash     REVIEW OF SYSTEMS (Negative unless checked)  Constitutional: '[]'$ Weight loss  '[]'$ Fever  '[]'$ Chills Cardiac: '[]'$ Chest pain   '[]'$ Chest pressure   '[]'$ Palpitations   '[]'$ Shortness of breath when laying flat   '[]'$ Shortness of breath at rest   '[]'$ Shortness of breath with exertion. Vascular:  '[]'$ Pain in legs with walking   '[]'$ Pain in legs at rest   '[]'$ Pain in legs when laying flat   '[]'$ Claudication   '[]'$ Pain in feet when walking  '[]'$ Pain in feet at rest  '[]'$ Pain in feet when  laying flat   '[]'$ History of DVT   '[]'$ Phlebitis   '[x]'$ Swelling in legs   '[x]'$ Varicose veins   '[]'$ Non-healing ulcers Pulmonary:   '[]'$ Uses home oxygen   '[]'$ Productive cough   '[]'$ Hemoptysis   '[]'$ Wheeze  '[]'$ COPD    Neurologic:  '[]'$ Dizziness  '[]'$ Blackouts   '[]'$ Seizures   '[]'$ History of stroke   '[]'$ History of TIA  '[]'$ Aphasia   '[]'$ Temporary blindness   '[]'$ Dysphagia   '[]'$ Weakness or numbness in arms   '[]'$   Weakness or numbness in legs Musculoskeletal:  '[]'$ Arthritis   '[]'$ Joint swelling   '[x]'$ Joint pain   '[]'$ Low back pain Hematologic:  '[]'$ Easy bruising  '[]'$ Easy bleeding   '[]'$ Hypercoagulable state   '[]'$ Anemic  '[]'$ Thrombocytopenia Gastrointestinal:  '[]'$ Blood in stool   '[]'$ Vomiting blood  '[]'$ Gastroesophageal reflux/heartburn   '[]'$ Difficulty swallowing. Genitourinary:  '[]'$ Chronic kidney disease   '[]'$ Difficult urination  '[]'$ Frequent urination  '[]'$ Burning with urination   '[]'$ Blood in urine Skin:  '[]'$ Rashes   '[]'$ Ulcers   '[]'$ Wounds Psychological:  '[]'$ History of anxiety   '[]'$  History of major depression.  Physical Examination  Vitals:   02/15/16 0956  BP: 118/79  Pulse: 93  Resp: 16  Weight: 195 lb (88.5 kg)   Body mass index is 32.45 kg/m. Gen:  WD/WN, NAD,  Head: [place]/AT, No temporalis wasting. Ear/Nose/Throat: Hearing grossly intact, trachea midline Eyes: Conjunctiva clear. Sclera non-icteric Neck: Supple.  No JVD. Trachea midline Pulmonary:  Good air movement, respirations not labored, no use of accessory muscles.  Cardiac: RRR, normal S1, S2. Vascular:  Vessel Right Left  Radial Palpable Palpable                                   Gastrointestinal: soft, non-tender/non-distended. No guarding/reflex.  Musculoskeletal: M/S 5/5 throughout.  No deformity or atrophy. Diffuse varicosities bilaterally little worse on the left than the right Neurologic: Sensation grossly intact in extremities.  Symmetrical.  Speech is fluent. Psychiatric: Judgment intact, Mood & affect appropriate for pt's clinical situation. Dermatologic: No rashes or ulcers  noted.  No cellulitis or open wounds. Lymph : No Cervical, Axillary, or Inguinal lymphadenopathy.     Labs Recent Results (from the past 2160 hour(s))  Basic metabolic panel     Status: Abnormal   Collection Time: 12/06/15 12:43 PM  Result Value Ref Range   Sodium 141 135 - 145 mmol/L   Potassium 4.3 3.5 - 5.1 mmol/L   Chloride 108 101 - 111 mmol/L   CO2 26 22 - 32 mmol/L   Glucose, Bld 101 (H) 65 - 99 mg/dL   BUN 6 6 - 20 mg/dL   Creatinine, Ser 0.87 0.44 - 1.00 mg/dL   Calcium 9.5 8.9 - 10.3 mg/dL   GFR calc non Af Amer >60 >60 mL/min   GFR calc Af Amer >60 >60 mL/min    Comment: (NOTE) The eGFR has been calculated using the CKD EPI equation. This calculation has not been validated in all clinical situations. eGFR's persistently <60 mL/min signify possible Chronic Kidney Disease.    Anion gap 7 5 - 15  CBC     Status: None   Collection Time: 12/06/15 12:43 PM  Result Value Ref Range   WBC 6.3 4.0 - 10.5 K/uL   RBC 4.20 3.87 - 5.11 MIL/uL   Hemoglobin 13.0 12.0 - 15.0 g/dL   HCT 39.2 36.0 - 46.0 %   MCV 93.3 78.0 - 100.0 fL   MCH 31.0 26.0 - 34.0 pg   MCHC 33.2 30.0 - 36.0 g/dL   RDW 13.8 11.5 - 15.5 %   Platelets 244 150 - 400 K/uL  I-stat troponin, ED     Status: None   Collection Time: 12/06/15  1:03 PM  Result Value Ref Range   Troponin i, poc 0.00 0.00 - 0.08 ng/mL   Comment 3            Comment: Due to the release kinetics of cTnI, a negative  result within the first hours of the onset of symptoms does not rule out myocardial infarction with certainty. If myocardial infarction is still suspected, repeat the test at appropriate intervals.   Urinalysis, Routine w reflex microscopic (not at [facility])     Status: None   Collection Time: 12/06/15  2:50 PM  Result Value Ref Range   Color, Urine YELLOW YELLOW   APPearance CLEAR CLEAR   Specific Gravity, Urine 1.007 1.005 - 1.030   pH 6.0 5.0 - 8.0   Glucose, UA NEGATIVE NEGATIVE mg/dL   Hgb urine dipstick  NEGATIVE NEGATIVE   Bilirubin Urine NEGATIVE NEGATIVE   Ketones, ur NEGATIVE NEGATIVE mg/dL   Protein, ur NEGATIVE NEGATIVE mg/dL   Nitrite NEGATIVE NEGATIVE   Leukocytes, UA NEGATIVE NEGATIVE    Comment: MICROSCOPIC NOT DONE ON URINES WITH NEGATIVE PROTEIN, BLOOD, LEUKOCYTES, NITRITE, OR GLUCOSE <1000 mg/dL.  Myocardial Perfusion Imaging     Status: None   Collection Time: 12/16/15 10:40 AM  Result Value Ref Range   Rest HR 73 bpm   Rest BP 131/81 mmHg   Exercise duration (sec) 0 sec   Percent HR 85 %   Exercise duration (min) 6 min   Estimated workload 7.0 METS   Peak HR 151 bpm   Peak BP 157/90 mmHg   MPHR 176 bpm   SSS 10    SRS 2    SDS 8    LHR 0.43    TID 0.80    LV sys vol 29 mL   LV dias vol 80 46 - 106 mL  CBC with Differential     Status: None   Collection Time: 12/19/15  4:12 PM  Result Value Ref Range   WBC 6.7 3.6 - 11.0 K/uL   RBC 4.04 3.80 - 5.20 MIL/uL   Hemoglobin 12.9 12.0 - 16.0 g/dL   HCT 38.2 35.0 - 47.0 %   MCV 94.7 80.0 - 100.0 fL   MCH 31.8 26.0 - 34.0 pg   MCHC 33.6 32.0 - 36.0 g/dL   RDW 13.9 11.5 - 14.5 %   Platelets 194 150 - 440 K/uL   Neutrophils Relative % 50 %   Neutro Abs 3.4 1.4 - 6.5 K/uL   Lymphocytes Relative 36 %   Lymphs Abs 2.4 1.0 - 3.6 K/uL   Monocytes Relative 9 %   Monocytes Absolute 0.6 0.2 - 0.9 K/uL   Eosinophils Relative 5 %   Eosinophils Absolute 0.3 0 - 0.7 K/uL   Basophils Relative 0 %   Basophils Absolute 0.0 0 - 0.1 K/uL  Basic metabolic panel     Status: Abnormal   Collection Time: 12/19/15  4:12 PM  Result Value Ref Range   Sodium 141 135 - 145 mmol/L   Potassium 3.8 3.5 - 5.1 mmol/L   Chloride 106 101 - 111 mmol/L   CO2 28 22 - 32 mmol/L   Glucose, Bld 133 (H) 65 - 99 mg/dL   BUN 7 6 - 20 mg/dL   Creatinine, Ser 0.81 0.44 - 1.00 mg/dL   Calcium 9.0 8.9 - 10.3 mg/dL   GFR calc non Af Amer >60 >60 mL/min   GFR calc Af Amer >60 >60 mL/min    Comment: (NOTE) The eGFR has been calculated using the CKD  EPI equation. This calculation has not been validated in all clinical situations. eGFR's persistently <60 mL/min signify possible Chronic Kidney Disease.    Anion gap 7 5 - 15  Troponin I  Status: None   Collection Time: 12/19/15  4:12 PM  Result Value Ref Range   Troponin I <0.03 <0.03 ng/mL    Radiology No results found.   Assessment/Plan  Heart palpitations Recently saw cardiology. On metoprolol.  Fibromyalgia Definitely contributes to her lower extremity symptoms.  Varicose veins of leg with pain, bilateral Recommend:  The patient has had successful ablation of the previously incompetent saphenous venous system but still has persistent symptoms of pain and swelling that are having a negative impact on daily life and daily activities.  Patient should undergo injection sclerotherapy to treat the residual varicosities.  The risks, benefits and alternative therapies were reviewed in detail with the patient.  All questions were answered.  The patient agrees to proceed with sclerotherapy at their convenience.  The patient will continue wearing the graduated compression stockings and using the over-the-counter pain medications to treat her symptoms.         Leotis Pain, MD  02/15/2016 10:32 AM    This note was created with Dragon medical transcription system.  Any errors from dictation are purely unintentional

## 2016-02-21 ENCOUNTER — Encounter: Payer: Self-pay | Admitting: Internal Medicine

## 2016-03-14 ENCOUNTER — Encounter (INDEPENDENT_AMBULATORY_CARE_PROVIDER_SITE_OTHER): Payer: Self-pay | Admitting: Vascular Surgery

## 2016-03-14 ENCOUNTER — Ambulatory Visit (INDEPENDENT_AMBULATORY_CARE_PROVIDER_SITE_OTHER): Payer: Medicare Other | Admitting: Vascular Surgery

## 2016-03-14 VITALS — BP 138/89 | HR 84 | Resp 16 | Ht 65.0 in | Wt 194.0 lb

## 2016-03-14 DIAGNOSIS — I83812 Varicose veins of left lower extremities with pain: Secondary | ICD-10-CM | POA: Diagnosis not present

## 2016-03-14 DIAGNOSIS — I83813 Varicose veins of bilateral lower extremities with pain: Secondary | ICD-10-CM

## 2016-03-14 NOTE — Progress Notes (Signed)
Procedure: Hypertonic Saline Sclerotherapy Left  Indication: Patient presents with symptomatic varicose veins of the Bilateral  Procedure: Sclerotherapy using hypertonic saline mixed with 1% Lidocaine was performed on the Left. Compression wraps were placed. The patient tolerated the procedure well.  Plan: Follow up as needed.

## 2016-04-14 ENCOUNTER — Encounter (INDEPENDENT_AMBULATORY_CARE_PROVIDER_SITE_OTHER): Payer: Self-pay | Admitting: Vascular Surgery

## 2016-04-14 ENCOUNTER — Ambulatory Visit (INDEPENDENT_AMBULATORY_CARE_PROVIDER_SITE_OTHER): Payer: Medicare Other | Admitting: Vascular Surgery

## 2016-04-14 VITALS — BP 130/84 | HR 76 | Resp 16 | Ht 65.0 in | Wt 203.0 lb

## 2016-04-14 DIAGNOSIS — I83811 Varicose veins of right lower extremities with pain: Secondary | ICD-10-CM

## 2016-04-14 DIAGNOSIS — I83813 Varicose veins of bilateral lower extremities with pain: Secondary | ICD-10-CM

## 2016-04-14 NOTE — Progress Notes (Signed)
Procedure: Hypertonic Saline Sclerotherapy Right  Indication: Patient presents with symptomatic varicose veins of the Right  Procedure: Sclerotherapy using hypertonic saline mixed with 1% Lidocaine was performed on the Right. Compression wraps were placed. The patient tolerated the procedure well.  Plan: Follow up as needed.   

## 2016-04-18 ENCOUNTER — Telehealth: Payer: Self-pay | Admitting: Internal Medicine

## 2016-04-18 NOTE — Telephone Encounter (Signed)
Pt c/o medication issue:  1. Name of Medication: Metoprolol   2. How are you currently taking this medication (dosage and times per day)? 25 mg 2x a day   3. Are you having a reaction (difficulty breathing--STAT)?  No  4. What is your medication issue? Pt has urinated on her self a few times. Went from leakage to going through a few pads  She states she does have some bladder issues.  It didn't start like this when she first started the medication She states she does also have IC issues   Also her HR is still a bit over 100

## 2016-04-18 NOTE — Telephone Encounter (Signed)
Returned call to patient. Patient has interstitial cystitis. About 1 week ago, her urinary incontinence seemed to get worse.  She stated "I am peeing all over myself so much its running down my legs and I am going to get adult diapers." Usually she has urinary procedures periodically to help with this. She has not had a urinary procedure in a while because she broke her foot back in November and has had multiple issues r/t broken foot including poor wound healing. She has been more sedentary over the past several months because of the broken foot.  Dr Lucky Cowboy told her on 04/14/16 that she needs to increase her activity by walking more often as patient had been in a recliner the past several months.   In discussion with patient there have been several changes over the past few weeks with medications and activity: She took hyoscyamine for the past 3 weeks to help decrease bladder spasms, stopped her Savella 1 week ago, and has increased her physical activity.   Patient is thinking the metoprolol could be exacerbating her interstitial cystitis now. She started metoprolol 12/08/16. This is the first time her urinary incontinence has been so bad. She says metoprolol has been helping her heartrate.  She stated her HR was 110 at her PCP Dr Elijio Miles recently. Patient states she's been taking Sudafed regularly and was advised by PCP this could cause the increased heart rate.  Advised patient to call her urologist to discuss urinary incontinence and medications as well.  Will route to Dr End to address metoprolol along with interstitial cystitis and further advice.

## 2016-04-19 ENCOUNTER — Other Ambulatory Visit: Payer: Self-pay

## 2016-04-19 MED ORDER — CARVEDILOL 6.25 MG PO TABS: 6.2500 mg | ORAL_TABLET | Freq: Two times a day (BID) | ORAL | 3 refills | Status: DC

## 2016-04-19 NOTE — Telephone Encounter (Signed)
I recommend starting carvedilol 6.25 mg BID in place of the metoprolol. Thanks.

## 2016-04-19 NOTE — Telephone Encounter (Signed)
Reviewed recommendations w/pt who states urologist called and informed her once she stops metoprolol, they will set up procedure for her bladder. Urologist would like another medication recommendation.  She has not taken metoprolol in two days d/t incontinence.  Routed to Dr. Saunders Revel to review and advise.

## 2016-04-19 NOTE — Telephone Encounter (Signed)
Left detailed VM message regarding medication change. Provided CB number if questions. Prescription sent to [facility] Medication list updated.

## 2016-04-19 NOTE — Telephone Encounter (Signed)
I am not aware of any direct connection between metoprolol and symptoms associated with interstitial cystitis. Her symptoms also do not seem to correlate temporally with initiation of metoprolol. I would encourage her to continue taking metoprolol, as it seems to have helped her palpitations, and to speak with her PCP and urologist about her bladder issues.

## 2016-04-19 NOTE — Telephone Encounter (Signed)
Pt returned call.  Reviewed medication change per Dr. Saunders Revel. She requests prescription to be sent to [facility]. Resent coreg prescription and updated pharmacy preference.

## 2016-05-04 ENCOUNTER — Other Ambulatory Visit (HOSPITAL_COMMUNITY): Payer: Self-pay | Admitting: Neurology

## 2016-05-04 DIAGNOSIS — M542 Cervicalgia: Secondary | ICD-10-CM

## 2016-05-04 DIAGNOSIS — G629 Polyneuropathy, unspecified: Secondary | ICD-10-CM

## 2016-05-04 DIAGNOSIS — M545 Low back pain: Secondary | ICD-10-CM

## 2016-05-16 ENCOUNTER — Ambulatory Visit
Admission: RE | Admit: 2016-05-16 | Discharge: 2016-05-16 | Disposition: A | Discharge status: Home / Self Care | Payer: Medicare Other | Source: Ambulatory Visit | Attending: Neurology | Admitting: Neurology

## 2016-05-16 DIAGNOSIS — G608 Other hereditary and idiopathic neuropathies: Secondary | ICD-10-CM | POA: Diagnosis present

## 2016-05-16 DIAGNOSIS — M545 Low back pain: Secondary | ICD-10-CM

## 2016-05-16 DIAGNOSIS — M542 Cervicalgia: Secondary | ICD-10-CM | POA: Diagnosis present

## 2016-05-16 DIAGNOSIS — M5127 Other intervertebral disc displacement, lumbosacral region: Secondary | ICD-10-CM | POA: Insufficient documentation

## 2016-05-16 DIAGNOSIS — M5136 Other intervertebral disc degeneration, lumbar region: Secondary | ICD-10-CM | POA: Insufficient documentation

## 2016-05-16 DIAGNOSIS — M50322 Other cervical disc degeneration at C5-C6 level: Secondary | ICD-10-CM | POA: Insufficient documentation

## 2016-05-16 DIAGNOSIS — G629 Polyneuropathy, unspecified: Secondary | ICD-10-CM

## 2016-05-23 ENCOUNTER — Ambulatory Visit (INDEPENDENT_AMBULATORY_CARE_PROVIDER_SITE_OTHER): Payer: Medicare Other | Admitting: Vascular Surgery

## 2016-06-12 ENCOUNTER — Encounter: Payer: Self-pay | Admitting: Podiatry

## 2016-06-12 ENCOUNTER — Other Ambulatory Visit: Payer: Self-pay | Admitting: Internal Medicine

## 2016-06-12 ENCOUNTER — Ambulatory Visit (INDEPENDENT_AMBULATORY_CARE_PROVIDER_SITE_OTHER): Payer: Medicare Other | Admitting: Podiatry

## 2016-06-12 ENCOUNTER — Ambulatory Visit (INDEPENDENT_AMBULATORY_CARE_PROVIDER_SITE_OTHER): Payer: Medicare Other

## 2016-06-12 DIAGNOSIS — M79643 Pain in unspecified hand: Secondary | ICD-10-CM

## 2016-06-12 DIAGNOSIS — M2011 Hallux valgus (acquired), right foot: Secondary | ICD-10-CM

## 2016-06-12 DIAGNOSIS — M1990 Unspecified osteoarthritis, unspecified site: Secondary | ICD-10-CM | POA: Insufficient documentation

## 2016-06-12 DIAGNOSIS — G8929 Other chronic pain: Secondary | ICD-10-CM | POA: Insufficient documentation

## 2016-06-12 NOTE — Progress Notes (Signed)
Subjective:    Patient ID: April [name], female    DOB: May 16, 1970, 46 y.o.   MRN: [id number]  HPI: She presents today as a 46 year old white female with chief complaint of a painful fifth metatarsal base fracture since November 2017. She's been seen by multiple doctors who informed her that she needs an open reduction and internal fixation. She has factor V Leiden disease and is too much of a risk for the surgeon's. She also has a bunion that is painful when she would like to have this corrected as well. She states that the right foot is still very painful and inhibits her from performing her daily activities and is beginning to inhibit her from walking which she needs to maintain her health. She does have a history of heart failure. She states that she has seen a hematologist to stated that she would have less than 25% chance of embolism from DVT with lower extremity surgery. 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 History of fracture of the fifth metatarsal of the left foot which went on to heal uneventfully.    Review of Systems  All other systems reviewed and are negative.      Objective:   Physical Exam: Vital signs are stable she is alert and oriented 3 very talkative in no apparent distress walks with an antalgic gait. Pulses are strongly palpable bilaterally neurologic sensorium is intact per Semmes-Weinstein monofilament capillary fill time is immediate. Deep tendon reflexes are intact and symmetrical bilateral. Muscle strength +5 over 5 dorsiflexion plantar flexors and inverters everters all of his musculature is intact. She does have pain on abduction against resistance of the right foot. None on the left. Orthopedic evaluation demonstrates pain on palpation of the fifth metatarsal and of the peroneal brevis tendon right. She also has tenderness on palpation with a history of a superficial blister medial aspect of the first  metatarsophalangeal joint. Currently today cutaneous evaluation does not demonstrate any type of open lesions or wounds.  Radiographs taken today do demonstrate what appears to be an old fracture of the fifth metatarsal base of the right foot with a 5 mm diastases. This is proximal to her Jones fracture.        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  Plan: Discussed etiology pathology conservative versus surgical therapies. I expressed to this patient that I feel that she is too much of a risk for me to perform an outpatient basis. I expressed to her that surgery such as this would result in immobilization cast therapy and we would also have to use a tourniquet during surgery. I expressed to her that this would be more than likely enough to cause a DVT. I explained to her that we have a surgeon to May take on her case. I'm currently referring her to Dr. Daylene Katayama for consideration of hospitalized surgical case.  I expressed to the patient that should he consider this case that she may be hospitalized or started on Lovenox several days in advance of her surgery. The Lovenox will be discontinued surgery would be performed then Lovenox will be reinstated in the play she would be transitioned to Hollis or Eliquis while casted. She understands that this may be for a period of 8-12 weeks.  Also explained to her that she would have to sign a consent form stating specifically that she understands  the risks of blood clot and the risk of death. She states that she is ready for surgery and will do anything that she has to do at this point to feel better.

## 2016-06-16 ENCOUNTER — Telehealth: Payer: Self-pay | Admitting: Internal Medicine

## 2016-06-16 ENCOUNTER — Other Ambulatory Visit: Payer: Self-pay | Admitting: Nurse Practitioner

## 2016-06-16 MED ORDER — DILTIAZEM HCL ER COATED BEADS 120 MG PO CP24: 120.0000 mg | ORAL_CAPSULE | Freq: Every day | ORAL | 3 refills | Status: DC

## 2016-06-16 NOTE — Telephone Encounter (Signed)
Spoke with patient and reviewed recommendations to try diltiazem 120 mg once daily. She was agreeable to trying this and sent to her pharmacy of preference. She verbalized understanding of instructions and had no further questions at this time.

## 2016-06-16 NOTE — Telephone Encounter (Signed)
Pt states ] she is  taking Carvedilol, and is making her have bladder leakage. She states she has stopped taking it due to this side effect. She states her HR is elevated again. She states he chest is hurting so bad, she may have to go to the ED. She is not sure what her HR is, but she "feels" like it is 149.  Pt c/o of Chest Pain: STAT if CP now or developed within 24 hours  1. Are you having CP right now? yes  2. Are you experiencing any other symptoms (ex. SOB, nausea, vomiting, sweating)? Racing heart rate  3. How long have you been experiencing CP? Since she stopped her Carvedilol   4. Is your CP continuous or coming and going? continual  5. Have you taken Nitroglycerin? no ?

## 2016-06-16 NOTE — Telephone Encounter (Signed)
This sounds reasonable, though I am unsure that her bladder issues are related to beta blockers. We will proceed with diltiazem.

## 2016-06-16 NOTE — Telephone Encounter (Signed)
Patient states that her heart rate is racing and she had to stop some of her medications. She was on Metoprolol and developed incontinence and had to stop it. Then she was started on carvedilol and she has now started having bladder spasms and incontinence with this as well and she had to stop it. She states that her heart rate has been elevated and she needs another type of medication. Let her know that I would try to see if I can get someone to look it over before the weekend and then be in touch. She was appreciative for the call and had no further questions.

## 2016-06-16 NOTE — Telephone Encounter (Signed)
We can change class and see if she tolerates better - dilt cd 120 mg daily.

## 2016-06-30 ENCOUNTER — Ambulatory Visit (INDEPENDENT_AMBULATORY_CARE_PROVIDER_SITE_OTHER): Payer: Medicare Other | Admitting: Podiatry

## 2016-06-30 DIAGNOSIS — M21611 Bunion of right foot: Secondary | ICD-10-CM

## 2016-06-30 DIAGNOSIS — M778 Other enthesopathies, not elsewhere classified: Secondary | ICD-10-CM

## 2016-06-30 DIAGNOSIS — M2011 Hallux valgus (acquired), right foot: Secondary | ICD-10-CM

## 2016-06-30 DIAGNOSIS — M7751 Other enthesopathy of right foot: Secondary | ICD-10-CM

## 2016-06-30 DIAGNOSIS — M779 Enthesopathy, unspecified: Secondary | ICD-10-CM

## 2016-06-30 DIAGNOSIS — S99191K Other physeal fracture of right metatarsal, subsequent encounter for fracture with nonunion: Secondary | ICD-10-CM

## 2016-07-03 MED ORDER — BETAMETHASONE SOD PHOS & ACET 6 (3-3) MG/ML IJ SUSP: 3.0000 mg | Freq: Once | INTRAMUSCULAR | Status: DC

## 2016-07-03 NOTE — Progress Notes (Addendum)
   HPI: Patient presents today as a referral from Dr. Milinda Pointer for evaluation of right foot pain. Patient has had significant amount of right foot pain for the past several months and was referred to me by Dr. Milinda Pointer for possible surgical consideration. Patient has been diagnosed in the past with fifth metatarsal fracture right foot, nonunion as well as hallux valgus deformity of the right foot. Patient has multiple comorbidities and medical factors that would need to be addressed prior to any surgical intervention. Using the patient's chart.   Physical Exam: General: The patient is alert and oriented x3 in no acute distress.  Dermatology: Skin is warm, dry and supple bilateral lower extremities. Negative for open lesions or macerations.  Vascular: Palpable pedal pulses bilaterally. No edema or erythema noted. Capillary refill within normal limits.  Neurological: Epicritic and protective threshold grossly intact bilaterally.   Musculoskeletal Exam: Pain on palpation to the right midfoot consistent with a capsulitis. Pain on palpation also noted to the base of the fifth metatarsal right foot.  Radiographic Exam:  X-rays reviewed which were taken on last visit on 06/12/2016. Increased intermetatarsal angle greater than 15 of the hallux abductus angle greater than 30 noted. There is a nonunion fracture of the fifth metatarsal avulsion type. Gapping appears to be 1.5 mm there is not appear to be any routine healing noted or callus formation secondary to healing.. This nonunion fracture fragment is nondisplaced.  Assessment: 1. Fifth metatarsal fracture right foot-nonunion 2. Bunion deformity right foot 3. Capsulitis right midfoot  Plan of Care:  1. Patient was evaluated.   x-rays taken last visit were reviewed. 2. Injection of 0.5 mL Celestone Soluspan injected into the right midfoot at the Lisfranc joint 3. Recommend padding for bunion deformity 4. Today we're going to request authorization for  bone stimulator. The patient has had this visit metatarsal fracture for approximately 6 months without any sign of healing. The injury occurred November 2017. 5. Patient has a history of multiple DVTs and blood clotting factor. Patient is a high risk surgery. At the moment we are going to pursue all conservative modalities and not consider surgical intervention at this point. 6. Return to clinic in 4 weeks    Edrick Kins, DPM [facility]  Dr. Edrick Kins, [address]                                        [place], [place] [address]                Office [phone]  Fax [phone]

## 2016-07-04 ENCOUNTER — Telehealth: Payer: Self-pay | Admitting: *Deleted

## 2016-07-04 NOTE — Telephone Encounter (Addendum)
-----   Message from Edrick Kins, DPM sent at 07/03/2016  8:00 AM EDT ----- Regarding: Bone Stimulator This patient be approved for bone stimulator?  Dx: Fifth metatarsal Jones fracture right foot w/ nonunion. DOI: Nov 2017.   Thanks, Dr. Amalia Hailey. 07/04/2016-I informed Lytle Michaels - [employer] of Dr. Amalia Hailey request for bone growth stimulator.

## 2016-07-04 NOTE — Telephone Encounter (Signed)
Completed. Thanks, Dr. Amalia Hailey

## 2016-07-07 ENCOUNTER — Telehealth: Payer: Self-pay | Admitting: Internal Medicine

## 2016-07-07 NOTE — Telephone Encounter (Signed)
°  Per patient she has diastolic Heart Failure  When she has lower bp her HR increases and she feels nervous late at night and is unable to sleep Patient concerned about HR 80-90  At rest   Patient also has severe diarrhea patient is not sure if this is why medication is not working as well   Please call to discuss

## 2016-07-07 NOTE — Telephone Encounter (Signed)
Spoke w/ pt.  She reports that her BP has been good, but she is concerned about her HR. HR now is 110. She states that she cannot take BB 2/2 urinary incontinence. She reports that she has not been sleeping due her to heart racing.  Pt has multiple complaints and it is difficult to keep her focused on one symptom to discuss. Pt has only seen Dr. Saunders Revel once and is concerned that he did not have adequate test results at that time.  Recommended she come in for ov to discuss her issues w/ Dr. Saunders Revel in detail. She is agreeable and will await a call from scheduling on Monday to set this up.

## 2016-07-12 NOTE — Progress Notes (Signed)
Patient's Name: April [name]  MRN: [id number]  Referring Provider: Jodi Marble, MD  DOB: 10-Apr-1970  PCP: Jodi Marble, MD  DOS: 07/13/2016  Note by: Dionisio David NP  Service setting: Ambulatory outpatient  Specialty: Interventional Pain Management  Location: [facility] (AMB) Pain Management Facility    Patient type: New Patient    Primary Reason(s) for Visit: Initial Patient Evaluation CC: Leg Pain (fibromyalgia and rheumatoid arthritis); Back Pain; Hand Pain; Pelvic Pain (interstitial cystitis); and Headache  HPI  April [name] is a 46 y.o. year old, female patient, who comes today for an initial evaluation. She has Abdominal pain; Acute DVT (deep venous thrombosis)-right gonadal; SOB (shortness of breath) on exertion; Factor V Leiden mutation ([place]); Anxiety; Fibromyalgia; Chronic interstitial cystitis; Hematuria; Sensory disturbance; Thrombosis; Heart palpitations; Varicose veins of lower extremity; Atypical chest pain; Diastolic dysfunction; Allergic rhinitis; Anemia; Arthritis; Asthma; At risk for polypharmacy; Bladder spasms; Breast cyst; Chronic mastitis; Chronic hand pain; Cyst of breast; Cyst of ovary; Thromboembolism of deep veins of lower extremity ([place]); Skin sensation disturbance; Female stress incontinence; Gastric reflux; Headache; Hematuria syndrome; Hemorrhoid; High-tone pelvic floor dysfunction; History of migraine; History of migraine headaches; Hypercoagulable state ([place]); Increased frequency of urination; Inflammatory arthritis; Irritable bowel syndrome with diarrhea; Mastitis chronic; Medication management; Nasal septal deviation; Ovarian cyst, left; Numbness; Pelvic floor dysfunction; Polypharmacy; Primary hypercoagulable state ([place]); Rectocele; Rectocele, baby delivered; Sinusitis; Spasm of bladder; Venous thrombosis of lower extremity; TMJ disease; Vaginal pain; Venous embolism; Long term current use of opiate analgesic; Chronic pain syndrome; Opiate use; Long term prescription  opiate use; Neck pain; and Knee pain, chronic on her problem list.. Her primarily concern today is the Leg Pain (fibromyalgia and rheumatoid arthritis); Back Pain; Hand Pain; Pelvic Pain (interstitial cystitis); and Headache  Pain Assessment: Self-Reported Pain Score: 7 /10 Clinically the patient looks like a 0/10 Reported level is inconsistent with clinical observations. Information on the proper use of the pain scale provided to the patient today Pain Type: Chronic pain Pain Location: Head Pain Descriptors / Indicators: Burning, Stabbing (knawing, pinching) Pain Frequency: Constant  Onset and Duration: Sudden Cause of pain: Surgery Severity: NAS-11 at its worse: 10/10 Timing: Not influenced by the time of the day, During activity or exercise, After activity or exercise and After a period of immobility Aggravating Factors: Climbing, Eating, Intercourse (sex), Kneeling, Lifiting, Prolonged sitting, Prolonged standing, Squatting, Stooping , Surgery made it worse, Twisting, Walking, Walking uphill and Walking downhill Alleviating Factors: Medications, Resting, Warm showers or baths and Chiropractic manipulations Associated Problems: Constipation, Day-time cramps, Fatigue, Inability to concentrate, Inability to control bladder (urine), Inability to control bowel, Nausea, Numbness, Spasms, Swelling, Temperature changes, Tingling, Weakness, Pain that wakes patient up and Pain that does not allow patient to sleep Quality of Pain: Aching, Agonizing, Burning, Deep, Disabling, Exhausting, Nagging, Sharp, Shooting, Sickening, Splitting, Tender, Throbbing and Tingling Previous Examinations or Tests: Biopsy, CT scan, Endoscopy, MRI scan, X-rays, Nerve conduction test, Chiropractic evaluation and Psychiatric evaluation Previous Treatments: Chiropractic manipulations, Narcotic medications, Pool exercises, Steroid treatments by mouth, TENS and Trigger point injections  The patient comes into the clinics today  for the first time for a chronic pain management evaluation. According to this patient her pain is worse in her head. She admits that she's been having headaches since she was 23 years ago. She admits that she has had a workup for headaches. She has completed oxygen therapy along with other medications have not been effective. She states in 1999 she was diagnosed with  TMJ. She is currently seeing a chiropractor for cervical alignment. She admits that she did have shingles in her for head in the past. She admits that there is a familiar history of migraines. She denies any previous interventional treatment or physical therapy. MRI of head; 2015 is negative.  Her second area of pain is in her lower back. She admits that she suffered an injury at the age of 36. She admits that her "spine was crushed" after several football players fell on her. She denies any radicular symptoms. She denies any previous interventions, physical therapy. MRI 2017.  Her third area of pain is in her right foot. She fractured her foot November after a fall.. She is currently being treated by [facility]. She is to have a bone stimulator done today. She denies any surgeries. She has had interventions done in her toes. X-rays completed at [facility].  She admits that she has fibromyalgia. He has arthritis in her shoulders, hands, elbows, wrists knees and toes. She denies any interventional therapies, physical therapy or recent images.  He admits that she also has interstitial cystitis along with pelvic floor dysfunction. She is followed by urology for this. She does have interventional procedures done periodically.  She feels that the history of recurrent shingles, complicated pregnancy rectocele in 2016 of all contribute to her pain.  She has been into previous pain clinics Dr. Humphrey Rolls and [facility]. She admits that she is currently using heat and ibuprofen along with Tylenol.  Today I took the time to  provide the patient with information regarding this pain practice. The patient was informed that the practice is divided into two sections: an interventional pain management section, as well as a completely separate and distinct medication management section. I explained that there are procedure days for interventional therapies, and evaluation days for follow-ups and medication management. Because of the amount of documentation required during both, they are kept separated. This means that there is the possibility that she may be scheduled for a procedure on one day, and medication management the next. I have also informed her that because of staffing and facility limitations, this practice will no longer take patients for medication management only. To illustrate the reasons for this, I gave the patient the example of surgeons, and how inappropriate it would be to refer a patient to his/her care, just to write for the post-surgical antibiotics on a surgery done by a different surgeon.   Because interventional pain management is part of the board-certified specialty for the doctors, the patient was informed that joining this practice means that they are open to any and all interventional therapies. I made it clear that this does not mean that they will be forced to have any procedures done. What this means is that I believe interventional therapies to be essential part of the diagnosis and proper management of chronic pain conditions. Therefore, patients not interested in these interventional alternatives will be better served under the care of a different practitioner.  The patient was also made aware of my Comprehensive Pain Management Safety Guidelines where by joining this practice, they limit all of their nerve blocks and joint injections to those done by our practice, for as long as we are retained to manage their care. Historic Controlled Substance Pharmacotherapy Review  PMP and historical list of  controlled substances: Diazepam 10 mg daily, Suboxone 8 mg/2 mg sublingual film daily, tramadol 50 mg 4 times daily, Fentanyl 50 g patch every  48 hours, hydrocodone/acetaminophen 10/325 mg 4 times daily, Fentanyl 37.5 g patch every 72 hours, Fioricet with codeine 4 times daily, Lyrica 150 mg twice daily, Diphenoxylate-Atropine 2.5/2.'025mg'$  daily, Cheratussin AC  100 mL, testosterone propionate powder daily, hydrocodone/acetaminophen 7.5/'325mg'$  4 times daily, diazepam 10 mg daily, diazepam powder daily, hydrocodone/ibuprofen 7.5/'200mg'$  3 times daily, hydrocodone/acetaminophen 5/'325mg'$  3 times daily, oxycodone/acetaminophenmg 10/325 3 times daily, morphine sulfate ER 50 mg tablets twice daily, hydrocodone/acetaminophen 5/'500mg'$  6 times daily Highest opioid analgesic regimen found: Fentanyl 50 g patch every 48 hours, tramadol 50 mg 4 times daily, hydrocodone/acetaminophen 10/325 mg 4 times daily Most recent opioid analgesic: Suboxone 8 mg/2 mg sublingual daily Current opioid analgesics: None Highest recorded MME/day: 240 mg/day MME/day: None mg/day Medications: The patient did not bring the medication(s) to the appointment, as requested in our "New Patient Package" Pharmacodynamics: Desired effects: Analgesia: The patient reports >50% benefit. Reported improvement in function: The patient reports medication allows her to accomplish basic ADLs. Clinically meaningful improvement in function (CMIF): Sustained CMIF goals met Perceived effectiveness: Described as relatively effective, allowing for increase in activities of daily living (ADL) Undesirable effects: Side-effects or Adverse reactions: None reported Historical Monitoring: The patient  reports that she does not use drugs. List of all UDS Test(s): No results found for: MDMA, COCAINSCRNUR, PCPSCRNUR, PCPQUANT, CANNABQUANT, THCU, [place] List of all Serum Drug Screening Test(s):  No results found for: AMPHSCRSER, BARBSCRSER, BENZOSCRSER, COCAINSCRSER,  PCPSCRSER, PCPQUANT, THCSCRSER, CANNABQUANT, OPIATESCRSER, OXYSCRSER, PROPOXSCRSER Historical Background Evaluation: [place] PDMP: Six (6) year initial data search conducted.             [place] Department of public safety, offender search: Editor, commissioning Information) Non-contributory Risk Assessment Profile: Aberrant behavior: prescription drug misuse, unsafe use of medication, altering the route of delivery and medication diversion Risk factors for fatal opioid overdose: Concomitant use of Benzodiazepines, Caucasian, Multiple prescribers and High daily dosage Fatal overdose hazard ratio (HR): 2.04 for doses equal to, or higher than 100 MME/day Non-fatal overdose hazard ratio (HR): 2.88 for doses equal to, or higher than 200 MME/day Risk of opioid abuse or dependence: 0.7-3.0% with doses ? 36 MME/day and 6.1-26% with doses ? 120 MME/day. Substance use disorder (SUD) risk level: Pending results of Medical Psychology Evaluation for SUD Opioid risk tool (ORT) (Total Score): 1  ORT Scoring interpretation table:  Score <3 = Low Risk for SUD  Score between 4-7 = Moderate Risk for SUD  Score >8 = High Risk for Opioid Abuse   PHQ-2 Depression Scale:  Total score: 0  PHQ-2 Scoring interpretation table: (Score and probability of major depressive disorder)  Score 0 = No depression  Score 1 = 15.4% Probability  Score 2 = 21.1% Probability  Score 3 = 38.4% Probability  Score 4 = 45.5% Probability  Score 5 = 56.4% Probability  Score 6 = 78.6% Probability   PHQ-9 Depression Scale:  Total score: 0  PHQ-9 Scoring interpretation table:  Score 0-4 = No depression  Score 5-9 = Mild depression  Score 10-14 = Moderate depression  Score 15-19 = Moderately severe depression  Score 20-27 = Severe depression (2.4 times higher risk of SUD and 2.89 times higher risk of overuse)   Pharmacologic Plan: Pending ordered tests and/or consults  Meds  The patient has a current medication list which includes the following  prescription(s): albuterol, azelastine hcl, baclofen, butalbital-acetaminophen-caffeine, calcium-phosphorus-vitamin d, cartia xt, vitamin d3, cyanocobalamin, diazepam, fentanyl, fexofenadine hcl, fluticasone, fluticasone-salmeterol, furosemide, gabapentin, guaifenesin, hydroxyzine, hyoscyamine, montelukast, multivitamin with minerals, pantoprazole, pentosan polysulfate, prebiotic product,  align, progesterone, pseudoephedrine, simethicone, valacyclovir, vitamin e, zinc, butalbital-acetaminophen-caffeine, hydrocodone-acetaminophen, and tramadol, and the following Facility-Administered Medications: betamethasone acetate-betamethasone sodium phosphate.  Current Outpatient Prescriptions on File Prior to Visit  Medication Sig  . albuterol (PROVENTIL HFA;VENTOLIN HFA) 108 (90 BASE) MCG/ACT inhaler Inhale 1 puff into the lungs as needed. For shortness of breath or wheezing  . Azelastine HCl 0.15 % SOLN U 1 TO 2 SPRAYS IEN D  . baclofen (LIORESAL) 20 MG tablet Take 20 mg by mouth 3 (three) times daily.  . butalbital-acetaminophen-caffeine (FIORICET, ESGIC) 50-325-40 MG tablet TK 1 T PO Q 6 H PRN  . Calcium-Phosphorus-Vitamin D (CALCIUM GUMMIES PO) Take 1 tablet by mouth every evening.  [name] CARTIA XT 120 MG 24 hr capsule TAKE 1 CAPSULE(120 MG) BY MOUTH DAILY  . Cyanocobalamin 5000 MCG SUBL Place 5,000 mcg under the tongue daily.   . diazepam (VALIUM) 10 MG tablet Take 10 mg by mouth daily.  [name] Fexofenadine HCl (ALLEGRA PO) Take 1 tablet by mouth daily as needed. For allergies/sinues  . fluticasone (FLONASE) 50 MCG/ACT nasal spray SHAKE LQ AND U 1 TO 2 SPRAYS IEN QD UTD  . Fluticasone-Salmeterol (ADVAIR) 250-50 MCG/DOSE AEPB Inhale 1 puff into the lungs as needed.  . furosemide (LASIX) 20 MG tablet Take 20 mg by mouth daily.  [name] gabapentin (NEURONTIN) 300 MG capsule Take 300 mg by mouth 4 (four) times daily.   [name] guaifenesin (HUMIBID E) 400 MG TABS Take 1,200 mg by mouth 2 (two) times daily as needed. For  congestion  . hydrOXYzine (VISTARIL) 25 MG capsule Take 50 mg by mouth at bedtime.   . hyoscyamine (LEVBID) 0.375 MG 12 hr tablet TK 1 T PO Q 12 H  . montelukast (SINGULAIR) 10 MG tablet Take 10 mg by mouth at bedtime.  . Multiple Vitamin (MULITIVITAMIN WITH MINERALS) TABS Take 1 tablet by mouth every evening.   . pantoprazole (PROTONIX) 40 MG tablet Take 40 mg by mouth daily.  . pentosan polysulfate (ELMIRON) 100 MG capsule Take 200 mg by mouth 2 (two) times daily before a meal.   . Probiotic Product (ALIGN) 4 MG CAPS Take 1 capsule by mouth daily.  . progesterone (PROMETRIUM) 200 MG capsule 100 mg daily.   . pseudoephedrine (SUDAFED) 30 MG tablet Take 60 mg by mouth 4 (four) times daily.  . Simethicone 125 MG CAPS Take 125 mg by mouth 4 (four) times daily.  . valACYclovir (VALTREX) 500 MG tablet TK 1 T PO QD  . butalbital-acetaminophen-caffeine (FIORICET WITH CODEINE) 50-325-40-30 MG capsule Take 1 capsule by mouth every 4 (four) hours as needed for headache.  [name] HYDROcodone-acetaminophen (NORCO) 10-325 MG per tablet Take 1 tablet by mouth every 6 (six) hours as needed for pain. For pain  . traMADol (ULTRAM) 50 MG tablet Take 1 tablet (50 mg total) by mouth every 6 (six) hours as needed. (Patient not taking: Reported on 07/13/2016)   Current Facility-Administered Medications on File Prior to Visit  Medication  . betamethasone acetate-betamethasone sodium phosphate (CELESTONE) injection 3 mg   Imaging Review  Cervical Imaging: Cervical MR wo contrast:  Results for orders placed during the hospital encounter of 05/16/16  MR CERVICAL SPINE WO CONTRAST   Narrative CLINICAL DATA:  Generalized neck and right arm pain for 3 years. Headaches. No known injury.  EXAM: MRI CERVICAL SPINE WITHOUT CONTRAST  TECHNIQUE: Multiplanar, multisequence MR imaging of the cervical spine was performed. No intravenous contrast was administered.  COMPARISON:  None.  FINDINGS: Alignment:  Normal.  Vertebrae: No fracture or worrisome lesion.  Cord: Normal signal throughout.  Posterior Fossa, vertebral arteries, paraspinal tissues: Negative.  Disc levels:  C2-3:  Negative.  C3-4:  Mild facet degenerative change.  Otherwise negative.  C4-5: Advanced facet degenerative disease is seen on the left but there is no marrow edema about the joint. Mild uncovertebral spurring is present. The central canal and foramina are open.  C5-6: Shallow disc bulge and uncovertebral disease without central canal stenosis. Mild bilateral foraminal narrowing is more notable on the left.  C6-7: Minimal disc bulge without central canal or foraminal stenosis.  C7-T1:  Negative.  IMPRESSION: Widely patent central canal at all levels. Uncovertebral spurring causes mild bilateral foraminal narrowing at C5-6.  Scattered facet degenerative change appears advanced on the left at C4-5 although there is no marrow edema about the joint.   Electronically Signed   By: Drusilla Kanner M.D.   On: 05/16/2016 10:43    Cervical MR wo contrast:  Cervical MR w/wo contrast:  Cervical MR w contrast:  Cervical CT wo contrast:  Cervical CT w/wo contrast:  Cervical CT w/wo contrast:  Cervical CT w contrast:  Cervical CT outside:  Cervical DG 1 view:  Cervical DG 2-3 views:  Cervical DG F/E views:  Cervical DG 2-3 clearing views:  Cervical DG Bending/F/E views:  Cervical DG complete:  Cervical DG Myelogram views:  Cervical DG Myelogram views:  Cervical Discogram views:   Shoulder Imaging: Shoulder-R MR w contrast:  Shoulder-L MR w contrast:  Shoulder-R MR w/wo contrast:  Shoulder-L MR w/wo contrast:  Shoulder-R MR wo contrast:  Shoulder-L MR wo contrast:  Shoulder-R CT w contrast:  Shoulder-L CT w contrast:  Shoulder-R CT w/wo contrast:  Shoulder-L CT w/wo contrast:  Shoulder-R CT wo contrast:  Shoulder-L CT wo contrast:  Insurance claims handler DG Arthrogram:  Shoulder-L DG Arthrogram:   Shoulder-R DG 1 view:  Shoulder-L DG 1 view:  Shoulder-R DG:  Shoulder-L DG:   Thoracic Imaging: Thoracic MR wo contrast:  Thoracic MR wo contrast:  Thoracic MR w/wo contrast:  Thoracic MR w contrast:  Thoracic CT wo contrast:  Thoracic CT w/wo contrast:  Thoracic CT w/wo contrast:  Thoracic CT w contrast:  Thoracic DG 2-3 views:  Thoracic DG 4 views:  Thoracic DG:  Thoracic DG w/swimmers view:  Thoracic DG Myelogram views:  Thoracic DG Myelogram views:   Lumbosacral Imaging: Lumbar MR wo contrast:  Results for orders placed during the hospital encounter of 05/16/16  MR LUMBAR SPINE WO CONTRAST   Narrative CLINICAL DATA:  Low back and bilateral hip and leg pain for 4-5 years. No known injury.  EXAM: MRI LUMBAR SPINE WITHOUT CONTRAST  TECHNIQUE: Multiplanar, multisequence MR imaging of the lumbar spine was performed. No intravenous contrast was administered.  COMPARISON:  MRI lumbar spine 09/23/2012.  FINDINGS: Segmentation:  Standard.  Alignment:  No listhesis.  Mild convex left scoliosis is noted.  Vertebrae:  Height and signal are normal.  Conus medullaris: Extends to the L1 level and appears normal.  Paraspinal and other soft tissues: Negative.  Disc levels:  T10-11 and T11-12 are imaged in the sagittal plane only and negative.  T12-L1:  Negative.  L1-2:  Negative.  L2-3:  Negative.  L3-4:  Negative.  L4-5: Mild facet degenerative change. No disc bulge or protrusion. The central canal and foramina are widely patent.  L5-S1: Minimal disc bulge without central canal or foraminal stenosis.  IMPRESSION: Mild facet arthropathy L4-5 and a minimal disc bulge L5-S1 do not appear  notably changed since the prior exam. The central canal and foramina are widely patent at all levels.   Electronically Signed   By: Inge Rise M.D.   On: 05/16/2016 10:54    Lumbar MR wo contrast:  Results for orders placed in visit on 09/23/12  MR [facility]  W/O Cm   Narrative * PRIOR REPORT IMPORTED FROM AN EXTERNAL SYSTEM *   PRIOR REPORT IMPORTED FROM THE [facility] WORKFLOW SYSTEM   REASON FOR EXAM:    lumbago  COMMENTS:   PROCEDURE:     MR  - MR LUMBAR SPINE WO CONTRAST  - Sep 23 2012 11:06AM   RESULT:   Technique:  Multiplanar and multisequence imaging of the lumbar spine was  obtained without administration of Gadolinium.   Findings: Conus medullaris terminates at L1 level. Cauda equina  demonstrate  no evidence of clumping or thickening.   At the T12-L1, L1-2, L2-3, L3-4, L4-5, and L5-S1 levels, there is no  evidence of thecal sac stenosis or neuroforaminal narrowing. The osseous  structures demonstrate no evidence of marrow edema. There is no evidence  of  linear signal void reflecting fracture.   IMPRESSION:      There is no evidence of thecal sac stenosis or  neuroforaminal narrowing.   Thank you for this opportunity to contribute to the care of your patient.       Lumbar MR w/wo contrast:  Lumbar MR w contrast:  Lumbar CT wo contrast:  Lumbar CT w/wo contrast:  Lumbar CT w/wo contrast:  Lumbar CT w contrast:  Lumbar DG 1V:  Lumbar DG 1V (Clearing):  Lumbar DG 2-3V (Clearing):  Lumbar DG 2-3 views:  Lumbar DG (Complete) 4+V:  Results for orders placed during the hospital encounter of 06/22/11  DG Lumbar Spine Complete   Narrative *RADIOLOGY REPORT*  Clinical Data: Back pain.  LUMBAR SPINE - COMPLETE 4+ VIEW  Comparison: None  Findings: There is a mild left convex lumbar scoliosis.  Normal alignment on the lateral film.  Disc spaces and vertebral bodies are maintained.  The facets are maintained and no pars defects are seen.  The visualized bony pelvis is intact.  IMPRESSION:  1.  Mild left convex thoracolumbar scoliosis. 2.  No significant degenerative changes or acute findings.  Original Report Authenticated By: P. Kalman Jewels, M.D.   Lumbar DG F/E views:  Lumbar DG Bending views:  Lumbar DG  Myelogram views:  Lumbar DG Myelogram:  Lumbar DG Myelogram:  Lumbar DG Myelogram:  Lumbar DG Myelogram Lumbosacral:  Lumbar DG Diskogram views:  Lumbar DG Diskogram views:  Lumbar DG Epidurogram OP:  Lumbar DG Epidurogram IP:   Sacroiliac Joint Imaging: Sacroiliac Joint DG:  Sacroiliac Joint MR w/wo contrast:  Sacroiliac Joint MR wo contrast:   Spine Imaging: Whole Spine DG Myelogram views:  Whole Spine MR Mets screen:  Whole Spine MR Mets screen:  Whole Spine MR w/wo:  MRA Spinal Canal w/ cm:  MRA Spinal Canal wo/ cm:  MRA Spinal Canal w/wo cm:  Spine Outside MR Films:  Spine Outside CT Films:  CT-Guided Biopsy:  CT-Guided Needle Placement:  DG Spine outside:  IR Spine outside:  NM Spine outside:   Hip Imaging: Hip-R MR w contrast:  Hip-L MR w contrast:  Hip-R MR w/wo contrast:  Hip-L MR w/wo contrast:  Hip-R MR wo contrast:  Hip-L MR wo contrast:  Hip-R CT w contrast:  Hip-L CT w contrast:  Hip-R CT w/wo contrast:  Hip-L CT w/wo contrast:  Hip-R CT wo contrast:  Hip-L CT wo contrast:  Hip-R DG 2-3 views:  Hip-L DG 2-3 views:  Hip-R DG Arthrogram:  Hip-L DG Arthrogram:  Hip-B DG Bilateral:   Knee Imaging: Knee-R MR w contrast:  Knee-L MR w contrast:  Knee-R MR w/wo contrast:  Knee-L MR w/wo contrast:  Knee-R MR wo contrast:  Knee-L MR wo contrast:  Knee-R CT w contrast:  Knee-L CT w contrast:  Knee-R CT w/wo contrast:  Knee-L CT w/wo contrast:  Knee-R CT wo contrast:  Knee-L CT wo contrast:  Knee-R DG 1-2 views:  Knee-L DG 1-2 views:  Knee-R DG 3 views:  Knee-L DG 3 views:  Knee-R DG 4 views:  Knee-L DG 4 views:  Knee-R DG Arthrogram:  Knee-L DG Arthrogram:   Note: Available results from prior imaging studies were reviewed.        ROS  Cardiovascular History: Heart trouble, Chest pain and Heart failure Pulmonary or Respiratory History: Asthma, Shortness of breath and Bronchitis Neurological History: Scoliosis Review of Past Neurological  Studies:  Results for orders placed or performed in visit on 11/17/13  MR Brain W Wo Contrast   Narrative   * PRIOR REPORT IMPORTED FROM AN EXTERNAL SYSTEM *   CLINICAL DATA:  Daily headaches.  Numbness.  Vision changes   EXAM:  MRI HEAD WITHOUT AND WITH CONTRAST   TECHNIQUE:  Multiplanar, multiecho pulse sequences of the brain and surrounding  structures were obtained without and with intravenous contrast.   CONTRAST:  18 mL MultiHance   COMPARISON:  CT head without contrast 07/16/2013.   FINDINGS:  No acute infarct, hemorrhage, or mass lesion is present. The  ventricles are of normal size. No significant extraaxial fluid  collection is present.   Flow is present in the major intracranial arteries. Globes and  orbits are intact. The paranasal sinuses and mastoid air cells are  clear.   The postcontrast images demonstrate no pathologic enhancement.   IMPRESSION:  Negative MRI of the brain. No significant white matter disease or  evidence for a demyelinating process.    Electronically Signed    By: Lawrence Santiago M.D.    On: 11/17/2013 17:24      Results for orders placed or performed during the hospital encounter of 10/20/10  MR Brain Wo Contrast   Narrative   This examination was performed at [place] at [facility]. The interpretation will be provided by [facility].  Original Report Authenticated By: Randall An, M.D.   Psychological-Psychiatric History: Negative for anxiety, depression, schizophrenia, bipolar disorders or suicidal ideations or attempts Gastrointestinal History: Hiatal hernia, Reflux or heatburn, Irritable Bowel Syndrome (IBS), Pancreatitis and Constipation Genitourinary History: Hematuria and Recurrent Urinary Tract infections Hematological History: Anemia and Coagulation disorder Endocrine History: Negative for diabetes or thyroid disease Rheumatologic History: Osteoarthritis, Rheumatoid  arthritis and Fibromyalgia Musculoskeletal History: Negative for myasthenia gravis, muscular dystrophy, multiple sclerosis or malignant hyperthermia Work History: Disabled  Allergies  Ms. Boesen is allergic to amitriptyline; amoxicillin; bromelains; celexa [citalopram hydrobromide]; ivp dye  [iodinated diagnostic agents]; nortriptyline; penicillins; pregabalin; bromelains [pineapple extract]; budesonide-formoterol fumarate; citalopram; methylene blue; sertraline; sulfonamide derivatives; bromaline [albertsons di bromm]; and other.  Laboratory Chemistry  Inflammation Markers Lab Results  Component Value Date   CRP 3.2 07/13/2016   ESRSEDRATE 3 07/13/2016   (CRP: Acute Phase) (ESR: Chronic Phase) Renal Function Markers Lab Results  Component Value Date   BUN 5 (L) 07/13/2016   CREATININE 0.77 07/13/2016   GFRAA  109 07/13/2016   GFRNONAA 94 07/13/2016   Hepatic Function Markers Lab Results  Component Value Date   AST 18 07/13/2016   ALT 35 (H) 07/13/2016   ALBUMIN 4.5 07/13/2016   ALKPHOS 40 07/13/2016   Electrolytes Lab Results  Component Value Date   NA 142 07/13/2016   K 4.9 07/13/2016   CL 102 07/13/2016   CALCIUM 9.5 07/13/2016   MG 2.2 07/13/2016   Neuropathy Markers Lab Results  Component Value Date   VITAMINB12 >2000 (H) 07/13/2016   Bone Pathology Markers Lab Results  Component Value Date   ALKPHOS 40 07/13/2016   25OHVITD1 WILL FOLLOW 07/13/2016   25OHVITD2 WILL FOLLOW 07/13/2016   25OHVITD3 WILL FOLLOW 07/13/2016   CALCIUM 9.5 07/13/2016   Coagulation Parameters Lab Results  Component Value Date   INR 1.13 12/15/2011   LABPROT 14.3 12/15/2011   APTT 35 12/14/2011   PLT 258 07/13/2016   Cardiovascular Markers Lab Results  Component Value Date   HGB 13.0 07/13/2016   HCT 39.0 07/13/2016   Note: Lab results reviewed.  PFSH  Drug: Ms. Meine  reports that she does not use drugs. Alcohol:  reports that she does not drink alcohol. Tobacco:   reports that she has never smoked. She has never used smokeless tobacco. Medical:  has a past medical history of Anxiety (12/14/2011); Asthma; CHF (congestive heart failure) ([place]); Factor 5 Leiden mutation, heterozygous ([place]); Fibromyalgia; Fracture of 5th metatarsal (03/2011); Heart palpitations; Interstitial cystitis; Ovarian cyst; Pancreatitis; Pelvic floor dysfunction; Rheumatoid arteritis; Sinus infection; and Thrombosis (12/14/2011). Family: family history includes Heart Problems in her mother; Vascular Disease in her maternal grandmother and mother.  Past Surgical History:  Procedure Laterality Date  . ABDOMINAL HYSTERECTOMY    . CHOLECYSTECTOMY    . CYSTOSCOPY WITH HYDRODISTENSION AND BIOPSY  04/21/2011   Dr Philis Fendt  . LAPAROSCOPIC ASSISTED VAGINAL HYSTERECTOMY  08/21/2011   Procedure: LAPAROSCOPIC ASSISTED VAGINAL HYSTERECTOMY;  Surgeon: Cyril Mourning, MD;  Location: [facility];  Service: Gynecology;  Laterality: N/A;  OPEN LAPAROSCOPIC  . LAPAROSCOPIC NISSEN FUNDOPLICATION  7564  . SALPINGOOPHORECTOMY  08/21/2011   Procedure: SALPINGO OOPHERECTOMY;  Surgeon: Cyril Mourning, MD;  Location: [facility];  Service: Gynecology;  Laterality: Bilateral;  . TUBAL LIGATION  2010  . WISDOM TOOTH EXTRACTION     Active Ambulatory Problems    Diagnosis Date Noted  . Abdominal pain 03/14/2010  . Acute DVT (deep venous thrombosis)-right gonadal 12/14/2011  . SOB (shortness of breath) on exertion 12/14/2011  . Factor V Leiden mutation ([place]) 12/14/2011  . Anxiety 12/14/2011  . Fibromyalgia 12/14/2011  . Chronic interstitial cystitis 12/15/2011  . Hematuria 12/15/2011  . Sensory disturbance 12/16/2011  . Thrombosis 12/14/2011  . Heart palpitations   . Varicose veins of lower extremity 12/17/2015  . Atypical chest pain 02/04/2016  . Diastolic dysfunction 33/29/5188  . Allergic rhinitis 01/21/2013  . Anemia 11/05/2014  . Arthritis 05/27/2015  . Asthma 04/23/2014  . At risk for  polypharmacy 05/27/2015  . Bladder spasms 04/13/2013  . Breast cyst 08/28/2014  . Chronic mastitis 05/27/2015  . Chronic hand pain 06/12/2016  . Cyst of breast 05/27/2015  . Cyst of ovary 05/27/2015  . Thromboembolism of deep veins of lower extremity ([place]) 12/14/2011  . Skin sensation disturbance 12/16/2011  . Female stress incontinence 03/06/2012  . Gastric reflux 05/27/2015  . Headache 08/27/2013  . Hematuria syndrome 05/27/2015  . Hemorrhoid 10/05/2015  . High-tone pelvic floor dysfunction 10/09/2012  . History of  migraine 05/27/2015  . History of migraine headaches 10/27/2010  . Hypercoagulable state ([place]) 05/27/2015  . Increased frequency of urination 05/27/2015  . Inflammatory arthritis 06/12/2016  . Irritable bowel syndrome with diarrhea 10/27/2010  . Mastitis chronic 08/25/2014  . Medication management 11/19/2013  . Nasal septal deviation 12/10/2012  . Ovarian cyst, left 01/02/2015  . Numbness 08/27/2013  . Pelvic floor dysfunction 05/27/2015  . Polypharmacy 09/22/2014  . Primary hypercoagulable state ([place]) 12/14/2011  . Rectocele 05/26/2014  . Rectocele, baby delivered 05/27/2015  . Sinusitis 12/10/2012  . Spasm of bladder 05/27/2015  . Venous thrombosis of lower extremity 05/27/2015  . 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 Vaginal pain 10/27/2010  . Venous embolism 05/27/2015  . Long term current use of opiate analgesic 07/13/2016  . Chronic pain syndrome 07/13/2016  . Opiate use 07/13/2016  . Long term prescription opiate use 07/13/2016  . Neck pain 07/13/2016  . Knee pain, chronic 07/13/2016   Resolved Ambulatory Problems    Diagnosis Date Noted  . No Resolved Ambulatory Problems   Past Medical History:  Diagnosis Date  . Anxiety 12/14/2011  . Asthma   . CHF (congestive heart failure) ([place])   . Factor 5 Leiden mutation, heterozygous ([place])   . Fibromyalgia   . Fracture of 5th metatarsal 03/2011  . Heart palpitations   . Interstitial cystitis   . Ovarian cyst    . Pancreatitis   . Pelvic floor dysfunction   . Rheumatoid arteritis   . Sinus infection   . Thrombosis 12/14/2011   Constitutional Exam  General appearance: Well nourished, well developed, and well hydrated. In no apparent acute distress Vitals:   07/13/16 0913  BP: 134/64  Pulse: 90  Resp: 18  Temp: 98.7 F (37.1 C)  TempSrc: Oral  SpO2: 100%  Weight: 197 lb (89.4 kg)  Height: '5\' 8"'$  (1.727 m)   BMI Assessment: Estimated body mass index is 29.95 kg/m as calculated from the following:   Height as of this encounter: '5\' 8"'$  (1.727 m).   Weight as of this encounter: 197 lb (89.4 kg).  BMI interpretation table: BMI level Category Range association with higher incidence of chronic pain  <18 kg/m2 Underweight   18.5-24.9 kg/m2 Ideal body weight   25-29.9 kg/m2 Overweight Increased incidence by 20%  30-34.9 kg/m2 Obese (Class I) Increased incidence by 68%  35-39.9 kg/m2 Severe obesity (Class II) Increased incidence by 136%  >40 kg/m2 Extreme obesity (Class III) Increased incidence by 254%   BMI Readings from Last 4 Encounters:  07/13/16 29.95 kg/m  04/14/16 33.78 kg/m  03/14/16 32.28 kg/m  02/15/16 32.45 kg/m   Wt Readings from Last 4 Encounters:  07/13/16 197 lb (89.4 kg)  04/14/16 203 lb (92.1 kg)  03/14/16 194 lb (88 kg)  02/15/16 195 lb (88.5 kg)  Psych/Mental status: Alert, oriented x 3 (person, place, & time)       Eyes: PERLA Respiratory: No evidence of acute respiratory distress  Cervical Spine Exam  Inspection: No masses, redness, or swelling Alignment: Symmetrical Functional ROM: Unrestricted ROM      Stability: No instability detected Muscle strength & Tone: Functionally intact Sensory: Unimpaired Palpation: Non-tender              Upper Extremity (UE) Exam    Side: Right upper extremity  Side: Left upper extremity  Inspection: No masses, redness, swelling, or asymmetry. No contractures  Inspection: No masses, redness, swelling, or asymmetry. No  contractures  Functional ROM: Unrestricted ROM  Functional ROM: Unrestricted ROM          Muscle strength & Tone: Functionally intact  Muscle strength & Tone: Functionally intact  Sensory: Unimpaired  Sensory: Unimpaired  Palpation: No palpable anomalies              Palpation: No palpable anomalies              Specialized Test(s): Deferred         Specialized Test(s): Deferred          Thoracic Spine Exam  Inspection: No masses, redness, or swelling Alignment: Symmetrical Functional ROM: Unrestricted ROM Stability: No instability detected Sensory: Unimpaired Muscle strength & Tone: Non-tender  Lumbar Spine Exam  Inspection: No masses, redness, or swelling Alignment: Symmetrical Functional ROM: Unrestricted ROM      Stability: No instability detected Muscle strength & Tone: Functionally intact Sensory: Unimpaired Palpation: Non-tender       Provocative Tests: Lumbar Hyperextension and rotation test: Negative       Patrick's Maneuver: Negative                    Gait & Posture Assessment  Ambulation: Unassisted Gait: Relatively normal for age and body habitus Posture: WNL   Lower Extremity Exam    Side: Right lower extremity  Side: Left lower extremity  Inspection: No masses, redness, swelling, or asymmetry. No contractures  Inspection: No masses, redness, swelling, or asymmetry. No contractures  Functional ROM: Unrestricted ROM          Functional ROM: Unrestricted ROM          Muscle strength & Tone: Functionally intact  Muscle strength & Tone: Functionally intact  Sensory: Unimpaired  Sensory: Unimpaired  Palpation: No palpable anomalies  Palpation: No palpable anomalies   Assessment  Primary Diagnosis & Pertinent Problem List: The primary encounter diagnosis was Fibromyalgia. Diagnoses of Opiate use, Chronic pain syndrome, Long term current use of opiate analgesic, Long term prescription opiate use, Neck pain, Chronic pain of both knees, and Chronic hand pain,  unspecified laterality were also pertinent to this visit.  Visit Diagnosis: 1. Fibromyalgia   2. Opiate use   3. Chronic pain syndrome   4. Long term current use of opiate analgesic   5. Long term prescription opiate use   6. Neck pain   7. Chronic pain of both knees   8. Chronic hand pain, unspecified laterality    Plan of Care  Initial treatment plan:  Please be advised that as per protocol, today's visit has been an evaluation only. We have not taken over the patient's controlled substance management.  Problem-specific plan: No problem-specific Assessment & Plan notes found for this encounter.  Ordered Lab-work, Procedure(s), Referral(s), & Consult(s): Orders Placed This Encounter  Procedures  . DG Cervical Spine Complete  . DG Knee 1-2 Views Left  . DG Hand 2 View Left  . DG Hand 2 View Right  . Compliance Drug Analysis, Ur  . ANA Comprehensive Panel  . CBC with Differential/Platelet  . Comprehensive metabolic panel  . C-reactive protein  . Magnesium  . Sedimentation rate  . Vitamin B12  . 25-Hydroxyvitamin D Lcms D2+D3  . Ambulatory referral to Psychology  . Amb ref to Medical Nutrition Therapy-MNT   Pharmacotherapy: Medications ordered:  No orders of the defined types were placed in this encounter.  Medications administered during this visit: Ms. Blas had no medications administered during this visit.   Pharmacotherapy under consideration:  Opioid Analgesics: The  patient was informed that there is no guarantee that she would be a candidate for opioid analgesics. The decision will be made following CDC guidelines. This decision will be based on the results of diagnostic studies, as well as Ms. Tomei's risk profile. Patient was started on Suboxone by [facility] pain clinic. Patient cut fentanyl patches to use after being prescribed Suboxone. Membrane stabilizer: To be determined at a later time Muscle relaxant: To be determined at a later time NSAID: To be determined  at a later time Other analgesic(s): To be determined at a later time   Interventional therapies under consideration: Ms. Pohl was informed that there is no guarantee that she would be a candidate for interventional therapies. The decision will be based on the results of diagnostic studies, as well as Ms. Corsino's risk profile.  Possible procedure(s): Diagnostic Occipital nerve block,  diagnostic bilateral Lumbar epidural steroid injection,  diagnostic bilateral lumbar facet injection.,  Lumbar radiofrequency ablation Diagnostic bilateral intra-articular knee injection    Provider-requested follow-up: Return for 2nd Visit, w/ Dr. Dossie Arbour, after MedPsych eval.  Future Appointments Date Time Provider [place]  08/08/2016 10:45 AM Edrick Kins, DPM [facility]  08/16/2016 3:30 PM Rogelia Mire, NP [facility]    Primary Care Physician: Jodi Marble, MD Location: [facility] Note by:  Date: 07/13/2016; Time: 9:43 AM  Pain Score Disclaimer: We use the NRS-11 scale. This is a self-reported, subjective measurement of pain severity with only modest accuracy. It is used primarily to identify changes within a particular patient. It must be understood that outpatient pain scales are significantly less accurate that those used for research, where they can be applied under ideal controlled circumstances with minimal exposure to variables. In reality, the score is likely to be a combination of pain intensity and pain affect, where pain affect describes the degree of emotional arousal or changes in action readiness caused by the sensory experience of pain. Factors such as social and work situation, setting, emotional state, anxiety levels, expectation, and prior pain experience may influence pain perception and show large inter-individual differences that may also be affected by time variables.  Patient instructions provided during  this appointment: Patient Instructions   ____________________________________________________________________________________________  Appointment Policy  It is our goal and responsibility to provide the medical community with assistance in the evaluation and management of patients with chronic pain. Unfortunately our resources are limited. Because we do not have an unlimited amount of time, or available appointments, we are required to closely monitor and manage their use. The following rules exist to maximize their use:  Patient's responsibilities: 1. Punctuality: You are required to be physically present and registered in our facility at least 30 minutes before your appointment. 2. Tardiness: The cutoff is your appointment time. If you have an appointment scheduled for 10:00 AM and you arrive at 10:01, you will be required to reschedule your appointment.  3. Plan ahead: Always assume that you will encounter traffic on your way in. Plan for it. If you are dependent on a driver, make sure they understand these rules and the need to arrive early. 4. Other appointments and responsibilities: Avoid scheduling any other appointments before or after your pain clinic appointments.  5. Be prepared: Write down everything that you need to discuss with your healthcare provider and give this information to the admitting nurse. Write down the medications that you will need refilled. Bring your pills and bottles (even the empty ones), to all of  your appointments, except for those where a procedure is scheduled. 6. No children or pets: Find someone to take care of them. It is not appropriate to bring them in. 7. Scheduling changes: We request "advanced notification" of any changes or cancellations. 8. Advanced notification: Defined as a time period of more than 24 hours prior to the originally scheduled appointment. This allows for the appointment to be offered to other patients. 9. Rescheduling: When a visit is  rescheduled, it will require the cancellation of the original appointment. For this reason they both fall within the category of "Cancellations".  10. Cancellations: They require advanced notification. Any cancellation less than 24 hours before the  appointment will be recorded as a "No Show". 11. No Show: Defined as an unkept appointment where the patient failed to notify or declare to the practice their intention or inability to keep the appointment.  Corrective process for repeat offenders:  1. Tardiness: Three (3) episodes of rescheduling due to late arrivals will be recorded as one (1) "No Show". 2. Cancellation or reschedule: Three (3) cancellations or rescheduling will be recorded as one (1) "No Show". 3. "No Shows": Three (3) "No Shows" within a 12 month period will result in discharge from the practice.  ____________________________________________________________________________________________   ____________________________________________________________________________________________  Medication Rules  Applies to: All patients receiving prescriptions (written or electronic).  Pharmacy of record: Pharmacy where electronic prescriptions will be sent. If written prescriptions are taken to a different pharmacy, please inform the nursing staff. The pharmacy listed in the electronic medical record should be the one where you would like electronic prescriptions to be sent.  Prescription refills: Only during scheduled appointments. Applies to both, written and electronic prescriptions.  NOTE: The following applies primarily to controlled substances (Opioid Pain Medications)  Patient's responsibilities: 1. Pain Pills: Bring all pain pills to every appointment (except for procedure appointments). 2. Pill Bottles: Bring pills in original pharmacy bottle. Always bring newest bottle. Bring bottle, even if empty. 3. Medication refills: You are responsible for knowing and keeping track of  what medications you need refilled. The day before your appointment, write a list of all prescriptions that need to be refilled. Bring that list to your appointment and give it to the admitting nurse. Prescriptions will be written only during appointments. If you forget a medication, it will not be "Called in", "Faxed", or "electronically sent". You will need to get another appointment to get these prescribed. 4. Prescription Accuracy: You are responsible for carefully inspecting your prescriptions before leaving our office. Have the discharge nurse carefully go over each prescription with you, before taking them home. Make sure that your name is accurately spelled, that your address is correct. Check the name and dose of your medication to make sure it is accurate. Check the number of pills, and the written instructions to make sure they are clear and accurate. Make sure that you are given enough medication to last until your next medication refill appointment. 5. Taking Medication: Take medication as prescribed. Never take more pills than instructed. Never take medication more frequently than prescribed. Taking less pills or less frequently is permitted and encouraged, when it comes to controlled substances (written prescriptions).  6. Inform other Doctors: Always inform, all of your healthcare providers, of all the medications you take. 7. Pain Medication from other Providers: You are not allowed to accept any additional pain medication from any other Doctor or Healthcare provider. There are two exceptions to this rule. (see below) In the event that  you require additional pain medication, you are responsible for notifying us, as stated below. 8. Medication Agreement: You are responsible for carefully reading and following our Medication Agreement. This must be signed before receiving any prescriptions from our practice. Safely store a copy of your signed Agreement. Violations to the Agreement will result in  no further prescriptions. (Additional copies of our Medication Agreement are available upon request.) 9. Laws, Rules, & Regulations: All patients are expected to follow all Federal and [employer], Rules, [employer]. Ignorance of the Laws does not constitute a valid excuse.  Exceptions: There are only two exceptions to the rule of not receiving pain medications from other Healthcare Providers. 1. Exception #1 (Emergencies): In the event of an emergency (i.e.: accident requiring emergency care), you are allowed to receive additional pain medication. However, you are responsible for: As soon as you are able, call our office [phone], at any time of the day or night, and leave a message stating your name, the date and nature of the emergency, and the name and dose of the medication prescribed. In the event that your call is answered by a member of our staff, make sure to document and save the date, time, and the name of the person that took your information.  2. Exception #2 (Planned Surgery): In the event that you are scheduled by another doctor or dentist to have any type of surgery or procedure, you are allowed (for a period no longer than 30 days), to receive additional pain medication, for the acute post-op pain. However, in this case, you are responsible for picking up a copy of our "Post-op Pain Management for Surgeons" handout, and giving it to your surgeon or dentist. This document is available at our office, and does not require an appointment to obtain it. Simply go to our office during business hours (Monday-Thursday from 8:00 AM to 4:00 PM) (Friday 8:00 AM to 12:00 Noon) or if you have a scheduled appointment with [place], prior to your surgery, and ask for it by name. In addition, you will need to provide [place] with your name, name of your surgeon, type of surgery, and date of procedure or surgery.  Please get your x-rays done as soon as  possible.  ____________________________________________________________________________________________

## 2016-07-13 ENCOUNTER — Ambulatory Visit
Admission: RE | Admit: 2016-07-13 | Discharge: 2016-07-13 | Disposition: A | Discharge status: Home / Self Care | Payer: Medicare Other | Source: Ambulatory Visit | Attending: Nurse Practitioner | Admitting: Nurse Practitioner

## 2016-07-13 ENCOUNTER — Encounter: Payer: Self-pay | Admitting: Nurse Practitioner

## 2016-07-13 ENCOUNTER — Ambulatory Visit
Admission: RE | Admit: 2016-07-13 | Discharge: 2016-07-13 | Disposition: A | Discharge status: Home / Self Care | Payer: Medicare Other | Source: Ambulatory Visit | Attending: Pain Medicine | Admitting: Pain Medicine

## 2016-07-13 ENCOUNTER — Ambulatory Visit: Payer: Medicare Other | Attending: Nurse Practitioner | Admitting: Nurse Practitioner

## 2016-07-13 ENCOUNTER — Other Ambulatory Visit: Payer: Self-pay | Admitting: Nurse Practitioner

## 2016-07-13 VITALS — BP 134/64 | HR 90 | Temp 98.7°F | Resp 18 | Ht 68.0 in | Wt 197.0 lb

## 2016-07-13 DIAGNOSIS — M25562 Pain in left knee: Secondary | ICD-10-CM | POA: Insufficient documentation

## 2016-07-13 DIAGNOSIS — M25561 Pain in right knee: Secondary | ICD-10-CM | POA: Diagnosis not present

## 2016-07-13 DIAGNOSIS — F419 Anxiety disorder, unspecified: Secondary | ICD-10-CM | POA: Insufficient documentation

## 2016-07-13 DIAGNOSIS — G8929 Other chronic pain: Secondary | ICD-10-CM

## 2016-07-13 DIAGNOSIS — M542 Cervicalgia: Secondary | ICD-10-CM

## 2016-07-13 DIAGNOSIS — I509 Heart failure, unspecified: Secondary | ICD-10-CM | POA: Insufficient documentation

## 2016-07-13 DIAGNOSIS — G894 Chronic pain syndrome: Secondary | ICD-10-CM | POA: Diagnosis not present

## 2016-07-13 DIAGNOSIS — M79643 Pain in unspecified hand: Secondary | ICD-10-CM | POA: Insufficient documentation

## 2016-07-13 DIAGNOSIS — Z9049 Acquired absence of other specified parts of digestive tract: Secondary | ICD-10-CM | POA: Insufficient documentation

## 2016-07-13 DIAGNOSIS — F1190 Opioid use, unspecified, uncomplicated: Secondary | ICD-10-CM

## 2016-07-13 DIAGNOSIS — Z90710 Acquired absence of both cervix and uterus: Secondary | ICD-10-CM | POA: Insufficient documentation

## 2016-07-13 DIAGNOSIS — M797 Fibromyalgia: Secondary | ICD-10-CM | POA: Diagnosis not present

## 2016-07-13 DIAGNOSIS — Z9889 Other specified postprocedural states: Secondary | ICD-10-CM | POA: Diagnosis not present

## 2016-07-13 DIAGNOSIS — Z9851 Tubal ligation status: Secondary | ICD-10-CM | POA: Diagnosis not present

## 2016-07-13 DIAGNOSIS — Z79891 Long term (current) use of opiate analgesic: Secondary | ICD-10-CM

## 2016-07-13 DIAGNOSIS — M25569 Pain in unspecified knee: Secondary | ICD-10-CM

## 2016-07-13 NOTE — Patient Instructions (Addendum)
____________________________________________________________________________________________  Appointment Policy  It is our goal and responsibility to provide the medical community with assistance in the evaluation and management of patients with chronic pain. Unfortunately our resources are limited. Because we do not have an unlimited amount of time, or available appointments, we are required to closely monitor and manage their use. The following rules exist to maximize their use:  Patient's responsibilities: 1. Punctuality: You are required to be physically present and registered in our facility at least 30 minutes before your appointment. 2. Tardiness: The cutoff is your appointment time. If you have an appointment scheduled for 10:00 AM and you arrive at 10:01, you will be required to reschedule your appointment.  3. Plan ahead: Always assume that you will encounter traffic on your way in. Plan for it. If you are dependent on a driver, make sure they understand these rules and the need to arrive early. 4. Other appointments and responsibilities: Avoid scheduling any other appointments before or after your pain clinic appointments.  5. Be prepared: Write down everything that you need to discuss with your healthcare provider and give this information to the admitting nurse. Write down the medications that you will need refilled. Bring your pills and bottles (even the empty ones), to all of your appointments, except for those where a procedure is scheduled. 6. No children or pets: Find someone to take care of them. It is not appropriate to bring them in. 7. Scheduling changes: We request "advanced notification" of any changes or cancellations. 8. Advanced notification: Defined as a time period of more than 24 hours prior to the originally scheduled appointment. This allows for the appointment to be offered to other patients. 9. Rescheduling: When a visit is rescheduled, it will require the  cancellation of the original appointment. For this reason they both fall within the category of "Cancellations".  10. Cancellations: They require advanced notification. Any cancellation less than 24 hours before the  appointment will be recorded as a "No Show". 11. No Show: Defined as an unkept appointment where the patient failed to notify or declare to the practice their intention or inability to keep the appointment.  Corrective process for repeat offenders:  1. Tardiness: Three (3) episodes of rescheduling due to late arrivals will be recorded as one (1) "No Show". 2. Cancellation or reschedule: Three (3) cancellations or rescheduling will be recorded as one (1) "No Show". 3. "No Shows": Three (3) "No Shows" within a 12 month period will result in discharge from the practice.  ____________________________________________________________________________________________   ____________________________________________________________________________________________  Medication Rules  Applies to: All patients receiving prescriptions (written or electronic).  Pharmacy of record: Pharmacy where electronic prescriptions will be sent. If written prescriptions are taken to a different pharmacy, please inform the nursing staff. The pharmacy listed in the electronic medical record should be the one where you would like electronic prescriptions to be sent.  Prescription refills: Only during scheduled appointments. Applies to both, written and electronic prescriptions.  NOTE: The following applies primarily to controlled substances (Opioid Pain Medications)  Patient's responsibilities: 1. Pain Pills: Bring all pain pills to every appointment (except for procedure appointments). 2. Pill Bottles: Bring pills in original pharmacy bottle. Always bring newest bottle. Bring bottle, even if empty. 3. Medication refills: You are responsible for knowing and keeping track of what medications you need  refilled. The day before your appointment, write a list of all prescriptions that need to be refilled. Bring that list to your appointment and give it to the  admitting nurse. Prescriptions will be written only during appointments. If you forget a medication, it will not be "Called in", "Faxed", or "electronically sent". You will need to get another appointment to get these prescribed. 4. Prescription Accuracy: You are responsible for carefully inspecting your prescriptions before leaving our office. Have the discharge nurse carefully go over each prescription with you, before taking them home. Make sure that your name is accurately spelled, that your address is correct. Check the name and dose of your medication to make sure it is accurate. Check the number of pills, and the written instructions to make sure they are clear and accurate. Make sure that you are given enough medication to last until your next medication refill appointment. 5. Taking Medication: Take medication as prescribed. Never take more pills than instructed. Never take medication more frequently than prescribed. Taking less pills or less frequently is permitted and encouraged, when it comes to controlled substances (written prescriptions).  6. Inform other Doctors: Always inform, all of your healthcare providers, of all the medications you take. 7. Pain Medication from other Providers: You are not allowed to accept any additional pain medication from any other Doctor or Healthcare provider. There are two exceptions to this rule. (see below) In the event that you require additional pain medication, you are responsible for notifying us, as stated below. 8. Medication Agreement: You are responsible for carefully reading and following our Medication Agreement. This must be signed before receiving any prescriptions from our practice. Safely store a copy of your signed Agreement. Violations to the Agreement will result in no further prescriptions.  (Additional copies of our Medication Agreement are available upon request.) 9. Laws, Rules, & Regulations: All patients are expected to follow all Federal and [employer], Rules, [employer]. Ignorance of the Laws does not constitute a valid excuse.  Exceptions: There are only two exceptions to the rule of not receiving pain medications from other Healthcare Providers. 1. Exception #1 (Emergencies): In the event of an emergency (i.e.: accident requiring emergency care), you are allowed to receive additional pain medication. However, you are responsible for: As soon as you are able, call our office [phone], at any time of the day or night, and leave a message stating your name, the date and nature of the emergency, and the name and dose of the medication prescribed. In the event that your call is answered by a member of our staff, make sure to document and save the date, time, and the name of the person that took your information.  2. Exception #2 (Planned Surgery): In the event that you are scheduled by another doctor or dentist to have any type of surgery or procedure, you are allowed (for a period no longer than 30 days), to receive additional pain medication, for the acute post-op pain. However, in this case, you are responsible for picking up a copy of our "Post-op Pain Management for Surgeons" handout, and giving it to your surgeon or dentist. This document is available at our office, and does not require an appointment to obtain it. Simply go to our office during business hours (Monday-Thursday from 8:00 AM to 4:00 PM) (Friday 8:00 AM to 12:00 Noon) or if you have a scheduled appointment with [place], prior to your surgery, and ask for it by name. In addition, you will need to provide [place] with your name, name of your surgeon, type of surgery, and date of procedure or surgery.  Please get your x-rays done as soon  as  possible.  ____________________________________________________________________________________________

## 2016-07-13 NOTE — Progress Notes (Signed)
Safety precautions to be maintained throughout the outpatient stay will include: orient to surroundings, keep bed in low position, maintain call bell within reach at all times, provide assistance with transfer out of bed and ambulation.  

## 2016-07-18 ENCOUNTER — Ambulatory Visit: Payer: Medicare Other | Admitting: Podiatry

## 2016-07-21 LAB — COMPREHENSIVE METABOLIC PANEL
A/G RATIO: 1.9 (ref 1.2–2.2)
ALK PHOS: 40 IU/L (ref 39–117)
ALT: 35 IU/L — AB (ref 0–32)
AST: 18 IU/L (ref 0–40)
Albumin: 4.5 g/dL (ref 3.5–5.5)
BUN / CREAT RATIO: 6 — AB (ref 9–23)
BUN: 5 mg/dL — AB (ref 6–24)
CHLORIDE: 102 mmol/L (ref 96–106)
CO2: 22 mmol/L (ref 20–29)
Calcium: 9.5 mg/dL (ref 8.7–10.2)
Creatinine, Ser: 0.77 mg/dL (ref 0.57–1.00)
GFR calc Af Amer: 109 mL/min/{1.73_m2} (ref >59)
GFR calc non Af Amer: 94 mL/min/{1.73_m2} (ref >59)
GLUCOSE: 92 mg/dL (ref 65–99)
Globulin, Total: 2.4 g/dL (ref 1.5–4.5)
POTASSIUM: 4.9 mmol/L (ref 3.5–5.2)
Sodium: 142 mmol/L (ref 134–144)
Total Protein: 6.9 g/dL (ref 6.0–8.5)

## 2016-07-21 LAB — CBC WITH DIFFERENTIAL/PLATELET
BASOS ABS: 0 10*3/uL (ref 0.0–0.2)
Basos: 0 %
EOS (ABSOLUTE): 0.1 10*3/uL (ref 0.0–0.4)
Eos: 2 %
Hematocrit: 39 % (ref 34.0–46.6)
Hemoglobin: 13 g/dL (ref 11.1–15.9)
Immature Grans (Abs): 0 10*3/uL (ref 0.0–0.1)
Immature Granulocytes: 0 %
LYMPHS ABS: 2 10*3/uL (ref 0.7–3.1)
Lymphs: 29 %
MCH: 31.3 pg (ref 26.6–33.0)
MCHC: 33.3 g/dL (ref 31.5–35.7)
MCV: 94 fL (ref 79–97)
MONOS ABS: 0.5 10*3/uL (ref 0.1–0.9)
Monocytes: 7 %
NEUTROS ABS: 4.3 10*3/uL (ref 1.4–7.0)
Neutrophils: 62 %
PLATELETS: 258 10*3/uL (ref 150–379)
RBC: 4.15 x10E6/uL (ref 3.77–5.28)
RDW: 15.1 % (ref 12.3–15.4)
WBC: 6.8 10*3/uL (ref 3.4–10.8)

## 2016-07-21 LAB — 25-HYDROXYVITAMIN D LCMS D2+D3
25-HYDROXY, VITAMIN D-3: 39 ng/mL
25-HYDROXY, VITAMIN D: 39 ng/mL

## 2016-07-21 LAB — ANA W/REFLEX IF POSITIVE: ANA: NEGATIVE

## 2016-07-21 LAB — VITAMIN B12: Vitamin B-12: >2000 pg/mL — ABNORMAL HIGH (ref 232–1245)

## 2016-07-21 LAB — 25-HYDROXY VITAMIN D LCMS D2+D3: 25-Hydroxy, Vitamin D-2: <1 ng/mL

## 2016-07-21 LAB — COMPLIANCE DRUG ANALYSIS, UR

## 2016-07-21 LAB — C-REACTIVE PROTEIN: CRP: 3.2 mg/L (ref 0.0–4.9)

## 2016-07-21 LAB — SEDIMENTATION RATE: SED RATE: 3 mm/h (ref 0–32)

## 2016-07-21 LAB — MAGNESIUM: Magnesium: 2.2 mg/dL (ref 1.6–2.3)

## 2016-07-24 ENCOUNTER — Encounter: Payer: Self-pay | Admitting: Nurse Practitioner

## 2016-07-24 DIAGNOSIS — M545 Low back pain, unspecified: Secondary | ICD-10-CM | POA: Insufficient documentation

## 2016-07-24 DIAGNOSIS — M79671 Pain in right foot: Secondary | ICD-10-CM | POA: Insufficient documentation

## 2016-07-24 DIAGNOSIS — G8929 Other chronic pain: Secondary | ICD-10-CM | POA: Insufficient documentation

## 2016-07-24 DIAGNOSIS — R51 Headache: Secondary | ICD-10-CM

## 2016-07-24 NOTE — Progress Notes (Signed)
Results were reviewed and found to be: mildly abnormal  No acute injury or pathology identified  Review would suggest interventional pain management techniques may be of benefit 

## 2016-08-08 ENCOUNTER — Ambulatory Visit: Payer: Medicare Other | Admitting: Podiatry

## 2016-08-11 ENCOUNTER — Ambulatory Visit: Payer: Medicare Other | Admitting: Podiatry

## 2016-08-15 ENCOUNTER — Ambulatory Visit (INDEPENDENT_AMBULATORY_CARE_PROVIDER_SITE_OTHER): Payer: Medicare Other | Admitting: Podiatry

## 2016-08-15 ENCOUNTER — Ambulatory Visit (INDEPENDENT_AMBULATORY_CARE_PROVIDER_SITE_OTHER): Payer: Medicare Other

## 2016-08-15 DIAGNOSIS — S99191K Other physeal fracture of right metatarsal, subsequent encounter for fracture with nonunion: Secondary | ICD-10-CM

## 2016-08-15 NOTE — Progress Notes (Signed)
   HPI: Patient presents today for follow-up treatment and evaluation of a fifth metatarsal fracture to the right foot with delayed union. Patient states that she's feeling much better and she received the exigent external bone stimulator. She is been using a daily. Patient recently sustained a slip and fall injury injuring her right hip shoulder and head. Patient states she is stable and improving.   Physical Exam: General: The patient is alert and oriented x3 in no acute distress.  Dermatology: Skin is warm, dry and supple bilateral lower extremities. Negative for open lesions or macerations.  Vascular: Palpable pedal pulses bilaterally. No edema or erythema noted. Capillary refill within normal limits.  Neurological: Epicritic and protective threshold grossly intact bilaterally.   Musculoskeletal Exam: Range of motion within normal limits to all pedal and ankle joints bilateral. Muscle strength 5/5 in all groups bilateral.   Radiographic Exam:  Delayed union noted to the transverse fracture of the fifth metatarsal tubercle right foot. Fracture appears approximately 75% healed. Otherwise stable and nondisplaced.  Assessment: 1. Fracture fifth metatarsal bone right foot- delayed healing  Plan of Care:  1. Patient was evaluated. X-rays reviewed today 2. Continue external bone stimulator 3 months 3. Return to clinic in 3 months for follow-up x-rays   Edrick Kins, DPM [facility]  Dr. Edrick Kins, DPM    [address], [place] [address]                Office [phone]  Fax [phone]

## 2016-08-16 ENCOUNTER — Ambulatory Visit: Payer: Medicare Other | Admitting: Nurse Practitioner

## 2016-09-15 ENCOUNTER — Ambulatory Visit: Payer: Medicare Other | Admitting: Nurse Practitioner

## 2016-10-09 ENCOUNTER — Other Ambulatory Visit: Payer: Self-pay | Admitting: Nurse Practitioner

## 2016-10-10 ENCOUNTER — Encounter: Payer: Self-pay | Admitting: Nurse Practitioner

## 2016-10-10 ENCOUNTER — Ambulatory Visit (INDEPENDENT_AMBULATORY_CARE_PROVIDER_SITE_OTHER): Payer: Medicare Other | Admitting: Nurse Practitioner

## 2016-10-10 VITALS — BP 110/70 | HR 79 | Ht 65.0 in | Wt 203.0 lb

## 2016-10-10 DIAGNOSIS — I519 Heart disease, unspecified: Secondary | ICD-10-CM

## 2016-10-10 DIAGNOSIS — R002 Palpitations: Secondary | ICD-10-CM

## 2016-10-10 DIAGNOSIS — I5189 Other ill-defined heart diseases: Secondary | ICD-10-CM

## 2016-10-10 DIAGNOSIS — R0789 Other chest pain: Secondary | ICD-10-CM | POA: Diagnosis not present

## 2016-10-10 NOTE — Progress Notes (Signed)
Office Visit    Patient Name: April [name] Date of Encounter: 10/10/2016  Primary Care Provider:  Jodi Marble, MD Primary Cardiologist:  Andree Coss, MD   Chief Complaint   46 year old female with a prior history of fibromyalgia, atypical chest pain, factor V Leiden mutation, anemia , and diastolic dysfunction , who presents for folow-up.  Past Medical History    Past Medical History:  Diagnosis Date  . Anxiety 12/14/2011  . Asthma    no inhaler  . Chest pain    a. 12/2015 CTA chest: no PE. No significant coronary Ca2+. Mosaic attenuation pattern in both lungs, may reflect small airway dzs; c. 12/2015 Ex MV: Walked 6 mins. No ischemia/scar.  . Diastolic dysfunction    a. 10/2015 Echo: EF nl, mod diast dysfxn. Mild MR/TR.  [name] Factor 5 Leiden mutation, heterozygous ([place])   . Fibromyalgia   . Fracture of 5th metatarsal 03/2011   left foot -    . Heart palpitations    a. Notes intermittent tachycardia (sinus). Did not tolerate beta blockers 2/2 urinary incontinence.  . Interstitial cystitis   . Ovarian cyst   . Pancreatitis   . Pelvic floor dysfunction   . Rheumatoid arteritis   . Sinus infection    chronic  . 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   Past Surgical History:  Procedure Laterality Date  . ABDOMINAL HYSTERECTOMY    . CHOLECYSTECTOMY    . CYSTOSCOPY WITH HYDRODISTENSION AND BIOPSY  04/21/2011   Dr Philis Fendt  . LAPAROSCOPIC ASSISTED VAGINAL HYSTERECTOMY  08/21/2011   Procedure: LAPAROSCOPIC ASSISTED VAGINAL HYSTERECTOMY;  Surgeon: Cyril Mourning, MD;  Location: [facility];  Service: Gynecology;  Laterality: N/A;  OPEN LAPAROSCOPIC  . LAPAROSCOPIC NISSEN FUNDOPLICATION  4315  . SALPINGOOPHORECTOMY  08/21/2011   Procedure: SALPINGO OOPHERECTOMY;  Surgeon: Cyril Mourning, MD;  Location: [facility];  Service: Gynecology;  Laterality: Bilateral;  . TUBAL  LIGATION  2010  . WISDOM TOOTH EXTRACTION      Allergies  Allergies  Allergen Reactions  . Amitriptyline Other (See Comments)    Other reaction(s): Other Change in mental status  . Amoxicillin Rash  . Bromelains     Other reaction(s): Abdominal pain  . Celexa [Citalopram Hydrobromide] Other (See Comments)    Other reaction(s): Other Change in mental status  . Ivp Dye  [Iodinated Diagnostic Agents] Anaphylaxis  . Nortriptyline     Other reaction(s): Other  . Penicillins Swelling and Rash  . Pregabalin     Other reaction(s): Myalgias (muscle pain)  . Bromelains [Pineapple Extract] Other (See Comments)    Severe pain  . Budesonide-Formoterol Fumarate     Other reaction(s): Other  . Citalopram Other (See Comments)  . Mangifera Indica Swelling  . Methylene Blue Other (See Comments)    Caused inflamed pancreas, per pt should not use  . Sertraline Other (See Comments)    Other reaction(s): Other Suicidal thoughts  . Sulfa Antibiotics Other (See Comments)  . Sulfonamide Derivatives Other (See Comments)    excrutiating pain, patient states it caused her to have osteoarthritis  . Bromaline [Albertsons Di Bromm] Rash  . 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 Phebe Colla). She was previously on xarelto, however this was discontinued secondary to severe anemia. Other history includes obesity, asthma, rheumatoid arthritis, urinary incontinence, and multiple drug allergies. She had an echocardiogram in September 2016, which was performed in the setting of dyspnea on exertion. This showed normal LV function with moderate diastolic dysfunction. She was later seen in the cardiology clinic in November 2017 secondary to atypical chest pain. Stress testing  was performed and did not show any evidence of ischemia or scar. She also has a history of palpitations which was previously well managed on metoprolol, though this was perceived to worsen her urinary incontinence. This was later switched to carvedilol, which only worsened urinary incontinence. She has been on diltiazem CD 120 mg daily for the past few months and this is done a reasonably good job at keeping palpitations and they also managing her blood pressure and limiting palpitations. She still has intermittent atypical sharp and fleeting chest pain that might last a minute and resolved spontaneously. She has intermittent dyspnea on exertion without any predictable pattern to when it might occur. She has significant concerns about her history of diastolic dysfunction and questions today when her regarding to start doing something about it. She denies PND, orthopnea, dizziness, syncope, or early satiety. She does occasionally note lower extremity swelling, especially after salty meals.  Home Medications    Prior to Admission medications   Medication Sig Start Date End Date Taking? Authorizing Provider  albuterol (PROVENTIL HFA;VENTOLIN HFA) 108 (90 BASE) MCG/ACT inhaler Inhale 1 puff into the lungs as needed. For shortness of breath or wheezing   Yes [provider]  Azelastine HCl 0.15 % SOLN U 1 TO 2 SPRAYS IEN D 05/31/16  Yes [provider]  baclofen (LIORESAL) 20 MG tablet Take 20 mg by mouth 3 (three) times daily.   Yes [provider]  butalbital-acetaminophen-caffeine (FIORICET WITH CODEINE) 50-325-40-30 MG capsule Take 1 capsule by mouth every 4 (four) hours as needed for headache.   Yes [provider]  butalbital-acetaminophen-caffeine (FIORICET, ESGIC) 50-325-40 MG tablet TK 1 T PO Q 6 H PRN 05/31/16  Yes [provider]  Calcium-Phosphorus-Vitamin D (CALCIUM GUMMIES PO) Take 1 tablet by mouth every evening.   Yes [provider]  CARTIA XT  120 MG 24 hr capsule TAKE 1 CAPSULE(120 MG) BY MOUTH DAILY 06/16/16  Yes End, Harrell Gave, MD  CARTIA XT 120 MG 24 hr capsule TAKE 1 CAPSULE(120 MG) BY MOUTH DAILY 10/09/16  Yes End, Harrell Gave, MD  Cholecalciferol (VITAMIN D3) 5000 units TABS Take by mouth daily.   Yes [provider]  co-enzyme Q-10 30 MG capsule Take 30 mg by mouth daily.   Yes [provider]  Cyanocobalamin 5000 MCG SUBL Place 5,000 mcg under the tongue daily.    Yes [provider]  diazepam (VALIUM) 10 MG tablet Take 10 mg by mouth daily.   Yes [provider]  ELMIRON 100 MG capsule Take 200 mg by mouth 2 (two) times daily. 09/26/16  Yes [provider]  fentaNYL (DURAGESIC - DOSED MCG/HR) 50 MCG/HR Place 50 mcg onto the skin every 3 (three) days.   Yes [provider]  Ferrous Gluconate (IRON 27 PO) Take by mouth daily.   Yes [provider]  Fexofenadine HCl (ALLEGRA PO) Take 1 tablet by mouth daily as needed. For allergies/sinues   Yes [provider]  fluconazole (DIFLUCAN)  150 MG tablet  08/14/16  Yes [provider]  fluticasone (FLONASE) 50 MCG/ACT nasal spray SHAKE LQ AND U 1 TO 2 SPRAYS IEN QD UTD 06/03/16  Yes [provider]  Fluticasone-Salmeterol (ADVAIR) 250-50 MCG/DOSE AEPB Inhale 1 puff into the lungs as needed.   Yes [provider]  furosemide (LASIX) 20 MG tablet Take 20 mg by mouth daily.   Yes [provider]  gabapentin (NEURONTIN) 600 MG tablet Take 300 mg by mouth 4 (four) times daily. 09/26/16  Yes [provider]  guaifenesin (HUMIBID E) 400 MG TABS Take 1,200 mg by mouth 2 (two) times daily as needed. For congestion   Yes [provider]  hydrOXYzine (VISTARIL) 25 MG capsule Take 50 mg by mouth at bedtime.    Yes [provider]  hyoscyamine (LEVBID) 0.375 MG 12 hr tablet TK 1 T PO Q 12 H 05/13/16  Yes [provider]  lidocaine (XYLOCAINE) 5 % ointment Apply  topically daily. 09/26/16  Yes [provider]  mometasone (ELOCON) 0.1 % cream Apply topically as needed. 09/27/16  Yes [provider]  montelukast (SINGULAIR) 10 MG tablet Take 10 mg by mouth at bedtime.   Yes [provider]  Multiple Vitamin (MULITIVITAMIN WITH MINERALS) TABS Take 1 tablet by mouth every evening.    Yes [provider]  pantoprazole (PROTONIX) 40 MG tablet Take 40 mg by mouth daily.   Yes [provider]  PREBIOTIC PRODUCT PO Take by mouth daily.   Yes [provider]  Probiotic Product (ALIGN) 4 MG CAPS Take 1 capsule by mouth daily.   Yes [provider]  PROCTOZONE-HC 2.5 % rectal cream Apply topically as needed. 09/26/16  Yes [provider]  progesterone (PROMETRIUM) 100 MG capsule  08/03/16  Yes [provider]  progesterone (PROMETRIUM) 200 MG capsule 100 mg daily.  06/05/16  Yes [provider]  pseudoephedrine (SUDAFED) 30 MG tablet Take 60 mg by mouth 4 (four) times daily.   Yes [provider]  Simethicone 125 MG CAPS Take 125 mg by mouth 4 (four) times daily.   Yes [provider]  traMADol (ULTRAM) 50 MG tablet Take 50 mg by mouth as needed. 09/26/16  Yes [provider]  valACYclovir (VALTREX) 500 MG tablet TK 1 T PO QD 06/07/16  Yes [provider]  vitamin E (VITAMIN E) 400 UNIT capsule Take 800 Units by mouth daily.   Yes [provider]  Zinc 25 MG TABS Take by mouth daily.   Yes [provider]    Review of Systems    As above, she notes intermittent atypical sharp and fleeting chest pain. She has intermittent and unpredictable dyspnea on exertion. She occasionally notes elevated heart rates and has had elevated heart rates at Drs visits. She has a history of asthma/GERD/fibromyalgia, which she thinks causes many upper chest symptoms.  All other systems reviewed and are otherwise negative except as noted above.  Physical  Exam    VS:  BP 110/70 (BP Location: Left Arm, Patient Position: Sitting, Cuff Size: Normal)   Pulse 79   Ht 5\' 5"  (1.651 m)   Wt 203 lb (92.1 kg)   LMP 05/20/2011   BMI 33.78 kg/m  , BMI Body mass index is 33.78 kg/m. GEN: Obese, no acute distress.  HEENT: normal.  Neck: Supple, no JVD, carotid bruits, or masses. Cardiac: RRR, no murmurs, rubs, or gallops. No clubbing, cyanosis, edema.  Radials/DP/PT 2+ and equal  bilaterally.  Respiratory:  Respirations regular and unlabored, clear to auscultation bilaterally. GI: Soft, nontender, nondistended, BS + x 4. MS: no deformity or atrophy. Skin: warm and dry, no rash. Neuro:  Strength and sensation are intact. Psych: Normal affect.  Accessory Clinical Findings    ECG - Regular sinus rhythm, 79, short PR interval without evidence of preexcitation. No acute ST or T changes.  Assessment & Plan    1.  Palpitations/sinus tachycardia: This currently appears to be well controlled with diltiazem CD 120 mg daily. She has had documented higher rates at other doctor's visits which are likely related to anxiety. She had previously normal thyroid function in 2016. No change to therapy today.  2. Diastolic dysfunction: She has significant concerns about this finding on her echo a year ago. She asked today when we will start doing something about it in order to prevent it from advancing. We discussed that in the absence of significant heart failure symptoms, our main goal for management of diastolic dysfunction is good management of heart rate, blood pressure, and removal of any lifestyle issues that may be contributing, such as obesity, or sleep apnea. She understands that she needs to lose weight and has been trying to increase her activity, however this is been slow in coming in the setting of some setbacks related to a foot fracture a few months ago. She says her diet has been healthy. She has previously been evaluated for sleep apnea and states that she  had a normal sleep study. Heart rate and blood pressure well controlled today. Continue diltiazem CD. I will follow up an echocardiogram as it's been a year since her last one and that was an outside echo. She indicates that there would be some reassurance if we were able to read her echo.  3. Atypical chest pain: Negative Myoview in November 2017 with no evidence of coronary calcification on CT angios of the chest at that time. She continues to have intermittent fleeting and sharp chest pain. No further cardiac evaluation at this time.  4. Morbid obesity: Patient is trying to increase activity and we did discuss the importance of calorie restriction as well.  5. Disposition: Follow-up echocardiogram. So long as that is stable, plan to follow-up in one year or sooner if necessary.   Murray Hodgkins, NP 10/10/2016, 12:38 PM

## 2016-10-10 NOTE — Patient Instructions (Addendum)
Medication Instructions:  Please continue current medications  Labwork: None  Testing/Procedures: Your physician has requested that you have an echocardiogram. Echocardiography is a painless test that uses sound waves to create images of your heart. It provides your doctor with information about the size and shape of your heart and how well your heart's chambers and valves are working. This procedure takes approximately one hour. There are no restrictions for this procedure.  Follow-Up: Your physician wants you to follow-up in: 1 year with Dr. Saunders Revel.  You will receive a reminder letter in the mail two months in advance.  If you don't receive a letter, please call our office to schedule the follow-up appointment.  If you need a refill on your cardiac medications before your next appointment, please call your pharmacy.  Echocardiogram An echocardiogram, or echocardiography, uses sound waves (ultrasound) to produce an image of your heart. The echocardiogram is simple, painless, obtained within a short period of time, and offers valuable information to your health care provider. The images from an echocardiogram can provide information such as:  Evidence of coronary artery disease (CAD).  Heart size.  Heart muscle function.  Heart valve function.  Aneurysm detection.  Evidence of a past heart attack.  Fluid buildup around the heart.  Heart muscle thickening.  Assess heart valve function.  Tell a health care provider about:  Any allergies you have.  All medicines you are taking, including vitamins, herbs, eye drops, creams, and over-the-counter medicines.  Any problems you or family members have had with anesthetic medicines.  Any blood disorders you have.  Any surgeries you have had.  Any medical conditions you have.  Whether you are pregnant or may be pregnant. What happens before the procedure? No special preparation is needed. Eat and drink normally. What happens  during the procedure?  In order to produce an image of your heart, gel will be applied to your chest and a wand-like tool (transducer) will be moved over your chest. The gel will help transmit the sound waves from the transducer. The sound waves will harmlessly bounce off your heart to allow the heart images to be captured in real-time motion. These images will then be recorded.  You may need an IV to receive a medicine that improves the quality of the pictures. What happens after the procedure? You may return to your normal schedule including diet, activities, and medicines, unless your health care provider tells you otherwise. This information is not intended to replace advice given to you by your health care provider. Make sure you discuss any questions you have with your health care provider. Document Released: 01/14/2000 Document Revised: 09/04/2015 Document Reviewed: 09/23/2012 Elsevier Interactive Patient Education  2017 [employer].

## 2016-10-16 ENCOUNTER — Ambulatory Visit (INDEPENDENT_AMBULATORY_CARE_PROVIDER_SITE_OTHER): Payer: Medicare Other

## 2016-10-16 ENCOUNTER — Other Ambulatory Visit: Payer: Self-pay

## 2016-10-16 DIAGNOSIS — I519 Heart disease, unspecified: Secondary | ICD-10-CM | POA: Diagnosis not present

## 2016-10-16 DIAGNOSIS — I5189 Other ill-defined heart diseases: Secondary | ICD-10-CM

## 2016-10-24 ENCOUNTER — Telehealth: Payer: Self-pay | Admitting: Nurse Practitioner

## 2016-10-24 NOTE — Telephone Encounter (Signed)
S/w patient. Confirmed her PCP. Echo report routed to Dr. Elijio Miles. Quick disclosure for echo to be sent to patient completed and mailed to her.

## 2016-10-24 NOTE — Telephone Encounter (Signed)
Pt calling asking if we can please send a copy to her PCP of her Echo she did  Also would like a copy mailed off too her   Please advise

## 2016-11-17 ENCOUNTER — Encounter: Payer: Self-pay | Admitting: Podiatry

## 2016-11-17 ENCOUNTER — Ambulatory Visit (INDEPENDENT_AMBULATORY_CARE_PROVIDER_SITE_OTHER): Payer: Medicare Other | Admitting: Podiatry

## 2016-11-17 ENCOUNTER — Ambulatory Visit (INDEPENDENT_AMBULATORY_CARE_PROVIDER_SITE_OTHER): Payer: Medicare Other

## 2016-11-17 DIAGNOSIS — S99191K Other physeal fracture of right metatarsal, subsequent encounter for fracture with nonunion: Secondary | ICD-10-CM | POA: Diagnosis not present

## 2016-11-20 NOTE — Progress Notes (Signed)
   HPI: Patient presents today for follow-up treatment and evaluation of a fifth metatarsal fracture to the right foot with delayed union. She states she was doing much better with improved pain until last week. She reports intermittent pain with weather changes. She has no new complaints at this time. She is here for further evaluation and treatment.   Past Medical History:  Diagnosis Date  . Anxiety 12/14/2011  . Asthma    no inhaler  . Chest pain    a. 12/2015 CTA chest: no PE. No significant coronary Ca2+. Mosaic attenuation pattern in both lungs, may reflect small airway dzs; c. 12/2015 Ex MV: Walked 6 mins. No ischemia/scar.  . Diastolic dysfunction    a. 10/2015 Echo: EF nl, mod diast dysfxn. Mild MR/TR.  [name] Factor 5 Leiden mutation, heterozygous ([place])   . Fibromyalgia   . Fracture of 5th metatarsal 03/2011   left foot -    . Heart palpitations    a. Notes intermittent tachycardia (sinus). Did not tolerate beta blockers 2/2 urinary incontinence.  . Interstitial cystitis   . Ovarian cyst   . Pancreatitis   . Pelvic floor dysfunction   . Rheumatoid arteritis   . Sinus infection    chronic  . 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      Physical Exam: General: The patient is alert and oriented x3 in no acute distress.  Dermatology: Skin is warm, dry and supple bilateral lower extremities. Negative for open lesions or macerations.  Vascular: Palpable pedal pulses bilaterally. No edema or erythema noted. Capillary refill within normal limits.  Neurological: Epicritic and protective threshold grossly intact bilaterally.   Musculoskeletal Exam: Range of motion within normal limits to all pedal and ankle joints bilateral. Muscle strength 5/5 in all groups bilateral.   Radiographic Exam:  Normal osseous mineralization. Joint spaces preserved. No fracture/dislocation/boney  destruction.    Assessment: 1. Fracture fifth metatarsal bone right foot- resolved  Plan of Care:  1. Patient was evaluated. X-rays reviewed today 2. Recommended OTC arch supports. 3. Full activity with no restrictions. 4. Return to clinic when necessary.   Edrick Kins, DPM [facility]  Dr. Edrick Kins, DPM    [address], [place] [address]                Office [phone]  Fax [phone]

## 2016-12-15 ENCOUNTER — Ambulatory Visit (INDEPENDENT_AMBULATORY_CARE_PROVIDER_SITE_OTHER): Payer: Medicare Other | Admitting: Podiatry

## 2016-12-15 ENCOUNTER — Encounter: Payer: Self-pay | Admitting: Podiatry

## 2016-12-15 ENCOUNTER — Ambulatory Visit: Payer: Medicare Other | Admitting: Podiatry

## 2016-12-15 ENCOUNTER — Ambulatory Visit (INDEPENDENT_AMBULATORY_CARE_PROVIDER_SITE_OTHER): Payer: Medicare Other

## 2016-12-15 DIAGNOSIS — S99191K Other physeal fracture of right metatarsal, subsequent encounter for fracture with nonunion: Secondary | ICD-10-CM | POA: Diagnosis not present

## 2016-12-15 DIAGNOSIS — M2580 Other specified joint disorders, unspecified joint: Secondary | ICD-10-CM

## 2016-12-15 DIAGNOSIS — G8929 Other chronic pain: Secondary | ICD-10-CM

## 2016-12-15 DIAGNOSIS — M79673 Pain in unspecified foot: Secondary | ICD-10-CM

## 2016-12-15 DIAGNOSIS — M7752 Other enthesopathy of left foot: Secondary | ICD-10-CM

## 2016-12-18 NOTE — Progress Notes (Signed)
   HPI: 46 year old female presents today with a complaint of pain to the 1st MPJ of the right foot that began 4 days ago. She states she was standing in her kitchen when she felt a popping sensation in her foot. She reports associated burning, stabbing pain of the area. She has not done anything to treat the symptoms. There are no modifying factors noted. She is here for further evaluation and treatment.     Past Medical History:  Diagnosis Date  . Anxiety 12/14/2011  . Asthma    no inhaler  . Chest pain    a. 12/2015 CTA chest: no PE. No significant coronary Ca2+. Mosaic attenuation pattern in both lungs, may reflect small airway dzs; c. 12/2015 Ex MV: Walked 6 mins. No ischemia/scar.  . Diastolic dysfunction    a. 10/2015 Echo: EF nl, mod diast dysfxn. Mild MR/TR.  [name] Factor 5 Leiden mutation, heterozygous ([place])   . Fibromyalgia   . Fracture of 5th metatarsal 03/2011   left foot -    . Heart palpitations    a. Notes intermittent tachycardia (sinus). Did not tolerate beta blockers 2/2 urinary incontinence.  . Interstitial cystitis   . Ovarian cyst   . Pancreatitis   . Pelvic floor dysfunction   . Rheumatoid arteritis   . Sinus infection    chronic  . 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     Physical Exam: General: The patient is alert and oriented x3 in no acute distress.  Dermatology: Skin is warm, dry and supple bilateral lower extremities. Negative for open lesions or macerations.  Vascular: Palpable pedal pulses bilaterally. No edema or erythema noted. Capillary refill within normal limits.  Neurological: Epicritic and protective threshold grossly intact bilaterally.   Musculoskeletal Exam: .Pain with palpation to the sesamoid apparatus bilaterally. Pain with palpation to the 4th MPJ of the left foot. Range of motion within normal limits to all pedal and ankle joints  bilateral. Muscle strength 5/5 in all groups bilateral.   Radiographic Exam:  Normal osseous mineralization. Joint spaces preserved. No fracture/dislocation/boney destruction.    Assessment: - sesamoiditis bilaterally - 4th MPJ capsulitis left - chronic foot pain bilateral   Plan of Care:  - Patient evaluated. X-Rays reviewed.  - Injection of 0.5 mLs of Celestone Soluspan injected into the sesamoidal apparatus bilaterally. - Injection of 0.5 mLs Celestone Soluspan injected into the 4th MPJ of the left foot. - Orders for physical therapy 3 times weekly for 4 weeks placed today.  - Return to clinic in 8 weeks.   Edrick Kins, DPM [facility]  Dr. Edrick Kins, DPM    [address], [place] [address]                Office [phone]  Fax [phone]

## 2017-01-05 ENCOUNTER — Ambulatory Visit: Payer: Medicare Other | Admitting: Podiatry

## 2017-03-01 ENCOUNTER — Other Ambulatory Visit: Payer: Self-pay | Admitting: Obstetrics and Gynecology

## 2017-03-01 DIAGNOSIS — R928 Other abnormal and inconclusive findings on diagnostic imaging of breast: Secondary | ICD-10-CM

## 2017-03-05 ENCOUNTER — Ambulatory Visit
Admission: RE | Admit: 2017-03-05 | Discharge: 2017-03-05 | Disposition: A | Discharge status: Home / Self Care | Payer: Medicare Other | Source: Ambulatory Visit | Attending: Obstetrics and Gynecology | Admitting: Obstetrics and Gynecology

## 2017-03-05 DIAGNOSIS — R928 Other abnormal and inconclusive findings on diagnostic imaging of breast: Secondary | ICD-10-CM

## 2017-07-09 ENCOUNTER — Telehealth: Payer: Self-pay | Admitting: Internal Medicine

## 2017-07-09 NOTE — Telephone Encounter (Signed)
°  Please call.    Patient c/o Palpitations:  High priority if patient c/o lightheadedness, shortness of breath, or chest pain  1) How long have you had palpitations/irregular HR/ Afib? Are you having the symptoms now? Last month no   2) Are you currently experiencing lightheadedness, SOB or CP?  Interim cp sob lightheaded   3) Do you have a history of afib (atrial fibrillation) or irregular heart rhythm? No   4) Have you checked your BP or HR? (document readings if available):  Strong family hx    5. Are you experiencing any other symptoms?        Patient had a couple of episodes falls and tachycardia and is concerned that this may     be a heart issue.  Patient is not sure if she is due for any testing. Patient wants to know I    f the nurse thinks she needs an appt.

## 2017-07-09 NOTE — Telephone Encounter (Signed)
Called patient.  She explained an episode that occurred in March 2019 where she turned around real quick, HR increased so high that could not be read on machine and lasted 2.5 hours. Her temperature increased as well and her daughters had to put ice packs on her body.  Since then she has not had any more episodes until June 1st and 2nd where her HR increased (cannot give me the readings) and she woke up and had urinated on herself in her sleep. BP was 177/93.   These were isolated events. She is due to see Dr End in September but asked if she could go ahead and schedule to discuss some elevated BP's and HR's. Patient scheduled at next available on 08/08/17. She was very Patent attorney.

## 2017-07-11 ENCOUNTER — Other Ambulatory Visit: Payer: Self-pay | Admitting: Internal Medicine

## 2017-07-26 NOTE — Telephone Encounter (Signed)
Patient has been having more episodes that have her concerned Would like to discuss with nurse to see if she should be seen sooner Is scheduled for 7/10 with Dr. Saunders Revel Please call to discuss

## 2017-07-26 NOTE — Telephone Encounter (Signed)
S/w patient. She would like to be seen sooner if able. Patient rescheduled to 08/01/17 as there was an opening. Patient verbalized understanding of appointment date and time.

## 2017-07-31 NOTE — Progress Notes (Signed)
Follow-up Outpatient Visit Date: 08/01/2017  Primary Care Provider: Jodi Marble, MD [place] [id number]  Chief Complaint: Multiple complaints  HPI:  April [name] is a 47 y.o. 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 Bynum Bellows @ [place]) whowas on Xarelto for this (discontinued due to severe anemia), fibromyalgia, asthma and rheumatoid arthritis, who presents for evaluation of numerous complaints.  I last saw her in 01/2016 for follow-up of chest pain.  Preceding myocardial perfusion stress test was normal.  She continued to have migratory chest pain that she attributed to preceding lipoma resection as well as fibromyalgia.  Palpitations had improved with addition of metoprolol.  She subsequently followed-up with Ignacia Bayley, NP, in September; in the interim she had been switched from metoprolol to diltiazem due to increasing urinary incontinence that she attributed to metoprolol.  Subsequent echo showed normal systolic/diastolic function with trivial MR.  Today, April [name] has multiple complaints.  She has chronic chest pain that she describes as a substernal stabbing pain that comes on randomly and has been present for years.  She has chronic intermittent dyspnea (both at rest and with exertion) that is also unchanged.  She is concerned about progression of mitral valve prolapse and diastolic dysfunction (most recent echo in 09/2016 showed normal mitral valve and LV systolic/diastolic function).  She does not have significant edema but reports longstanding 2-pillow orthopnea.  April [name] reports 3 episodes of near syncope/syncope that occurred in early June.  She initially felt as though she was being pushed backwards while standing at the sink, washing dishes ("as if by the hand of God").  She had a similar sensation while in her bathroom later that night, though she was able to  catch herself before falling to the ground.  She subsequently walked into her bedroom and then woke up several hours later on before, unsure how she got there.  She presumes that she passed out and was on the ground for at least 3 to 4 hours.  She noted an abrasion on her back but did not seek medical attention.  She has not had any further falls but continues to have occasional dizziness.  She has a long history of palpitations that are stable or may be slightly improved with diltiazem.  She seems to be tolerating the diltiazem [name] right.  She continues to struggle with urinary incontinence, which had prompted discontinuation of beta-blockers last year in favor of diltiazem.  She was scheduled to undergo some sort of an implantation at [facility] to help with this, though her surgery was canceled on account of recurrent fevers.  April [name] reports almost daily fevers of up to 102 to 103 degrees.  She has been evaluated by multiple subspecialists (including infectious disease and will be due to incidentally discovered adnexal cyst) without clear etiology for her fevers.  She wonders if they could be related to her factor V Leiden deficiency.  She previously followed with Dr. Murvin Natal (affiliated with [facility]), though his practice has moved.  She is not currently on any anticoagulation, having completed a 3-year course of rivaroxaban in the past, and does not have routine hematology follow-up.  --------------------------------------------------------------------------------------------------  Cardiovascular History & Procedures: Cardiovascular Problems:  Atypical chest pain  Diastolic dysfunction (resolved on most recent echo)  Palpitations and questionable syncope  Factor V Leiden mutation with history of gonadal vein thrombus extending into IVC  Risk Factors:  Hypercoagulable state  Cath/PCI:  None  CV Surgery:  Lower extremity venous ablation (12/2015, Dr. Lucky Cowboy)  EP Procedures and  Devices:  None  Non-Invasive Evaluation(s):  TTE (10/16/16): Normal LV size and function.  LVEF 55-60% with normal wall motion and diastolic parameters.  Normal RV size and function.  Exercise myocardial perfusion stress test (12/16/15): Normal study. No evidence of ischemia or scar. Patient exercised 6 minutes achieving 7 METs with peak heart rate of 151 bpm (85% MPHR).  CTA chest (12/06/15): No pulmonary embolism. No significant coronary artery calcification. Mosaic attenuation pattern in both lungs that may reflect small airway disease.  Transthoracic echocardiogram (10/06/15, [place]): Normal LV function with moderate diastolic dysfunction. Mild mitral and tricuspid regurgitation.  Recent CV Pertinent Labs: Lab Results  Component Value Date   INR 1.13 12/15/2011   K 4.9 07/13/2016   K 3.3 (L) 07/15/2013   MG 2.2 07/13/2016   BUN 5 (L) 07/13/2016   BUN 7 07/15/2013   CREATININE 0.77 07/13/2016   CREATININE 0.96 07/15/2013    Past medical and surgical history were reviewed and updated in EPIC.  Current Meds  Medication Sig  . albuterol (PROVENTIL HFA;VENTOLIN HFA) 108 (90 BASE) MCG/ACT inhaler Inhale 1 puff into the lungs as needed. For shortness of breath or wheezing  . baclofen (LIORESAL) 20 MG tablet Take 20 mg by mouth 3 (three) times daily.  . butalbital-acetaminophen-caffeine (FIORICET, ESGIC) 50-325-40 MG tablet TK 1 T PO Q 6 H PRN  . Calcium-Phosphorus-Vitamin D (CALCIUM GUMMIES PO) Take 1 tablet by mouth every evening.  [name] CARTIA XT 120 MG 24 hr capsule TAKE 1 CAPSULE(120 MG) BY MOUTH DAILY  . Cholecalciferol (VITAMIN D3) 5000 units TABS Take by mouth daily.  [name] co-enzyme Q-10 30 MG capsule Take 30 mg by mouth daily.  . Cyanocobalamin 5000 MCG SUBL Place 5,000 mcg under the tongue daily.   . diazepam (VALIUM) 10 MG tablet Take 10 mg by mouth daily.  [name] ELMIRON 100 MG capsule Take 200 mg by mouth 2 (two) times daily.  . fentaNYL (DURAGESIC - DOSED  MCG/HR) 50 MCG/HR Place 50 mcg onto the skin every 3 (three) days.  . Ferrous Gluconate (IRON 27 PO) Take by mouth daily.  [name] Fexofenadine HCl (ALLEGRA PO) Take 1 tablet by mouth daily as needed. For allergies/sinues  . fluconazole (DIFLUCAN) 150 MG tablet   . fluticasone (FLONASE) 50 MCG/ACT nasal spray SHAKE LQ AND U 1 TO 2 SPRAYS IEN QD UTD  . Fluticasone-Salmeterol (ADVAIR) 250-50 MCG/DOSE AEPB Inhale 1 puff into the lungs as needed.  . furosemide (LASIX) 20 MG tablet Take 20 mg by mouth daily.  [name] gabapentin (NEURONTIN) 600 MG tablet Take 600 mg by mouth 3 (three) times daily.   [name] guaifenesin (HUMIBID E) 400 MG TABS Take 1,200 mg by mouth 2 (two) times daily as needed. For congestion  . hydrOXYzine (VISTARIL) 25 MG capsule Take 50 mg by mouth at bedtime.   . hyoscyamine (LEVBID) 0.375 MG 12 hr tablet TK 1 T PO Q 12 H  . lidocaine (XYLOCAINE) 5 % ointment Apply topically daily.  . mometasone (ELOCON) 0.1 % cream Apply topically as needed.  . montelukast (SINGULAIR) 10 MG tablet Take 10 mg by mouth at bedtime.  . Multiple Vitamin (MULITIVITAMIN WITH MINERALS) TABS Take 1 tablet by mouth every evening.   [name] oxybutynin (DITROPAN-XL) 5 MG 24 hr tablet Take 5 mg by mouth at bedtime.  . pantoprazole (PROTONIX) 40 MG  tablet Take 40 mg by mouth daily.  . Probiotic Product (ALIGN) 4 MG CAPS Take 1 capsule by mouth daily.  [name] PROCTOZONE-HC 2.5 % rectal cream Apply topically as needed.  . progesterone (PROMETRIUM) 100 MG capsule   . pseudoephedrine (SUDAFED) 30 MG tablet Take 60 mg by mouth 4 (four) times daily.  . Simethicone 125 MG CAPS Take 125 mg by mouth 4 (four) times daily.  . traMADol (ULTRAM) 50 MG tablet Take 50 mg by mouth as needed.  . valACYclovir (VALTREX) 500 MG tablet TK 1 T PO QD  . vitamin E (VITAMIN E) 400 UNIT capsule Take 800 Units by mouth daily.  . Zinc 25 MG TABS Take by mouth daily.   Current Facility-Administered Medications for the 08/01/17 encounter (Office Visit) with Michael Ventresca,  Harrell Gave, MD  Medication  . betamethasone acetate-betamethasone sodium phosphate (CELESTONE) injection 3 mg    Allergies: Amitriptyline; Amoxicillin; Bromelains; Celexa [citalopram hydrobromide]; Ivp dye  [iodinated diagnostic agents]; Nortriptyline; Penicillins; Pregabalin; Bromelains [pineapple extract]; Budesonide-formoterol fumarate; Citalopram; Mangifera indica; Methylene blue; Sertraline; Sulfa antibiotics; Sulfonamide derivatives; Bromaline [albertsons di bromm]; and Other  Social History   Tobacco Use  . Smoking status: Never Smoker  . Smokeless tobacco: Never Used  Substance Use Topics  . Alcohol use: No  . Drug use: No    Family History  Problem Relation Age of Onset  . Heart Problems Mother   . Vascular Disease Mother   . Vascular Disease Maternal Grandmother   . Anesthesia problems Neg Hx     Review of Systems: A 12-system review of systems was performed and was negative except as noted in the HPI.  --------------------------------------------------------------------------------------------------  Physical Exam: BP 118/78 (BP Location: Left Arm, Patient Position: Sitting, Cuff Size: Normal)   Pulse 73   Ht 5\' 4"  (1.626 m)   Wt 197 lb (89.4 kg)   LMP 05/20/2011   BMI 33.81 kg/m   General: Obese woman seated in the exam room.  She has pressured speech and provides tangential answers throughout the interview. HEENT: No conjunctival pallor or scleral icterus. Moist mucous membranes.  OP clear. Neck: Supple without lymphadenopathy, thyromegaly, JVD, or HJR. Lungs: Normal work of breathing. Clear to auscultation bilaterally without wheezes or crackles. Heart: Regular rate and rhythm without murmurs, rubs, or gallops. Non-displaced PMI. Abd: Bowel sounds present. Soft, NT/ND without hepatosplenomegaly Ext: No lower extremity edema. Radial, PT, and DP pulses are 2+ bilaterally. Skin: Warm and dry without rash.  EKG: Normal sinus rhythm without  abnormality.  Lab Results  Component Value Date   WBC 6.8 07/13/2016   HGB 13.0 07/13/2016   HCT 39.0 07/13/2016   MCV 94 07/13/2016   PLT 258 07/13/2016    Lab Results  Component Value Date   NA 142 07/13/2016   K 4.9 07/13/2016   CL 102 07/13/2016   CO2 22 07/13/2016   BUN 5 (L) 07/13/2016   CREATININE 0.77 07/13/2016   GLUCOSE 92 07/13/2016   ALT 35 (H) 07/13/2016    No results found for: CHOL, HDL, LDLCALC, LDLDIRECT, TRIG, CHOLHDL  --------------------------------------------------------------------------------------------------  ASSESSMENT AND PLAN: Palpitations and questionable syncope Palpitations have been long-standing.  I am very unclear about the dizziness and possible syncopal episode that occurred last month.  She has not had any recurrent symptoms.  She is on multiple medications that could have precipitated this, including opiates, benzodiazepines, antihistamines, and neuroleptics.  We have agreed to obtain a 30-day event monitor.  Previous cardiac work-up including stress test and echocardiograms have  been unrevealing.  If event monitor does not show any significant arrhythmias, I recommend that April [name] see a neurologist and/or psychiatrist for further evaluation.  Pending work-up, I have advised her to refrain from driving for at least 6 months from the time of her syncopal episode.  We will continue current dose of diltiazem.  Atypical chest pain Chronic and stable.  Myocardial perfusion stress test in 2017 was normal.  No further work-up at this time.  Diastolic dysfunction Despite chronic shortness of breath, April [name] appears euvolemic on exam today.  Her most recent echocardiogram in 09/2016 also showed normal LV systolic and diastolic parameters without significant valvular heart disease.  We will defer further work-up at this time, as I do not believe that her symptoms are primarily cardiac in nature.  Factor V Leiden and recurrent fevers April [name] is  afebrile today.  It appears that she has been worked up by several specialists at [facility].  I will defer additional workup and referral to a new hematologist (as appropriate) to her team at [facility] and her PCP.  Follow-up: Return to clinic in 6-8 weeks.  Greater than 60 minutes was spent face-to-face with the patient, of which more than 50% was spent on counseling.  April Bush, MD 08/02/2017 11:52 AM

## 2017-08-01 ENCOUNTER — Ambulatory Visit (INDEPENDENT_AMBULATORY_CARE_PROVIDER_SITE_OTHER): Payer: Medicare Other | Admitting: Internal Medicine

## 2017-08-01 ENCOUNTER — Encounter: Payer: Self-pay | Admitting: Internal Medicine

## 2017-08-01 VITALS — BP 118/78 | HR 73 | Ht 64.0 in | Wt 197.0 lb

## 2017-08-01 DIAGNOSIS — R55 Syncope and collapse: Secondary | ICD-10-CM

## 2017-08-01 DIAGNOSIS — I5189 Other ill-defined heart diseases: Secondary | ICD-10-CM | POA: Diagnosis not present

## 2017-08-01 DIAGNOSIS — R002 Palpitations: Secondary | ICD-10-CM | POA: Diagnosis not present

## 2017-08-01 DIAGNOSIS — R0789 Other chest pain: Secondary | ICD-10-CM | POA: Diagnosis not present

## 2017-08-01 NOTE — Patient Instructions (Addendum)
Medication Instructions: Your physician recommends that you continue on your current medications as directed. Please refer to the Current Medication list given to you today.  If you need a refill on your cardiac medications before your next appointment, please call your pharmacy.   Procedures/Testing: Your physician has recommended that you wear a 30 day event monitor. Event monitors are medical devices that record the heart's electrical activity. Doctors most often [place] these monitors to diagnose arrhythmias. Arrhythmias are problems with the speed or rhythm of the heartbeat. The monitor is a small, portable device. You can wear one while you do your normal daily activities. This is usually used to diagnose what is causing palpitations/syncope (passing out).  - You will be mailed a monitor from [employer].  - They will call you in the next day or so to verify your address. Then is will take 5-7 days to be mailed to you. - You will wear for 30 days and then place all the pieces of equipment that came with the device back in the provided box and take it to your nearest UPS drop off locations. - Call [employer] at [phone], if you have any questions concerning the monitor once you have received it. - DO NOT GET THE MONITOR WET.   Follow-Up: Your physician wants you to follow-up in 6-8 weeks with an APP.   Special Instructions: Please do not drive until further notice.  Thank you for choosing [facility]!

## 2017-08-02 ENCOUNTER — Encounter: Payer: Self-pay | Admitting: Internal Medicine

## 2017-08-02 DIAGNOSIS — R55 Syncope and collapse: Secondary | ICD-10-CM | POA: Insufficient documentation

## 2017-08-08 ENCOUNTER — Ambulatory Visit: Payer: Medicare Other | Admitting: Internal Medicine

## 2017-08-10 ENCOUNTER — Telehealth: Payer: Self-pay | Admitting: Internal Medicine

## 2017-08-10 NOTE — Telephone Encounter (Signed)
Spoke with patient and she reports that they are going to put a type of monitoring system through urology. Patient just wanted to make sure this monitor would not interfere with her upcoming surgery. Instructed her to go ahead and start wearing it and to remove it for any surgical procedures. She verbalized understanding with no further questions at this time.

## 2017-08-10 NOTE — Telephone Encounter (Signed)
Please call regarding 30 day monitor, states she is having surgery and has some questions .

## 2017-08-18 ENCOUNTER — Other Ambulatory Visit (INDEPENDENT_AMBULATORY_CARE_PROVIDER_SITE_OTHER): Payer: Medicare Other

## 2017-08-18 DIAGNOSIS — R55 Syncope and collapse: Secondary | ICD-10-CM | POA: Diagnosis not present

## 2017-09-19 ENCOUNTER — Ambulatory Visit: Payer: Medicare Other | Admitting: Nurse Practitioner

## 2017-10-04 ENCOUNTER — Telehealth: Payer: Self-pay | Admitting: Internal Medicine

## 2017-10-04 NOTE — Telephone Encounter (Signed)
Patient had a recent procedure and it is difficult for her to get around Would like to discuss with nurse about how necessary appointment is, may want to reschedule Please call to discuss

## 2017-10-04 NOTE — Telephone Encounter (Signed)
No answer. Left detail message with recommendation that she should keep scheduled appointment tomorrow, ok per DPR, and to call back if any questions.

## 2017-10-05 ENCOUNTER — Ambulatory Visit: Payer: Medicare Other | Admitting: Nurse Practitioner

## 2017-10-12 ENCOUNTER — Other Ambulatory Visit: Payer: Self-pay

## 2017-10-12 ENCOUNTER — Telehealth: Payer: Self-pay | Admitting: Internal Medicine

## 2017-10-12 MED ORDER — DILTIAZEM HCL ER COATED BEADS 120 MG PO CP24: 120.0000 mg | ORAL_CAPSULE | Freq: Every day | ORAL | 5 refills | Status: DC

## 2017-10-12 NOTE — Telephone Encounter (Signed)
°*  STAT* If patient is at the pharmacy, call can be transferred to refill team.   1. Which medications need to be refilled? (please list name of each medication and dose if known) Diltiazem 120 mg once a day   2. Which pharmacy/location (including street and city if local pharmacy) is medication to be sent to?  [facility] [address] in [place]   3. Do they need a 30 day or 90 day supply? 90 day

## 2017-10-19 ENCOUNTER — Telehealth: Payer: Self-pay | Admitting: Internal Medicine

## 2017-10-19 NOTE — Telephone Encounter (Signed)
No answer. Left message to call back on first number.  Called other number and s/w patient. She says recently had bladder stimulator placed.  After surgery, she was staying at her parent's house 2 hours away from home and did not have her BP medication.  10/15/17  Her BP was 211/132, HR 84 10/19/17 BP 200/125, HR 99.  Had some chest tightness for a minute today. Patient has resumed her BP medication (dilitiazem) since 9/16.  Had patient re-take BP while on the phone and it was 159/82, HR 90.  Patient says she was feeling better. Advised her to continue to monitor BP/HR and notify us if running consistently greater than 140/90 and to make sure to take medication regularly. Pt verbalized understanding to call 911 or go to the emergency room, if he develops any new or worsening symptoms.

## 2017-10-19 NOTE — Telephone Encounter (Signed)
Pt c/o BP issue: STAT if pt c/o blurred vision, one-sided weakness or slurred speech  1. What are your last 5 BP readings?  9/16 211/132 HR 84 9/20 3pm 200/125 HR 99  2. Are you having any other symptoms (ex. Dizziness, headache, blurred vision, passed out)? Mild chest pain, slight headache  3. What is your BP issue? Blood pressure has been high lately    Patient recently had surgery at [facility] they sent her the results of some of the tests but she is having a hard time understanding them, would like to know if we can see them and help explain

## 2017-10-19 NOTE — Telephone Encounter (Signed)
Patient does not have cell phone at this time, best number to reach out to is [phone]

## 2017-11-02 ENCOUNTER — Encounter: Payer: Self-pay | Admitting: Nurse Practitioner

## 2017-11-02 ENCOUNTER — Ambulatory Visit (INDEPENDENT_AMBULATORY_CARE_PROVIDER_SITE_OTHER): Payer: Medicare Other | Admitting: Nurse Practitioner

## 2017-11-02 VITALS — BP 130/92 | HR 68 | Ht 65.0 in | Wt 189.2 lb

## 2017-11-02 DIAGNOSIS — R0789 Other chest pain: Secondary | ICD-10-CM | POA: Diagnosis not present

## 2017-11-02 DIAGNOSIS — I10 Essential (primary) hypertension: Secondary | ICD-10-CM | POA: Diagnosis not present

## 2017-11-02 DIAGNOSIS — R002 Palpitations: Secondary | ICD-10-CM

## 2017-11-02 NOTE — Patient Instructions (Addendum)
Medication Instructions: - Your physician recommends that you continue on your current medications as directed. Please refer to the Current Medication list given to you today.  Labwork: - none ordered  Procedures/Testing: - none ordered  Follow-Up: - Your physician wants you to follow-up in: 6 months with Dr. Saunders Revel. You will receive a reminder letter in the mail/ call two months in advance. If you don't receive a letter/ call, please call our office to schedule the follow-up appointment.   Any Additional Special Instructions Will Be Listed Below (If Applicable).  - please consider downloading the [facility] app) to monitor your heart rhythm when you are having symptoms.    If you need a refill on your cardiac medications before your next appointment, please call your pharmacy.

## 2017-11-02 NOTE — Progress Notes (Signed)
Office Visit    Patient Name: April [name] Date of Encounter: 11/02/2017  Primary Care Provider:  Jodi Marble, MD Primary Cardiologist:  [name], MD  Chief Complaint    47 year old female with a prior history of 5 myalgia, atypical chest pain, factor V Leiden mutation, anemia, and diastolic dysfunction, who presents for follow-up related to hypertension and palpitations.  Past Medical History    Past Medical History:  Diagnosis Date  . Anxiety 12/14/2011  . Asthma    no inhaler  . Chest pain    a. 12/2015 CTA chest: no PE. No significant coronary Ca2+. Mosaic attenuation pattern in both lungs, may reflect small airway dzs; c. 12/2015 Ex MV: Walked 6 mins. No ischemia/scar.  . Diastolic dysfunction    a. 10/2015 Echo: EF nl, mod diast dysfxn. Mild MR/TR; b. 09/2016 Echo: EF 55-60%, no rwma. Nl RV size and fxn.  . Factor 5 Leiden mutation, heterozygous ([place])   . Fibromyalgia   . Fracture of 5th metatarsal 03/2011   left foot -    . Heart palpitations    a. Notes intermittent tachycardia (sinus). Did not tolerate beta blockers 2/2 urinary incontinence.  . Interstitial cystitis   . Ovarian cyst   . Pancreatitis   . Pelvic floor dysfunction   . Rheumatoid arteritis ([place])   . Sinus infection    chronic  . Syncope    a. 08/2017 Event monitor: Average heart rate 77 (49-136).  Periods of sinus arrhythmia noted.  Triggered events corresponded with sinus rhythm, sinus arrhythmia, and artifact.  No significant ectopy, sustained arrhythmias, or prolonged pauses observed.  . Thrombosis    a. 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   Past Surgical History:  Procedure Laterality Date  . ABDOMINAL HYSTERECTOMY    . CHOLECYSTECTOMY    . CYSTOSCOPY WITH HYDRODISTENSION AND BIOPSY  04/21/2011   Dr Philis Fendt  . LAPAROSCOPIC ASSISTED VAGINAL HYSTERECTOMY  08/21/2011   Procedure:  LAPAROSCOPIC ASSISTED VAGINAL HYSTERECTOMY;  Surgeon: Cyril Mourning, MD;  Location: [facility];  Service: Gynecology;  Laterality: N/A;  OPEN LAPAROSCOPIC  . LAPAROSCOPIC NISSEN FUNDOPLICATION  1740  . SALPINGOOPHORECTOMY  08/21/2011   Procedure: SALPINGO OOPHERECTOMY;  Surgeon: Cyril Mourning, MD;  Location: [facility];  Service: Gynecology;  Laterality: Bilateral;  . TUBAL LIGATION  2010  . WISDOM TOOTH EXTRACTION      Allergies  Allergies  Allergen Reactions  . Amitriptyline Other (See Comments)    Other reaction(s): Other Change in mental status  . Amoxicillin Rash  . Bromelains     Other reaction(s): Abdominal pain  . Celexa [Citalopram Hydrobromide] Other (See Comments)    Other reaction(s): Other Change in mental status  . Ivp Dye  [Iodinated Diagnostic Agents] Anaphylaxis  . Nortriptyline     Other reaction(s): Other  . Penicillins Swelling and Rash  . Pregabalin     Other reaction(s): Myalgias (muscle pain)  . Bromelains [Pineapple Extract] Other (See Comments)    Severe pain  . Budesonide-Formoterol Fumarate     Other reaction(s): Other  . Citalopram Other (See Comments)  . Mangifera Indica Swelling  . Methylene Blue Other (See Comments)    Caused inflamed pancreas, per pt should not use  . Sertraline Other (See Comments)    Other reaction(s): Other Suicidal thoughts  . Sulfa Antibiotics Other (See Comments)  . Sulfonamide Derivatives Other (See  Comments)    excrutiating pain, patient states it caused her to have osteoarthritis  . Bromaline [Albertsons Di Bromm] Rash  . 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  She was previously on Xarelto however, this was discontinued secondary to  severe anemia.  Other history includes obesity, asthma, rheumatoid arthritis, urinary incontinence, and multiple drug allergies.  Echocardiogram in September 2016 in the setting of dyspnea showed normal LV function with moderate diastolic dysfunction.  November 2017, she underwent stress testing in the setting of atypical chest pain, which did not show any evidence of ischemia or scar.  Other history includes palpitations for which she was previously on beta-blocker (metoprolol followed by carvedilol).  Beta-blocker therapy worsened urinary incontinence and she has subsequently been managed with diltiazem.  A follow-up echo in September 2018 performed at the patient's request, showed stable/normal LV function without mention of diastolic dysfunction.  She was last seen in clinic in July at which time she reported 3 episodes of near syncope and syncope that occurred in June.  Episodes were prolonged, up to 3 to 4 hours.  She also reported almost daily fevers up to 102 to 103 degrees.  In the setting of presyncope and syncope, 30-day event monitor was placed and this did not show any significant abnormalities.  On September 13, she underwent implantation of a bladder stimulator.  Post procedure, she developed subxiphoid chest pain as documented by the anesthesiologist at [facility].  An EKG was performed and per notes, this showed no acute changes.  She was treated with Versed and oxycodone with resolution.  She apparently underwent an echocardiogram while still in the recovery area which reportedly did not show any significant findings.  I read looked through care everywhere and though I can see she had an echo on September 13, no report is available.  Today, she says that for the period that she wore her event monitor, she was mostly laid up in bed in the setting of a urinary tract infection which required multiple rounds of antibiotics.  She says she did not really have any significant palpitations,  presyncope, or syncope during that time.  Since then, she has continued to have intermittent fevers which are often associated with elevated heart rates and she thinks are even preceded by elevated heart rates.  Fever seemed to break within a couple of hours just as heart rates as low.  She also had 2 episodes of tachypalpitations, one occurring while she was out at [employer] with her daughters, lasting several minutes and resolving spontaneously.  The second episode occurred several days later when she was at home and laughing at a joke and had tachypalpitations that lasted 30 or 40 minutes.  She says her blood pressure was elevated when she checked it on that particular day.  It was as high as 671 systolic.  She says she had run out of her diltiazem earlier that week but had been back on it for a few days prior to these events.  She has not had any persistent tachypalpitations since that last event a few weeks ago but has continued to have intermittent elevations in heart rate.  She also continues to experience intermittent sharp chest  pain.  She denies dyspnea, PND, orthopnea, dizziness, syncope, edema, or early satiety.  Home Medications    Prior to Admission medications   Medication Sig Start Date End Date Taking? Authorizing Provider  albuterol (PROVENTIL HFA;VENTOLIN HFA) 108 (90 BASE) MCG/ACT inhaler Inhale 1 puff into the lungs as needed. For shortness of breath or wheezing    [provider]  baclofen (LIORESAL) 20 MG tablet Take 20 mg by mouth 3 (three) times daily.    [provider]  butalbital-acetaminophen-caffeine (FIORICET, ESGIC) 50-325-40 MG tablet TK 1 T PO Q 6 H PRN 05/31/16   [provider]  Calcium-Phosphorus-Vitamin D (CALCIUM GUMMIES PO) Take 1 tablet by mouth every evening.    [provider]  Cholecalciferol (VITAMIN D3) 5000 units TABS Take by mouth daily.    [provider]  co-enzyme Q-10 30 MG capsule Take 30 mg by mouth  daily.    [provider]  Cyanocobalamin 5000 MCG SUBL Place 5,000 mcg under the tongue daily.     [provider]  diazepam (VALIUM) 10 MG tablet Take 10 mg by mouth daily.    [provider]  diltiazem (CARTIA XT) 120 MG 24 hr capsule Take 1 capsule (120 mg total) by mouth daily. 10/12/17   End, Harrell Gave, MD  ELMIRON 100 MG capsule Take 200 mg by mouth 2 (two) times daily. 09/26/16   [provider]  fentaNYL (DURAGESIC - DOSED MCG/HR) 50 MCG/HR Place 50 mcg onto the skin every 3 (three) days.    [provider]  Ferrous Gluconate (IRON 27 PO) Take by mouth daily.    [provider]  Fexofenadine HCl (ALLEGRA PO) Take 1 tablet by mouth daily as needed. For allergies/sinues    [provider]  fluconazole (DIFLUCAN) 150 MG tablet  08/14/16   [provider]  fluticasone (FLONASE) 50 MCG/ACT nasal spray SHAKE LQ AND U 1 TO 2 SPRAYS IEN QD UTD 06/03/16   [provider]  Fluticasone-Salmeterol (ADVAIR) 250-50 MCG/DOSE AEPB Inhale 1 puff into the lungs as needed.    [provider]  furosemide (LASIX) 20 MG tablet Take 20 mg by mouth daily.    [provider]  gabapentin (NEURONTIN) 600 MG tablet Take 600 mg by mouth 3 (three) times daily.  09/26/16   [provider]  guaifenesin (HUMIBID E) 400 MG TABS Take 1,200 mg by mouth 2 (two) times daily as needed. For congestion    [provider]  hydrOXYzine (VISTARIL) 25 MG capsule Take 50 mg by mouth at bedtime.     [provider]  hyoscyamine (LEVBID) 0.375 MG 12 hr tablet TK 1 T PO Q 12 H 05/13/16   [provider]  lidocaine (XYLOCAINE) 5 % ointment Apply topically daily. 09/26/16   [provider]  mometasone (ELOCON) 0.1 % cream Apply topically as needed. 09/27/16   [provider]  montelukast (SINGULAIR) 10 MG tablet Take 10 mg by mouth at bedtime.    [provider]  Multiple Vitamin  (MULITIVITAMIN WITH MINERALS) TABS Take 1 tablet by mouth every evening.     [provider]  oxybutynin (DITROPAN-XL) 5 MG 24 hr tablet Take 5 mg by mouth at bedtime.    [provider]  pantoprazole (PROTONIX) 40 MG tablet Take 40 mg by mouth daily.    [provider]  Probiotic Product (ALIGN) 4 MG CAPS Take 1 capsule by mouth daily.    [provider]  PROCTOZONE-HC 2.5 %  rectal cream Apply topically as needed. 09/26/16   [provider]  progesterone (PROMETRIUM) 100 MG capsule  08/03/16   [provider]  pseudoephedrine (SUDAFED) 30 MG tablet Take 60 mg by mouth 4 (four) times daily.    [provider]  Simethicone 125 MG CAPS Take 125 mg by mouth 4 (four) times daily.    [provider]  traMADol (ULTRAM) 50 MG tablet Take 50 mg by mouth as needed. 09/26/16   [provider]  valACYclovir (VALTREX) 500 MG tablet TK 1 T PO QD 06/07/16   [provider]  vitamin E (VITAMIN E) 400 UNIT capsule Take 800 Units by mouth daily.    [provider]  Zinc 25 MG TABS Take by mouth daily.    [provider]    Review of Systems    She has intermittent sharp chest pain, intermittent tachypalpitations, intermittent abdominal and epigastric discomfort, chronic diarrhea, recurrent urinary tract infections with associated symptoms, occasional fatigue, intermittent fevers up to 103 F.  She denies dyspnea, PND, orthopnea, dizziness, syncope, edema, or early satiety.  All other systems reviewed and are otherwise negative except as noted above.  Physical Exam    VS:  BP (!) 130/92 (BP Location: Left Arm, Patient Position: Sitting, Cuff Size: Normal)   Pulse 68   Ht 5\' 5"  (1.651 m)   Wt 189 lb 4 oz (85.8 kg)   LMP 05/20/2011   BMI 31.49 kg/m  , BMI Body mass index is 31.49 kg/m. GEN: Well nourished, well developed, in no acute distress. HEENT: normal. Neck: Supple, no JVD, carotid bruits, or  masses. Cardiac: RRR, mildly tachycardic, no murmurs, rubs, or gallops. No clubbing, cyanosis, edema.  Radials/DP/PT 2+ and equal bilaterally.  Respiratory:  Respirations regular and unlabored, clear to auscultation bilaterally. GI: Soft, nontender, nondistended, BS + x 4. MS: no deformity or atrophy. Skin: warm and dry, no rash. Neuro:  Strength and sensation are intact. Psych: Normal affect.  Accessory Clinical Findings    ECG personally reviewed by me today -regular sinus rhythm, 68, no acute ST or T changes  Assessment & Plan    1.  Tachypalpitations: Patient with a long history of intermittent palpitations.  An event monitor was placed over the month of August and did not show any significant arrhythmias, ectopy, or pauses.  Triggered events were associated with sinus rhythm, sinus arrhythmia, and artifact.  She says that during the time that she wore that monitor, she was taking it easy because she had been dealing with a urinary tract infection and did not feel well.  She also says she was very stressed by trying to keep the event monitor in place and charging the monitor on a regular basis.  That said, she did wear it for over 80% of the time.  She has continued to have intermittent elevations in heart rate, often associated with fevers, and has had 2 episodes of more persistent tachypalpitations in the past month.  We discussed possible routes of evaluation including repeat event monitoring versus Zio monitor versus her purchasing an [employer] monitor for her own personal use.  She is going to look into the latter option she prefers to avoid any additional monitoring at this time.  She remains on diltiazem therapy.  2.  Syncope: She reported this at her last visit in July, prompting placement of event monitor.  No events noted on that monitor.  No recurrent syncope.  3.  Atypical chest pain: She has a  long history of atypical and sharp chest pain.  She had a negative stress test in 2017.   She recently had chest pain following placement of a bladder stimulator at [facility].  She was still in the OR when this occurred.  Anesthesia note reviewed and patient reported sharp subxiphoid chest pain that resolved with Versed and oxycodone.  ECG was nonacute at this time per report.  Patient says she also had an echocardiogram which was reportedly normal.  That result is not available for review.  Since then, she has had a few episodes of fleeting sharp chest pain.  ECG is normal today.  No further ischemic evaluation warranted at this time.  3.  Diastolic dysfunction: This was noted on prior echocardiogram in 2017.  Echo in September 2018 showed normal LV systolic and diastolic parameters.  Heart rate and blood pressure stable today.  She is euvolemic on exam.  We discussed the importance of limiting salt in her diet, she does eat out a fair amount.  4.  Factor V Leiden: Apparently in the process of obtaining a new hematologist at [facility].  5.  Recurrent fevers: This is apparently been evaluated previously but continues to occur.  I will defer to her care team at [facility].  6.  Essential HTN:  She reports that she recently had markedly elevated pressures in the setting of elevated HR's.  BP stable today. Cont dilt and lasix.  7.  Disposition: Patient's multiple issues discussed at length today for 45 mins.  Patient will follow-up in 6 months or sooner if necessary.  Murray Hodgkins, NP 11/02/2017, 4:28 PM

## 2017-11-29 ENCOUNTER — Telehealth: Payer: Self-pay | Admitting: Internal Medicine

## 2017-11-29 NOTE — Telephone Encounter (Addendum)
Patient calling complaining of having an episode of elevated BP and HR last night and this morning at her urology office. Last night BP 194/135  HR 199 at 8:24 pm and BP 159/80 HR 100 at 10:00 pm. Patient stated today her BP at the urology office was 151/110, 126/109, 115/73 and HR 117 and HR 97. Patient stated her urologist told her to call her cardiologist office. Made patient an appointment with NP in [facility] and Encouraged patient to go to ED next time she has an episode. Will forward to Dr. Saunders Revel for further advisement.   Patient has multiple issues other than cardiac problems. Patient rambled on about so many issues that she was hard to follow.  Spent over twenty minutes on the phone with patient.

## 2017-11-29 NOTE — Telephone Encounter (Signed)
Pt states she had an episode last night of chest pain and rapid heart rate.  Pt c/o of Chest Pain: STAT if CP now or developed within 24 hours  1. Are you having CP right now? Not pain, just sore feeling  2. Are you experiencing any other symptoms (ex. SOB, nausea, vomiting, sweating)? Sob, HR was 199.  3. How long have you been experiencing CP? Just last night   4. Is your CP continuous or coming and going? continuous  5. Have you taken Nitroglycerin? No  ?

## 2017-11-30 NOTE — Telephone Encounter (Signed)
I agree with f/u with APP.  Extensive cardiac workup for multiple symptoms has been unrevealing thus far.  [name], MD [facility] Pager: [phone]

## 2017-12-07 ENCOUNTER — Ambulatory Visit: Payer: Medicare Other | Admitting: Nurse Practitioner

## 2018-04-09 ENCOUNTER — Other Ambulatory Visit: Payer: Self-pay | Admitting: Internal Medicine

## 2018-05-08 ENCOUNTER — Telehealth: Payer: Self-pay

## 2018-05-08 NOTE — Telephone Encounter (Signed)
Virtual Visit Pre-Appointment Phone Call  Steps For Call:  1. Confirm consent - "In the setting of the current Covid19 crisis, you are scheduled for a video visit with your provider on May 22, 2018 at 2:20PM.  Just as we do with many in-office visits, in order for you to participate in this visit, we must obtain consent.  If you'd like, I can send this to your mychart (if signed up) or email for you to review.  Otherwise, I can obtain your verbal consent now.  All virtual visits are billed to your insurance company just like a normal visit would be.  By agreeing to a virtual visit, we'd like you to understand that the technology does not allow for your provider to perform an examination, and thus may limit your provider's ability to fully assess your condition.  Finally, though the technology is pretty good, we cannot assure that it will always work on either your or our end, and in the setting of a video visit, we may have to convert it to a phone-only visit.  In either situation, we cannot ensure that we have a secure connection.  Are you willing to proceed?"  2. Give patient instructions for WebEx download to smartphone as below if video visit  3. Advise patient to be prepared with any vital sign or heart rhythm information, their current medicines, and a piece of paper and pen handy for any instructions they may receive the day of their visit  4. Inform patient they will receive a phone call 15 minutes prior to their appointment time (may be from unknown caller ID) so they should be prepared to answer  5. Confirm that appointment type is correct in Epic appointment notes (video vs telephone)    TELEPHONE CALL NOTE  April [name] has been deemed a candidate for a follow-up tele-health visit to limit community exposure during the Covid-19 pandemic. I spoke with the patient via phone to ensure availability of phone/video source, confirm preferred email & phone number, and discuss  instructions and expectations.  I reminded April [name] to be prepared with any vital sign and/or heart rhythm information that could potentially be obtained via home monitoring, at the time of her visit. I reminded April [name] to expect a phone call at the time of her visit if her visit.  Did the patient verbally acknowledge consent to treatment? YES   L Newcomer McClain 05/08/2018 1:26 PM   DOWNLOADING THE [place]  - If Apple, go to [employer] and type in WebEx in the search bar. [place] [employer], the blue/green circle. The app is free but as with any other app downloads, their phone may require them to verify saved payment information or Apple password. The patient does NOT have to create an account.  - If Android, ask patient to go to [employer] and type in WebEx in the search bar. [place] [employer], the blue/green circle. The app is free but as with any other app downloads, their phone may require them to verify saved payment information or Android password. The patient does NOT have to create an account.   CONSENT FOR TELE-HEALTH VISIT - PLEASE REVIEW  I hereby voluntarily request, consent and authorize [facility] and its employed or contracted physicians, physician assistants, nurse practitioners or other licensed health care professionals (the Practitioner), to provide me with telemedicine health care services (the "Services") as deemed necessary by the treating Practitioner.  I acknowledge and consent to receive the Services by the Practitioner via telemedicine. I understand that the telemedicine visit will involve communicating with the Practitioner through live audiovisual communication technology and the disclosure of certain medical information by electronic transmission. I acknowledge that I have been given the opportunity to request an in-person assessment or other available alternative prior to the telemedicine visit  and am voluntarily participating in the telemedicine visit.  I understand that I have the right to withhold or withdraw my consent to the use of telemedicine in the course of my care at any time, without affecting my right to future care or treatment, and that the Practitioner or I may terminate the telemedicine visit at any time. I understand that I have the right to inspect all information obtained and/or recorded in the course of the telemedicine visit and may receive copies of available information for a reasonable fee.  I understand that some of the potential risks of receiving the Services via telemedicine include:  [name] Delay or interruption in medical evaluation due to technological equipment failure or disruption; . Information transmitted may not be sufficient (e.g. poor resolution of images) to allow for appropriate medical decision making by the Practitioner; and/or  . In rare instances, security protocols could fail, causing a breach of personal health information.  Furthermore, I acknowledge that it is my responsibility to provide information about my medical history, conditions and care that is complete and accurate to the best of my ability. I acknowledge that Practitioner's advice, recommendations, and/or decision may be based on factors not within their control, such as incomplete or inaccurate data provided by me or distortions of diagnostic images or specimens that may result from electronic transmissions. I understand that the practice of medicine is not an exact science and that Practitioner makes no warranties or guarantees regarding treatment outcomes. I acknowledge that I will receive a copy of this consent concurrently upon execution via email to the email address I last provided but may also request a printed copy by calling the office of [place].    I understand that my insurance will be billed for this visit.   I have read or had this consent read to me. . I understand  the contents of this consent, which adequately explains the benefits and risks of the Services being provided via telemedicine.  . I have been provided ample opportunity to ask questions regarding this consent and the Services and have had my questions answered to my satisfaction. . I give my informed consent for the services to be provided through the use of telemedicine in my medical care  By participating in this telemedicine visit I agree to the above.

## 2018-05-21 NOTE — Progress Notes (Signed)
Virtual Visit via Video Note   This visit type was conducted due to national recommendations for restrictions regarding the COVID-19 Pandemic (e.g. social distancing) in an effort to limit this patient's exposure and mitigate transmission in our community.  Due to her co-morbid illnesses, this patient is at least at moderate risk for complications without adequate follow up.  This format is felt to be most appropriate for this patient at this time.  All issues noted in this document were discussed and addressed.  A limited physical exam was performed with this format.  Please refer to the patient's chart for her consent to telehealth for [facility].   Evaluation Performed:  Follow-up visit  Date:  05/22/2018   ID:  April [name], DOB 20-Dec-1970, MRN [id number]  Patient Location: Home Provider Location: Office  PCP:  Jodi Marble, MD  Cardiologist:  [name], MD  Electrophysiologist:  None   Chief Complaint:  Palpitations  History of Present Illness:    April [name] is a 48 y.o. 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 Bynum Bellows @ [place]) whowas on Xarelto for this(discontinued due to severe anemia), fibromyalgia, asthma andrheumatoid arthritis.  She was last seen in our office in 10/2017 by Ignacia Bayley, NP, for follow-up of several syncopal/near syncopal episodes in the summer.  While undergoing implantation of a bladder stimulator in September at [facility], the patient complained of chest pain in the PACU.  EKG at that time was read as sinus rhythm with LVH and inferior infarct, age indeterminate.  At her follow-up visit with [place] in October, she continued to complain of intermittent fevers and palpitations.  No further testing or medication changes were recommended.  Today, April [name] reports that she has been doing better from a heart standpoint.  She now feels like her  palpitations and chest pain are connected to her bladder spasms, which have been helped tremendously by the bladder stimulator.  She notes that the bladder stimulator is "about to get pushed out" and will require a pocket revision once COVID-19 precautions have been eased.  She is walking daily on her parents' farm without any limitations.  She denies shortness of breath, lightheadedness/syncope, and edema.  She is tolerating her current medications well.  April [name] is currently dealing with a shingle flare, which has also brought on diarrhea and low-grade fevers.  The patient does not have symptoms concerning for COVID-19 infection (fever, chills, cough, or new shortness of breath).    Past Medical History:  Diagnosis Date  . Anxiety 12/14/2011  . Asthma    no inhaler  . Chest pain    a. 12/2015 CTA chest: no PE. No significant coronary Ca2+. Mosaic attenuation pattern in both lungs, may reflect small airway dzs; c. 12/2015 Ex MV: Walked 6 mins. No ischemia/scar.  . Diastolic dysfunction    a. 10/2015 Echo: EF nl, mod diast dysfxn. Mild MR/TR; b. 09/2016 Echo: EF 55-60%, no rwma. Nl RV size and fxn.  . Factor 5 Leiden mutation, heterozygous ([place])   . Fibromyalgia   . Fracture of 5th metatarsal 03/2011   left foot -    . Heart palpitations    a. Notes intermittent tachycardia (sinus). Did not tolerate beta blockers 2/2 urinary incontinence.  . Interstitial cystitis   . Ovarian cyst   . Pancreatitis   . Pelvic floor dysfunction   . Rheumatoid arteritis ([place])   .  Sinus infection    chronic  . Syncope    a. 08/2017 Event monitor: Average heart rate 77 (49-136).  Periods of sinus arrhythmia noted.  Triggered events corresponded with sinus rhythm, sinus arrhythmia, and artifact.  No significant ectopy, sustained arrhythmias, or prolonged pauses observed.  . Thrombosis    a. 12/14/2011 Thrombosis of R gonadal vein with extension of thrombus into IVC to level of R renal vein per CT 12/11/12 of  [facility].   Past Surgical History:  Procedure Laterality Date  . ABDOMINAL HYSTERECTOMY    . CHOLECYSTECTOMY    . CYSTOSCOPY WITH HYDRODISTENSION AND BIOPSY  04/21/2011   Dr Philis Fendt  . LAPAROSCOPIC ASSISTED VAGINAL HYSTERECTOMY  08/21/2011   Procedure: LAPAROSCOPIC ASSISTED VAGINAL HYSTERECTOMY;  Surgeon: Cyril Mourning, MD;  Location: [facility];  Service: Gynecology;  Laterality: N/A;  OPEN LAPAROSCOPIC  . LAPAROSCOPIC NISSEN FUNDOPLICATION  0932  . SALPINGOOPHORECTOMY  08/21/2011   Procedure: SALPINGO OOPHERECTOMY;  Surgeon: Cyril Mourning, MD;  Location: [facility];  Service: Gynecology;  Laterality: Bilateral;  . TUBAL LIGATION  2010  . WISDOM TOOTH EXTRACTION       Current Meds  Medication Sig  . albuterol (PROVENTIL HFA;VENTOLIN HFA) 108 (90 BASE) MCG/ACT inhaler Inhale 1 puff into the lungs as needed. For shortness of breath or wheezing  . AZELASTINE HCL NA Place into the nose daily.  . baclofen (LIORESAL) 20 MG tablet Take 20 mg by mouth 3 (three) times daily.  . Biotin 10000 MCG TABS Take by mouth daily.  . butalbital-acetaminophen-caffeine (FIORICET, ESGIC) 50-325-40 MG tablet TK 1 T PO Q 6 H PRN  . Calcium-Phosphorus-Vitamin D (CALCIUM GUMMIES PO) Take 1 tablet by mouth every evening.  . Cholecalciferol (VITAMIN D3) 5000 units TABS Take by mouth daily.  [name] co-enzyme Q-10 30 MG capsule Take 30 mg by mouth daily.  . Cyanocobalamin 5000 MCG SUBL Place 5,000 mcg under the tongue daily.   . diazepam (VALIUM) 10 MG tablet Take 10 mg by mouth daily.  [name] diltiazem (CARDIZEM CD) 120 MG 24 hr capsule TAKE 1 CAPSULE(120 MG) BY MOUTH DAILY  . ELMIRON 100 MG capsule Take 200 mg by mouth 2 (two) times daily.  [name] Fexofenadine HCl (ALLEGRA PO) Take 1 tablet by mouth daily as needed. For allergies/sinues  . fluconazole (DIFLUCAN) 150 MG tablet Take 150 mg by mouth daily as needed.   . fluticasone (FLONASE) 50 MCG/ACT nasal spray SHAKE LQ AND U 1 TO 2 SPRAYS IEN QD UTD  .  Fluticasone-Salmeterol (ADVAIR) 250-50 MCG/DOSE AEPB Inhale 1 puff into the lungs 2 (two) times a day.   . furosemide (LASIX) 20 MG tablet Take 20 mg by mouth daily.  [name] gabapentin (NEURONTIN) 600 MG tablet Take 600 mg by mouth 3 (three) times daily.   [name] guaifenesin (HUMIBID E) 400 MG TABS Take 1,200 mg by mouth 2 (two) times daily as needed. For congestion  . guaiFENesin (MUCINEX) 600 MG 12 hr tablet Take 1,200 mg by mouth 2 (two) times daily.  . hydrOXYzine (ATARAX/VISTARIL) 50 MG tablet Take 50 mg by mouth at bedtime.  . hyoscyamine (LEVSIN SL) 0.125 MG SL tablet Place 0.125 mg under the tongue every 4 (four) hours as needed for cramping.  . Lactobacillus (ACIDOPHILUS PROBIOTIC PO) Take by mouth 3 (three) times daily.  [name] lidocaine (XYLOCAINE) 5 % ointment Apply topically daily.  [name] Lysine 500 MG CAPS Take by mouth daily.  . montelukast (SINGULAIR) 10 MG tablet Take 10 mg by mouth at  bedtime.  . Multiple Vitamin (MULITIVITAMIN WITH MINERALS) TABS Take 1 tablet by mouth every evening.   . nitrofurantoin (MACRODANTIN) 50 MG capsule Take 50 mg by mouth daily as needed (UTI).  [name] oxybutynin (DITROPAN-XL) 5 MG 24 hr tablet Take 5 mg by mouth as needed.   [name] PROCTOZONE-HC 2.5 % rectal cream Apply topically as needed.  . progesterone (PROMETRIUM) 100 MG capsule Take 100 mg by mouth daily.   . Riboflavin (B2) 100 MG TABS Take by mouth daily.  . Simethicone 125 MG CAPS Take 125 mg by mouth 4 (four) times daily.  . TURMERIC PO Take by mouth daily.  . valACYclovir (VALTREX) 500 MG tablet TK 1 T PO QD  . VITAMIN A PO Take 3,000 mg by mouth daily.  . vitamin E (VITAMIN E) 400 UNIT capsule Take 800 Units by mouth daily.  . Zinc 25 MG TABS Take by mouth daily.   Current Facility-Administered Medications for the 05/22/18 encounter (Telemedicine) with Halcyon Heck, Harrell Gave, MD  Medication  . betamethasone acetate-betamethasone sodium phosphate (CELESTONE) injection 3 mg     Allergies:   Amitriptyline;  Amoxicillin; Bromelains; Celexa [citalopram hydrobromide]; Ivp dye  [iodinated diagnostic agents]; Nortriptyline; Penicillins; Pregabalin; Bromelains [pineapple extract]; Budesonide-formoterol fumarate; Citalopram; Mangifera indica; Methylene blue; Sertraline; Sulfa antibiotics; Sulfonamide derivatives; Bromaline [albertsons di bromm]; and Other   Social History   Tobacco Use  . Smoking status: Never Smoker  . Smokeless tobacco: Never Used  Substance Use Topics  . Alcohol use: No  . Drug use: No     Family Hx: The patient's family history includes Heart Problems in her mother; Vascular Disease in her maternal grandmother and mother. There is no history of Anesthesia problems.  ROS:   Please see the history of present illness.   All other systems reviewed and are negative.   Prior CV studies:   The following studies were reviewed today:   30-day event monitor (09/20/2017): Predominantly sinus rhythm without significant arrhythmia.  TTE (10/16/16): Normal LV size and function.  LVEF 55-60% with normal wall motion and diastolic parameters.  Normal RV size and function.  Exercise myocardial perfusion stress test (12/16/15): Normal study. No evidence of ischemia or scar. Patient exercised 6 minutes achieving 7 METs with peak heart rate of 151 bpm (85% MPHR).  CTA chest (12/06/15): No pulmonary embolism. No significant coronary artery calcification. Mosaic attenuation pattern in both lungs that may reflect small airway disease.  Transthoracic echocardiogram (10/06/15, [facility]): Normal LV function with moderate diastolic dysfunction. Mild mitral and tricuspid regurgitation.  Labs/Other Tests and Data Reviewed:    EKG:  An ECG dated 11/02/2017 was personally reviewed today and demonstrated:  Normal sinus rhythm without abnormality.  Recent Labs: No results found for requested labs within last 8760 hours.   Recent Lipid Panel No results found for: CHOL, TRIG, HDL,  CHOLHDL, LDLCALC, LDLDIRECT  Wt Readings from Last 3 Encounters:  05/22/18 181 lb (82.1 kg)  11/02/17 189 lb 4 oz (85.8 kg)  08/01/17 197 lb (89.4 kg)     Objective:    Vital Signs:  Ht 5\' 5"  (1.651 m)   Wt 181 lb (82.1 kg)   LMP 05/20/2011   BMI 30.12 kg/m    VITAL SIGNS:  reviewed GEN:  no acute distress  ASSESSMENT & PLAN:    Palpitations and atypical chest pain: Much improved with placement/adjustment of bladder stimulator, consistent with non-cardiac etiology for her symptoms.  Prior workup was negative.  No further testing or intervention is  recommended at this time.  It is reasonable to continue current dose of diltiazem.  COVID-19 Education: The signs and symptoms of COVID-19 were discussed with the patient and how to seek care for testing (follow up with PCP or arrange E-visit).  The importance of social distancing was discussed today.  Time:   Today, I have spent 12 minutes with the patient with telehealth technology discussing the above problems.     Medication Adjustments/Labs and Tests Ordered: Current medicines are reviewed at length with the patient today.  Concerns regarding medicines are outlined above.   Tests Ordered: None.  Medication Changes: None.  Disposition:  Follow up in 1 year(s)  Signed, [name], MD  05/22/2018 8:56 PM    [facility]

## 2018-05-22 ENCOUNTER — Encounter: Payer: Self-pay | Admitting: Internal Medicine

## 2018-05-22 ENCOUNTER — Other Ambulatory Visit: Payer: Self-pay

## 2018-05-22 ENCOUNTER — Telehealth (INDEPENDENT_AMBULATORY_CARE_PROVIDER_SITE_OTHER): Payer: Medicare Other | Admitting: Internal Medicine

## 2018-05-22 VITALS — Ht 65.0 in | Wt 181.0 lb

## 2018-05-22 DIAGNOSIS — R002 Palpitations: Secondary | ICD-10-CM

## 2018-05-22 DIAGNOSIS — R0789 Other chest pain: Secondary | ICD-10-CM

## 2018-05-22 NOTE — Patient Instructions (Signed)
Medication Instructions:  Your physician recommends that you continue on your current medications as directed. Please refer to the Current Medication list given to you today.  If you need a refill on your cardiac medications before your next appointment, please call your pharmacy.   Lab work: none If you have labs (blood work) drawn today and your tests are completely normal, you will receive your results only by: . MyChart Message (if you have MyChart) OR . A paper copy in the mail If you have any lab test that is abnormal or we need to change your treatment, we will call you to review the results.  Testing/Procedures: none  Follow-Up: At [facility], you and your health needs are our priority.  As part of our continuing mission to provide you with exceptional heart care, we have created designated Provider Care Teams.  These Care Teams include your primary Cardiologist (physician) and Advanced Practice Providers (APPs -  Physician Assistants and Nurse Practitioners) who all work together to provide you with the care you need, when you need it. You will need a follow up appointment in 12 months.  Please call our office 2 months in advance to schedule this appointment.  You may see Christopher End, MD or one of the following Advanced Practice Providers on your designated Care Team:   Christopher Berge, NP Ryan Dunn, PA-C . Jacquelyn Visser, PA-C    

## 2018-05-29 ENCOUNTER — Other Ambulatory Visit: Payer: Self-pay

## 2018-05-29 MED ORDER — DILTIAZEM HCL ER COATED BEADS 120 MG PO CP24: ORAL_CAPSULE | ORAL | 0 refills | Status: DC

## 2018-09-06 ENCOUNTER — Other Ambulatory Visit: Payer: Self-pay | Admitting: *Deleted

## 2018-09-06 MED ORDER — DILTIAZEM HCL ER COATED BEADS 120 MG PO CP24: ORAL_CAPSULE | ORAL | 2 refills | Status: DC

## 2018-10-28 ENCOUNTER — Encounter (INDEPENDENT_AMBULATORY_CARE_PROVIDER_SITE_OTHER): Payer: Medicare Other | Admitting: Ophthalmology

## 2018-11-04 ENCOUNTER — Encounter (INDEPENDENT_AMBULATORY_CARE_PROVIDER_SITE_OTHER): Payer: Medicare Other | Admitting: Ophthalmology

## 2018-11-15 NOTE — Progress Notes (Signed)
[facility] Note  11/19/2018     CHIEF COMPLAINT Patient presents for Retina Evaluation   HISTORY OF PRESENT ILLNESS: April [name] is a 48 y.o. female who presents to the clinic today for:   HPI    Retina Evaluation    In both eyes.  Associated Symptoms Fever, Scalp Tenderness and Distortion.  Negative for Flashes, Pain, Trauma, Weight Loss, Redness, Floaters, Photophobia, Jaw Claudication, Fatigue, Blind Spot, Glare and Shoulder/Hip pain.  Context:  distance vision, near vision, watching TV and reading.  Treatments tried include artificial tears.  I, the attending physician,  performed the HPI with the patient and updated documentation appropriately.          Comments    Retina Eval per Patient urologist. Patient has been taking the medication Elmiron times 8 years. Patient takes medication for the bladder wall. Patient last eye exam was with Dr.Bulakowski in January. Patient states she can see tv with her contacts but has trouble seeing with her glasses. If she wears contacts she has to use readers. Patient has history of migraines.        Last edited by Bernarda Caffey, MD on 11/19/2018  3:47 PM. 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 Erik Obey, pt states she had  shingles several years ago and now is on  Valtrex daily  Referring physician: Jodi Marble, MD [place],  [place] [id number]  HISTORICAL INFORMATION:   Selected notes from the [facility] - Referred by Dr. Brandt Loosen Tejan-Sie Pt has been on urology med, [place], for over 5 yrs.  Eval for potential retinal damage.   CURRENT MEDICATIONS: No current outpatient medications on file. (Ophthalmic Drugs)   No current facility-administered medications for this visit.  (Ophthalmic Drugs)   Current Outpatient Medications (Other)  Medication Sig  . albuterol (PROVENTIL HFA;VENTOLIN HFA) 108 (90 BASE) MCG/ACT inhaler Inhale 1 puff into the lungs as needed. For shortness of breath or wheezing  . AZELASTINE HCL NA Place into the nose daily.  . baclofen (LIORESAL) 20 MG tablet Take 20 mg by mouth 3 (three) times daily.  . Cholecalciferol (VITAMIN D3) 5000 units TABS Take by mouth daily.  [name] co-enzyme Q-10 30 MG capsule Take 30 mg by mouth daily.  . Cyanocobalamin 5000 MCG SUBL Place 5,000 mcg under the tongue daily.   . diazepam (VALIUM) 10 MG tablet Take 10 mg by mouth daily.  [name] ELMIRON 100 MG capsule Take 200 mg by mouth 2 (two) times daily.  [name] Fexofenadine HCl (ALLEGRA PO) Take 1 tablet by mouth daily as needed. For allergies/sinues  . fluconazole (DIFLUCAN) 150 MG tablet Take 150 mg by mouth daily as needed.   . fluticasone (FLONASE) 50 MCG/ACT  nasal spray SHAKE LQ AND U 1 TO 2 SPRAYS IEN QD UTD  . Fluticasone-Salmeterol (ADVAIR) 250-50 MCG/DOSE AEPB Inhale 1 puff into the lungs 2 (two) times a day.   . furosemide (LASIX) 20 MG tablet Take 20 mg by mouth daily.  [name] gabapentin (NEURONTIN) 600 MG tablet Take 600 mg by mouth 3 (three) times daily.   [name] guaifenesin (HUMIBID E) 400 MG TABS Take 1,200 mg by mouth 2 (two) times daily as needed. For congestion  . guaiFENesin (MUCINEX) 600 MG 12 hr tablet Take 1,200 mg by mouth 2 (two) times daily.  . hydrOXYzine  (ATARAX/VISTARIL) 50 MG tablet Take 50 mg by mouth at bedtime.  . hyoscyamine (LEVSIN SL) 0.125 MG SL tablet Place 0.125 mg under the tongue every 4 (four) hours as needed for cramping.  . Lactobacillus (ACIDOPHILUS PROBIOTIC PO) Take by mouth 3 (three) times daily.  [name] lidocaine (XYLOCAINE) 5 % ointment Apply topically daily.  [name] Lysine 500 MG CAPS Take by mouth daily.  . montelukast (SINGULAIR) 10 MG tablet Take 10 mg by mouth at bedtime.  . Multiple Vitamin (MULITIVITAMIN WITH MINERALS) TABS Take 1 tablet by mouth every evening.   . nitrofurantoin (MACRODANTIN) 50 MG capsule Take 50 mg by mouth daily as needed (UTI).  [name] oxybutynin (DITROPAN-XL) 5 MG 24 hr tablet Take 5 mg by mouth as needed.   [name] PROCTOZONE-HC 2.5 % rectal cream Apply topically as needed.  . progesterone (PROMETRIUM) 100 MG capsule Take 100 mg by mouth daily.   . Riboflavin (B2) 100 MG TABS Take by mouth daily.  . Simethicone 125 MG CAPS Take 125 mg by mouth 4 (four) times daily.  . TURMERIC PO Take by mouth daily.  . valACYclovir (VALTREX) 500 MG tablet TK 1 T PO QD  . VITAMIN A PO Take 3,000 mg by mouth daily.  . vitamin E (VITAMIN E) 400 UNIT capsule Take 800 Units by mouth daily.  . Zinc 25 MG TABS Take by mouth daily.  [name] azelastine (ASTELIN) 0.1 % nasal spray as directed.  . Biotin 10000 MCG TABS Take by mouth daily.  . butalbital-acetaminophen-caffeine (FIORICET, ESGIC) 50-325-40 MG tablet TK 1 T PO Q 6 H PRN  . Calcium-Phosphorus-Vitamin D (CALCIUM GUMMIES PO) Take 1 tablet by mouth every evening.  . diltiazem (CARDIZEM CD) 120 MG 24 hr capsule TAKE 1 CAPSULE(120 MG) BY MOUTH DAILY (Patient not taking: Reported on 11/19/2018)   Current Facility-Administered Medications (Other)  Medication Route  . betamethasone acetate-betamethasone sodium phosphate (CELESTONE) injection 3 mg Intramuscular      REVIEW OF SYSTEMS: ROS    Positive for: Neurological, Genitourinary, Musculoskeletal, HENT, Cardiovascular, Eyes,  Respiratory, Psychiatric   Negative for: Constitutional, Skin, Endocrine, Allergic/Imm, Heme/Lymph   Last edited by Elmore Guise, COT on 11/19/2018  2:38 PM. (History)       ALLERGIES Allergies  Allergen Reactions  . Amitriptyline Other (See Comments)    Other reaction(s): Other Change in mental status  . Amoxicillin Rash  . Bromelains     Other reaction(s): Abdominal pain  . Celexa [Citalopram Hydrobromide] Other (See Comments)    Other reaction(s): Other Change in mental status  . Ivp Dye  [Iodinated Diagnostic Agents] Anaphylaxis  . Nortriptyline     Other reaction(s): Other  . Penicillins Swelling and Rash  . Pregabalin     Other reaction(s): Myalgias (muscle pain)  . Bromelains [Pineapple Extract] Other (See Comments)    Severe pain  . Budesonide-Formoterol Fumarate  Other reaction(s): Other  . Citalopram Other (See Comments)  . Mangifera Indica Swelling  . Methylene Blue Other (See Comments)    Caused inflamed pancreas, per pt should not use  . Sertraline Other (See Comments)    Other reaction(s): Other Suicidal thoughts  . Sulfa Antibiotics Other (See Comments)  . Sulfonamide Derivatives Other (See Comments)    excrutiating pain, patient states it caused her to have osteoarthritis  . Bromaline [Albertsons Di Bromm] Rash  . Other Rash    Mango    PAST MEDICAL HISTORY Past Medical History:  Diagnosis Date  . Anxiety 12/14/2011  . Asthma    no inhaler  . Chest pain    a. 12/2015 CTA chest: no PE. No significant coronary Ca2+. Mosaic attenuation pattern in both lungs, may reflect small airway dzs; c. 12/2015 Ex MV: Walked 6 mins. No ischemia/scar.  . Diastolic dysfunction    a. 10/2015 Echo: EF nl, mod diast dysfxn. Mild MR/TR; b. 09/2016 Echo: EF 55-60%, no rwma. Nl RV size and fxn.  . Factor 5 Leiden mutation, heterozygous ([place])   . Fibromyalgia   . Fracture of 5th metatarsal 03/2011   left foot -    . Heart palpitations    a. Notes intermittent  tachycardia (sinus). Did not tolerate beta blockers 2/2 urinary incontinence.  . Interstitial cystitis   . Ovarian cyst   . Pancreatitis   . Pelvic floor dysfunction   . Rheumatoid arteritis ([place])   . Sinus infection    chronic  . Syncope    a. 08/2017 Event monitor: Average heart rate 77 (49-136).  Periods of sinus arrhythmia noted.  Triggered events corresponded with sinus rhythm, sinus arrhythmia, and artifact.  No significant ectopy, sustained arrhythmias, or prolonged pauses observed.  . Thrombosis    a. 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   Past Surgical History:  Procedure Laterality Date  . ABDOMINAL HYSTERECTOMY    . CHOLECYSTECTOMY    . CYSTOSCOPY WITH HYDRODISTENSION AND BIOPSY  04/21/2011   Dr Philis Fendt  . LAPAROSCOPIC ASSISTED VAGINAL HYSTERECTOMY  08/21/2011   Procedure: LAPAROSCOPIC ASSISTED VAGINAL HYSTERECTOMY;  Surgeon: Cyril Mourning, MD;  Location: [facility];  Service: Gynecology;  Laterality: N/A;  OPEN LAPAROSCOPIC  . LAPAROSCOPIC NISSEN FUNDOPLICATION  [id number]  . SALPINGOOPHORECTOMY  08/21/2011   Procedure: SALPINGO OOPHERECTOMY;  Surgeon: Cyril Mourning, MD;  Location: [facility];  Service: Gynecology;  Laterality: Bilateral;  . TUBAL LIGATION  2010  . WISDOM TOOTH EXTRACTION      FAMILY HISTORY Family History  Problem Relation Age of Onset  . Heart Problems Mother   . Vascular Disease Mother   . Vascular Disease Maternal Grandmother   . Anesthesia problems Neg Hx     SOCIAL HISTORY Social History   Tobacco Use  . Smoking status: Never Smoker  . Smokeless tobacco: Never Used  Substance Use Topics  . Alcohol use: No  . Drug use: No         OPHTHALMIC EXAM:  Base Eye Exam    Visual Acuity (Snellen - Linear)      Right Left   Dist cc 20/25-2 20/25-1   Dist ph cc 20/20-3 20/NI   Correction: Glasses       Tonometry (Tonopen, 2:46 PM)      Right Left    Pressure 14 15       Pupils  Dark Light Shape React APD   Right 4 3 Round Brisk None   Left 4 3 Round Brisk None       Visual Fields      Left Right    Full        Extraocular Movement      Right Left    Full, Ortho Full, Ortho       Neuro/Psych    Oriented x3: Yes   Mood/Affect: Normal       Dilation    Both eyes: 1.0% Mydriacyl, 2.5% Phenylephrine @ 2:46 PM        Slit Lamp and Fundus Exam    Slit Lamp Exam      Right Left   Lids/Lashes Dermatochalasis - upper lid, Meibomian gland dysfunction Dermatochalasis - upper lid, Meibomian gland dysfunction   Conjunctiva/Sclera White and quiet White and quiet   Cornea 2+ Punctate epithelial erosions greatest inferiorly, no guttata 1+ Punctate epithelial erosions greatest inferiorly, no guttata   Anterior Chamber Deep and quiet Deep and quiet   Iris Round and dilated Round and dilated   Lens 2+ Nuclear sclerosis, 1+ Cortical cataract 2+ Nuclear sclerosis, 1+ Cortical cataract   Vitreous Vitreous syneresis Vitreous syneresis       Fundus Exam      Right Left   Disc Pink and Sharp, mild tilt Pink and Sharp, inferior PPP   C/D Ratio 0.2 0.3   Macula Flat, Blunted foveal reflex, No heme or edema Flat, good foveal reflex, Retinal pigment epithelial mottling, No heme or edema   Vessels Mild Vascular attenuation, mild Tortuousity Mild Vascular attenuation, mild Tortuousity   Periphery Attached, mild inferior pavingstone degeneration Attached, pigmented pavingstone degeneration inferiorly, mild pavingstone degeneration        Refraction    Wearing Rx      Sphere Cylinder Axis   Right -5.50 +0.50 169   Left -6.00 +0.75 025       Manifest Refraction (Auto)      Sphere Cylinder Axis Dist VA   Right -4.00 +0.75 158 20/20   Left -3.25   20/20          IMAGING AND PROCEDURES  Imaging and Procedures for @TODAY @  OCT, Retina - OU - Both Eyes       Right Eye Quality was good. Central Foveal Thickness: 281.  Progression has no prior data. Findings include normal foveal contour, no IRF, no SRF.   Left Eye Quality was good. Central Foveal Thickness: 277. Progression has no prior data. Findings include normal foveal contour, no IRF, no SRF.   Notes *Images captured and stored on drive  Diagnosis / Impression:  NFP, no IRF/SRF OU  No pigmentary maculopathy or outer retinal atrophy -- no Elmiron toxicity  Clinical management:  See below  Abbreviations: NFP - Normal foveal profile. CME - cystoid macular edema. PED - pigment epithelial detachment. IRF - intraretinal fluid. SRF - subretinal fluid. EZ - ellipsoid zone. ERM - epiretinal membrane. ORA - outer retinal atrophy. ORT - outer retinal tubulation. SRHM - subretinal hyper-reflective material                 ASSESSMENT/PLAN:    ICD-10-CM   1. Retinal edema  H35.81 OCT, Retina - OU - Both Eyes  2. Myopia of both eyes with astigmatism  H52.13    H52.203   3. Combined forms of age-related cataract of both eyes  H25.813     1. No retinal edema on exam or  OCT  - no pigmentary maculopathy or degeneration consistent with Elmiron toxicity  - pt reports taking po Elmiron, 200 mg BID, since 2012 (8 yrs)  - OCT and autofluorescence without RPE atrophy  - recommend   2. Myopia w/ astigmatism OU  3. 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  - discussed diagnosis and progression  - not yet visually significant  - monitor for now   Ophthalmic Meds Ordered this visit:  No orders of the defined types were placed in this encounter.      Return in about 1 year (around 11/19/2019).  There are no Patient Instructions on file for this visit.   Explained the diagnoses, plan, and follow up with the patient and they expressed understanding.  Patient expressed understanding of the importance of proper follow up care. '  Gardiner Sleeper, M.D.,  Ph.D. Diseases & Surgery of the Retina and [place] 11/19/18  I have reviewed the above documentation for accuracy and completeness, and I agree with the above. Gardiner Sleeper, M.D., Ph.D. 11/19/18 10:29 PM    Abbreviations: M myopia (nearsighted); A astigmatism; H hyperopia (farsighted); P presbyopia; Mrx spectacle prescription;  CTL contact lenses; OD right eye; OS left eye; OU both eyes  XT exotropia; ET esotropia; PEK punctate epithelial keratitis; PEE punctate epithelial erosions; DES dry eye syndrome; MGD meibomian gland dysfunction; ATs artificial tears; PFAT's preservative free artificial tears; [place] nuclear sclerotic cataract; PSC posterior subcapsular cataract; ERM epi-retinal membrane; PVD posterior vitreous detachment; RD retinal detachment; DM diabetes mellitus; DR diabetic retinopathy; NPDR non-proliferative diabetic retinopathy; PDR proliferative diabetic retinopathy; CSME clinically significant macular edema; DME diabetic macular edema; dbh dot blot hemorrhages; CWS cotton wool spot; POAG primary open angle glaucoma; C/D cup-to-disc ratio; HVF humphrey visual field; GVF goldmann visual field; OCT optical coherence tomography; IOP intraocular pressure; BRVO Branch retinal vein occlusion; CRVO central retinal vein occlusion; CRAO central retinal artery occlusion; BRAO branch retinal artery occlusion; RT retinal tear; SB scleral buckle; PPV pars plana vitrectomy; VH Vitreous hemorrhage; PRP panretinal laser photocoagulation; IVK intravitreal kenalog; VMT vitreomacular traction; MH Macular hole;  NVD neovascularization of the disc; NVE neovascularization elsewhere; AREDS age related eye disease study; ARMD age related macular degeneration; POAG primary open angle glaucoma; EBMD epithelial/anterior basement membrane dystrophy; ACIOL anterior chamber intraocular lens; IOL intraocular lens; PCIOL posterior chamber intraocular lens; Phaco/IOL phacoemulsification  with intraocular lens placement; [place] photorefractive keratectomy; LASIK laser assisted in situ keratomileusis; HTN hypertension; DM diabetes mellitus; COPD chronic obstructive pulmonary disease

## 2018-11-19 ENCOUNTER — Ambulatory Visit (INDEPENDENT_AMBULATORY_CARE_PROVIDER_SITE_OTHER): Payer: Medicare Other | Admitting: Ophthalmology

## 2018-11-19 ENCOUNTER — Encounter (INDEPENDENT_AMBULATORY_CARE_PROVIDER_SITE_OTHER): Payer: Self-pay | Admitting: Ophthalmology

## 2018-11-19 DIAGNOSIS — H5213 Myopia, bilateral: Secondary | ICD-10-CM

## 2018-11-19 DIAGNOSIS — H52203 Unspecified astigmatism, bilateral: Secondary | ICD-10-CM | POA: Diagnosis not present

## 2018-11-19 DIAGNOSIS — H25813 Combined forms of age-related cataract, bilateral: Secondary | ICD-10-CM

## 2018-11-19 DIAGNOSIS — H3581 Retinal edema: Secondary | ICD-10-CM | POA: Diagnosis not present

## 2018-12-20 ENCOUNTER — Other Ambulatory Visit: Payer: Self-pay | Admitting: Internal Medicine

## 2018-12-20 ENCOUNTER — Ambulatory Visit
Admission: RE | Admit: 2018-12-20 | Discharge: 2018-12-20 | Disposition: A | Discharge status: Home / Self Care | Payer: Medicare Other | Source: Ambulatory Visit | Attending: Internal Medicine | Admitting: Internal Medicine

## 2018-12-20 DIAGNOSIS — I808 Phlebitis and thrombophlebitis of other sites: Secondary | ICD-10-CM

## 2018-12-25 ENCOUNTER — Telehealth: Payer: Self-pay | Admitting: Internal Medicine

## 2018-12-25 NOTE — Telephone Encounter (Signed)
Patient wants Dr. Marisue Humble advise and approval for pcp reading of recent U/s at [facility] and also meds .    Patient unable to supply additional information as she had to end call for another call from her pcp.

## 2018-12-30 NOTE — Telephone Encounter (Signed)
Spoke with patient. Recently she was at PCP, Dr Elijio Miles, and he ordered u/s of right side of her neck. There was no clot found. Patient was advised to contact her cardiologist for advice. She will feels it is protruding in that area at times. She would like Dr End to review the u/s as well for any concerns r/t to his expertise. Patient went on to discuss her multiple medical issues and had several questions. It seems that patient would benefit from an appointment with provider for further advice. Advised patient to remain in contact with her PCP for further advice on the u/s. Otherwise patient is scheduled to see Ryan on 01/09/19.

## 2019-01-02 NOTE — Progress Notes (Signed)
.    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   Past Medical History    Past Medical History:  Diagnosis Date  . Anxiety 12/14/2011  . Asthma    no inhaler  . Chest pain    a. 12/2015 CTA chest: no PE. No significant coronary Ca2+. Mosaic attenuation pattern in both lungs, may reflect small airway dzs; c. 12/2015 Ex MV: Walked 6 mins. No ischemia/scar.  . Diastolic dysfunction    a. 10/2015 Echo: EF nl, mod diast dysfxn. Mild MR/TR; b. 09/2016 Echo: EF 55-60%, no rwma. Nl RV size and fxn.  . Factor 5 Leiden mutation, heterozygous ([place])   . Fibromyalgia   . Fracture of 5th metatarsal 03/2011   left foot -    . Heart palpitations    a. Notes intermittent tachycardia (sinus). Did not tolerate beta blockers 2/2 urinary incontinence.  . Interstitial cystitis   . Ovarian cyst   . Pancreatitis   . Pelvic floor dysfunction   . Rheumatoid arteritis ([place])   . Sinus infection    chronic  . Syncope    a. 08/2017 Event monitor: Average heart rate 77 (49-136).  Periods of sinus arrhythmia noted.  Triggered events corresponded with sinus rhythm, sinus arrhythmia, and artifact.  No significant ectopy, sustained arrhythmias, or prolonged pauses observed.  . Thrombosis    a. 12/14/2011 Thrombosis of R gonadal vein with extension of thrombus into IVC to level of R renal vein per CT 12/11/12 of [place].   Past Surgical History:  Procedure Laterality Date  . ABDOMINAL HYSTERECTOMY    . CHOLECYSTECTOMY    . CYSTOSCOPY WITH HYDRODISTENSION AND BIOPSY  04/21/2011   Dr Philis Fendt  . LAPAROSCOPIC ASSISTED VAGINAL HYSTERECTOMY   08/21/2011   Procedure: LAPAROSCOPIC ASSISTED VAGINAL HYSTERECTOMY;  Surgeon: Cyril Mourning, MD;  Location: [facility];  Service: Gynecology;  Laterality: N/A;  OPEN LAPAROSCOPIC  . LAPAROSCOPIC NISSEN FUNDOPLICATION  [id number]  . SALPINGOOPHORECTOMY  08/21/2011   Procedure: SALPINGO OOPHERECTOMY;  Surgeon: Cyril Mourning, MD;  Location: [facility];  Service: Gynecology;  Laterality: Bilateral;  . TUBAL LIGATION  2010  . WISDOM TOOTH EXTRACTION      Allergies  Allergies  Allergen Reactions  . Amitriptyline Other (See Comments)    Other reaction(s): Other Change in mental status  . Amoxicillin Rash  . Bromelains     Other reaction(s): Abdominal pain  . Celexa [Citalopram Hydrobromide] Other (See Comments)    Other reaction(s): Other Change in mental status  . Ivp Dye  [Iodinated Diagnostic Agents] Anaphylaxis  . Nortriptyline     Other reaction(s): Other  . Penicillins Swelling and Rash  . Pregabalin     Other reaction(s): Myalgias (muscle pain)  . Bromelains [Pineapple Extract] Other (See Comments)    Severe pain  . Budesonide-Formoterol Fumarate     Other reaction(s): Other  . Citalopram Other (See Comments)  . Mangifera Indica Swelling  . Methylene Blue Other (See Comments)    Caused inflamed pancreas, per pt should not use  . Sertraline Other (See Comments)    Other reaction(s): Other Suicidal  thoughts  . Sulfa Antibiotics Other (See Comments)  . Sulfonamide Derivatives Other (See Comments)    excrutiating pain, patient states it caused her to have osteoarthritis  . Bromaline [Albertsons Di Bromm] Rash  . Other Rash    Mango    History of Present Illness    48 yo female with PMH as above. 2017 TTE showed normal LVSF with mild MR/TR. Stress test 2017 without ischemia. 2018 TTE EF 55-60%. 2019 monitor showed predominantly NSR. Followed by Dr. Murvin Natal at [facility] for Factor V mutations and previously on Xarelto, discontinued for anemia. She has a history of syncopal episodes in the  summer, She underwent bladder stimulator in Sept 2019 at [facility] and c/o CP in the PACU. EKG showed NSR and LVH with inferior infarct with age indeterminate. 10.2019 she presented with continued c/o intermittent fever and palpitations. No further testing or medication changes were recommended at that time. At her most recent f/u, she was doing well and felt her palpitations were connected to her bladder spasms and significantly improved by her bladder stimulator. She did note that the bladder stimulator would require pocket revision when COVID-19 allowed. She reported walking daily on her parent's farm without any limitations. She was suffering from a shingles flare, associated with diarrhea and low grade fever.   Back in early fall / September 2020, she started living with her Mom to avoid exposure to COVID-19. During that time, her husband tested COVID-19 positive and remained separate from her at their home in [place]. She reported eating a high salt diet over the next few months d/t her mother's cooking. She subsequently noticed SOB/DOE that was not alleviated with her usual inhalers. She also noted severe orthopnea, reportedly having to sleep sitting up in a recliner. She felt increasingly fatigued with active shingles outbreak. She also noted a distended right jugular vein that became worse with talking or laughing. She continued to take lasix 20mg  daily without any noted LEE and diltiazem without racing HR or palpitations. She does not feel she's had CP. She noticed gradual weight gain on a home scale; however, she attributed that to her mother's cooking / caloric intake.Today, she presents to clinic for a second opinion after having seen her PCP. At that time, labs were stable other than slightly high sodium and low BUN. Subsequent duplex ultrasound was performed of the RUE and, per patient, without significant findings..Today, she notes concern regarding a day in which she noted erythema, warmth, and  pain / burning with even worse jugular distention. She still suffers from active right sided shingles outbreak with blisters along her R face and ear and associated paresthesias.   Home Medications    Prior to Admission medications   Medication Sig Start Date End Date Taking? Authorizing Provider  albuterol (PROVENTIL HFA;VENTOLIN HFA) 108 (90 BASE) MCG/ACT inhaler Inhale 1 puff into the lungs as needed. For shortness of breath or wheezing    [provider]  azelastine (ASTELIN) 0.1 % nasal spray as directed. 09/13/18   [provider]  AZELASTINE HCL NA Place into the nose daily.    [provider]  baclofen (LIORESAL) 20 MG tablet Take 20 mg by mouth 3 (three) times daily.    [provider]  Biotin 10000 MCG TABS Take by mouth daily.    [provider]  butalbital-acetaminophen-caffeine (FIORICET, ESGIC) 50-325-40 MG tablet TK 1 T PO Q 6 H PRN 05/31/16   [provider]  Calcium-Phosphorus-Vitamin D (CALCIUM GUMMIES PO) Take  1 tablet by mouth every evening.    [provider]  Cholecalciferol (VITAMIN D3) 5000 units TABS Take by mouth daily.    [provider]  co-enzyme Q-10 30 MG capsule Take 30 mg by mouth daily.    [provider]  Cyanocobalamin 5000 MCG SUBL Place 5,000 mcg under the tongue daily.     [provider]  diazepam (VALIUM) 10 MG tablet Take 10 mg by mouth daily.    [provider]  diltiazem (CARDIZEM CD) 120 MG 24 hr capsule TAKE 1 CAPSULE(120 MG) BY MOUTH DAILY Patient not taking: Reported on 11/19/2018 09/06/18   Theora Gianotti, NP  ELMIRON 100 MG capsule Take 200 mg by mouth 2 (two) times daily. 09/26/16   [provider]  Fexofenadine HCl (ALLEGRA PO) Take 1 tablet by mouth daily as needed. For allergies/sinues    [provider]  fluconazole (DIFLUCAN) 150 MG tablet Take 150 mg by mouth daily as needed.  08/14/16   [provider]   fluticasone (FLONASE) 50 MCG/ACT nasal spray SHAKE LQ AND U 1 TO 2 SPRAYS IEN QD UTD 06/03/16   [provider]  Fluticasone-Salmeterol (ADVAIR) 250-50 MCG/DOSE AEPB Inhale 1 puff into the lungs 2 (two) times a day.     [provider]  furosemide (LASIX) 20 MG tablet Take 20 mg by mouth daily.    [provider]  gabapentin (NEURONTIN) 600 MG tablet Take 600 mg by mouth 3 (three) times daily.  09/26/16   [provider]  guaifenesin (HUMIBID E) 400 MG TABS Take 1,200 mg by mouth 2 (two) times daily as needed. For congestion    [provider]  guaiFENesin (MUCINEX) 600 MG 12 hr tablet Take 1,200 mg by mouth 2 (two) times daily.    [provider]  hydrOXYzine (ATARAX/VISTARIL) 50 MG tablet Take 50 mg by mouth at bedtime.    [provider]  hyoscyamine (LEVSIN SL) 0.125 MG SL tablet Place 0.125 mg under the tongue every 4 (four) hours as needed for cramping.    [provider]  Lactobacillus (ACIDOPHILUS PROBIOTIC PO) Take by mouth 3 (three) times daily.    [provider]  lidocaine (XYLOCAINE) 5 % ointment Apply topically daily. 09/26/16   [provider]  Lysine 500 MG CAPS Take by mouth daily.    [provider]  montelukast (SINGULAIR) 10 MG tablet Take 10 mg by mouth at bedtime.    [provider]  Multiple Vitamin (MULITIVITAMIN WITH MINERALS) TABS Take 1 tablet by mouth every evening.     [provider]  nitrofurantoin (MACRODANTIN) 50 MG capsule Take 50 mg by mouth daily as needed (UTI).    [provider]  oxybutynin (DITROPAN-XL) 5 MG 24 hr tablet Take 5 mg by mouth as needed.     [provider]  PROCTOZONE-HC 2.5 % rectal cream Apply topically as needed. 09/26/16   [provider]  progesterone (PROMETRIUM) 100 MG capsule Take 100 mg by mouth daily.  08/03/16   [provider]  Riboflavin (B2) 100 MG TABS Take by mouth daily.    [provider]  Simethicone 125 MG CAPS Take 125 mg by mouth 4 (four) times daily.    [provider]  TURMERIC PO Take by mouth daily.    [provider]  valACYclovir (VALTREX) 500 MG tablet TK 1 T PO QD 06/07/16   [provider]  VITAMIN A PO Take 3,000 mg  by mouth daily.    [provider]  vitamin E (VITAMIN E) 400 UNIT capsule Take 800 Units by mouth daily.    [provider]  Zinc 25 MG TABS Take by mouth daily.    [provider]    Review of Systems    She denies chest pain, palpitations, pnd, n, v, presyncope, syncope, edema, or early satiety. .She reports SOB/DOE, weight gain, and orthopnea. All other systems reviewed and are otherwise negative except as noted above.  Physical Exam    VS:  BP 120/78 (BP Location: Left Arm, Patient Position: Sitting, Cuff Size: Normal)   Pulse 72   Temp 98.4 F (36.9 C)   Ht 5\' 5"  (1.651 m)   Wt 179 lb 8 oz (81.4 kg)   LMP 05/20/2011   BMI 29.87 kg/m  , BMI Body mass index is 29.87 kg/m. GEN: Female, in no acute distress. HEENT: normal. Neck: Supple, R distended jugular vein. No carotid bruits  or masses. Cardiac: RRR, no murmurs, rubs, or gallops. No clubbing, cyanosis, edema.  Radials/DP/PT +1 and equal bilaterally.  Respiratory:  Respirations regular and unlabored, clear to auscultation bilaterally. GI: Soft, nontender, nondistended, BS + x 4. MS: no deformity or atrophy. Skin: warm and dry, no rash. Neuro:  Strength and sensation are intact. Psych: Normal affect.  Accessory Clinical Findings    ECG - NSR, 72bpm, PR interval 133ms, no acute ST/T changes   The following studies were reviewed today:   30-day event monitor (09/20/2017): Predominantly sinus rhythm without significant arrhythmia.  TTE (10/16/16): Normal LV size and function. LVEF 55-60% with normal wall motion and diastolic parameters. Normal RV size and function.  Exercise myocardial perfusion stress test  (12/16/15): Normal study. No evidence of ischemia or scar. Patient exercised 6 minutes achieving 7 METs with peak heart rate of 151 bpm (85% MPHR).  CTA chest (12/06/15): No pulmonary embolism. No significant coronary artery calcification. Mosaic attenuation pattern in both lungs that may reflect small airway disease.  Transthoracic echocardiogram (10/06/15, [place]): Normal LV function with moderate diastolic dysfunction. Mild mitral and tricuspid regurgitation.  Assessment & Plan    Distended R Jugular Vein --R venous distention, made worse with increase of pressure 2/2 talking, laughing, lifting, bearing down. 1 day of pain and erythema but otherwise not painful. Given her symptoms reported above and including wt gain / orhopnea / DOE / SOB, reasonable to obtain an echo to rule out right heart failure. No goiter noted on exam with TSH check by PCP and wnl. We discussed that decreasing salt may improve the distention, which is likely worse with increase of volume status. If EF reduced, will need further workup with stress +/- cath. If severely elevated PASP or findings of acute right heart failure on echo, consider pulmonary embolism. Based on history, EKG, vitals and physical exam today, low suspicion for pulmonary embolism over that of pulmonary dz. Also considered is SVC syndrome. Further recommendations pending echo. If echo normal, no further workup needed at that time.   Palpitations, Racing HR --Continues to deny racing HR and palpitations, attributing improvement to bladder stimulator. Continue diltiazem.  History of syncope/near syncope --No report of recent syncope or near syncope with previous event monitor showing NSR and without arrhythmia and EKG NSR today. No further workup at this time.  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 Duplex study  performed of RUE and without significant findings, per patient.  Fibromyalgia Shingles outbreak, R side --Consider as contributing to neck discomfort and symptoms.   Asthma --Consider pulmonary etiology of symptoms, as noted above. Continue inhalers.   Disposition: No medication changes. Obtain echo. Follow-up to be adjusted based on echo results.    Arvil Chaco, PA-C 01/10/2019, 6:58 AM Pager [phone]

## 2019-01-07 NOTE — Telephone Encounter (Signed)
Routing to Dr. Saunders Revel for input.

## 2019-01-07 NOTE — Telephone Encounter (Signed)
I have reviewed her recent right upper extremity ultrasound report, which showed no evidence of DVT or other notable abnormality.  It is difficult to know what structure April [name] is referring to in her right neck.  I think it is reasonable for her to be seen by Christell Faith, PA, as scheduled later this week to determine if/what additional testing may be necessary.  [name], MD [facility]

## 2019-01-08 NOTE — Telephone Encounter (Signed)
Call to patient. Discussed note from Dr. Saunders Revel. I confirmed appt for 12/10.  No further questions at this time.

## 2019-01-09 ENCOUNTER — Ambulatory Visit (INDEPENDENT_AMBULATORY_CARE_PROVIDER_SITE_OTHER): Payer: Medicare Other | Admitting: Physician Assistant

## 2019-01-09 ENCOUNTER — Other Ambulatory Visit: Payer: Self-pay

## 2019-01-09 ENCOUNTER — Encounter: Payer: Self-pay | Admitting: Physician Assistant

## 2019-01-09 VITALS — BP 120/78 | HR 72 | Temp 98.4°F | Ht 65.0 in | Wt 179.5 lb

## 2019-01-09 DIAGNOSIS — M797 Fibromyalgia: Secondary | ICD-10-CM

## 2019-01-09 DIAGNOSIS — R0600 Dyspnea, unspecified: Secondary | ICD-10-CM

## 2019-01-09 DIAGNOSIS — Z86718 Personal history of other venous thrombosis and embolism: Secondary | ICD-10-CM

## 2019-01-09 DIAGNOSIS — R0602 Shortness of breath: Secondary | ICD-10-CM

## 2019-01-09 DIAGNOSIS — R221 Localized swelling, mass and lump, neck: Secondary | ICD-10-CM

## 2019-01-09 DIAGNOSIS — M542 Cervicalgia: Secondary | ICD-10-CM | POA: Diagnosis not present

## 2019-01-09 DIAGNOSIS — J4520 Mild intermittent asthma, uncomplicated: Secondary | ICD-10-CM

## 2019-01-09 DIAGNOSIS — B028 Zoster with other complications: Secondary | ICD-10-CM

## 2019-01-09 NOTE — Patient Instructions (Signed)
Medication Instructions:  Your physician recommends that you continue on your current medications as directed. Please refer to the Current Medication list given to you today.  *If you need a refill on your cardiac medications before your next appointment, please call your pharmacy*  Lab Work: None ordered  If you have labs (blood work) drawn today and your tests are completely normal, you will receive your results only by: [name] MyChart Message (if you have MyChart) OR . A paper copy in the mail If you have any lab test that is abnormal or we need to change your treatment, we will call you to review the results.  Testing/Procedures: 1- Echo  Please return to [facility] on ______________ at _______________ AM/PM for an Echocardiogram. Your physician has requested that you have an echocardiogram. Echocardiography is a painless test that uses sound waves to create images of your heart. It provides your doctor with information about the size and shape of your heart and how well your heart's chambers and valves are working. This procedure takes approximately one hour. There are no restrictions for this procedure. Please note; depending on visual quality an IV may need to be placed.   2- Your physician has requested that you have a carotid duplex. This test is an ultrasound of the carotid arteries in your neck. It looks at blood flow through these arteries that supply the brain with blood. Allow one hour for this exam. There are no restrictions or special instructions.    Follow-Up: At [facility], you and your health needs are our priority.  As part of our continuing mission to provide you with exceptional heart care, we have created designated Provider Care Teams.  These Care Teams include your primary Cardiologist (physician) and Advanced Practice Providers (APPs -  Physician Assistants and Nurse Practitioners) who all work together to provide you with the care you need, when you  need it.  Your next appointment:   4 month(s)  The format for your next appointment:   In Person  Provider:    You may see [name], MD or Marrianne Mood, PA-C.

## 2019-01-22 NOTE — Telephone Encounter (Signed)
Patient calling in to state that she had the carotid ultrasound with her primary. Patient is now unsure what her next step should be. Patient is under the impression since she already had the test now insurance will not pay for it. Patient is still complaining of pain. Please advise patient when able

## 2019-01-23 NOTE — Telephone Encounter (Signed)
The only ultrasound result that I see was of a right upper extremity venous study, which is not the same as a carotid Doppler.  If she has had additional testing done by her PCP (including a carotid Doppler), she should reach out to her PCP for further instructions.  Otherwise, I recommend that she proceed with the carotid Doppler as ordered by Ms. Visser.  [name], MD [facility]

## 2019-01-23 NOTE — Telephone Encounter (Signed)
I spoke with the patient.  She states she had a carotid US & CT scan at her PCP office on 12/15 & 12/16. She is still having pain in her neck radiating down to her arms.  She is requesting we obtain these reports from her PCP for Dr. Saunders Revel to review.  She states she asked her PCP to send these to [place] and she was told we would need to request these.   I called and spoke with her PCP office and they are going to fax her [place] & CT scan from last week to [place]. Fax confirmed with PCP staff.   Will await report.

## 2019-01-23 NOTE — Telephone Encounter (Signed)
CT chest and Carotid US report received from the PCP office. I have notified the patient of this and will leave these for Dr. Erby Pian, RN to review next week.  She is aware I have cancelled her carotid US in our office for Monday.   CT/ carotid report placed on April [name]'s desk.

## 2019-01-23 NOTE — Telephone Encounter (Signed)
I will review imaging results next week.  In the meantime, I will defer management to Ms. Ligas's PCP.  [name], MD [facility]

## 2019-01-27 ENCOUNTER — Encounter

## 2019-01-28 NOTE — Telephone Encounter (Signed)
Carotid duplex and CT chest results from PCP placed on Dr Darnelle Bos desk for his review.

## 2019-01-30 ENCOUNTER — Other Ambulatory Visit (HOSPITAL_COMMUNITY): Payer: Self-pay | Admitting: Physician Assistant

## 2019-01-30 DIAGNOSIS — M4712 Other spondylosis with myelopathy, cervical region: Secondary | ICD-10-CM

## 2019-01-30 NOTE — Telephone Encounter (Signed)
I have reviewed outside imaging reports for carotid Doppler dated 01/15/2019 and chest CT dated 01/14/2019.  Both reports do not indicate any significant abnormality.  I am not able to offer further recommendations, as I do not have access to the images.  I recommend that April [name] proceed with echo as planned to excluded heart failure contributing to JVD.  If the echo is normal, I do not believe that there is a cardiac etiology for her neck concerns.  April Bush, MD [facility]

## 2019-01-30 NOTE — Telephone Encounter (Signed)
Called patient and she verbalized understanding of Dr Darnelle Bos recommendations.  She will continue with plan of echo and f/u thereafter. Patient states she went to [facility] and after xray was discovered to have bone spurs on neck. That doctor thinks some "blood flow" issues are do to the compression and inflammation of her vertebrae.  Patient will continue to follow up with her other providers as they advise.

## 2019-02-10 ENCOUNTER — Telehealth: Payer: Self-pay | Admitting: Internal Medicine

## 2019-02-10 NOTE — Telephone Encounter (Signed)
Patient having an echo tomorrow and wants to make sure her medication reaction of swelling and ear ringing  from  Pain medications rxd by ortho

## 2019-02-11 ENCOUNTER — Other Ambulatory Visit: Payer: Self-pay

## 2019-02-11 ENCOUNTER — Ambulatory Visit (INDEPENDENT_AMBULATORY_CARE_PROVIDER_SITE_OTHER): Payer: Medicare Other

## 2019-02-11 DIAGNOSIS — R0600 Dyspnea, unspecified: Secondary | ICD-10-CM

## 2019-02-11 NOTE — Telephone Encounter (Signed)
I spoke with the patient. She states she was started on Medrol/ tramadol/ cyclobenzaprine for her bone spurs in her neck. She was reading that one of the meds could cause some abnormality in the electrical activity of her heart and she wanted to make sure she would be ok to still come in for her echo today.  I advised the patient that the echo is looking mainly at structure of the heart vs electrical activity of the heart and that she should be fine to have her echo done at 3:00 pm today.  The patient voices understanding and is agreeable.

## 2019-02-11 NOTE — Telephone Encounter (Signed)
Patient wanting to make sure it is ok to still do echo.

## 2019-02-12 ENCOUNTER — Ambulatory Visit (HOSPITAL_COMMUNITY)
Admission: RE | Admit: 2019-02-12 | Discharge: 2019-02-12 | Disposition: A | Discharge status: Home / Self Care | Payer: Medicare Other | Source: Ambulatory Visit | Attending: Physician Assistant | Admitting: Physician Assistant

## 2019-02-12 DIAGNOSIS — M4712 Other spondylosis with myelopathy, cervical region: Secondary | ICD-10-CM | POA: Diagnosis not present

## 2019-04-24 ENCOUNTER — Other Ambulatory Visit: Payer: Self-pay | Admitting: Physical Medicine and Rehabilitation

## 2019-04-24 ENCOUNTER — Other Ambulatory Visit (HOSPITAL_COMMUNITY): Payer: Self-pay | Admitting: Physical Medicine and Rehabilitation

## 2019-04-24 ENCOUNTER — Telehealth: Payer: Self-pay | Admitting: Internal Medicine

## 2019-04-24 DIAGNOSIS — M546 Pain in thoracic spine: Secondary | ICD-10-CM

## 2019-04-24 NOTE — Telephone Encounter (Signed)
Patient has appointment on May 16, 2019. It does not appear that patient needs to come in earlier at this time unless having new or worsening symptoms. If so, please schedule at next available. You may transfer back to nurse if patient has any further questions or concerns.

## 2019-04-24 NOTE — Telephone Encounter (Signed)
Patient had mri for [facility] and wants Dr. Saunders Revel to review for potential urgent fu with cardiology.  Mri in epic and was done in January .

## 2019-04-25 NOTE — Telephone Encounter (Signed)
Patient is concerned of increased bp ( 142/89 )  and thickening results on Echo recently .   Added to wait list patient wants Dr. Saunders Revel only .

## 2019-04-25 NOTE — Telephone Encounter (Signed)
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  However if she remains concerned that it is causing symptoms of some sort, I encourage her to speak with her PCP about a neurology referral.  It is reasonable for her to follow-up with [place] as previously scheduled.  [name], MD [facility]

## 2019-04-25 NOTE — Telephone Encounter (Signed)
Patient has MR Cervical Spine 02/12/19. Patient is concerned about where is says "The left vertebral artery is hypoplastic." She's afraid of having a stroke from the research she has done.  Advised that I do not have an earlier opening with Dr End at this time but will put her on the waiting list. Patient already scheduled to see Dr End on 05/16/19. I will route to Dr End for his review and any recommendations that would need prior to appt if necessary.

## 2019-04-28 NOTE — Telephone Encounter (Signed)
Patient notified and verbalized understanding of the recommendations.

## 2019-05-09 ENCOUNTER — Other Ambulatory Visit: Payer: Self-pay

## 2019-05-09 ENCOUNTER — Ambulatory Visit (HOSPITAL_COMMUNITY)
Admission: RE | Admit: 2019-05-09 | Discharge: 2019-05-09 | Disposition: A | Discharge status: Home / Self Care | Payer: Medicare Other | Source: Ambulatory Visit | Attending: Physical Medicine and Rehabilitation | Admitting: Physical Medicine and Rehabilitation

## 2019-05-09 DIAGNOSIS — M546 Pain in thoracic spine: Secondary | ICD-10-CM

## 2019-05-16 ENCOUNTER — Ambulatory Visit: Payer: Medicare Other | Admitting: Internal Medicine

## 2019-06-13 ENCOUNTER — Ambulatory Visit: Payer: Medicare Other | Admitting: Internal Medicine

## 2019-06-13 NOTE — Progress Notes (Deleted)
Follow-up Outpatient Visit Date: 06/13/2019  Primary Care Provider: Jodi Marble, MD [place] [id number]  Chief Complaint: ***  HPI:  Ms. Tippery is a 49 y.o. 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 Bynum Bellows @ [place]) whowas on Xarelto for this(discontinued due to severe anemia), fibromyalgia, asthma andrheumatoid arthritis, who presents for follow-up of chest pain.  She was last seen in our office in December by Marrianne Mood, PA, at which time she complained of increased shortness of breath that she attributed to increased salt intake.  She also noticed a prominent blood vessel in her neck.  Ultrasound examination performed by her PCP was unrevealing.  She was also dealing with shingles around that time.  Subsequent echo was normal.  --------------------------------------------------------------------------------------------------  Cardiovascular History & Procedures: Cardiovascular Problems:  Atypical chest pain  Diastolic dysfunction (resolved on most recent echo)  Palpitations and questionable syncope  Factor V Leiden mutation with history of gonadal vein thrombus extending into IVC  Risk Factors:  Hypercoagulable state  Cath/PCI:  None  CV Surgery:  Lower extremity venous ablation (12/2015, Dr.Dew)  EP Procedures and Devices:  None  Non-Invasive Evaluation(s):  TTE (02/11/19): Normal LV size with borderline LVH.  LVEF 55-60% with normal wall motion.  Normal RV size and function.  No significant valvular abnormality.  Carotid Doppler (01/15/19): No hemodynamically significant stenosis in either carotid artery.  Right upper extremity venous duplex (12/20/18): No evidence of DVT in the right upper extremity.  TTE (10/16/16): Normal LV size and function.  LVEF 55-60% with normal wall motion and diastolic parameters.  Normal RV size and  function.  Exercise myocardial perfusion stress test (12/16/15): Normal study. No evidence of ischemia or scar. Patient exercised 6 minutes achieving 7 METs with peak heart rate of 151 bpm (85% MPHR).  CTA chest (12/06/15): No pulmonary embolism. No significant coronary artery calcification. Mosaic attenuation pattern in both lungs that may reflect small airway disease.  Transthoracic echocardiogram (10/06/15, [place]): Normal LV function with moderate diastolic dysfunction. Mild mitral and tricuspid regurgitation.  Recent CV Pertinent Labs: Lab Results  Component Value Date   INR 1.13 12/15/2011   K 4.9 07/13/2016   K 3.3 (L) 07/15/2013   MG 2.2 07/13/2016   BUN 5 (L) 07/13/2016   BUN 7 07/15/2013   CREATININE 0.77 07/13/2016   CREATININE 0.96 07/15/2013    Past medical and surgical history were reviewed and updated in EPIC.  No outpatient medications have been marked as taking for the 06/13/19 encounter (Appointment) with Deshawnda Acrey, Harrell Gave, MD.   Current Facility-Administered Medications for the 06/13/19 encounter (Appointment) with Daylyn Christine, Harrell Gave, MD  Medication  . betamethasone acetate-betamethasone sodium phosphate (CELESTONE) injection 3 mg    Allergies: Amitriptyline, Amoxicillin, Bromelains, Celexa [citalopram hydrobromide], Ivp dye  [iodinated diagnostic agents], Nortriptyline, Penicillins, Pregabalin, Bromelains [pineapple extract], Budesonide-formoterol fumarate, Citalopram, Mangifera indica, Methylene blue, Sertraline, Sulfa antibiotics, Sulfonamide derivatives, Bromaline [albertsons di bromm], and Other  Social History   Tobacco Use  . Smoking status: Never Smoker  . Smokeless tobacco: Never Used  Substance Use Topics  . Alcohol use: No  . Drug use: No    Family History  Problem Relation Age of Onset  . Heart Problems Mother   . Vascular Disease Mother   . Vascular Disease Maternal Grandmother   . Anesthesia problems Neg Hx     Review of  Systems: A 12-system review of  systems was performed and was negative except as noted in the HPI.  --------------------------------------------------------------------------------------------------  Physical Exam: LMP 05/20/2011   General:  *** HEENT: No conjunctival pallor or scleral icterus. Facemask in place. Neck: Supple without lymphadenopathy, thyromegaly, JVD, or HJR. Lungs: Normal work of breathing. Clear to auscultation bilaterally without wheezes or crackles. Heart: Regular rate and rhythm without murmurs, rubs, or gallops. Non-displaced PMI. Abd: Bowel sounds present. Soft, NT/ND without hepatosplenomegaly Ext: No lower extremity edema. Radial, PT, and DP pulses are 2+ bilaterally. Skin: Warm and dry without rash.  EKG:  ***  Lab Results  Component Value Date   WBC 6.8 07/13/2016   HGB 13.0 07/13/2016   HCT 39.0 07/13/2016   MCV 94 07/13/2016   PLT 258 07/13/2016    Lab Results  Component Value Date   NA 142 07/13/2016   K 4.9 07/13/2016   CL 102 07/13/2016   CO2 22 07/13/2016   BUN 5 (L) 07/13/2016   CREATININE 0.77 07/13/2016   GLUCOSE 92 07/13/2016   ALT 35 (H) 07/13/2016    No results found for: CHOL, HDL, LDLCALC, LDLDIRECT, TRIG, CHOLHDL  --------------------------------------------------------------------------------------------------  ASSESSMENT AND PLAN: ***  [name], MD 06/13/2019 7:33 AM

## 2019-06-18 ENCOUNTER — Ambulatory Visit (INDEPENDENT_AMBULATORY_CARE_PROVIDER_SITE_OTHER): Payer: Medicare Other | Admitting: Internal Medicine

## 2019-06-18 ENCOUNTER — Other Ambulatory Visit: Payer: Self-pay

## 2019-06-18 ENCOUNTER — Encounter: Payer: Self-pay | Admitting: Internal Medicine

## 2019-06-18 VITALS — BP 114/82 | HR 69 | Ht 65.0 in | Wt 181.0 lb

## 2019-06-18 DIAGNOSIS — R002 Palpitations: Secondary | ICD-10-CM

## 2019-06-18 NOTE — Progress Notes (Signed)
Follow-up Outpatient Visit Date: 06/18/2019  Primary Care Provider: Jodi Marble, MD [place] [address]  Chief Complaint: Neck pain  HPI:  April [name] is a 49 y.o. 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 Bynum [name] @ [place]) whowas on Xarelto for this(discontinued due to severe anemia), fibromyalgia, asthma andrheumatoid arthritis, who presents for follow-up of chest pain.  She was last seen in our office in December by April Mood, PA, at which time she complained of increased shortness of breath that she attributed to increased salt intake.  She also noticed a prominent blood vessel in her neck.  Ultrasound examination performed by her PCP was unrevealing.  She was also dealing with shingles around that time.  Subsequent echo was normal.  Today, April [name] has multiple complaints.  She notes that she had a tooth extracted yesterday and is in need of considerable additional dental work.  She was told in the past that dental disease could be a risk factor for heart disease and is concerned about this.  She continues to notice prominence of a vein in her right neck.  She has experienced intermittent pain in that area that she attributes to a muscle at times, bone spur, and also shingles.  Palpitations have continued, and April [name] feels like her episodes of palpitations are increasing.  They typically last less than 10 minutes and are without associated symptoms.  She denies chest pain, shortness of breath, and lightheadedness.  She feels like she is not urinating as much as she used to and is concerned about retaining fluid.  She notes some intermittent swelling in her legs.  She remains compliant with her medications.  --------------------------------------------------------------------------------------------------  Cardiovascular History & Procedures: Cardiovascular  Problems:  Atypical chest pain  Diastolic dysfunction (resolved on most recent echo)  Palpitations and questionable syncope  Factor V Leiden mutation with history of gonadal vein thrombus extending into IVC  Risk Factors:  Hypercoagulable state  Cath/PCI:  None  CV Surgery:  Lower extremity venous ablation (12/2015, Dr.Dew)  EP Procedures and Devices:  30-day event monitor (09/20/17): Predominantly sinus rhythm without significant arrhythmias.  Non-Invasive Evaluation(s):  TTE (02/11/19): Normal LV size with borderline LVH.  LVEF 55-60% with normal wall motion.  Normal RV size and function.  No significant valvular abnormality.  Carotid Doppler (01/15/19): No hemodynamically significant stenosis in either carotid artery.  Right upper extremity venous duplex (12/20/18): No evidence of DVT in the right upper extremity.  TTE (10/16/16): Normal LV size and function.  LVEF 55-60% with normal wall motion and diastolic parameters.  Normal RV size and function.  Exercise myocardial perfusion stress test (12/16/15): Normal study. No evidence of ischemia or scar. Patient exercised 6 minutes achieving 7 METs with peak heart rate of 151 bpm (85% MPHR).  CTA chest (12/06/15): No pulmonary embolism. No significant coronary artery calcification. Mosaic attenuation pattern in both lungs that may reflect small airway disease.  Transthoracic echocardiogram (10/06/15, [facility]): Normal LV function with moderate diastolic dysfunction. Mild mitral and tricuspid regurgitation.  Recent CV Pertinent Labs: Lab Results  Component Value Date   INR 1.13 12/15/2011   K 4.9 07/13/2016   K 3.3 (L) 07/15/2013   MG 2.2 07/13/2016   BUN 5 (L) 07/13/2016   BUN 7 07/15/2013   CREATININE 0.77 07/13/2016   CREATININE 0.96 07/15/2013    Past medical and surgical history were reviewed and  updated in EPIC.  Current Meds  Medication Sig  . albuterol (PROVENTIL HFA;VENTOLIN HFA)  108 (90 BASE) MCG/ACT inhaler Inhale 1 puff into the lungs as needed. For shortness of breath or wheezing  . Ascorbic Acid (VITA-C PO) Take by mouth.  [name] azelastine (ASTELIN) 0.1 % nasal spray as directed.  . baclofen (LIORESAL) 20 MG tablet Take 20 mg by mouth 3 (three) times daily.  . Biotin 10000 MCG TABS Take by mouth daily.  . butalbital-acetaminophen-caffeine (FIORICET, ESGIC) 50-325-40 MG tablet TK 1 T PO Q 6 H PRN  . Calcium-Phosphorus-Vitamin D (CALCIUM GUMMIES PO) Take 1 tablet by mouth every evening.  . Cholecalciferol (VITAMIN D3) 5000 units TABS Take by mouth daily.  [name] co-enzyme Q-10 30 MG capsule Take 30 mg by mouth daily.  . Cyanocobalamin 5000 MCG SUBL Place 5,000 mcg under the tongue daily.   . diazepam (VALIUM) 10 MG tablet Take 10 mg by mouth daily.  [name] dicyclomine (BENTYL) 20 MG tablet Take 20 mg by mouth every 6 (six) hours.  [name] diltiazem (CARDIZEM CD) 120 MG 24 hr capsule TAKE 1 CAPSULE(120 MG) BY MOUTH DAILY  . ELMIRON 100 MG capsule Take 200 mg by mouth 2 (two) times daily.  [name] Fexofenadine HCl (ALLEGRA PO) Take 1 tablet by mouth daily as needed. For allergies/sinues  . fluconazole (DIFLUCAN) 150 MG tablet Take 150 mg by mouth daily as needed.   . fluticasone (FLONASE) 50 MCG/ACT nasal spray SHAKE LQ AND U 1 TO 2 SPRAYS IEN QD UTD  . Fluticasone-Salmeterol (ADVAIR) 250-50 MCG/DOSE AEPB Inhale 1 puff into the lungs 2 (two) times a day.   . furosemide (LASIX) 20 MG tablet Take 20 mg by mouth daily.  [name] guaiFENesin (MUCINEX) 600 MG 12 hr tablet Take 1,200 mg by mouth 2 (two) times daily.  . hydrOXYzine (ATARAX/VISTARIL) 50 MG tablet Take 50 mg by mouth at bedtime.  . hyoscyamine (LEVSIN SL) 0.125 MG SL tablet Place 0.125 mg under the tongue every 4 (four) hours as needed for cramping.  . Lactobacillus (ACIDOPHILUS PROBIOTIC PO) Take by mouth 3 (three) times daily.  [name] lidocaine (XYLOCAINE) 5 % ointment Apply topically daily.  [name] Lysine 500 MG CAPS Take by mouth daily.  .  montelukast (SINGULAIR) 10 MG tablet Take 10 mg by mouth at bedtime.  . Multiple Vitamin (MULITIVITAMIN WITH MINERALS) TABS Take 1 tablet by mouth every evening.   . nitrofurantoin (MACRODANTIN) 50 MG capsule Take 50 mg by mouth at bedtime.   [name] oxybutynin (DITROPAN-XL) 5 MG 24 hr tablet Take 5 mg by mouth as needed.   [name] PROCTOZONE-HC 2.5 % rectal cream Apply topically as needed.  . progesterone (PROMETRIUM) 100 MG capsule Take 100 mg by mouth daily.   . Riboflavin (B2) 100 MG TABS Take by mouth daily.  . Simethicone 125 MG CAPS Take 125 mg by mouth 4 (four) times daily.  . TURMERIC PO Take by mouth daily.  [name] VALACYCLOVIR HCL PO Take 1,000 mg by mouth daily.   [name] VITAMIN A PO Take 3,000 mg by mouth daily.  . Zinc 25 MG TABS Take by mouth daily.   Current Facility-Administered Medications for the 06/18/19 encounter (Office Visit) with Jaileen Janelle, Harrell Gave, MD  Medication  . betamethasone acetate-betamethasone sodium phosphate (CELESTONE) injection 3 mg    Allergies: Amitriptyline, Amoxicillin, Bromelains, Celexa [citalopram hydrobromide], Ivp dye  [iodinated diagnostic agents], Nortriptyline, Penicillins, Pregabalin, Bromelains [pineapple extract], Budesonide-formoterol fumarate, Citalopram, Mangifera indica, Methylene blue, Sertraline, Sulfa antibiotics, Sulfonamide derivatives, Bromaline [albertsons di  bromm], and Other  Social History   Tobacco Use  . Smoking status: Never Smoker  . Smokeless tobacco: Never Used  Substance Use Topics  . Alcohol use: No  . Drug use: No    Family History  Problem Relation Age of Onset  . Heart Problems Mother   . Vascular Disease Mother   . Vascular Disease Maternal Grandmother   . Anesthesia problems Neg Hx     Review of Systems: A 12-system review of systems was performed and was negative except as noted in the HPI.  --------------------------------------------------------------------------------------------------  Physical Exam: BP 114/82 (BP  Location: Left Arm, Patient Position: Sitting, Cuff Size: Normal)   Pulse 69   Ht 5\' 5"  (1.651 m)   Wt 181 lb (82.1 kg)   LMP 05/20/2011   SpO2 98%   BMI 30.12 kg/m   General: NAD. Neck: No JVD or HJR.  Right external jugular vein becomes visible with Valsalva. Lungs: Clear to auscultation without wheezes or crackles. Heart: Regular rate and rhythm without murmurs, rubs, or gallops. Abdomen: Soft, nontender, nondistended. Extremities: No lower extremity edema.  2+ radial and pedal pulses bilaterally.  EKG: Normal sinus rhythm with short PR interval but no evidence of delta wave.  No significant change from prior tracing on 01/09/2019.  Lab Results  Component Value Date   WBC 6.8 07/13/2016   HGB 13.0 07/13/2016   HCT 39.0 07/13/2016   MCV 94 07/13/2016   PLT 258 07/13/2016    Lab Results  Component Value Date   NA 142 07/13/2016   K 4.9 07/13/2016   CL 102 07/13/2016   CO2 22 07/13/2016   BUN 5 (L) 07/13/2016   CREATININE 0.77 07/13/2016   GLUCOSE 92 07/13/2016   ALT 35 (H) 07/13/2016    No results found for: CHOL, HDL, LDLCALC, LDLDIRECT, TRIG, CHOLHDL  --------------------------------------------------------------------------------------------------  ASSESSMENT AND PLAN: Prominent right external jugular vein: This is a normal finding as it is only visible with increased intrathoracic pressure as would be seen with Valsalva.  I have reassured Ms. Dolloff that I have not seen any significant pathology on physical exam as well as with review of her prior echocardiogram as well as cervical spine MRI.  No further work-up or intervention is needed.  I will defer management of her neck and right arm pain to her other providers, as I do not think this is primarily cardiac in nature.  Palpitations: Increased in frequency, though palpitations have waxed and waned for many years.  Prior 30-day event monitor was unrevealing.  We will plan to continue current dose of  diltiazem.  Follow-up: Return to clinic in 6 months with [id number]  [name], MD 06/18/2019 11:41 AM

## 2019-06-18 NOTE — Patient Instructions (Signed)
Medication Instructions:  Your physician recommends that you continue on your current medications as directed. Please refer to the Current Medication list given to you today.  *If you need a refill on your cardiac medications before your next appointment, please call your pharmacy*   Follow-Up: At [facility], you and your health needs are our priority.  As part of our continuing mission to provide you with exceptional heart care, we have created designated Provider Care Teams.  These Care Teams include your primary Cardiologist (physician) and Advanced Practice Providers (APPs -  Physician Assistants and Nurse Practitioners) who all work together to provide you with the care you need, when you need it.  We recommend signing up for the patient portal called "MyChart".  Sign up information is provided on this After Visit Summary.  MyChart is used to connect with patients for Virtual Visits (Telemedicine).  Patients are able to view lab/test results, encounter notes, upcoming appointments, etc.  Non-urgent messages can be sent to your provider as well.   To learn more about what you can do with MyChart, go to [email or web address].    Your next appointment:   6 month(s) with an APP  The format for your next appointment:   In Person  Provider:    You may see one of the following Advanced Practice Providers on your designated Care Team:    Murray Hodgkins, NP  Christell Faith, PA-C  Marrianne Mood, PA-C

## 2019-06-19 ENCOUNTER — Encounter: Payer: Self-pay | Admitting: Internal Medicine

## 2019-09-23 ENCOUNTER — Other Ambulatory Visit: Payer: Self-pay

## 2019-09-23 MED ORDER — DILTIAZEM HCL ER COATED BEADS 120 MG PO CP24: ORAL_CAPSULE | ORAL | 0 refills | Status: DC

## 2019-11-19 ENCOUNTER — Encounter (INDEPENDENT_AMBULATORY_CARE_PROVIDER_SITE_OTHER): Payer: Medicare Other | Admitting: Ophthalmology

## 2019-11-25 NOTE — Progress Notes (Signed)
[facility] Note  11/28/2019     CHIEF COMPLAINT Patient presents for Retina Follow Up   HISTORY OF PRESENT ILLNESS: April [name] is a 49 y.o. female who presents to the clinic today for:   HPI    Retina Follow Up    Patient presents with  Other.  In both eyes.  This started 1 year ago.  I, the attending physician,  performed the HPI with the patient and updated documentation appropriately.          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 Patient states vision fine. No eye pain.       Last edited by Bernarda Caffey, MD on 11/28/2019 11:06 PM. (History)    pt states her glasses Rx has changes, she states she is still on Elmiron, she states last time she stopped it, she had to have sx, so she is worried about stopping it again  Referring physician: Jodi Marble, MD [place],  [place] [address]  HISTORICAL INFORMATION:   Selected notes from the [facility] - Referred by Dr. Brandt Loosen Tejan-Sie Pt has been on urology med, [place], for over 5 yrs.  Eval for potential retinal damage.   CURRENT MEDICATIONS: No current outpatient medications on file. (Ophthalmic Drugs)   No current facility-administered medications for this visit. (Ophthalmic Drugs)   Current Outpatient Medications (Other)  Medication Sig   albuterol (PROVENTIL HFA;VENTOLIN HFA) 108 (90 BASE) MCG/ACT inhaler Inhale 1 puff into the lungs as needed. For shortness of breath or wheezing   Ascorbic Acid (VITA-C PO) Take by mouth.   azelastine (ASTELIN) 0.1 % nasal spray as directed.   baclofen (LIORESAL) 20 MG tablet Take 20 mg by mouth 3 (three) times daily.   Biotin 10000 MCG TABS Take by mouth daily.   butalbital-acetaminophen-caffeine (FIORICET, ESGIC) 50-325-40 MG tablet TK 1 T PO Q 6 H PRN   Calcium-Phosphorus-Vitamin D (CALCIUM GUMMIES PO) Take 1 tablet by mouth every evening.    Cholecalciferol (VITAMIN D3) 5000 units TABS Take by mouth daily.   co-enzyme Q-10 30 MG capsule Take 30 mg by mouth daily.   Cyanocobalamin 5000 MCG SUBL Place 5,000 mcg under the tongue daily.    diazepam (VALIUM) 10 MG tablet Take 10 mg by mouth daily.   dicyclomine (BENTYL) 20 MG tablet Take 20 mg by mouth every 6 (six) hours.   diltiazem (CARDIZEM CD) 120 MG 24 hr capsule TAKE 1 CAPSULE(120 MG) BY MOUTH DAILY   ELMIRON 100 MG capsule Take 200 mg by mouth 2 (two) times daily.   Fexofenadine HCl (ALLEGRA PO) Take 1 tablet by mouth daily as needed. For allergies/sinues   fluconazole (DIFLUCAN) 150 MG tablet Take 150 mg by mouth daily as needed.    fluticasone (FLONASE) 50 MCG/ACT nasal spray SHAKE LQ AND U 1 TO 2 SPRAYS IEN QD UTD   Fluticasone-Salmeterol (ADVAIR) 250-50 MCG/DOSE AEPB Inhale 1 puff into the lungs 2 (two) times a day.    furosemide (LASIX) 20 MG tablet Take 20 mg by mouth daily.   guaiFENesin (MUCINEX) 600 MG 12 hr tablet Take 1,200 mg by mouth 2 (two) times daily.   hydrOXYzine (ATARAX/VISTARIL) 50 MG tablet Take 50 mg by mouth at bedtime.   hyoscyamine (LEVSIN SL) 0.125 MG SL tablet Place 0.125 mg under the tongue every 4 (four) hours as needed for cramping.   Lactobacillus (ACIDOPHILUS  PROBIOTIC PO) Take by mouth 3 (three) times daily.   lidocaine (XYLOCAINE) 5 % ointment Apply topically daily.   Lysine 500 MG CAPS Take by mouth daily.   montelukast (SINGULAIR) 10 MG tablet Take 10 mg by mouth at bedtime.   Multiple Vitamin (MULITIVITAMIN WITH MINERALS) TABS Take 1 tablet by mouth every evening.    nitrofurantoin (MACRODANTIN) 50 MG capsule Take 50 mg by mouth at bedtime.    oxybutynin (DITROPAN-XL) 5 MG 24 hr tablet Take 5 mg by mouth as needed.    PROCTOZONE-HC 2.5 % rectal cream Apply topically as needed.   progesterone (PROMETRIUM) 100 MG capsule Take 100 mg by mouth daily.    Riboflavin (B2) 100 MG TABS Take by mouth daily.   Simethicone 125  MG CAPS Take 125 mg by mouth 4 (four) times daily.   TURMERIC PO Take by mouth daily.   VALACYCLOVIR HCL PO Take 1,000 mg by mouth daily.    VITAMIN A PO Take 3,000 mg by mouth daily.   Zinc 25 MG TABS Take by mouth daily.   Current Facility-Administered Medications (Other)  Medication Route   betamethasone acetate-betamethasone sodium phosphate (CELESTONE) injection 3 mg Intramuscular      REVIEW OF SYSTEMS: ROS    Positive for: Neurological, Genitourinary, Musculoskeletal, HENT, Cardiovascular, Eyes, Respiratory, Psychiatric   Negative for: Constitutional, Skin, Endocrine, Allergic/Imm, Heme/Lymph   Last edited by Theodore Demark, COA on 11/28/2019  2:00 PM. (History)       ALLERGIES Allergies  Allergen Reactions   Amitriptyline Other (See Comments)    Other reaction(s): Other Change in mental status   Amoxicillin Rash   Bromelains     Other reaction(s): Abdominal pain   Celexa [Citalopram Hydrobromide] Other (See Comments)    Other reaction(s): Other Change in mental status   Ivp Dye  [Iodinated Diagnostic Agents] Anaphylaxis   Nortriptyline     Other reaction(s): Other   Penicillins Swelling and Rash   Pregabalin     Other reaction(s): Myalgias (muscle pain)   Bromelains [Pineapple Extract] Other (See Comments)    Severe pain   Budesonide-Formoterol Fumarate     Other reaction(s): Other   Citalopram Other (See Comments)   Mangifera Indica Swelling   Methylene Blue Other (See Comments)    Caused inflamed pancreas, per pt should not use   Sertraline Other (See Comments)    Other reaction(s): Other Suicidal thoughts   Sulfa Antibiotics Other (See Comments)   Sulfonamide Derivatives Other (See Comments)    excrutiating pain, patient states it caused her to have osteoarthritis   Bromaline Lorretta Harp Bromm] Rash   Other Rash    Mango    PAST MEDICAL HISTORY Past Medical History:  Diagnosis Date   Anxiety 12/14/2011   Asthma     no inhaler   Chest pain    a. 12/2015 CTA chest: no PE. No significant coronary Ca2+. Mosaic attenuation pattern in both lungs, may reflect small airway dzs; c. 12/2015 Ex MV: Walked 6 mins. No ischemia/scar.   Diastolic dysfunction    a. 10/2015 Echo: EF nl, mod diast dysfxn. Mild MR/TR; b. 09/2016 Echo: EF 55-60%, no rwma. Nl RV size and fxn.   Factor 5 Leiden mutation, heterozygous ([place])    Fibromyalgia    Fracture of 5th metatarsal 03/2011   left foot -     Heart palpitations    a. Notes intermittent tachycardia (sinus). Did not tolerate beta blockers 2/2 urinary incontinence.   Interstitial cystitis  Ovarian cyst    Pancreatitis    Pelvic floor dysfunction    Rheumatoid arteritis ([place])    Sinus infection    chronic   Syncope    a. 08/2017 Event monitor: Average heart rate 77 (49-136).  Periods of sinus arrhythmia noted.  Triggered events corresponded with sinus rhythm, sinus arrhythmia, and artifact.  No significant ectopy, sustained arrhythmias, or prolonged pauses observed.   Thrombosis    a. 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   Past Surgical History:  Procedure Laterality Date   ABDOMINAL HYSTERECTOMY     CHOLECYSTECTOMY     CYSTOSCOPY WITH HYDRODISTENSION AND BIOPSY  04/21/2011   Dr Philis Fendt   LAPAROSCOPIC ASSISTED VAGINAL HYSTERECTOMY  08/21/2011   Procedure: LAPAROSCOPIC ASSISTED VAGINAL HYSTERECTOMY;  Surgeon: Cyril Mourning, MD;  Location: [facility];  Service: Gynecology;  Laterality: N/A;  OPEN LAPAROSCOPIC   LAPAROSCOPIC NISSEN FUNDOPLICATION  3295   SALPINGOOPHORECTOMY  08/21/2011   Procedure: SALPINGO OOPHERECTOMY;  Surgeon: Cyril Mourning, MD;  Location: [facility];  Service: Gynecology;  Laterality: Bilateral;   TUBAL LIGATION  2010   WISDOM TOOTH EXTRACTION      FAMILY HISTORY Family History  Problem Relation Age of Onset   Heart Problems  Mother    Vascular Disease Mother    Vascular Disease Maternal Grandmother    Anesthesia problems Neg Hx     SOCIAL HISTORY Social History   Tobacco Use   Smoking status: Never Smoker   Smokeless tobacco: Never Used  Scientific laboratory technician Use: Never used  Substance Use Topics   Alcohol use: No   Drug use: No         OPHTHALMIC EXAM:  Base Eye Exam    Visual Acuity (Snellen - Linear)      Right Left   Dist cc 20/20 20/20 -1   Correction: Glasses       Tonometry (Tonopen, 1:57 PM)      Right Left   Pressure 11 13       Pupils      Dark Light Shape React APD   Right 4 3 Round Brisk None   Left 4 3 Round Brisk None       Visual Fields (Counting fingers)      Left Right    Full Full       Extraocular Movement      Right Left    Full, Ortho Full, Ortho       Neuro/Psych    Oriented x3: Yes   Mood/Affect: Normal       Dilation    Both eyes: 1.0% Mydriacyl, 2.5% Phenylephrine @ 1:57 PM        Slit Lamp and Fundus Exam    Slit Lamp Exam      Right Left   Lids/Lashes Dermatochalasis - upper lid, Meibomian gland dysfunction Dermatochalasis - upper lid, Meibomian gland dysfunction   Conjunctiva/Sclera White and quiet White and quiet   Cornea Trace Punctate epithelial erosions greatest inferiorly, no guttata Trace Punctate epithelial erosions greatest inferiorly, no guttata   Anterior Chamber Deep and quiet Deep and quiet   Iris Round and dilated Round and dilated   Lens 2+ Nuclear sclerosis, 1+ Cortical cataract 2+ Nuclear sclerosis, 1+ Cortical cataract   Vitreous Vitreous syneresis Vitreous syneresis       Fundus Exam      Right Left  Disc Pink and Sharp, mild tilt, Compact Pink and Sharp, inferior PPP, Compact   C/D Ratio 0.1 0.2   Macula Flat, good foveal reflex, mild RPE mottling, No heme or edema Flat, good foveal reflex, mild Retinal pigment epithelial mottling, No heme or edema   Vessels attenuated, mild tortuousity mild attenuation,  mild tortuousity   Periphery Attached, mild inferior pavingstone degeneration Attached, pigmented pavingstone degeneration inferiorly        Refraction    Wearing Rx      Sphere Cylinder Axis   Right -5.50 +0.50 169   Left -6.00 +0.75 025          IMAGING AND PROCEDURES  Imaging and Procedures for @TODAY @  OCT, Retina - OU - Both Eyes       Right Eye Quality was good. Central Foveal Thickness: 270. Progression has been stable. Findings include normal foveal contour, no IRF, no SRF.   Left Eye Quality was good. Central Foveal Thickness: 268. Progression has been stable. Findings include normal foveal contour, no IRF, no SRF, vitreomacular adhesion .   Notes *Images captured and stored on drive  Diagnosis / Impression:  NFP, no IRF/SRF OU  No pigmentary maculopathy or outer retinal atrophy -- no Elmiron toxicity  Clinical management:  See below  Abbreviations: NFP - Normal foveal profile. CME - cystoid macular edema. PED - pigment epithelial detachment. IRF - intraretinal fluid. SRF - subretinal fluid. EZ - ellipsoid zone. ERM - epiretinal membrane. ORA - outer retinal atrophy. ORT - outer retinal tubulation. SRHM - subretinal hyper-reflective material                 ASSESSMENT/PLAN:    ICD-10-CM   1. Retinal edema  H35.81 OCT, Retina - OU - Both Eyes  2. Myopia of both eyes with astigmatism  H52.13    H52.203   3. Combined forms of age-related cataract of both eyes  H25.813     1. No retinal edema on exam or OCT  - no pigmentary maculopathy or degeneration consistent with Elmiron toxicity  - pt reports taking po Elmiron, 200 mg BID, since 2012  - OCT and autofluorescence without RPE atrophy  - f/u 1-2 years, DFE, OCT, FAF  2. Myopia w/ astigmatism OU  3. 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  - discussed diagnosis and progression  - not yet visually  significant  - monitor for now   Ophthalmic Meds Ordered this visit:  No orders of the defined types were placed in this encounter.      Return in about 1 year (around 11/27/2020) for f/u pigmentary maculopathy OU, DFE, OCT.  There are no Patient Instructions on file for this visit.   Explained the diagnoses, plan, and follow up with the patient and they expressed understanding.  Patient expressed understanding of the importance of proper follow up care.   This document serves as a record of services personally performed by Gardiner Sleeper, MD, PhD. It was created on their behalf by San Jetty. Owens Shark, OA an ophthalmic technician. The creation of this record is the provider's dictation and/or activities during the visit.    Electronically signed by: San Jetty. Owens Shark, [place] 10.26.2021 11:12 PM   Gardiner Sleeper, M.D., Ph.D. Diseases & Surgery of the Retina and Vitreous [facility]  I have reviewed the above documentation for accuracy and completeness, and I agree  with the above. Gardiner Sleeper, M.D., Ph.D. 11/28/19 11:12 PM   Abbreviations: M myopia (nearsighted); A astigmatism; H hyperopia (farsighted); P presbyopia; Mrx spectacle prescription;  CTL contact lenses; OD right eye; OS left eye; OU both eyes  XT exotropia; ET esotropia; PEK punctate epithelial keratitis; PEE punctate epithelial erosions; DES dry eye syndrome; MGD meibomian gland dysfunction; ATs artificial tears; PFAT's preservative free artificial tears; [place] nuclear sclerotic cataract; PSC posterior subcapsular cataract; ERM epi-retinal membrane; PVD posterior vitreous detachment; RD retinal detachment; DM diabetes mellitus; DR diabetic retinopathy; NPDR non-proliferative diabetic retinopathy; PDR proliferative diabetic retinopathy; CSME clinically significant macular edema; DME diabetic macular edema; dbh dot blot hemorrhages; CWS cotton wool spot; POAG primary open angle glaucoma; C/D cup-to-disc ratio;  HVF humphrey visual field; GVF goldmann visual field; OCT optical coherence tomography; IOP intraocular pressure; BRVO Branch retinal vein occlusion; CRVO central retinal vein occlusion; CRAO central retinal artery occlusion; BRAO branch retinal artery occlusion; RT retinal tear; SB scleral buckle; PPV pars plana vitrectomy; VH Vitreous hemorrhage; PRP panretinal laser photocoagulation; IVK intravitreal kenalog; VMT vitreomacular traction; MH Macular hole;  NVD neovascularization of the disc; NVE neovascularization elsewhere; AREDS age related eye disease study; ARMD age related macular degeneration; POAG primary open angle glaucoma; EBMD epithelial/anterior basement membrane dystrophy; ACIOL anterior chamber intraocular lens; IOL intraocular lens; PCIOL posterior chamber intraocular lens; Phaco/IOL phacoemulsification with intraocular lens placement; [place] photorefractive keratectomy; LASIK laser assisted in situ keratomileusis; HTN hypertension; DM diabetes mellitus; COPD chronic obstructive pulmonary disease

## 2019-11-28 ENCOUNTER — Ambulatory Visit (INDEPENDENT_AMBULATORY_CARE_PROVIDER_SITE_OTHER): Payer: Medicare Other | Admitting: Ophthalmology

## 2019-11-28 ENCOUNTER — Other Ambulatory Visit: Payer: Self-pay

## 2019-11-28 ENCOUNTER — Encounter (INDEPENDENT_AMBULATORY_CARE_PROVIDER_SITE_OTHER): Payer: Self-pay | Admitting: Ophthalmology

## 2019-11-28 DIAGNOSIS — H25813 Combined forms of age-related cataract, bilateral: Secondary | ICD-10-CM | POA: Diagnosis not present

## 2019-11-28 DIAGNOSIS — H52203 Unspecified astigmatism, bilateral: Secondary | ICD-10-CM | POA: Diagnosis not present

## 2019-11-28 DIAGNOSIS — H3581 Retinal edema: Secondary | ICD-10-CM

## 2019-11-28 DIAGNOSIS — H5213 Myopia, bilateral: Secondary | ICD-10-CM | POA: Diagnosis not present

## 2019-12-09 ENCOUNTER — Ambulatory Visit (INDEPENDENT_AMBULATORY_CARE_PROVIDER_SITE_OTHER): Payer: Medicare Other | Admitting: Physician Assistant

## 2019-12-09 ENCOUNTER — Encounter: Payer: Self-pay | Admitting: Physician Assistant

## 2019-12-09 ENCOUNTER — Other Ambulatory Visit: Payer: Self-pay

## 2019-12-09 VITALS — BP 114/84 | HR 72 | Ht 65.0 in | Wt 171.0 lb

## 2019-12-09 DIAGNOSIS — M542 Cervicalgia: Secondary | ICD-10-CM

## 2019-12-09 DIAGNOSIS — Z87898 Personal history of other specified conditions: Secondary | ICD-10-CM

## 2019-12-09 DIAGNOSIS — R0789 Other chest pain: Secondary | ICD-10-CM

## 2019-12-09 DIAGNOSIS — M797 Fibromyalgia: Secondary | ICD-10-CM

## 2019-12-09 DIAGNOSIS — R600 Localized edema: Secondary | ICD-10-CM

## 2019-12-09 DIAGNOSIS — R002 Palpitations: Secondary | ICD-10-CM | POA: Diagnosis not present

## 2019-12-09 DIAGNOSIS — R072 Precordial pain: Secondary | ICD-10-CM

## 2019-12-09 MED ORDER — METOPROLOL TARTRATE 100 MG PO TABS: ORAL_TABLET | ORAL | 0 refills | Status: DC

## 2019-12-09 MED ORDER — DILTIAZEM HCL 30 MG PO TABS: 30.0000 mg | ORAL_TABLET | Freq: Three times a day (TID) | ORAL | 1 refills | Status: DC | PRN

## 2019-12-09 NOTE — Progress Notes (Signed)
.    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 Bynum Bellows at [facility]) previously on Xarelto discontinued for severe anemia, fibromyalgia, asthma, RA, h/o chest pain, and seen today for follow-up of tachypalpitations.   Past Medical History    Past Medical History:  Diagnosis Date  . Anxiety 12/14/2011  . Asthma    no inhaler  . Chest pain    a. 12/2015 CTA chest: no PE. No significant coronary Ca2+. Mosaic attenuation pattern in both lungs, may reflect small airway dzs; c. 12/2015 Ex MV: Walked 6 mins. No ischemia/scar.  . Diastolic dysfunction    a. 10/2015 Echo: EF nl, mod diast dysfxn. Mild MR/TR; b. 09/2016 Echo: EF 55-60%, no rwma. Nl RV size and fxn.  . Factor 5 Leiden mutation, heterozygous ([place])   . Fibromyalgia   . Fracture of 5th metatarsal 03/2011   left foot -    . Heart palpitations    a. Notes intermittent tachycardia (sinus). Did not tolerate beta blockers 2/2 urinary incontinence.  . Interstitial cystitis   . Ovarian cyst   . Pancreatitis   . Pelvic floor dysfunction   . Rheumatoid arteritis ([place])   . Sinus infection    chronic  . Syncope    a. 08/2017 Event monitor: Average heart rate 77 (49-136).  Periods of sinus arrhythmia noted.  Triggered events corresponded with sinus rhythm, sinus arrhythmia, and artifact.  No significant ectopy, sustained arrhythmias, or prolonged pauses observed.  . Thrombosis    a. 12/14/2011 Thrombosis of R gonadal vein with extension of thrombus into IVC to level of R renal vein per CT 12/11/12 of [place].   Past Surgical History:  Procedure Laterality Date  . ABDOMINAL HYSTERECTOMY    . CHOLECYSTECTOMY    . CYSTOSCOPY WITH HYDRODISTENSION AND BIOPSY   04/21/2011   Dr Philis Fendt  . LAPAROSCOPIC ASSISTED VAGINAL HYSTERECTOMY  08/21/2011   Procedure: LAPAROSCOPIC ASSISTED VAGINAL HYSTERECTOMY;  Surgeon: Cyril Mourning, MD;  Location: [facility];  Service: Gynecology;  Laterality: N/A;  OPEN LAPAROSCOPIC  . LAPAROSCOPIC NISSEN FUNDOPLICATION  9030  . SALPINGOOPHORECTOMY  08/21/2011   Procedure: SALPINGO OOPHERECTOMY;  Surgeon: Cyril Mourning, MD;  Location: [facility];  Service: Gynecology;  Laterality: Bilateral;  . TUBAL LIGATION  2010  . WISDOM TOOTH EXTRACTION      Allergies  Allergies  Allergen Reactions  . Amitriptyline Other (See Comments)    Other reaction(s): Other Change in mental status  . Amoxicillin Rash  . Bromelains     Other reaction(s): Abdominal pain  . Celexa [Citalopram Hydrobromide] Other (See Comments)    Other reaction(s): Other Change in mental status  . Ivp Dye  [Iodinated Diagnostic Agents] Anaphylaxis  . Nortriptyline     Other reaction(s): Other  . Penicillins Swelling and Rash  . Pregabalin     Other reaction(s): Myalgias (muscle pain)  . Bromelains [Pineapple Extract] Other (See Comments)    Severe pain  . Budesonide-Formoterol Fumarate     Other reaction(s): Other  . Citalopram Other (See Comments)  . Mangifera Indica Swelling  . Methylene Blue Other (See Comments)    Caused inflamed pancreas, per pt should not use  . Sertraline Other (See  Comments)    Other reaction(s): Other Suicidal thoughts  . Sulfa Antibiotics Other (See Comments)  . Sulfonamide Derivatives Other (See Comments)    excrutiating pain, patient states it caused her to have osteoarthritis  . Bromaline [Albertsons Di Bromm] Rash  . 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  2017 TTE showed normal LVSF with mild MR/TR.  Stress test 2017 without ischemia. 2018 TTE EF 55-60%.  2019 monitor showed predominantly NSR.   In 2019, she presented with continued c/o intermittent  palpitations. No further testing or medication changes were recommended at that time.  In the past, she has felt her palpitations were connected to her bladder spasms and significantly improved by her bladder stimulator. She reported walking daily on her parent's farm without any limitations.   In September 2020, she started living with her Mom to avoid exposure to COVID-19. During that time, her husband tested COVID-19 positive and remained separate from her at their home in [place]. She reported eating a high salt diet over the next few months d/t her mother's cooking. She subsequently noticed SOB/DOE that was not alleviated with her usual inhalers. She also noted severe orthopnea, reportedly having to sleep sitting up in a recliner. She felt increasingly fatigued with active shingles outbreak. She also noted a distended right jugular vein that became worse with talking or laughing. She continued to take lasix 20mg  daily without any noted LEE and diltiazem without racing HR or palpitations.  She noted weight gain, attributed that to her mother's cooking / caloric intake.  She underwent duplex ultrasound per PCP and performed of the RUE without significant findings.   Echo 01/2019 showed EF 55 to 60%, borderline LVH, NR WMA.  She was seen by her primary cardiologist, Dr. Saunders Revel, 06/18/2019 and had multiple complaints.  She had recently had a tooth extracted.  She continued to notice the prominence of her right neck vein.  She experience intermittent pain attributed to muscle, bone spur, and shingles.  She reported ongoing tachypalpitations, lasting less than 10 minutes.  She felt as if she was not urinating as much with intermittent edema  noted in her legs.  No medication changes or further work-up were recommended.  Today, 12/09/2019, she returns to clinic and notes ongoing severe pain associated with her right neck muscle (being evaluated at [facility]).  She reports that she is receiving nerve ablations via [facility], and which result in numbness in her scalp; however, she will take this numbness in order to get some relief from her pain.  She continues to note pain from bone spurs.  She reports chest discomfort, though she states with her multiple comorbid issues MSK problems, she is unable to tease out if it is actual CP and reports significant anxiety regarding this.  She also reports tachypalpitations and elevated BP.  She states her BP was high at PCP most recently 1 week from yesterday.  She reports BP 154/94, then BP high [place] at 154/90, as well as BP high into the late day at that time.  She states she was in significant pain, which could have influenced her readings.  She notes some concern over monitoring her heart rate and noting  HR over 200bpm for over 4 hours, especially when walking.  Her BP was also noted to be high at that time.  She sat down to drink some water and checked her BP/heart rate again with subsequent elevation.  She denies any syncope, loss of consciousness, PND, or early satiety.  She notes intermittent edema and that she is sleeping in a recliner for relief of pain.  She reports dyspnea and poor exercise tolerance.  She denies any signs or symptoms of bleeding.  She brings with her recent labs from her PCP.  These will be copied and scanned into her record.  She reports medication compliance.  She notes significant anxiety concerning teasing out the etiology of her chest pain, elevated BP, and tachypalpitations with recommendations as below.  Home Medications    Prior to Admission medications   Medication Sig Start Date End Date Taking? Authorizing Provider  albuterol (PROVENTIL HFA;VENTOLIN HFA) 108  (90 BASE) MCG/ACT inhaler Inhale 1 puff into the lungs as needed. For shortness of breath or wheezing    [provider]  azelastine (ASTELIN) 0.1 % nasal spray as directed. 09/13/18   [provider]  AZELASTINE HCL NA Place into the nose daily.    [provider]  baclofen (LIORESAL) 20 MG tablet Take 20 mg by mouth 3 (three) times daily.    [provider]  Biotin 10000 MCG TABS Take by mouth daily.    [provider]  butalbital-acetaminophen-caffeine (FIORICET, ESGIC) 50-325-40 MG tablet TK 1 T PO Q 6 H PRN 05/31/16   [provider]  Calcium-Phosphorus-Vitamin D (CALCIUM GUMMIES PO) Take 1 tablet by mouth every evening.    [provider]  Cholecalciferol (VITAMIN D3) 5000 units TABS Take by mouth daily.    [provider]  co-enzyme Q-10 30 MG capsule Take 30 mg by mouth daily.    [provider]  Cyanocobalamin 5000 MCG SUBL Place 5,000 mcg under the tongue daily.     [provider]  diazepam (VALIUM) 10 MG tablet Take 10 mg by mouth daily.    [provider]  diltiazem (CARDIZEM CD) 120 MG 24 hr capsule TAKE 1 CAPSULE(120 MG) BY MOUTH DAILY Patient not taking: Reported on 11/19/2018 09/06/18   Theora Gianotti, NP  ELMIRON 100 MG capsule Take 200 mg by mouth 2 (two) times daily. 09/26/16   [provider]  Fexofenadine HCl (ALLEGRA PO) Take 1 tablet by mouth daily as needed. For allergies/sinues    [provider]  fluconazole (DIFLUCAN) 150 MG tablet Take 150 mg by mouth daily as needed.  08/14/16   [provider]  fluticasone (FLONASE) 50 MCG/ACT nasal spray SHAKE LQ AND U 1 TO 2 SPRAYS IEN QD UTD 06/03/16   [provider]  Fluticasone-Salmeterol (ADVAIR) 250-50 MCG/DOSE AEPB Inhale 1 puff into the lungs 2 (two) times a day.     [provider]  furosemide (LASIX) 20 MG tablet Take 20 mg by mouth daily.    [provider]  gabapentin  (NEURONTIN) 600 MG tablet Take 600 mg by mouth 3 (three) times daily.  09/26/16   [provider]  guaifenesin (HUMIBID E) 400 MG TABS Take 1,200 mg by mouth 2 (two) times daily as needed. For congestion    [provider]  guaiFENesin (MUCINEX) 600 MG 12 hr tablet Take 1,200 mg by mouth 2 (two) times daily.    [provider]  hydrOXYzine (ATARAX/VISTARIL) 50 MG tablet Take 50 mg  by mouth at bedtime.    [provider]  hyoscyamine (LEVSIN SL) 0.125 MG SL tablet Place 0.125 mg under the tongue every 4 (four) hours as needed for cramping.    [provider]  Lactobacillus (ACIDOPHILUS PROBIOTIC PO) Take by mouth 3 (three) times daily.    [provider]  lidocaine (XYLOCAINE) 5 % ointment Apply topically daily. 09/26/16   [provider]  Lysine 500 MG CAPS Take by mouth daily.    [provider]  montelukast (SINGULAIR) 10 MG tablet Take 10 mg by mouth at bedtime.    [provider]  Multiple Vitamin (MULITIVITAMIN WITH MINERALS) TABS Take 1 tablet by mouth every evening.     [provider]  nitrofurantoin (MACRODANTIN) 50 MG capsule Take 50 mg by mouth daily as needed (UTI).    [provider]  oxybutynin (DITROPAN-XL) 5 MG 24 hr tablet Take 5 mg by mouth as needed.     [provider]  PROCTOZONE-HC 2.5 % rectal cream Apply topically as needed. 09/26/16   [provider]  progesterone (PROMETRIUM) 100 MG capsule Take 100 mg by mouth daily.  08/03/16   [provider]  Riboflavin (B2) 100 MG TABS Take by mouth daily.    [provider]  Simethicone 125 MG CAPS Take 125 mg by mouth 4 (four) times daily.    [provider]  TURMERIC PO Take by mouth daily.    [provider]  valACYclovir (VALTREX) 500 MG tablet TK 1 T PO QD 06/07/16   [provider]  VITAMIN A PO Take 3,000 mg by mouth daily.    [provider]  vitamin E (VITAMIN E)  400 UNIT capsule Take 800 Units by mouth daily.    [provider]  Zinc 25 MG TABS Take by mouth daily.    [provider]    Review of Systems    She denies pnd, n, v, syncope, or early satiety. She reports DOE, chest pain of unclear etiology, tachypalpitations, elevated BP, dizziness, dyspnea, anxiety, musculoskeletal pain with right sided neck pain that pulls on her scalp and chest, intermittent edema, bladder spasms. All other systems reviewed and are otherwise negative except as noted above.   Physical Exam    VS:  BP 114/84 (BP Location: Left Arm, Patient Position: Sitting, Cuff Size: Normal)   Pulse 72   Ht 5\' 5"  (1.651 m)   Wt 171 lb (77.6 kg)   LMP 05/20/2011   SpO2 98%   BMI 28.46 kg/m  , BMI Body mass index is 28.46 kg/m. GEN: Female, in no acute distress.  Joined by her daughter. HEENT: normal. Neck: Supple,No carotid bruits  or masses. Cardiac: RRR, no murmurs, rubs, or gallops.  Right external jugular vein visible with Valsalva.  TTP.  No clubbing, cyanosis, edema.  Radials/DP/PT +1 and equal bilaterally.  Respiratory:  Respirations regular and unlabored, clear to auscultation bilaterally. GI: Soft, nontender, nondistended, BS + x 4. MS: no deformity or atrophy. Skin: warm and dry, no rash. Neuro:  Strength and sensation are intact. Psych: Normal affect.  Accessory Clinical Findings    ECG -deferred.  The following studies were reviewed today:   30-day event monitor (09/20/2017): Predominantly sinus rhythm without significant arrhythmia.  TTE (10/16/16): Normal LV size and function. LVEF 55-60% with normal wall motion and diastolic parameters. Normal RV size and function.  Exercise myocardial perfusion stress test (12/16/15): Normal study. No evidence of ischemia or scar. Patient exercised  6 minutes achieving 7 METs with peak heart rate of 151 bpm (85% MPHR).  CTA chest (12/06/15): No pulmonary embolism. No significant coronary artery  calcification. Mosaic attenuation pattern in both lungs that may reflect small airway disease.  Transthoracic echocardiogram (10/06/15, [place]): Normal LV function with moderate diastolic dysfunction. Mild mitral and tricuspid regurgitation.  EF 55 to 60%, borderline LVH, NR WMA.  Assessment & Plan    Palpitations, Racing HR --Reports breakthrough tachypalpitations.  Reviewed her most recent labs, performed per her PCP.  We will check a BMET and magnesium to ensure electrolytes at goal.  Recent TSH WNL.  Previous cardiac monitoring as above and unrevealing.  Continue diltiazem 120mg  daily.  Will also provide with as needed short acting diltiazem 30 mg every 8 hours for breakthrough tachypalpitations.  Diet and lifestyle discussed - increase activity as tolerated.  Atypical CP --Reports atypical chest pain, described as chest pain that occurs across her anterior chest and upper abdomen.  She is worried that this CP is 2/2 cardiac etiology with reassurance that previous work-up has been unrevealing.  Given her continued anxiety surrounding her atypical chest pain, discussed further future work-up with cardiac CT.  After further discussion, we will schedule her for cardiac CT.  LEE --Reports intermittent edema as in prior visits. Continue lasix.   Prominent right external jugular vein  --As noted at her previous visit, this is a normal finding and visible with increased intrathoracic pressure as would be seen with Valsalva.  No significant pathology on physical exam and review of prior echo, as well as cervical spine MRI.  No further work-up or intervention required at this time.  History of syncope/near syncope --No report of recent syncope or near syncope with previous event monitor showing NSR and without arrhythmia. No further workup at this time.  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 Duplex study performed of RUE and without significant findings, per patient.  Right-sided neck pain --Further work-up and management per [facility].   Medication changes: Addition of short acting Cardizem 30 mg to be taken as needed for breakthrough tachypalpitations. Labs ordered: BMET, magnesium. Studies / Imaging ordered: Cardiac CT Future considerations: None Disposition: RTC after cardiac CT    Arvil Chaco, PA-C 12/09/2019, 2:54 PM Pager [phone]

## 2019-12-09 NOTE — Patient Instructions (Signed)
Medication Instructions:   START Cardizem 30mg  one tab AS NEEDED for breakthrough palpitations.  *If you need a refill on your cardiac medications before your next appointment, please call your pharmacy*   Lab Work:  Your physician recommends that you have labwork today:  Bmet, Magnesium  If you have labs (blood work) drawn today and your tests are completely normal, you will receive your results only by: [name] MyChart Message (if you have MyChart) OR . A paper copy in the mail If you have any lab test that is abnormal or we need to change your treatment, we will call you to review the results.   Testing/Procedures: Your physician has requested that you have cardiac CT. Cardiac computed tomography (CT) is a painless test that uses an x-ray machine to take clear, detailed pictures of your heart.  Your cardiac CT will be scheduled at one of the below locations:   [facility] [address] [place], [place] [address] [phone]  [facility] [address] [place], [place] [address] [phone]  If scheduled at [facility], please arrive at the [facility] main entrance of [facility] 30 minutes prior to test start time. Proceed to the [facility] Radiology Department (first floor) to check-in and test prep.  If scheduled at [facility], please arrive 15 mins early for check-in and test prep.  Please follow these instructions carefully (unless otherwise directed):   On the Night Before the Test: . Be sure to Drink plenty of water. . Do not consume any caffeinated/decaffeinated beverages or chocolate 12 hours prior to your test. . Do not take any antihistamines 12 hours prior to your test.    On the Day of the Test: . Drink plenty of water. Do not drink any water within one hour of the test. . Do not eat any food 4 hours prior to the test. . You may take your regular  medications prior to the test.  . Take metoprolol (Lopressor) 100mg  two hours prior to test. . HOLD Furosemide morning of the test. . FEMALES- please wear underwire-free bra if available        After the Test: . Drink plenty of water. . After receiving IV contrast, you may experience a mild flushed feeling. This is normal. . On occasion, you may experience a mild rash up to 24 hours after the test. This is not dangerous. If this occurs, you can take Benadryl 25 mg and increase your fluid intake. . If you experience trouble breathing, this can be serious. If it is severe call 911 IMMEDIATELY. If it is mild, please call our office.    Once we have confirmed authorization from your insurance company, we will call you to set up a date and time for your test. Based on how quickly your insurance processes prior authorizations requests, please allow up to 4 weeks to be contacted for scheduling your Cardiac CT appointment. Be advised that routine Cardiac CT appointments could be scheduled as many as 8 weeks after your provider has ordered it.  For non-scheduling related questions, please contact the cardiac imaging nurse navigator should you have any questions/concerns: Marchia Bond, Cardiac Imaging Nurse Navigator Burley Saver, Interim Cardiac Imaging Nurse [place] [facility] Direct Office Dial: [phone]   For scheduling needs, including cancellations and rescheduling, please call Vivien Rota at [phone], option 3.       Follow-Up: At [facility], you  and your health needs are our priority.  As part of our continuing mission to provide you with exceptional heart care, we have created designated Provider Care Teams.  These Care Teams include your primary Cardiologist (physician) and Advanced Practice Providers (APPs -  Physician Assistants and Nurse Practitioners) who all work together to provide you with the care you need, when you need it.  We recommend signing  up for the patient portal called "MyChart".  Sign up information is provided on this After Visit Summary.  MyChart is used to connect with patients for Virtual Visits (Telemedicine).  Patients are able to view lab/test results, encounter notes, upcoming appointments, etc.  Non-urgent messages can be sent to your provider as well.   To learn more about what you can do with MyChart, go to [email or web address].    Your next appointment:   1 month(s)  The format for your next appointment:   In Person  Provider:   You may see Yvonne Kendall, MD or one of the following Advanced Practice Providers on your designated Care Team:    Nicolasa Ducking, NP  Eula Listen, PA-C  Marisue Ivan, PA-C  Cadence Fransico Michael, [place]    Cardiac CT Angiogram A cardiac CT angiogram is a procedure to look at the heart and the area around the heart. It may be done to help find the cause of chest pains or other symptoms of heart disease. During this procedure, a substance called contrast dye is injected into the blood vessels in the area to be checked. A large X-ray machine, called a CT scanner, then takes detailed pictures of the heart and the surrounding area. The procedure is also sometimes called a coronary CT angiogram, coronary artery scanning, or CTA. A cardiac CT angiogram allows the health care provider to see how well blood is flowing to and from the heart. The health care provider will be able to see if there are any problems, such as:  Blockage or narrowing of the coronary arteries in the heart.  Fluid around the heart.  Signs of weakness or disease in the muscles, valves, and tissues of the heart. Tell a health care provider about:  Any allergies you have. This is especially important if you have had a previous allergic reaction to contrast dye.  All medicines you are taking, including vitamins, herbs, eye drops, creams, and over-the-counter medicines.  Any blood disorders you have.  Any  surgeries you have had.  Any medical conditions you have.  Whether you are pregnant or may be pregnant.  Any anxiety disorders, chronic pain, or other conditions you have that may increase your stress or prevent you from lying still. What are the risks? Generally, this is a safe procedure. However, problems may occur, including:  Bleeding.  Infection.  Allergic reactions to medicines or dyes.  Damage to other structures or organs.  Kidney damage from the contrast dye that is used.  Increased risk of cancer from radiation exposure. This risk is low. Talk with your health care provider about: ? The risks and benefits of testing. ? How you can receive the lowest dose of radiation. What happens before the procedure?  Wear comfortable clothing and remove any jewelry, glasses, dentures, and hearing aids.  Follow instructions from your health care provider about eating and drinking. This may include: ? For 12 hours before the procedure -- avoid caffeine. This includes tea, coffee, soda, energy drinks, and diet pills. Drink plenty of water or other fluids that do not have caffeine  in them. Being well hydrated can prevent complications. ? For 4-6 hours before the procedure -- stop eating and drinking. The contrast dye can cause nausea, but this is less likely if your stomach is empty.  Ask your health care provider about changing or stopping your regular medicines. This is especially important if you are taking diabetes medicines, blood thinners, or medicines to treat problems with erections (erectile dysfunction). What happens during the procedure?   Hair on your chest may need to be removed so that small sticky patches called electrodes can be placed on your chest. These will transmit information that helps to monitor your heart during the procedure.  An IV will be inserted into one of your veins.  You might be given a medicine to control your heart rate during the procedure. This  will help to ensure that good images are obtained.  You will be asked to lie on an exam table. This table will slide in and out of the CT machine during the procedure.  Contrast dye will be injected into the IV. You might feel warm, or you may get a metallic taste in your mouth.  You will be given a medicine called nitroglycerin. This will relax or dilate the arteries in your heart.  The table that you are lying on will move into the CT machine tunnel for the scan.  The person running the machine will give you instructions while the scans are being done. You may be asked to: ? Keep your arms above your head. ? Hold your breath. ? Stay very still, even if the table is moving.  When the scanning is complete, you will be moved out of the machine.  The IV will be removed. The procedure may vary among health care providers and hospitals. What can I expect after the procedure? After your procedure, it is common to have:  A metallic taste in your mouth from the contrast dye.  A feeling of warmth.  A headache from the nitroglycerin. Follow these instructions at home:  Take over-the-counter and prescription medicines only as told by your health care provider.  If you are told, drink enough fluid to keep your urine pale yellow. This will help to flush the contrast dye out of your body.  Most people can return to their normal activities right after the procedure. Ask your health care provider what activities are safe for you.  It is up to you to get the results of your procedure. Ask your health care provider, or the department that is doing the procedure, when your results will be ready.  Keep all follow-up visits as told by your health care provider. This is important. Contact a health care provider if:  You have any symptoms of allergy to the contrast dye. These include: ? Shortness of breath. ? Rash or hives. ? A racing heartbeat. Summary  A cardiac CT angiogram is a procedure  to look at the heart and the area around the heart. It may be done to help find the cause of chest pains or other symptoms of heart disease.  During this procedure, a large X-ray machine, called a CT scanner, takes detailed pictures of the heart and the surrounding area after a contrast dye has been injected into blood vessels in the area.  Ask your health care provider about changing or stopping your regular medicines before the procedure. This is especially important if you are taking diabetes medicines, blood thinners, or medicines to treat erectile dysfunction.  If  you are told, drink enough fluid to keep your urine pale yellow. This will help to flush the contrast dye out of your body. This information is not intended to replace advice given to you by your health care provider. Make sure you discuss any questions you have with your health care provider. Document Revised: 09/11/2018 Document Reviewed: 09/11/2018 Elsevier Patient Education  [place].

## 2019-12-10 LAB — BASIC METABOLIC PANEL
BUN/Creatinine Ratio: 6 — ABNORMAL LOW (ref 9–23)
BUN: 5 mg/dL — ABNORMAL LOW (ref 6–24)
CO2: 24 mmol/L (ref 20–29)
Calcium: 9.5 mg/dL (ref 8.7–10.2)
Chloride: 100 mmol/L (ref 96–106)
Creatinine, Ser: 0.82 mg/dL (ref 0.57–1.00)
GFR calc Af Amer: 98 mL/min/{1.73_m2} (ref >59)
GFR calc non Af Amer: 85 mL/min/{1.73_m2} (ref >59)
Glucose: 102 mg/dL — ABNORMAL HIGH (ref 65–99)
Potassium: 4.9 mmol/L (ref 3.5–5.2)
Sodium: 139 mmol/L (ref 134–144)

## 2019-12-10 LAB — MAGNESIUM: Magnesium: 1.9 mg/dL (ref 1.6–2.3)

## 2019-12-15 ENCOUNTER — Telehealth: Payer: Self-pay | Admitting: *Deleted

## 2019-12-15 DIAGNOSIS — Z91041 Radiographic dye allergy status: Secondary | ICD-10-CM

## 2019-12-15 NOTE — Telephone Encounter (Addendum)
I spoke with pt and she reports that she does NOT have contrast allergy and has been asking to have this removed from her allergy list without success. Pt states there was a possible reaction in the past that was GI related and Contrast was added to her list. Pt confirms that since then she HAS received IV Contrast without pre-medication without any allergic reaction. I will update her notes on the allergy list as to what pt has stated.

## 2019-12-15 NOTE — Telephone Encounter (Signed)
I will defer this to the CT team (Dr. Garen Lah and Marchia Bond).  In either case, she will need to be premedicated with prednisone and Benadryl, per protocol.  Thanks.  [name], MD [facility]

## 2019-12-15 NOTE — Telephone Encounter (Signed)
-----   Message from Melony Overly sent at 12/15/2019 10:40 AM EST ----- Regarding: ct heart  Scheduled 11/18 @ 1:45   Pt has a contrast allergy   Thanks, [place]

## 2019-12-15 NOTE — Telephone Encounter (Signed)
Pt is scheduled for Cardiac CT 11/18 @ 1:45 [facility])  Pt has a contrast allergy with anaphylactic reaction. Are we able to complete this at [facility] with pre-med or does this need to be rescheduled at [facility]?

## 2019-12-16 ENCOUNTER — Other Ambulatory Visit (HOSPITAL_COMMUNITY): Payer: Self-pay | Admitting: Emergency Medicine

## 2019-12-16 ENCOUNTER — Telehealth (HOSPITAL_COMMUNITY): Payer: Self-pay | Admitting: *Deleted

## 2019-12-16 MED ORDER — DIPHENHYDRAMINE HCL 50 MG PO TABS: ORAL_TABLET | ORAL | 0 refills | Status: DC

## 2019-12-16 MED ORDER — PREDNISONE 50 MG PO TABS: ORAL_TABLET | ORAL | 0 refills | Status: DC

## 2019-12-16 NOTE — Telephone Encounter (Signed)
Reaching out to patient to offer assistance regarding upcoming cardiac imaging study; pt verbalizes understanding of appt date/time, parking situation and where to check in, pre-test NPO status and medications ordered, and verified current allergies; name and call back number provided for further questions should they arise  Merle Tai RN Navigator Cardiac Imaging [facility] [phone] office [phone] cell  Pt verbalized understanding of how to take 13hr prep and metoprolol. 

## 2019-12-16 NOTE — Telephone Encounter (Signed)
Contrast allergy pre-medications ordered for scan on 11/18  Marchia Bond RN Navigator Cardiac Imaging [facility] Services [phone] Office  [phone] Cell

## 2019-12-18 ENCOUNTER — Encounter: Payer: Self-pay | Admitting: *Deleted

## 2019-12-18 ENCOUNTER — Other Ambulatory Visit: Payer: Self-pay

## 2019-12-18 ENCOUNTER — Ambulatory Visit (HOSPITAL_COMMUNITY)
Admission: RE | Admit: 2019-12-18 | Discharge: 2019-12-18 | Disposition: A | Discharge status: Home / Self Care | Payer: Medicare Other | Source: Ambulatory Visit | Attending: Physician Assistant | Admitting: Physician Assistant

## 2019-12-18 DIAGNOSIS — Z006 Encounter for examination for normal comparison and control in clinical research program: Secondary | ICD-10-CM

## 2019-12-18 DIAGNOSIS — R072 Precordial pain: Secondary | ICD-10-CM | POA: Insufficient documentation

## 2019-12-18 DIAGNOSIS — K449 Diaphragmatic hernia without obstruction or gangrene: Secondary | ICD-10-CM

## 2019-12-18 DIAGNOSIS — R0789 Other chest pain: Secondary | ICD-10-CM

## 2019-12-18 MED ORDER — NITROGLYCERIN 0.4 MG SL SUBL
SUBLINGUAL_TABLET | SUBLINGUAL | Status: AC
  Administered 2019-12-18: 0.8 mg via SUBLINGUAL
  Filled 2019-12-18: qty 2

## 2019-12-18 MED ORDER — NITROGLYCERIN 0.4 MG SL SUBL: 0.8000 mg | SUBLINGUAL_TABLET | Freq: Once | SUBLINGUAL | Status: AC

## 2019-12-18 MED ORDER — IOHEXOL 300 MG/ML  SOLN
80.0000 mL | Freq: Once | INTRAMUSCULAR | Status: AC | PRN
  Administered 2019-12-18: 80 mL via INTRAVENOUS

## 2019-12-18 NOTE — Research (Signed)
IDENTIFY Informed Consent                  Subject Name:   April [name]   Subject met inclusion and exclusion criteria.  The informed consent form, study requirements and expectations were reviewed with the subject and questions and concerns were addressed prior to the signing of the consent form.  The subject verbalized understanding of the trial requirements.  The subject agreed to participate in the IDENTIFY trial and signed the informed consent.  The informed consent was obtained prior to performance of any protocol-specific procedures for the subject.  A copy of the signed informed consent was given to the subject and a copy was placed in the subject's medical record.   [place] Arti Trang, Research Assistant  12/18/2019  13:06

## 2019-12-29 HISTORY — PX: OTHER SURGICAL HISTORY: SHX169

## 2020-01-05 HISTORY — PX: OTHER SURGICAL HISTORY: SHX169

## 2020-01-06 ENCOUNTER — Telehealth: Payer: Self-pay | Admitting: Internal Medicine

## 2020-01-06 NOTE — Telephone Encounter (Signed)
She can certainly change to virtual if that works better for her.

## 2020-01-06 NOTE — Telephone Encounter (Signed)
Patient calling  Would like to know if she can do a video visit with Elenor Quinones on 12/10 Please review and advise

## 2020-01-07 ENCOUNTER — Telehealth: Payer: Self-pay | Admitting: Physician Assistant

## 2020-01-07 NOTE — Telephone Encounter (Signed)
  Patient Consent for Virtual Visit         April [name] has provided verbal consent on 01/07/2020 for a virtual visit (video or telephone).   CONSENT FOR VIRTUAL VISIT FOR:  April [name]  By participating in this virtual visit I agree to the following:  I hereby voluntarily request, consent and authorize [place] and its employed or contracted physicians, physician assistants, nurse practitioners or other licensed health care professionals (the Practitioner), to provide me with telemedicine health care services (the "Services") as deemed necessary by the treating Practitioner. I acknowledge and consent to receive the Services by the Practitioner via telemedicine. I understand that the telemedicine visit will involve communicating with the Practitioner through live audiovisual communication technology and the disclosure of certain medical information by electronic transmission. I acknowledge that I have been given the opportunity to request an in-person assessment or other available alternative prior to the telemedicine visit and am voluntarily participating in the telemedicine visit.  I understand that I have the right to withhold or withdraw my consent to the use of telemedicine in the course of my care at any time, without affecting my right to future care or treatment, and that the Practitioner or I may terminate the telemedicine visit at any time. I understand that I have the right to inspect all information obtained and/or recorded in the course of the telemedicine visit and may receive copies of available information for a reasonable fee.  I understand that some of the potential risks of receiving the Services via telemedicine include:  [name] Delay or interruption in medical evaluation due to technological equipment failure or disruption; . Information transmitted may not be sufficient (e.g. poor resolution of images) to allow for appropriate medical decision making by the Practitioner;  and/or  . In rare instances, security protocols could fail, causing a breach of personal health information.  Furthermore, I acknowledge that it is my responsibility to provide information about my medical history, conditions and care that is complete and accurate to the best of my ability. I acknowledge that Practitioner's advice, recommendations, and/or decision may be based on factors not within their control, such as incomplete or inaccurate data provided by me or distortions of diagnostic images or specimens that may result from electronic transmissions. I understand that the practice of medicine is not an exact science and that Practitioner makes no warranties or guarantees regarding treatment outcomes. I acknowledge that a copy of this consent can be made available to me via my patient portal ([facility]), or I can request a printed copy by calling the office of [place].    I understand that my insurance will be billed for this visit.   I have read or had this consent read to me. . I understand the contents of this consent, which adequately explains the benefits and risks of the Services being provided via telemedicine.  . I have been provided ample opportunity to ask questions regarding this consent and the Services and have had my questions answered to my satisfaction. . I give my informed consent for the services to be provided through the use of telemedicine in my medical care

## 2020-01-08 ENCOUNTER — Other Ambulatory Visit: Payer: Self-pay | Admitting: Internal Medicine

## 2020-01-09 ENCOUNTER — Telehealth: Payer: Self-pay | Admitting: Physician Assistant

## 2020-01-09 ENCOUNTER — Telehealth (INDEPENDENT_AMBULATORY_CARE_PROVIDER_SITE_OTHER): Payer: Medicare Other | Admitting: Physician Assistant

## 2020-01-09 ENCOUNTER — Encounter: Payer: Self-pay | Admitting: Physician Assistant

## 2020-01-09 VITALS — BP 143/92 | HR 77 | Ht 65.0 in | Wt 159.0 lb

## 2020-01-09 DIAGNOSIS — M797 Fibromyalgia: Secondary | ICD-10-CM | POA: Diagnosis not present

## 2020-01-09 DIAGNOSIS — M542 Cervicalgia: Secondary | ICD-10-CM

## 2020-01-09 DIAGNOSIS — R002 Palpitations: Secondary | ICD-10-CM

## 2020-01-09 DIAGNOSIS — R0789 Other chest pain: Secondary | ICD-10-CM

## 2020-01-09 NOTE — Patient Instructions (Signed)
Medication Instructions:  - Your physician recommends that you continue on your current medications as directed. Please refer to the Current Medication list given to you today.  *If you need a refill on your cardiac medications before your next appointment, please call your pharmacy*   Lab Work: - none ordered  If you have labs (blood work) drawn today and your tests are completely normal, you will receive your results only by: . MyChart Message (if you have MyChart) OR . A paper copy in the mail If you have any lab test that is abnormal or we need to change your treatment, we will call you to review the results.   Testing/Procedures: - none ordered   Follow-Up: At [facility], you and your health needs are our priority.  As part of our continuing mission to provide you with exceptional heart care, we have created designated Provider Care Teams.  These Care Teams include your primary Cardiologist (physician) and Advanced Practice Providers (APPs -  Physician Assistants and Nurse Practitioners) who all work together to provide you with the care you need, when you need it.  We recommend signing up for the patient portal called "MyChart".  Sign up information is provided on this After Visit Summary.  MyChart is used to connect with patients for Virtual Visits (Telemedicine).  Patients are able to view lab/test results, encounter notes, upcoming appointments, etc.  Non-urgent messages can be sent to your provider as well.   To learn more about what you can do with MyChart, go to [email or web address]://[email or web address].com.    Your next appointment:   6 month(s)  The format for your next appointment:   In Person  Provider:   You may see Christopher End, MD or one of the following Advanced Practice Providers on your designated Care Team:    Christopher Berge, NP  Ryan Dunn, PA-C  Jacquelyn Visser, PA-C  Cadence Furth, PA-C  Caitlin Walker, NP    Other Instructions n/a  

## 2020-01-09 NOTE — Telephone Encounter (Signed)
The patient a Video Visit with Marrianne Mood, PA today. Per Malachi Bonds, at the end of the visit, the patient's screen froze. She was done with the visit and just recommended a 6 month follow up appointment.  I attempted to call the patient back to insure no questions/ concerns. No answer- I left a detailed message on her identified voice mail that Malachi Bonds was unable to say goodbye on her visit today as the patient's screen froze. I advised we will see her back in 6 months and scheduling will reach out to her to set this up. I asked that she call back with any further questions/ concerns.

## 2020-01-09 NOTE — Progress Notes (Signed)
Virtual Visit via Video Note   This visit type was conducted due to national recommendations for restrictions regarding the COVID-19 Pandemic (e.g. social distancing) in an effort to limit this patient's exposure and mitigate transmission in our community.  Due to her co-morbid illnesses, this patient is at least at moderate risk for complications without adequate follow up.  This format is felt to be most appropriate for this patient at this time.  All issues noted in this document were discussed and addressed.  A limited physical exam was performed with this format.  Please refer to the patient's chart for her consent to telehealth for [facility].      Date:  01/09/2020   ID:  April [name], DOB May 21, 1970, MRN [id number]  Patient Location: Home Provider Location: Home Office  PCP:  Jodi Marble, MD  Cardiologist:  [name], MD  Electrophysiologist:  None   Evaluation Performed:  Follow-Up Visit  Chief Complaint:  49 y.o. 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 Bynum Bellows at [facility]) previously on Xarelto discontinued for severe anemia, fibromyalgia, asthma, RA, h/o chest pain, and seen today for 1 month follow-up of tachypalpitations.    Chief Complaint  Patient presents with  . Other    1 month follow up. Patient c.o SOB - surgery on 11/29 & 12/06. Meds reviewed verbally with patient.      History of Present Illness:    April [name] is a 49 y.o. 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  2017 TTE showed normal LVSF with mild MR/TR.  Stress test 2017 without ischemia. 2018 TTE EF 55-60%.  2019 monitor showed  predominantly NSR.   In 2019, she presented with continued c/o intermittent  palpitations. No further testing or medication changes were recommended at that time.  In the past, she has felt her palpitations were connected to her bladder spasms and significantly improved by her bladder stimulator. She reported walking daily on her parent's farm without any limitations.   In September 2020, she started living with her Mom to avoid exposure to COVID-19. During that time, her husband tested COVID-19 positive and remained separate from her at their home in [place]. She reported eating a high salt diet over the next few months d/t her mother's cooking. She subsequently noticed SOB/DOE that was not alleviated with her usual inhalers. She also noted severe orthopnea, reportedly having to sleep sitting up in a recliner. She felt increasingly fatigued with active shingles outbreak. She also noted a distended right jugular vein that became worse with talking or laughing. She continued to take lasix 20mg  daily without any noted LEE and diltiazem without racing HR or palpitations.  She noted weight gain, attributed that to her mother's cooking / caloric intake.  She underwent duplex ultrasound per PCP and performed of the RUE without significant findings.   Echo 01/2019 showed EF 55 to 60%, borderline LVH, NR WMA.  She was seen by her primary cardiologist, Dr. Saunders Revel, 06/18/2019 and had multiple complaints.  She had recently had a tooth extracted.  She continued to notice the prominence of her right neck vein.  She experience intermittent pain attributed to muscle, bone spur, and  shingles.  She reported ongoing tachypalpitations, lasting less than 10 minutes.  She felt as if she was not urinating as much with intermittent edema noted in her legs.  No medication changes or further work-up were recommended.  On 12/09/2019, she returned to clinic and noted ongoing severe pain associated with her right neck muscle  (being evaluated at [facility]).  She had nerve ablations via [facility], resulting in numbness in her scalp. She had ongoing pain from bone spurs.  She reported chest discomfort, though she stated with her multiple comorbid issues / MSK problems, she was unable to tease out if it actual CP and reported significant anxiety regarding this.  She had tachypalpitations and elevated BP. She noted HR over 200bpm for over 4 hours with elevated BP. She had intermittent edema and was sleeping in a recliner for relief of pain.  She reported dyspnea and poor exercise tolerance.    Cardiac CT was ordered with CAC score of 0 and possible PFO. Discussed with structural heart team with no further workup recommended at this time.   She returns to clinic today and reports she is doing well from a cardiac standpoint. She is in pain s/p recent surgery, which has elevated her BP and HR. She reports SOB s/p surgery and for which she is using a spirometer. She is very satisfied with the Cardizem for break through tachypalpitations and reports that her HR and tachypalpitations have been under control, so she actually has not needed to use it but has relief in just knowing that it is there if she did need it. She feels that eliminating stress and anxiety has been key for her, and that the cardiac CT and PRN Cardizem have helped significantly with this relief. She is not able to exercise yet but looking forward to doing so in the near future. She hopes to start walking. For now, she is doing elongating stretches, which have been helpful for her. She has made lots of lifestyle and diet changes, which have significantly helped her overall health. She is watching what she is eating. She is not eating out anymore. She is sleeping between 10PM and 2AM, as she found ut this is the ideal time to sleep. She reports losing a lot of weight recently. She also feels that the bladder stimulator helped her a lot. She denies any presyncope or  syncope. No s/sx of volume overload or bleeding. She reports medication compliance.   The patient does not have symptoms concerning for COVID-19 infection (fever, chills, cough, or new shortness of breath).   Past Medical History:  Diagnosis Date  . Anxiety 12/14/2011  . Asthma    no inhaler  . Chest pain    a. 12/2015 CTA chest: no PE. No significant coronary Ca2+. Mosaic attenuation pattern in both lungs, may reflect small airway dzs; c. 12/2015 Ex MV: Walked 6 mins. No ischemia/scar.  . Diastolic dysfunction    a. 10/2015 Echo: EF nl, mod diast dysfxn. Mild MR/TR; b. 09/2016 Echo: EF 55-60%, no rwma. Nl RV size and fxn.  . Factor 5 Leiden mutation, heterozygous ([place])   . Fibromyalgia   . Fracture of 5th metatarsal 03/2011   left foot -    . Heart palpitations    a. Notes intermittent tachycardia (sinus). Did not tolerate beta blockers 2/2 urinary incontinence.  . Interstitial cystitis   . Ovarian cyst   . Pancreatitis   . Pelvic floor dysfunction   . Rheumatoid arteritis ([place])   .  Sinus infection    chronic  . Syncope    a. 08/2017 Event monitor: Average heart rate 77 (49-136).  Periods of sinus arrhythmia noted.  Triggered events corresponded with sinus rhythm, sinus arrhythmia, and artifact.  No significant ectopy, sustained arrhythmias, or prolonged pauses observed.  . Thrombosis    a. 12/14/2011 Thrombosis of R gonadal vein with extension of thrombus into IVC to level of R renal vein per CT 12/11/12 of [place].   Past Surgical History:  Procedure Laterality Date  . ABDOMINAL HYSTERECTOMY    . CHOLECYSTECTOMY    . CYSTOSCOPY WITH HYDRODISTENSION AND BIOPSY  04/21/2011   Dr Philis Fendt  . LAPAROSCOPIC ASSISTED VAGINAL HYSTERECTOMY  08/21/2011   Procedure: LAPAROSCOPIC ASSISTED VAGINAL HYSTERECTOMY;  Surgeon: Cyril Mourning, MD;  Location: [facility];  Service: Gynecology;  Laterality: N/A;  OPEN LAPAROSCOPIC  . LAPAROSCOPIC NISSEN FUNDOPLICATION  3159  . neck ablation Left  12/29/2019  . neck ablation Right 01/05/2020  . SALPINGOOPHORECTOMY  08/21/2011   Procedure: SALPINGO OOPHERECTOMY;  Surgeon: Cyril Mourning, MD;  Location: [facility];  Service: Gynecology;  Laterality: Bilateral;  . TUBAL LIGATION  2010  . WISDOM TOOTH EXTRACTION       Current Meds  Medication Sig  . albuterol (PROVENTIL HFA;VENTOLIN HFA) 108 (90 BASE) MCG/ACT inhaler Inhale 1 puff into the lungs as needed. For shortness of breath or wheezing  . Ascorbic Acid (VITA-C PO) Take by mouth.  [name] azelastine (ASTELIN) 0.1 % nasal spray as directed.  . baclofen (LIORESAL) 20 MG tablet Take 20 mg by mouth 3 (three) times daily.  . Biotin 10000 MCG TABS Take by mouth daily.  . Calcium-Phosphorus-Vitamin D (CALCIUM GUMMIES PO) Take 1 tablet by mouth every evening.  . Cholecalciferol (VITAMIN D3) 5000 units TABS Take by mouth daily.  [name] co-enzyme Q-10 30 MG capsule Take 30 mg by mouth daily.  . Cyanocobalamin 5000 MCG SUBL Place 5,000 mcg under the tongue daily.   . diazepam (VALIUM) 10 MG tablet Take 10 mg by mouth daily.  [name] dicyclomine (BENTYL) 20 MG tablet Take 20 mg by mouth every 6 (six) hours.  [name] diltiazem (CARDIZEM CD) 120 MG 24 hr capsule TAKE 1 CAPSULE BY MOUTH ONCE DAILY  . diltiazem (CARDIZEM) 30 MG tablet Take 1 tablet (30 mg total) by mouth every 8 (eight) hours as needed (for breakthrough palpitations).  . diphenhydrAMINE (BENADRYL) 50 MG tablet Please take 1 tablet (50mg ) 1 hr prior to scan for contrast allergy  . ELMIRON 100 MG capsule Take 200 mg by mouth 2 (two) times daily.  . fexofenadine (ALLEGRA) 180 MG tablet Take 180 mg by mouth every morning.  . fluticasone (FLONASE) 50 MCG/ACT nasal spray SHAKE LQ AND U 1 TO 2 SPRAYS IEN QD UTD  . Fluticasone-Salmeterol (ADVAIR) 250-50 MCG/DOSE AEPB Inhale 1 puff into the lungs 2 (two) times a day.   . furosemide (LASIX) 20 MG tablet Take 20 mg by mouth daily.  [name] gabapentin (NEURONTIN) 800 MG tablet Take 800 mg by mouth 3 (three) times daily.   [name] guaiFENesin (MUCINEX) 600 MG 12 hr tablet Take 1,200 mg by mouth 2 (two) times daily.  [name] HYDROcodone-acetaminophen (NORCO/VICODIN) 5-325 MG tablet Take 1 tablet by mouth 4 (four) times daily as needed. for pain  . hydrOXYzine (ATARAX/VISTARIL) 50 MG tablet Take 50 mg by mouth at bedtime.  . hyoscyamine (LEVSIN SL) 0.125 MG SL tablet Place 0.125 mg under the tongue every 4 (four) hours as needed for cramping.  [name]  Lactobacillus (ACIDOPHILUS PROBIOTIC PO) Take by mouth 3 (three) times daily.  [name] lidocaine (XYLOCAINE) 5 % ointment Apply topically daily.  . methocarbamol (ROBAXIN) 750 MG tablet Take 750 mg by mouth every 8 (eight) hours.  . metoprolol tartrate (LOPRESSOR) 100 MG tablet Take one tablet by mouth two hours prior to cardiac CT  . montelukast (SINGULAIR) 10 MG tablet Take 10 mg by mouth at bedtime.  . nitrofurantoin (MACRODANTIN) 50 MG capsule Take 50 mg by mouth at bedtime.  [name] oxybutynin (DITROPAN) 5 MG tablet Take 5 mg by mouth 3 (three) times daily as needed.  [name] oxyCODONE (OXY IR/ROXICODONE) 5 MG immediate release tablet Take 5 mg by mouth 3 (three) times daily.  . pantoprazole (PROTONIX) 40 MG tablet Take 40 mg by mouth every morning.  [name] PROCTOZONE-HC 2.5 % rectal cream Apply topically as needed.  . progesterone (PROMETRIUM) 100 MG capsule Take 100 mg by mouth daily.   . Riboflavin (B2) 100 MG TABS Take by mouth daily.  . Simethicone 125 MG CAPS Take 125 mg by mouth 4 (four) times daily.  . TURMERIC PO Take by mouth daily.  . valACYclovir (VALTREX) 500 MG tablet Take 500 mg by mouth 2 (two) times daily.  [name] VALACYCLOVIR HCL PO Take 1,000 mg by mouth daily.   . Zinc 25 MG TABS Take by mouth daily.   Current Facility-Administered Medications for the 01/09/20 encounter (Video Visit) with Arvil Chaco, PA-C  Medication  . betamethasone acetate-betamethasone sodium phosphate (CELESTONE) injection 3 mg     Allergies:   Amitriptyline, Amoxicillin, Bromelains, Celexa [citalopram  hydrobromide], Ivp dye [iodinated diagnostic agents], Nortriptyline, Penicillins, Pregabalin, Bromelains [pineapple extract], Budesonide-formoterol fumarate, Citalopram, Mangifera indica, Methylene blue, Sertraline, Sulfa antibiotics, Sulfonamide derivatives, Bromaline [albertsons di bromm], and Other   Social History   Tobacco Use  . Smoking status: Never Smoker  . Smokeless tobacco: Never Used  Vaping Use  . Vaping Use: Never used  Substance Use Topics  . Alcohol use: No  . Drug use: No     Family Hx: The patient's family history includes Heart Problems in her mother; Vascular Disease in her maternal grandmother and mother. There is no history of Anesthesia problems.  ROS:   Please see the history of present illness.    She denies chest pain, palpitations,, pnd, orthopnea, n, v, dizziness, syncope, edema, weight gain, or early satiety. She reports weight loss. She reports SOB and DOE 2/2 surgery and for which she is using a spirometer.  All other systems reviewed and are negative.  Objective:    Vital Signs:  BP (!) 143/92 (BP Location: Left Arm, Patient Position: Sitting, Cuff Size: Normal)   Pulse 77   Ht 5\' 5"  (1.651 m)   Wt 159 lb (72.1 kg)   LMP 05/20/2011   BMI 26.46 kg/m    VITAL SIGNS:  reviewed GEN:  no acute distress  Accessory clinical findings reviewed:    EKG:  No ECG reviewed.  Previous vitals reviewed today:    Temp Readings from Last 3 Encounters:  01/09/19 98.4 F (36.9 C)  07/13/16 98.7 F (37.1 C) (Oral)  12/19/15 98.5 F (36.9 C) (Oral)   BP Readings from Last 3 Encounters:  01/09/20 (!) 143/92  12/18/19 113/82  12/09/19 114/84   Pulse Readings from Last 3 Encounters:  01/09/20 77  12/18/19 (!) 53  12/09/19 72    Wt Readings from Last 3 Encounters:  01/09/20 159 lb (72.1 kg)  12/09/19 171 lb (77.6 kg)  06/18/19 181 lb (82.1 kg)     Labs reviewed today:    [place] present? No  Lab Results  Component Value Date    WBC 6.8 07/13/2016   HGB 13.0 07/13/2016   HCT 39.0 07/13/2016   MCV 94 07/13/2016   PLT 258 07/13/2016   Lab Results  Component Value Date   CREATININE 0.82 12/09/2019   BUN 5 (L) 12/09/2019   NA 139 12/09/2019   K 4.9 12/09/2019   CL 100 12/09/2019   CO2 24 12/09/2019   Lab Results  Component Value Date   ALT 35 (H) 07/13/2016   AST 18 07/13/2016   ALKPHOS 40 07/13/2016   BILITOT <0.2 07/13/2016   No results found for: CHOL, HDL, LDLCALC, LDLDIRECT, TRIG, CHOLHDL  No results found for: HGBA1C No results found for: TSH   Prior CV Studies reviewed today:    The following studies were reviewed today:   30-day event monitor (09/20/2017): Predominantly sinus rhythm without significant arrhythmia.  TTE (10/16/16): Normal LV size and function. LVEF 55-60% with normal wall motion and diastolic parameters. Normal RV size and function.  Exercise myocardial perfusion stress test (12/16/15): Normal study. No evidence of ischemia or scar. Patient exercised 6 minutes achieving 7 METs with peak heart rate of 151 bpm (85% MPHR).  CTA chest (12/06/15): No pulmonary embolism. No significant coronary artery calcification. Mosaic attenuation pattern in both lungs that may reflect small airway disease.  Transthoracic echocardiogram (10/06/15, [place]): Normal LV function with moderate diastolic dysfunction. Mild mitral and tricuspid regurgitation.  EF 55 to 60%, borderline LVH, NR WMA.  CT cors IMPRESSION: 1. No evidence of CAD, CADRADS = 0. 2. Coronary calcium score of 0. This was 0 percentile for age and sex matched control. 3. Normal coronary origin with left dominance.  ASSESSMENT & PLAN:    Palpitations, Racing HR --No further breakthrough tachypalpitations.  Continue diltiazem 120mg  daily and PRN short acting diltiazem 30 mg every 8 hours for breakthrough tachypalpitations. She has not yet needed the short acting Cardizem, which is reassuring for her. Diet  and lifestyle discussed - continue to increase activity as tolerated.  Atypical CP --No further CP. Reassured by her cardiac CT with CAC score 0.  LEE --No further LEE reported. Continue lasix.   Prominent right external jugular vein  --As noted at her previous visit, this is a normal finding and visible with increased intrathoracic pressure as would be seen with Valsalva.  No significant pathology on physical exam and review of prior echo, as well as cervical spine MRI.  No further work-up or intervention required at this time.  History of syncope/near syncope --No report of recent syncope or near syncope with previous event monitor showing NSR and without arrhythmia. No further workup at this time.  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 Duplex study performed of RUE and without significant findings, per patient.  Disposition: RTC 6 months.   COVID-19 Education: The signs and symptoms of COVID-19 were discussed with the patient and how to seek care for testing (follow up with PCP or arrange E-visit).  The importance of social distancing was discussed today.  Time:   Today, I have spent 51 minutes with the patient with telehealth technology discussing the above problems.     Medication Adjustments/Labs and Tests Ordered: Current medicines are reviewed at length with the patient today.  Concerns regarding medicines are outlined above.    Signed, Arvil Chaco, PA-C  01/09/2020  [facility]

## 2020-02-20 ENCOUNTER — Telehealth: Payer: Self-pay | Admitting: Internal Medicine

## 2020-02-20 NOTE — Telephone Encounter (Signed)
Pt c/o medication issue:  1. Name of Medication: bp meds  2. How are you currently taking this medication (dosage and times per day)?   3. Are you having a reaction (difficulty breathing--STAT)? Nausea headache palpitations fatigue  4. What is your medication issue? bp low at times   but hr high causing discomfort patient states she has medication to take for break through tach but has recently lost weight post op and thinks maybe bp meds are now too strong   BP  HR 105/65  68 98/59  94 96/58  70 103/36  95

## 2020-02-20 NOTE — Telephone Encounter (Signed)
I agree with COVID-19 testing if April [name] feels like her symptoms have worsened from baseline.  Many of her concerns are chronic issues that we have evaluated extensively in the past.  [name], MD [place]

## 2020-02-20 NOTE — Telephone Encounter (Signed)
Spoke with patient.  She's been having these symptoms since February 10, 2020.  Feels like anxiety attack in her heart. Once she sits down to relax, she feels better. When she gets up, she begins to have the feeling and is dizzy, lightheaded and nauseas. She has not been able to do anything other than sit in her recliner and get up to use the bathroom. Her family has been making her meals and bringing them to her since 1/11. Had palpitations last night and took her diltiazem 30 mg prn dose and the palpitations went away. States that's the only time in a while she's had to take it.  Patient thinks she should just monitor her symptoms.  Patient states she did have a temp of 103 on 1/11. Advised her to increase her fluid intake, wear compression hose and call her PCP. She is looking for new primary care provider because her current one has not been available for her as she feels he should. I also advised her that a COVID test may be appropriate though its been several days since her fever and she says she has not been around anyone with COVID or left her house.  Scheduled patient to come in and see JV on 03/04/20. Pt verbalized understanding to call 911 or go to the emergency room, if she develops any new or worsening symptoms. Routing to Dr End for review.

## 2020-03-04 ENCOUNTER — Telehealth: Payer: Self-pay | Admitting: Internal Medicine

## 2020-03-04 ENCOUNTER — Ambulatory Visit: Payer: Medicare Other | Admitting: Physician Assistant

## 2020-03-04 NOTE — Telephone Encounter (Signed)
  Patient Consent for Virtual Visit         April [name] has provided verbal consent on 03/04/2020 for a virtual visit (video or telephone).   CONSENT FOR VIRTUAL VISIT FOR:  April [name]  By participating in this virtual visit I agree to the following:  I hereby voluntarily request, consent and authorize [facility] and its employed or contracted physicians, physician assistants, nurse practitioners or other licensed health care professionals (the Practitioner), to provide me with telemedicine health care services (the "Services") as deemed necessary by the treating Practitioner. I acknowledge and consent to receive the Services by the Practitioner via telemedicine. I understand that the telemedicine visit will involve communicating with the Practitioner through live audiovisual communication technology and the disclosure of certain medical information by electronic transmission. I acknowledge that I have been given the opportunity to request an in-person assessment or other available alternative prior to the telemedicine visit and am voluntarily participating in the telemedicine visit.  I understand that I have the right to withhold or withdraw my consent to the use of telemedicine in the course of my care at any time, without affecting my right to future care or treatment, and that the Practitioner or I may terminate the telemedicine visit at any time. I understand that I have the right to inspect all information obtained and/or recorded in the course of the telemedicine visit and may receive copies of available information for a reasonable fee.  I understand that some of the potential risks of receiving the Services via telemedicine include:  [name] Delay or interruption in medical evaluation due to technological equipment failure or disruption; . Information transmitted may not be sufficient (e.g. poor resolution of images) to allow for appropriate medical decision making by the Practitioner;  and/or  . In rare instances, security protocols could fail, causing a breach of personal health information.  Furthermore, I acknowledge that it is my responsibility to provide information about my medical history, conditions and care that is complete and accurate to the best of my ability. I acknowledge that Practitioner's advice, recommendations, and/or decision may be based on factors not within their control, such as incomplete or inaccurate data provided by me or distortions of diagnostic images or specimens that may result from electronic transmissions. I understand that the practice of medicine is not an exact science and that Practitioner makes no warranties or guarantees regarding treatment outcomes. I acknowledge that a copy of this consent can be made available to me via my patient portal ([facility]), or I can request a printed copy by calling the office of [facility].    I understand that my insurance will be billed for this visit.   I have read or had this consent read to me. . I understand the contents of this consent, which adequately explains the benefits and risks of the Services being provided via telemedicine.  . I have been provided ample opportunity to ask questions regarding this consent and the Services and have had my questions answered to my satisfaction. . I give my informed consent for the services to be provided through the use of telemedicine in my medical care

## 2020-03-04 NOTE — Progress Notes (Deleted)
Office Visit    Patient Name: April [name] Date of Encounter: 03/04/2020  Primary Care Provider:  Jodi Marble, MD Primary Cardiologist:  April Bush, MD  Chief Complaint    No chief complaint on file.   49 y.o. 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    Past Medical History    Past Medical History:  Diagnosis Date  . Anxiety 12/14/2011  . Asthma    no inhaler  . Chest pain    a. 12/2015 CTA chest: no PE. No significant coronary Ca2+. Mosaic attenuation pattern in both lungs, may reflect small airway dzs; c. 12/2015 Ex MV: Walked 6 mins. No ischemia/scar.  . Diastolic dysfunction    a. 10/2015 Echo: EF nl, mod diast dysfxn. Mild MR/TR; b. 09/2016 Echo: EF 55-60%, no rwma. Nl RV size and fxn.  . Factor 5 Leiden mutation, heterozygous ([place])   . Fibromyalgia   . Fracture of 5th metatarsal 03/2011   left foot -    . Heart palpitations    a. Notes intermittent tachycardia (sinus). Did not tolerate beta blockers 2/2 urinary incontinence.  . Interstitial cystitis   . Ovarian cyst   . Pancreatitis   . Pelvic floor dysfunction   . Rheumatoid arteritis ([place])   . Sinus infection    chronic  . Syncope    a. 08/2017 Event monitor: Average heart rate 77 (49-136).  Periods of sinus arrhythmia noted.  Triggered events corresponded with sinus rhythm, sinus arrhythmia, and artifact.  No significant ectopy, sustained arrhythmias, or prolonged pauses observed.  . Thrombosis    a. 12/14/2011 Thrombosis of R gonadal vein with extension of thrombus into IVC to level of R renal vein per CT 12/11/12 of [place].   Past Surgical History:  Procedure Laterality Date  . ABDOMINAL HYSTERECTOMY    . CHOLECYSTECTOMY    .  CYSTOSCOPY WITH HYDRODISTENSION AND BIOPSY  04/21/2011   Dr April [name]  . LAPAROSCOPIC ASSISTED VAGINAL HYSTERECTOMY  08/21/2011   Procedure: LAPAROSCOPIC ASSISTED VAGINAL HYSTERECTOMY;  Surgeon: April Mourning, MD;  Location: [facility];  Service: Gynecology;  Laterality: N/A;  OPEN LAPAROSCOPIC  . LAPAROSCOPIC NISSEN FUNDOPLICATION  [id number]  . neck ablation Left 12/29/2019  . neck ablation Right 01/05/2020  . SALPINGOOPHORECTOMY  08/21/2011   Procedure: SALPINGO OOPHERECTOMY;  Surgeon: April Mourning, MD;  Location: [facility];  Service: Gynecology;  Laterality: Bilateral;  . TUBAL LIGATION  2010  . WISDOM TOOTH EXTRACTION      Allergies  Allergies  Allergen Reactions  . Amitriptyline Other (See Comments)    Other reaction(s): Other Change in mental status  . Amoxicillin Rash  . Bromelains     Other reaction(s): Abdominal pain  . Celexa [Citalopram Hydrobromide] Other (See Comments)    Other reaction(s): Other Change in mental status  . Ivp Dye [Iodinated Diagnostic Agents] Anaphylaxis    Pt reports "I do not have contrast allergy and have received IVP dye without pre-med with NO reaction."   . Nortriptyline     Other reaction(s): Other  . Penicillins Swelling and Rash  . Pregabalin     Other reaction(s): Myalgias (muscle pain)  . Bromelains [Pineapple Extract] Other (See Comments)    Severe pain  . Budesonide-Formoterol Fumarate  Other reaction(s): Other  . Citalopram Other (See Comments)  . Mangifera Indica Swelling  . Methylene Blue Other (See Comments)    Caused inflamed pancreas, per pt should not use  . Sertraline Other (See Comments)    Other reaction(s): Other Suicidal thoughts  . Sulfa Antibiotics Other (See Comments)  . Sulfonamide Derivatives Other (See Comments)    excrutiating pain, patient states it caused her to have osteoarthritis  . Bromaline [Albertsons Di Bromm] Rash  . Other Rash    Mango    History of Present Illness    DELIMA [name] is a 49  y.o. 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  2017 TTE showed normal LVSF with mild MR/TR.  Stress test 2017 without ischemia. 2018 TTE EF 55-60%.  2019 monitor showed predominantly NSR.  In 2019,she presented with continued c/o intermittent palpitations. No further testing or medication changes were recommended at that time. In the past, she has felt herpalpitations were connected to her bladder spasms and significantly improved by her bladder stimulator. She reported walking [place] her parent's [place] without any limitations.   InSeptember 2020, she started living with her Mom to avoid exposure to COVID-19. She noticed SOB/DOE that was not alleviated with her usual inhalers. She also noted distended right jugular vein that became worse with talking or laughing. She continued to take lasix 20mg  dail and diltiazem. She noted weight gain,attributed that to her mother's cooking / caloric intake.  She underwentduplex ultrasoundper PCP andperformed of the RUE without significant findings.  Echo 1/2021showed EF 55 to 60%, borderline LVH, NR WMA.  She was seen by her primary cardiologist, Dr.End,06/18/2019 and had multiple complaints. She had recently had a tooth extracted. She continued to notice the prominence of her right neck vein. She experience intermittent pain attributed to muscle, bone spur, and shingles. She reported ongoing tachypalpitations, lasting less than 10 minutes. She felt as if she was not urinating as much with intermittent edema noted in her legs. No medication changes or further work-up were recommended.  On 12/09/2019, she returned to clinic and noted ongoing severe pain associated with her  right neck muscle (being evaluated at [facility]). She had nerve ablations via [facility], resulting in numbness in her scalp. She had ongoing pain from bone spurs. She reported chest discomfort, tachypalpitations with HR over 200bpm for 4hrs, and elevated BP.  She had intermittent edema and was sleeping in a recliner 2/2 pain. She had DOE and poor exercise tolerance.   Cardiac CT was ordered with CAC score of 0 and possible PFO. Discussed with structural heart team with no further workup recommended at this time.   Seen via telemedicine 12/2019 and s/p recent surgery with SOB using a spirometer.  She noted stress relief after learning results of cardiac CT and also knowing she has her PRN Cardizem. She hoped to start walking and was doing elongating stretches. She reported other lifestyle and diet changes with successful wt loss and that included going to bed between 10PM and 2AM.   She called the clinic 1/21 with report of anxiet, dizziness, nausea, and decreased energy. She was using Cardizem for breakthrough tachypalpitations with success. She had a temp of 103 on 1/11. She was advised to call her PCP and COVID19 test  with pt report she needed a new PCP.  Home Medications    Current Outpatient Medications on File Prior to Visit  Medication Sig Dispense Refill  . albuterol (PROVENTIL HFA;VENTOLIN HFA) 108 (90 BASE) MCG/ACT inhaler Inhale 1 puff into the lungs as needed. For shortness of breath or wheezing    . Ascorbic Acid (VITA-C PO) Take by mouth.    [name] azelastine (ASTELIN) 0.1 % nasal spray as directed.    . baclofen (LIORESAL) 20 MG tablet Take 20 mg by mouth 3 (three) times daily.    . Biotin 10000 MCG TABS Take by mouth daily.    . Calcium-Phosphorus-Vitamin D (CALCIUM GUMMIES PO) Take 1 tablet by mouth every evening.    . Cholecalciferol (VITAMIN D3) 5000 units TABS Take by mouth daily.    [name] co-enzyme Q-10 30 MG capsule Take 30 mg by mouth daily.    . Cyanocobalamin 5000 MCG  SUBL Place 5,000 mcg under the tongue daily.     . diazepam (VALIUM) 10 MG tablet Take 10 mg by mouth daily.    [name] dicyclomine (BENTYL) 20 MG tablet Take 20 mg by mouth every 6 (six) hours.    [name] diltiazem (CARDIZEM CD) 120 MG 24 hr capsule TAKE 1 CAPSULE BY MOUTH ONCE DAILY 90 capsule 0  . diltiazem (CARDIZEM) 30 MG tablet Take 1 tablet (30 mg total) by mouth every 8 (eight) hours as needed (for breakthrough palpitations). 30 tablet 1  . diphenhydrAMINE (BENADRYL) 50 MG tablet Please take 1 tablet (50mg ) 1 hr prior to scan for contrast allergy 1 tablet 0  . ELMIRON 100 MG capsule Take 200 mg by mouth 2 (two) times daily.  0  . fexofenadine (ALLEGRA) 180 MG tablet Take 180 mg by mouth every morning.    . fluticasone (FLONASE) 50 MCG/ACT nasal spray SHAKE LQ AND U 1 TO 2 SPRAYS IEN QD UTD  3  . Fluticasone-Salmeterol (ADVAIR) 250-50 MCG/DOSE AEPB Inhale 1 puff into the lungs 2 (two) times a day.     . furosemide (LASIX) 20 MG tablet Take 20 mg by mouth daily.    [name] gabapentin (NEURONTIN) 800 MG tablet Take 800 mg by mouth 3 (three) times daily.    [name] guaiFENesin (MUCINEX) 600 MG 12 hr tablet Take 1,200 mg by mouth 2 (two) times daily.    [name] HYDROcodone-acetaminophen (NORCO/VICODIN) 5-325 MG tablet Take 1 tablet by mouth 4 (four) times daily as needed. for pain    . hydrOXYzine (ATARAX/VISTARIL) 50 MG tablet Take 50 mg by mouth at bedtime.    . hyoscyamine (LEVSIN SL) 0.125 MG SL tablet Place 0.125 mg under the tongue every 4 (four) hours as needed for cramping.    . Lactobacillus (ACIDOPHILUS PROBIOTIC PO) Take by mouth 3 (three) times daily.    [name] lidocaine (XYLOCAINE) 5 % ointment Apply topically daily.    . methocarbamol (ROBAXIN) 750 MG tablet Take 750 mg by mouth every 8 (eight) hours.    . metoprolol tartrate (LOPRESSOR) 100 MG tablet Take one tablet by mouth two hours prior to cardiac CT 1 tablet 0  . montelukast (SINGULAIR) 10 MG tablet Take 10 mg by mouth at bedtime.    . nitrofurantoin  (MACRODANTIN) 50 MG capsule Take 50 mg by mouth at bedtime.    [name] oxybutynin (DITROPAN) 5 MG tablet Take 5 mg by mouth 3 (three) times daily as needed.    [name] oxyCODONE (OXY IR/ROXICODONE) 5 MG immediate release tablet Take 5 mg by mouth 3 (three)  times daily.    . pantoprazole (PROTONIX) 40 MG tablet Take 40 mg by mouth every morning.    [name] PROCTOZONE-HC 2.5 % rectal cream Apply topically as needed.  3  . progesterone (PROMETRIUM) 100 MG capsule Take 100 mg by mouth daily.     . Riboflavin (B2) 100 MG TABS Take by mouth daily.    . Simethicone 125 MG CAPS Take 125 mg by mouth 4 (four) times daily.    . TURMERIC PO Take by mouth daily.    . valACYclovir (VALTREX) 500 MG tablet Take 500 mg by mouth 2 (two) times daily.    [name] VALACYCLOVIR HCL PO Take 1,000 mg by mouth daily.   2  . Zinc 25 MG TABS Take by mouth daily.     Current Facility-Administered Medications on File Prior to Visit  Medication Dose Route Frequency Provider Last Rate Last Admin  . betamethasone acetate-betamethasone sodium phosphate (CELESTONE) injection 3 mg  3 mg Intramuscular Once Edrick Kins, DPM        Review of Systems    ***.   All other systems reviewed and are otherwise negative except as noted above.  Physical Exam    VS:  LMP 05/20/2011  , BMI There is no height or weight on file to calculate BMI. GEN: Well nourished, well developed, in no acute distress. HEENT: normal. Neck: Supple, no JVD, carotid bruits, or masses. Cardiac: RRR, no murmurs, rubs, or gallops. No clubbing, cyanosis, edema.  Radials/DP/PT 2+ and equal bilaterally.  Respiratory:  Respirations regular and unlabored, clear to auscultation bilaterally. GI: Soft, nontender, nondistended, BS + x 4. MS: no deformity or atrophy. Skin: warm and dry, no rash. Neuro:  Strength and sensation are intact. Psych: Normal affect.  Accessory Clinical Findings    ECG personally reviewed by me today - *** - no acute changes.  VITALS Reviewed today    Temp Readings from Last 3 Encounters:  01/09/19 98.4 F (36.9 C)  07/13/16 98.7 F (37.1 C) (Oral)  12/19/15 98.5 F (36.9 C) (Oral)   BP Readings from Last 3 Encounters:  01/09/20 (!) 143/92  12/18/19 113/82  12/09/19 114/84   Pulse Readings from Last 3 Encounters:  01/09/20 77  12/18/19 (!) 53  12/09/19 72    Wt Readings from Last 3 Encounters:  01/09/20 159 lb (72.1 kg)  12/09/19 171 lb (77.6 kg)  06/18/19 181 lb (82.1 kg)     LABS  reviewed today   Lab Results  Component Value Date   WBC 6.8 07/13/2016   HGB 13.0 07/13/2016   HCT 39.0 07/13/2016   MCV 94 07/13/2016   PLT 258 07/13/2016   Lab Results  Component Value Date   CREATININE 0.82 12/09/2019   BUN 5 (L) 12/09/2019   NA 139 12/09/2019   K 4.9 12/09/2019   CL 100 12/09/2019   CO2 24 12/09/2019   Lab Results  Component Value Date   ALT 35 (H) 07/13/2016   AST 18 07/13/2016   ALKPHOS 40 07/13/2016   BILITOT <0.2 07/13/2016   No results [name] for: CHOL, HDL, LDLCALC, LDLDIRECT, TRIG, CHOLHDL  No results [name] for: HGBA1C No results [name] for: TSH   STUDIES/PROCEDURES reviewed today   *** The following studies were reviewed today:   30-day event monitor (09/20/2017): Predominantly sinus rhythm without significant arrhythmia.  TTE (10/16/16): Normal LV size and function. LVEF 55-60% with normal wall motion and diastolic parameters. Normal RV size and function.  Exercise myocardial perfusion stress  test (12/16/15): Normal study. No evidence of ischemia or scar. Patient exercised 6 minutes achieving 7 METs with peak heart rate of 151 bpm (85% MPHR).  CTA chest (12/06/15): No pulmonary embolism. No significant coronary artery calcification. Mosaic attenuation pattern in both lungs that may reflect small airway disease.  Transthoracic echocardiogram (10/06/15, [place]): Normal LV function with moderate diastolic dysfunction. Mild mitral and tricuspid regurgitation.  EF 55 to  60%, borderline LVH, NR WMA.  CT cors IMPRESSION: 1. No evidence of CAD, CADRADS = 0. 2. Coronary calcium score of 0. This was 0 percentile for age and sex matched control. 3. Normal coronary origin with left dominance.  Assessment & Plan    *** Palpitations, Racing HR --No further breakthrough tachypalpitations.Continue diltiazem120mg  daily and PRN short acting diltiazem 30 mg every 8 hours for breakthrough tachypalpitations. She has not yet needed the short acting Cardizem, which is reassuring for her.Diet and lifestyle discussed - continue toincrease activity as tolerated.  Atypical CP --No further CP. Reassured by her cardiac CT with CAC score 0.  LEE --No further LEE reported. Continue lasix.  Prominent right external jugular vein --As noted at her previous visit, this is a normal finding and visible with increased intrathoracic pressure as would be seen with Valsalva. No significant pathology on physical exam and review of prior echo, as well as cervical spine MRI. No further work-up or intervention required at this time.  Historyof syncope/near syncope --No report of recent syncope or near syncope with previous event monitor showing NSR and without arrhythmia.No further workup at this time.  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 Duplex study performed of RUE and without significant findings, per patient  Medication changes: *** Labs ordered: *** Studies / Imaging ordered: *** Future considerations: *** Disposition: ***  Total time spent with patient today *** minutes. This includes reviewing records, evaluating the patient, and coordinating care. Face-to-face time >50%.    Arvil Chaco, PA-C 03/04/2020

## 2020-03-05 ENCOUNTER — Other Ambulatory Visit: Payer: Self-pay

## 2020-03-05 ENCOUNTER — Encounter: Payer: Self-pay | Admitting: Physician Assistant

## 2020-03-05 ENCOUNTER — Telehealth (INDEPENDENT_AMBULATORY_CARE_PROVIDER_SITE_OTHER): Payer: Medicare Other | Admitting: Physician Assistant

## 2020-03-05 VITALS — BP 96/62 | HR 80 | Ht 67.0 in | Wt 159.0 lb

## 2020-03-05 DIAGNOSIS — R0600 Dyspnea, unspecified: Secondary | ICD-10-CM

## 2020-03-05 DIAGNOSIS — R002 Palpitations: Secondary | ICD-10-CM | POA: Diagnosis not present

## 2020-03-05 DIAGNOSIS — I5189 Other ill-defined heart diseases: Secondary | ICD-10-CM | POA: Diagnosis not present

## 2020-03-05 DIAGNOSIS — M542 Cervicalgia: Secondary | ICD-10-CM | POA: Diagnosis not present

## 2020-03-05 NOTE — Patient Instructions (Addendum)
Medication Instructions:  Your physician has recommended you make the following change in your medication:   HOLD Lasix   *If you need a refill on your cardiac medications before your next appointment, please call your pharmacy*   Lab Work: Your physician recommends that you return for lab work at [facility] at [facility] as soon as you are able to for Bmet and BNP. You do NOT have to be fasting for these labs.    Testing/Procedures:  Your physician has requested that you have an echocardiogram. Echocardiography is a painless test that uses sound waves to create images of your heart. It provides your doctor with information about the size and shape of your heart and how well your heart's chambers and valves are working. This procedure takes approximately one hour. There are no restrictions for this procedure.   Follow-Up: At [facility], you and your health needs are our priority.  As part of our continuing mission to provide you with exceptional heart care, we have created designated Provider Care Teams.  These Care Teams include your primary Cardiologist (physician) and Advanced Practice Providers (APPs -  Physician Assistants and Nurse Practitioners) who all work together to provide you with the care you need, when you need it.  We recommend signing up for the patient portal called "MyChart".  Sign up information is provided on this After Visit Summary.  MyChart is used to connect with patients for Virtual Visits (Telemedicine).  Patients are able to view lab/test results, encounter notes, upcoming appointments, etc.  Non-urgent messages can be sent to your provider as well.   To learn more about what you can do with MyChart, go to [email or web address].    Your next appointment:   Follow up AFTER Echo  The format for your next appointment:   In Person  Provider:   You may see [name], MD or one of the following Advanced Practice Providers on your designated Care Team:     Murray Hodgkins, NP  Christell Faith, PA-C  Marrianne Mood, PA-C  Cadence Conover, [place]  Laurann Montana, NP    Other Instructions  1) Continue to check your BP at home 2) Leg elevation at home for leg swelling 3) Do not drink Gatorade

## 2020-03-05 NOTE — Progress Notes (Signed)
Virtual Visit via Telephone Note   This visit type was conducted due to national recommendations for restrictions regarding the COVID-19 Pandemic (e.g. social distancing) in an effort to limit this patient's exposure and mitigate transmission in our community.  Due to her co-morbid illnesses, this patient is at least at moderate risk for complications without adequate follow up.  This format is felt to be most appropriate for this patient at this time.  The patient did not have access to video technology/had technical difficulties with video requiring transitioning to audio format only (telephone).  All issues noted in this document were discussed and addressed.  No physical exam could be performed with this format.  Please refer to the patient's chart for her  consent to telehealth for [facility].   Date:  03/08/2020   ID:  April [name], DOB [birth year], MRN [id number]  Patient Location: Home Provider Location: Office/Clinic  PCP:  Jodi Marble, MD  Cardiologist:  [name], MD  Electrophysiologist:  None   Evaluation Performed:  Follow-Up Visit  Chief Complaint:  50 y.o. 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  Chief Complaint  Patient presents with  . Dizziness    Patient c/o decreased blood pressure with dizziness. Medications reviewed by the patient verbally.      History of Present Illness:    April [name] is a 50 y.o. 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  2017 TTE showed normal LVSF with mild MR/TR.  Stress test 2017 without ischemia. 2018 TTE EF 55-60%.  2019 monitor showed predominantly NSR.  In 2019,she presented with continued c/o intermittent palpitations. No further testing or medication changes were recommended at that time. In the past, she has felt herpalpitations were connected to her bladder spasms and significantly improved by her bladder stimulator. She reported walking daily on her parent's farm without any limitations.   InSeptember 2020, she started living with her Mom to avoid exposure to COVID-19. She noticed SOB/DOE that was not alleviated with her usual inhalers. She also noted distended right jugular vein that became worse with talking or laughing. She continued to take lasix 20mg  dail and diltiazem. She noted weight gain,attributed that to her mother's cooking / caloric intake.  She underwentduplex ultrasoundper PCP andperformed of the RUE without significant findings.  Echo 1/2021showed EF 55 to 60%, borderline LVH, NR WMA.  She was seen by her primary cardiologist, Dr.End,06/18/2019 and had multiple complaints. She had recently had a tooth extracted. She continued to notice the prominence of her right neck vein. She experience intermittent pain attributed to muscle, bone spur, and shingles. She reported ongoing tachypalpitations, lasting less than 10 minutes. She felt as if she was not urinating as much with intermittent edema noted in her legs. No medication changes or further work-up were recommended.  On11/10/2019, she returnedto clinic and notedongoing severe pain associated with her right neck muscle (being evaluated [facility]). [facility],resulting innumbness in her scalp. She had ongoing pain from bone spurs. She reportedchest discomfort, tachypalpitations with HR over 200bpm for 4hrs, and  elevated BP. She had intermittentedema  andwassleeping in a recliner 2/2 pain. She hadDOE and poor exercise tolerance.   Cardiac CT was ordered with CAC score of 0 and possible PFO. Discussed with structural heart team with no further workup recommended at this time.   Seen via telemedicine 12/2019 and s/p recent surgery with SOB using a spirometer.  She noted stress relief after learning results of cardiac CT and also knowing she has her PRN Cardizem. She hoped to start walking and was doing elongating stretches. She reported other lifestyle and diet changes with successful wt loss and that included going to bed between 10PM and 2AM.   She called the clinic 1/21 with report of anxiet, dizziness, nausea, and decreased energy. She was using Cardizem for breakthrough tachypalpitations with success. She had a temp of 103 on 1/11. She was advised to call her PCP and COVID19 test with pt report she needed a new PCP.  Today, 03/05/20, she is initially seen via telemedicine video visit but transferred to telephone due to connection problems mid visit. She notes recent illness and low BP feeling like she is blacking out and checking her BP often with BP 103/36, HR 95. BP 96/60, 100/68 HR 101, 98/69 HR 84. She reports 2 cups of coffee daily. She is trying to stay well hydrated; however, before transition to telephone only today, she lifts the skin on the back of her hand in front of the camera with skin still elevated for several minutes after, consistent with dehydration.She has been drinking gatorade. She reports she was recently told to add steroids to her regimen for her pain and is now looking into finding a new MD. She had some breakthrough tachypalpitations and associated CP that resolved with her PRN Cardizem and has not recurred. She is unsure if these occurred while dehydrated but does note some recent diarrhea recently, estimating diarrhea 11-12 times per day, so does agree that this may be racing HR in that setting. She also does agree  that she may be dehydrated, because on further questioning, feels as if she may be hydrating less, which may be the reason for her dizziness. Other than her 1 episode of breakthru tachypalpitations, she denies recurrent CP but does note ongoing presyncope and dizziness. Dizziness mainly occurs with position changes. She notes ongoing low BP. She reports wt gain and edema; however, wt gain is also in the setting of recent wt loss. No other reported s/symptoms of overload such as PND, orthopnea, or early satiety. She denies any s/sx of acute bleeding. She has not yet tested for COVID-19.  The patient does have symptoms concerning for COVID-19 infection (fever, chills, cough, or new shortness of breath).   Past Medical History:  Diagnosis Date  . Anxiety 12/14/2011  . Asthma    no inhaler  . Chest pain    a. 12/2015 CTA chest: no PE. No significant coronary Ca2+. Mosaic attenuation pattern in both lungs, may reflect small airway dzs; c. 12/2015 Ex MV: Walked 6 mins. No ischemia/scar.  . Diastolic dysfunction    a. 10/2015 Echo: EF nl, mod diast dysfxn. Mild MR/TR; b. 09/2016 Echo: EF 55-60%, no rwma. Nl RV size and fxn.  . Factor 5 Leiden mutation, heterozygous ([place])   . Fibromyalgia   . Fracture of 5th metatarsal 03/2011   left foot -    . Heart palpitations    a. Notes intermittent tachycardia (sinus). Did not tolerate beta blockers 2/2 urinary incontinence.  . Interstitial cystitis   .  Ovarian cyst   . Pancreatitis   . Pelvic floor dysfunction   . Rheumatoid arteritis ([place])   . Sinus infection    chronic  . Syncope    a. 08/2017 Event monitor: Average heart rate 77 (49-136).  Periods of sinus arrhythmia noted.  Triggered events corresponded with sinus rhythm, sinus arrhythmia, and artifact.  No significant ectopy, sustained arrhythmias, or prolonged pauses observed.  . Thrombosis    a. 12/14/2011 Thrombosis of R gonadal vein with extension of thrombus into IVC to level of R renal vein per  CT 12/11/12 of [place].   Past Surgical History:  Procedure Laterality Date  . ABDOMINAL HYSTERECTOMY    . CHOLECYSTECTOMY    . CYSTOSCOPY WITH HYDRODISTENSION AND BIOPSY  04/21/2011   Dr Philis Fendt  . LAPAROSCOPIC ASSISTED VAGINAL HYSTERECTOMY  08/21/2011   Procedure: LAPAROSCOPIC ASSISTED VAGINAL HYSTERECTOMY;  Surgeon: Cyril Mourning, MD;  Location: [facility];  Service: Gynecology;  Laterality: N/A;  OPEN LAPAROSCOPIC  . LAPAROSCOPIC NISSEN FUNDOPLICATION  6578  . neck ablation Left 12/29/2019  . neck ablation Right 01/05/2020  . SALPINGOOPHORECTOMY  08/21/2011   Procedure: SALPINGO OOPHERECTOMY;  Surgeon: Cyril Mourning, MD;  Location: [facility];  Service: Gynecology;  Laterality: Bilateral;  . TUBAL LIGATION  2010  . WISDOM TOOTH EXTRACTION       Current Meds  Medication Sig  . albuterol (PROVENTIL HFA;VENTOLIN HFA) 108 (90 BASE) MCG/ACT inhaler Inhale 1 puff into the lungs as needed. For shortness of breath or wheezing  . Ascorbic Acid (VITA-C PO) Take by mouth.  [name] azelastine (ASTELIN) 0.1 % nasal spray as directed.  . Biotin 10000 MCG TABS Take by mouth daily.  . Calcium-Phosphorus-Vitamin D (CALCIUM GUMMIES PO) Take 1 tablet by mouth every evening.  . Cholecalciferol (VITAMIN D3) 5000 units TABS Take by mouth daily.  [name] co-enzyme Q-10 30 MG capsule Take 30 mg by mouth daily.  . Cyanocobalamin 5000 MCG SUBL Place 5,000 mcg under the tongue daily.   . diazepam (VALIUM) 10 MG tablet Take 10 mg by mouth daily.  [name] dicyclomine (BENTYL) 20 MG tablet Take 20 mg by mouth every 6 (six) hours.  [name] diltiazem (CARDIZEM CD) 120 MG 24 hr capsule TAKE 1 CAPSULE BY MOUTH ONCE DAILY  . diltiazem (CARDIZEM) 30 MG tablet Take 1 tablet (30 mg total) by mouth every 8 (eight) hours as needed (for breakthrough palpitations).  . diphenhydrAMINE (BENADRYL) 50 MG tablet Please take 1 tablet (50mg ) 1 hr prior to scan for contrast allergy  . ELMIRON 100 MG capsule Take 200 mg by mouth 2 (two) times daily.   . fexofenadine (ALLEGRA) 180 MG tablet Take 180 mg by mouth every morning.  . fluticasone (FLONASE) 50 MCG/ACT nasal spray SHAKE LQ AND U 1 TO 2 SPRAYS IEN QD UTD  . Fluticasone-Salmeterol (ADVAIR) 250-50 MCG/DOSE AEPB Inhale 1 puff into the lungs 2 (two) times a day.   . gabapentin (NEURONTIN) 800 MG tablet Take 800 mg by mouth 3 (three) times daily.  [name] guaiFENesin (MUCINEX) 600 MG 12 hr tablet Take 1,200 mg by mouth 2 (two) times daily.  [name] HYDROcodone-acetaminophen (NORCO/VICODIN) 5-325 MG tablet Take 1 tablet by mouth 4 (four) times daily as needed. for pain  . hydrOXYzine (ATARAX/VISTARIL) 50 MG tablet Take 50 mg by mouth at bedtime.  . hyoscyamine (LEVSIN SL) 0.125 MG SL tablet Place 0.125 mg under the tongue every 4 (four) hours as needed for cramping.  . Lactobacillus (ACIDOPHILUS PROBIOTIC PO) Take by mouth  3 (three) times daily.  [name] lidocaine (XYLOCAINE) 5 % ointment Apply topically daily.  . methocarbamol (ROBAXIN) 750 MG tablet Take 750 mg by mouth every 8 (eight) hours.  . montelukast (SINGULAIR) 10 MG tablet Take 10 mg by mouth at bedtime.  . nitrofurantoin (MACRODANTIN) 50 MG capsule Take 50 mg by mouth at bedtime.  [name] oxybutynin (DITROPAN) 5 MG tablet Take 5 mg by mouth 3 (three) times daily as needed.  [name] oxyCODONE (OXY IR/ROXICODONE) 5 MG immediate release tablet Take 5 mg by mouth 3 (three) times daily.  . pantoprazole (PROTONIX) 40 MG tablet Take 40 mg by mouth every morning.  [name] PROCTOZONE-HC 2.5 % rectal cream Apply topically as needed.  . progesterone (PROMETRIUM) 100 MG capsule Take 100 mg by mouth daily.   . Riboflavin (B2) 100 MG TABS Take by mouth daily.  . Simethicone 125 MG CAPS Take 125 mg by mouth 4 (four) times daily.  . TURMERIC PO Take by mouth daily.  . valACYclovir (VALTREX) 500 MG tablet Take 500 mg by mouth 2 (two) times daily.  [name] VALACYCLOVIR HCL PO Take 1,000 mg by mouth daily.   . vitamin k 100 MCG tablet Take 100 mcg by mouth daily.  . Zinc 25 MG TABS  Take by mouth daily.  . [DISCONTINUED] furosemide (LASIX) 20 MG tablet Take 20 mg by mouth daily.   Current Facility-Administered Medications for the 03/05/20 encounter (Video Visit) with Arvil Chaco, PA-C  Medication  . betamethasone acetate-betamethasone sodium phosphate (CELESTONE) injection 3 mg     Allergies:   Amitriptyline, Amoxicillin, Bromelains, Celexa [citalopram hydrobromide], Ivp dye [iodinated diagnostic agents], Nortriptyline, Penicillins, Pregabalin, Bromelains [pineapple extract], Budesonide-formoterol fumarate, Citalopram, Mangifera indica, Methylene blue, Sertraline, Sulfa antibiotics, Sulfonamide derivatives, Bromaline [albertsons di bromm], and Other   Social History   Tobacco Use  . Smoking status: Never Smoker  . Smokeless tobacco: Never Used  Vaping Use  . Vaping Use: Never used  Substance Use Topics  . Alcohol use: No  . Drug use: No     Family Hx: The patient's family history includes Heart Problems in her mother; Vascular Disease in her maternal grandmother and mother. There is no history of Anesthesia problems.  ROS:   Please see the history of present illness.    She reports 1 episode of CP and tachypalpitations, resolved with Cardizem. She notes dizziness, mainly with position changes. She notes low BP. She reports diarrhea and less hydration. Reports edema and wt gain though in the setting of wt loss as well. She denies dyspnea, pnd, orthopnea, n, v, syncope, or early satiety.  All other systems reviewed and are negative.  Objective:    Vital Signs:  BP 96/62   Pulse 80   Ht 5\' 7"  (1.702 m)   Wt 159 lb (72.1 kg)   LMP 05/20/2011   BMI 24.90 kg/m    Exam before video visit changed to telephone as below  VITAL SIGNS:  reviewed GEN:  no acute distress EYES:  sclerae anicteric, EOMI - Extraocular Movements Intact SKIN:  Decreased skin turgor. No rashes or sores appreciated. MUSCULOSKELETAL:  no obvious deformities. NEURO:  alert and  oriented x 3, no obvious focal deficit PSYCH:  normal affect  Accessory clinical findings reviewed:    EKG:  No ECG reviewed.  Previous vitals reviewed today:    Temp Readings from Last 3 Encounters:  01/09/19 98.4 F (36.9 C)  07/13/16 98.7 F (37.1 C) (Oral)  12/19/15 98.5 F (36.9 C) (Oral)  BP Readings from Last 3 Encounters:  03/05/20 96/62  01/09/20 (!) 143/92  12/18/19 113/82   Pulse Readings from Last 3 Encounters:  03/05/20 80  01/09/20 77  12/18/19 (!) 53    Wt Readings from Last 3 Encounters:  03/05/20 159 lb (72.1 kg)  01/09/20 159 lb (72.1 kg)  12/09/19 171 lb (77.6 kg)     Labs reviewed today:      Lab Results  Component Value Date   WBC 6.8 07/13/2016   HGB 13.0 07/13/2016   HCT 39.0 07/13/2016   MCV 94 07/13/2016   PLT 258 07/13/2016   Lab Results  Component Value Date   CREATININE 0.82 12/09/2019   BUN 5 (L) 12/09/2019   NA 139 12/09/2019   K 4.9 12/09/2019   CL 100 12/09/2019   CO2 24 12/09/2019   Lab Results  Component Value Date   ALT 35 (H) 07/13/2016   AST 18 07/13/2016   ALKPHOS 40 07/13/2016   BILITOT <0.2 07/13/2016   No results found for: CHOL, HDL, LDLCALC, LDLDIRECT, TRIG, CHOLHDL  No results found for: HGBA1C No results found for: TSH   Prior CV Studies reviewed today:    The following studies were reviewed today:   30-day event monitor (09/20/2017): Predominantly sinus rhythm without significant arrhythmia.  TTE (10/16/16): Normal LV size and function. LVEF 55-60% with normal wall motion and diastolic parameters. Normal RV size and function.  Exercise myocardial perfusion stress test (12/16/15): Normal study. No evidence of ischemia or scar. Patient exercised 6 minutes achieving 7 METs with peak heart rate of 151 bpm (85% MPHR).  CTA chest (12/06/15): No pulmonary embolism. No significant coronary artery calcification. Mosaic attenuation pattern in both lungs that may reflect small airway  disease.  Transthoracic echocardiogram (10/06/15, [place]): Normal LV function with moderate diastolic dysfunction. Mild mitral and tricuspid regurgitation.  EF 55 to 60%, borderline LVH, NR WMA.  CT cors IMPRESSION: 1. No evidence of CAD, CADRADS = 0. 2. Coronary calcium score of 0. This was 0 percentile for age and sex matched control. 3. Normal coronary origin with left dominance.  ASSESSMENT & PLAN:    Palpitations, Racing HR --No further breakthrough tachypalpitations since her 1 episode reported as above and thought possibly due to dehydration due to low BP and rapid HR after frequent diarrhea and with skin turgor seen today.Continue diltiazem120mg  daily and PRN short acting diltiazem 30 mg every 8 hours for breakthrough tachypalpitations. Diet and lifestyle discussed. Recommend against Gatorade but instructed to increase hydration, especially given recent diarrhea. Hold lasix, given dehydration. Continue to monitor BP, HR. Given her concern noted today and recent illness, will obtain BMET to reassess renal function and electrolytes and repeat echo once feeling well again. COVID-19 testing encouraged. Recommended increased hydration. Reduced salt / no Gatorade.  Atypical CP --No further CP since her episode of tachypalpitations. Atypical CP noted as above. Previous cardiac workup unrevealing with low concern for cardiac ischemia at this time. Previously reassured by her cardiac CT with CAC score 0.  Consider CP as 2/2 recent dehydration after diarrhea with racing HR and low BP at that time. Also considered is previous comorbid neck and MSK issues as contributing with MSK issues possibly worse with dehydration. After long discussion with pt, will update echo for given recent illness and report of overload over the phone today and once feeling better.   Likely Dehydration s/p frequent diarrhea Orthostatic hypotension  --Reports frequent diarrhea. Low BP and  orthostasis sx  reported. Hold lasix for now and instead elevated legs for swelling. Compression also discussed. Will order BMET to reassess Cr and electrolytes. BNP for volume status. Suspect diarrhea as etiology of dehydration (given skin turgor) and reported lower BP and episode of racing HR. Encouraged hydration without Gatorade. Cautioned against caffeine. Given previous reported sx during call and recent diarrhea, continue to recommend COVID-19 testing once able.  LEE --Holding lasix given suspected dehydration. Recommend leg elevation and compression at this time. Will update an echo. BMET and BNP ordered as well.  Prominent right external jugular vein --As noted at her previous visit, this is a normal finding and visible with increased intrathoracic pressure as would be seen with Valsalva. No significant pathology on physical exam and review of prior echo, as well as cervical spine MRI. No further work-up or intervention required at this time.  Historyof syncope/near syncope --No report of recent syncope. Previous event monitor showing NSR and without arrhythmia.Reports dizziness with low BP and an episode of elevated HR after frequent diarrhea and suspected dehydration. Continue to monitor BP and hydrate. Discontinue lasix for now. Repeat BMET once well.   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 Duplex study performed of RUE and without significant findings, per patient.  Disposition:RTC after echo and once well.  COVID-19 Education: The signs and symptoms of COVID-19 were discussed with the patient and how to seek care for testing (follow up with PCP or arrange E-visit). The importance of social distancing was discussed today.  Time:   Today, I have spent 45 minutes with the patient with telehealth technology discussing the above problems.      Medication Adjustments/Labs and Tests Ordered: Current medicines are reviewed at length with the patient today.  Concerns regarding medicines are outlined above.    Signed, Tremell Reimers D Genie Wenke, [place]

## 2020-03-12 ENCOUNTER — Ambulatory Visit (INDEPENDENT_AMBULATORY_CARE_PROVIDER_SITE_OTHER): Payer: Medicare Other

## 2020-03-12 ENCOUNTER — Other Ambulatory Visit: Payer: Self-pay

## 2020-03-12 ENCOUNTER — Other Ambulatory Visit
Admission: RE | Admit: 2020-03-12 | Discharge: 2020-03-12 | Disposition: A | Discharge status: Home / Self Care | Payer: Medicare Other | Attending: Physician Assistant | Admitting: Physician Assistant

## 2020-03-12 DIAGNOSIS — I5189 Other ill-defined heart diseases: Secondary | ICD-10-CM | POA: Insufficient documentation

## 2020-03-12 DIAGNOSIS — R0600 Dyspnea, unspecified: Secondary | ICD-10-CM | POA: Insufficient documentation

## 2020-03-12 DIAGNOSIS — R0789 Other chest pain: Secondary | ICD-10-CM

## 2020-03-12 LAB — ECHOCARDIOGRAM COMPLETE
AR max vel: 2.49 cm2
AV Area VTI: 2.97 cm2
AV Area mean vel: 2.51 cm2
AV Mean grad: 3 mmHg
AV Peak grad: 5.4 mmHg
Ao pk vel: 1.16 m/s
Area-P 1/2: 4.21 cm2
Calc EF: 54.9 %
S' Lateral: 3.2 cm
Single Plane A2C EF: 54.3 %
Single Plane A4C EF: 55.8 %

## 2020-03-12 LAB — BASIC METABOLIC PANEL
Anion gap: 10 (ref 5–15)
BUN: 9 mg/dL (ref 6–20)
CO2: 26 mmol/L (ref 22–32)
Calcium: 9.2 mg/dL (ref 8.9–10.3)
Chloride: 97 mmol/L — ABNORMAL LOW (ref 98–111)
Creatinine, Ser: 0.79 mg/dL (ref 0.44–1.00)
GFR, Estimated: >60 mL/min (ref >60)
Glucose, Bld: 99 mg/dL (ref 70–99)
Potassium: 5.1 mmol/L (ref 3.5–5.1)
Sodium: 133 mmol/L — ABNORMAL LOW (ref 135–145)

## 2020-03-12 LAB — BRAIN NATRIURETIC PEPTIDE: B Natriuretic Peptide: 30.1 pg/mL (ref 0.0–100.0)

## 2020-03-16 ENCOUNTER — Telehealth: Payer: Self-pay | Admitting: *Deleted

## 2020-03-16 DIAGNOSIS — E871 Hypo-osmolality and hyponatremia: Secondary | ICD-10-CM

## 2020-03-16 DIAGNOSIS — E875 Hyperkalemia: Secondary | ICD-10-CM

## 2020-03-16 NOTE — Telephone Encounter (Signed)
Spoke to pt. Notified of results and provider's recc. Pt verbalized understanding. She will have repeat Bmet tomorrow at the [facility].  Orders placed. Pt has no further questions at this time.

## 2020-03-16 NOTE — Telephone Encounter (Signed)
-----   Message from Arvil Chaco, PA-C sent at 03/12/2020  6:46 PM EST ----- Labs show that she is not holding on to any fluid. Sodium is on the low side. She should monitor for any signs of confusion. Renal function normal. Potassium is on the higher side, which could be due to pseudohyperkalemia.   At her earliest convenience, we should repeat a BMET to recheck both her potassium and sodium.

## 2020-03-22 ENCOUNTER — Telehealth: Payer: Self-pay | Admitting: Physician Assistant

## 2020-03-22 DIAGNOSIS — I5189 Other ill-defined heart diseases: Secondary | ICD-10-CM

## 2020-03-22 DIAGNOSIS — R0789 Other chest pain: Secondary | ICD-10-CM

## 2020-03-22 NOTE — Telephone Encounter (Signed)
Patient wants to have cbc added to BMET when she goes to [facility] to have pre appt labs drawn.

## 2020-03-23 ENCOUNTER — Other Ambulatory Visit
Admission: RE | Admit: 2020-03-23 | Discharge: 2020-03-23 | Disposition: A | Discharge status: Home / Self Care | Payer: Medicare Other | Attending: Physician Assistant | Admitting: Physician Assistant

## 2020-03-23 DIAGNOSIS — I5189 Other ill-defined heart diseases: Secondary | ICD-10-CM | POA: Diagnosis present

## 2020-03-23 DIAGNOSIS — E871 Hypo-osmolality and hyponatremia: Secondary | ICD-10-CM | POA: Insufficient documentation

## 2020-03-23 DIAGNOSIS — R0789 Other chest pain: Secondary | ICD-10-CM | POA: Insufficient documentation

## 2020-03-23 DIAGNOSIS — E875 Hyperkalemia: Secondary | ICD-10-CM | POA: Diagnosis present

## 2020-03-23 LAB — BASIC METABOLIC PANEL
Anion gap: 9 (ref 5–15)
BUN: <5 mg/dL — ABNORMAL LOW (ref 6–20)
CO2: 24 mmol/L (ref 22–32)
Calcium: 8.8 mg/dL — ABNORMAL LOW (ref 8.9–10.3)
Chloride: 96 mmol/L — ABNORMAL LOW (ref 98–111)
Creatinine, Ser: 0.73 mg/dL (ref 0.44–1.00)
GFR, Estimated: >60 mL/min (ref >60)
Glucose, Bld: 97 mg/dL (ref 70–99)
Potassium: 4.1 mmol/L (ref 3.5–5.1)
Sodium: 129 mmol/L — ABNORMAL LOW (ref 135–145)

## 2020-03-24 ENCOUNTER — Other Ambulatory Visit
Admission: RE | Admit: 2020-03-24 | Discharge: 2020-03-24 | Disposition: A | Discharge status: Home / Self Care | Payer: Medicare Other | Source: Ambulatory Visit | Attending: Physician Assistant | Admitting: Physician Assistant

## 2020-03-24 ENCOUNTER — Other Ambulatory Visit: Payer: Self-pay

## 2020-03-24 ENCOUNTER — Ambulatory Visit (INDEPENDENT_AMBULATORY_CARE_PROVIDER_SITE_OTHER): Payer: Medicare Other | Admitting: Physician Assistant

## 2020-03-24 ENCOUNTER — Encounter: Payer: Self-pay | Admitting: Physician Assistant

## 2020-03-24 VITALS — BP 94/66 | HR 91 | Ht 67.0 in | Wt 177.0 lb

## 2020-03-24 DIAGNOSIS — R000 Tachycardia, unspecified: Secondary | ICD-10-CM | POA: Diagnosis not present

## 2020-03-24 DIAGNOSIS — B028 Zoster with other complications: Secondary | ICD-10-CM

## 2020-03-24 DIAGNOSIS — R0789 Other chest pain: Secondary | ICD-10-CM | POA: Insufficient documentation

## 2020-03-24 DIAGNOSIS — R600 Localized edema: Secondary | ICD-10-CM

## 2020-03-24 DIAGNOSIS — Z87898 Personal history of other specified conditions: Secondary | ICD-10-CM

## 2020-03-24 DIAGNOSIS — R945 Abnormal results of liver function studies: Secondary | ICD-10-CM

## 2020-03-24 DIAGNOSIS — R197 Diarrhea, unspecified: Secondary | ICD-10-CM | POA: Diagnosis not present

## 2020-03-24 DIAGNOSIS — R002 Palpitations: Secondary | ICD-10-CM

## 2020-03-24 DIAGNOSIS — M542 Cervicalgia: Secondary | ICD-10-CM

## 2020-03-24 DIAGNOSIS — Z86718 Personal history of other venous thrombosis and embolism: Secondary | ICD-10-CM

## 2020-03-24 LAB — CBC
HCT: 34.9 % — ABNORMAL LOW (ref 36.0–46.0)
Hemoglobin: 11.6 g/dL — ABNORMAL LOW (ref 12.0–15.0)
MCH: 33.6 pg (ref 26.0–34.0)
MCHC: 33.2 g/dL (ref 30.0–36.0)
MCV: 101.2 fL — ABNORMAL HIGH (ref 80.0–100.0)
Platelets: 298 10*3/uL (ref 150–400)
RBC: 3.45 MIL/uL — ABNORMAL LOW (ref 3.87–5.11)
RDW: 13.3 % (ref 11.5–15.5)
WBC: 10.5 10*3/uL (ref 4.0–10.5)
nRBC: 0 % (ref 0.0–0.2)

## 2020-03-24 LAB — HEPATIC FUNCTION PANEL
ALT: 21 U/L (ref 0–44)
AST: 18 U/L (ref 15–41)
Albumin: 4.3 g/dL (ref 3.5–5.0)
Alkaline Phosphatase: 26 U/L — ABNORMAL LOW (ref 38–126)
Bilirubin, Direct: <0.1 mg/dL (ref 0.0–0.2)
Total Bilirubin: 0.5 mg/dL (ref 0.3–1.2)
Total Protein: 7.1 g/dL (ref 6.5–8.1)

## 2020-03-24 LAB — TSH: TSH: 2.74 u[IU]/mL (ref 0.350–4.500)

## 2020-03-24 NOTE — Patient Instructions (Signed)
Medication Instructions:  Your physician recommends that you continue on your current medications as directed. Please refer to the Current Medication list given to you today.  *If you need a refill on your cardiac medications before your next appointment, please call your pharmacy*   Lab Work: -  Your physician recommends that you have lab work TODAY at the [place]: TSH, Liver panel, urine catecholamines -  Please go to the [facility]. You will check in at the front desk to the right as you walk into the atrium.   Testing/Procedures: None ordered   Follow-Up: At [facility], you and your health needs are our priority.  As part of our continuing mission to provide you with exceptional heart care, we have created designated Provider Care Teams.  These Care Teams include your primary Cardiologist (physician) and Advanced Practice Providers (APPs -  Physician Assistants and Nurse Practitioners) who all work together to provide you with the care you need, when you need it.  We recommend signing up for the patient portal called "MyChart".  Sign up information is provided on this After Visit Summary.  MyChart is used to connect with patients for Virtual Visits (Telemedicine).  Patients are able to view lab/test results, encounter notes, upcoming appointments, etc.  Non-urgent messages can be sent to your provider as well.   To learn more about what you can do with MyChart, go to [email or web address].    Your next appointment:    Follow up as needed    Other Instructions  1) You have been referred to Gastroenterology. You should receive a call from GI to schedule this appointment. Information and phone number below.   2) We have given you information to call to establish care with PCP - see [facility] handout.

## 2020-03-24 NOTE — Telephone Encounter (Signed)
Per Elenor Quinones, PA, ok to add on CBC to lab work that was completed yesterday.  Notified pt. Lab notified and CBC has been added. Pt will be in clinic today for follow up.

## 2020-03-24 NOTE — Addendum Note (Signed)
Addended by: Darlyne Russian on: 03/24/2020 02:34 PM   Modules accepted: Orders

## 2020-03-24 NOTE — Progress Notes (Signed)
Office Visit    Patient Name: April [name] Date of Encounter: 03/24/2020  Primary Care Provider:  Jodi Marble, MD Primary Cardiologist:  [name], MD  Chief Complaint    Chief Complaint  Patient presents with   Follow-up    After testing--echo    50 y.o. 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   Past Medical History    Past Medical History:  Diagnosis Date   Anxiety 12/14/2011   Asthma    no inhaler   Chest pain    a. 12/2015 CTA chest: no PE. No significant coronary Ca2+. Mosaic attenuation pattern in both lungs, may reflect small airway dzs; c. 12/2015 Ex MV: Walked 6 mins. No ischemia/scar.   Diastolic dysfunction    a. 10/2015 Echo: EF nl, mod diast dysfxn. Mild MR/TR; b. 09/2016 Echo: EF 55-60%, no rwma. Nl RV size and fxn.   Factor 5 Leiden mutation, heterozygous ([place])    Fibromyalgia    Fracture of 5th metatarsal 03/2011   left foot -     Heart palpitations    a. Notes intermittent tachycardia (sinus). Did not tolerate beta blockers 2/2 urinary incontinence.   Interstitial cystitis    Ovarian cyst    Pancreatitis    Pelvic floor dysfunction    Rheumatoid arteritis (HCC)    Sinus infection    chronic   Syncope    a. 08/2017 Event monitor: Average heart rate 77 (49-136).  Periods of sinus arrhythmia noted.  Triggered events corresponded with sinus rhythm, sinus arrhythmia, and artifact.  No significant ectopy, sustained arrhythmias, or prolonged pauses observed.   Thrombosis    a. 12/14/2011 Thrombosis of R gonadal vein with extension of thrombus into IVC to level of R renal vein per CT 12/11/12 of [place].   Past Surgical History:  Procedure Laterality Date    ABDOMINAL HYSTERECTOMY     CHOLECYSTECTOMY     CYSTOSCOPY WITH HYDRODISTENSION AND BIOPSY  04/21/2011   Dr Philis Fendt   LAPAROSCOPIC ASSISTED VAGINAL HYSTERECTOMY  08/21/2011   Procedure: LAPAROSCOPIC ASSISTED VAGINAL HYSTERECTOMY;  Surgeon: Cyril Mourning, MD;  Location: [facility];  Service: Gynecology;  Laterality: N/A;  OPEN LAPAROSCOPIC   LAPAROSCOPIC NISSEN FUNDOPLICATION  3664   neck ablation Left 12/29/2019   neck ablation Right 01/05/2020   SALPINGOOPHORECTOMY  08/21/2011   Procedure: SALPINGO OOPHERECTOMY;  Surgeon: Cyril Mourning, MD;  Location: [facility];  Service: Gynecology;  Laterality: Bilateral;   TUBAL LIGATION  2010   WISDOM TOOTH EXTRACTION      Allergies  Allergies  Allergen Reactions   Amitriptyline Other (See Comments)    Other reaction(s): Other Change in mental status   Amoxicillin Rash   Bromelains     Other reaction(s): Abdominal pain   Celexa [Citalopram Hydrobromide] Other (See Comments)    Other reaction(s): Other Change in mental status   Ivp Dye [Iodinated Diagnostic Agents] Anaphylaxis    Pt reports "I do not have contrast allergy and have received IVP dye without pre-med with NO reaction."    Nortriptyline     Other reaction(s): Other   Penicillins Swelling and Rash   Pregabalin     Other reaction(s): Myalgias (muscle pain)   Bromelains [Pineapple Extract] Other (See Comments)    Severe pain  Budesonide-Formoterol Fumarate     Other reaction(s): Other   Citalopram Other (See Comments)   Mangifera Indica Swelling   Methylene Blue Other (See Comments)    Caused inflamed pancreas, per pt should not use   Sertraline Other (See Comments)    Other reaction(s): Other Suicidal thoughts   Sulfa Antibiotics Other (See Comments)   Sulfonamide Derivatives Other (See Comments)    excrutiating pain, patient states it caused her to have osteoarthritis   Bromaline Lorretta Harp Bromm] Rash   Other Rash    Mango     History of Present Illness    April [name] is a 50 y.o. 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  2017 TTE showed normal LVSF with mild MR/TR.  Stress test 2017 without ischemia. 2018 TTE EF 55-60%.  2019 monitor showed predominantly NSR.  In 2019,she presented with continued c/o intermittent palpitations. No further testing or medication changes were recommended at that time. In the past, she has felt herpalpitations were connected to her bladder spasms and significantly improved by her bladder stimulator. She reported walking daily on her parent's farm without any limitations.   InSeptember 2020, she started living with her Mom to avoid exposure to COVID-19. She noticed SOB/DOE that was not alleviated with her usual inhalers. She also noted distended right jugular vein that became worse with talking or laughing. She continued to take lasix 20mg  dail and diltiazem. She noted weight gain,attributed that to her mother's cooking / caloric intake.  She underwentduplex ultrasoundper PCP andperformed of the RUE without significant findings.  Echo 1/2021showed EF 55 to 60%, borderline LVH, NR WMA.  She was seen by her primary cardiologist, Dr.End,06/18/2019 and had multiple complaints. She had recently had a tooth extracted. She continued to notice the prominence of her right neck vein. She experience intermittent pain attributed to muscle, bone spur, and shingles. She reported ongoing tachypalpitations, lasting less than 10 minutes. She felt as if she was not urinating as much with intermittent edema noted in her legs. No medication changes or further work-up were recommended.  On 12/09/2019, she returned to  clinic and noted ongoing severe pain associated with her right neck muscle (being evaluated at [facility]). She had nerve ablations via [facility], resulting in numbness in her scalp. She had ongoing pain from bone spurs. She reported chest discomfort, tachypalpitations with HR over 200bpm for 4hrs, and elevated BP.  She had intermittent edema and was sleeping in a recliner 2/2 pain. She had DOE and poor exercise tolerance.   Cardiac CT was ordered with CAC score of 0 and possible PFO. Discussed with structural heart team with no further workup recommended at this time.   Seen via telemedicine 12/2019 and s/p recent surgery with SOB using a spirometer.  She noted stress relief after learning results of cardiac CT and also knowing she has her PRN Cardizem. She hoped to start walking and was doing elongating stretches. She reported other lifestyle and diet changes with successful wt loss and that included going to bed between 10PM and 2AM.   She called the clinic 1/21 with report of anxiety, dizziness, nausea, and decreased energy. She was using Cardizem for breakthrough tachypalpitations with success. She had a temp of 103 on 1/11. She was advised to call  her PCP and COVID19 test with pt report she needed a new PCP.   Seen via telemedicine and dehydrated.  She reported frequent diarrhea.  She also reported multiple other symptoms.  Recommendation was to hold Lasix.  In addition, she reported drinking large amounts of Gatorade with lower extremity edema, despite her dehydration.  Echo ordered and showing stable EF and NR WMA.  Advised to discontinue Gatorade and any additional supplement powder.  Labs collected with hyperkalemia noted and repeat labs showing potassium WNL.  Also noted on labs was anemia, reduced BUN.  Today, 03/24/2020, she returns to clinic and notes ongoing symptoms.  She feels as if she is holding onto fluid.  She has continued to take her Lasix, even since her previous  telemedicine visit.  She reports abdominal pain and ongoing diarrhea/GI issues.  She reports her neck pain has returned.  She is feels as if she has shingles.  She wonders if this is contributing to her issues.  She reports back pain.  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  She reports that she is drinking approximately 612 to 16 ounce bottles of water per day.  She has stopped drinking Gatorade and any type of electrolyte powders.  She has been watching her salt but sometimes will eat salt when dizzy with improvement in symptoms.  She has not tried elevation or compression for her lower extremity edema.  She denies any chest pain.  She reports ongoing intermittent tachypalpitations, relieved by her as needed Cardizem.  No syncope or loss of consciousness.  No recent falls.  She reports she is increasingly sedentary and does not leave her wheelchair.  She feels weak and fatigued.  She has noted pink urine.  She denies any dark or tarry stools.  No hemoptysis or hematochezia.  She wonders if she has COPD versus an iodine disorder. Labs reviewed in detail, as well as her echo.  She reports ongoing diarrhea  Home Medications    Current Outpatient Medications on File Prior to Visit  Medication Sig Dispense Refill   albuterol (PROVENTIL HFA;VENTOLIN HFA) 108 (90 BASE) MCG/ACT inhaler Inhale 1 puff into the lungs as needed. For shortness of breath or wheezing     Ascorbic Acid (VITA-C PO) Take by mouth.     azelastine (ASTELIN) 0.1 % nasal spray as directed.     Biotin 10000 MCG TABS Take by mouth daily.     Calcium-Phosphorus-Vitamin D (CALCIUM GUMMIES PO) Take 1 tablet by mouth every evening.     Cholecalciferol (VITAMIN D3) 5000 units TABS Take by mouth daily.     co-enzyme Q-10 30 MG capsule Take 30 mg by mouth daily.     Cyanocobalamin 5000 MCG SUBL Place 5,000 mcg under the tongue daily.       diazepam (VALIUM) 10 MG tablet Take 10 mg by mouth daily.     dicyclomine (BENTYL) 20 MG tablet Take 20 mg by mouth every 6 (six) hours.     diltiazem (CARDIZEM CD) 120 MG 24 hr capsule TAKE 1 CAPSULE BY MOUTH ONCE DAILY 90 capsule 0   diltiazem (CARDIZEM) 30 MG tablet Take 1 tablet (30 mg total) by mouth every 8 (eight) hours as needed (for breakthrough palpitations). 30 tablet 1   diphenhydrAMINE (BENADRYL) 50 MG tablet Please take 1 tablet (50mg ) 1 hr prior to scan for contrast allergy 1 tablet 0   ELMIRON 100 MG capsule  Take 200 mg by mouth 2 (two) times daily.  0   fexofenadine (ALLEGRA) 180 MG tablet Take 180 mg by mouth every morning.     fluticasone (FLONASE) 50 MCG/ACT nasal spray SHAKE LQ AND U 1 TO 2 SPRAYS IEN QD UTD  3   Fluticasone-Salmeterol (ADVAIR) 250-50 MCG/DOSE AEPB Inhale 1 puff into the lungs 2 (two) times a day.      gabapentin (NEURONTIN) 800 MG tablet Take 800 mg by mouth 3 (three) times daily.     guaiFENesin (MUCINEX) 600 MG 12 hr tablet Take 1,200 mg by mouth 2 (two) times daily.     HYDROcodone-acetaminophen (NORCO/VICODIN) 5-325 MG tablet Take 1 tablet by mouth 4 (four) times daily as needed. for pain     hydrOXYzine (ATARAX/VISTARIL) 50 MG tablet Take 50 mg by mouth at bedtime.     hyoscyamine (LEVSIN SL) 0.125 MG SL tablet Place 0.125 mg under the tongue every 4 (four) hours as needed for cramping.     Lactobacillus (ACIDOPHILUS PROBIOTIC PO) Take by mouth 3 (three) times daily.     lidocaine (XYLOCAINE) 5 % ointment Apply topically daily.     methocarbamol (ROBAXIN) 750 MG tablet Take 750 mg by mouth every 8 (eight) hours.     montelukast (SINGULAIR) 10 MG tablet Take 10 mg by mouth at bedtime.     nitrofurantoin (MACRODANTIN) 50 MG capsule Take 50 mg by mouth at bedtime.     pantoprazole (PROTONIX) 40 MG tablet Take 40 mg by mouth every morning.     PROCTOZONE-HC 2.5 % rectal cream Apply topically as needed.  3   progesterone  (PROMETRIUM) 100 MG capsule Take 100 mg by mouth daily.      Riboflavin (B2) 100 MG TABS Take by mouth daily.     Simethicone 125 MG CAPS Take 125 mg by mouth 4 (four) times daily.     TURMERIC PO Take by mouth daily.     valACYclovir (VALTREX) 500 MG tablet Take 500 mg by mouth 2 (two) times daily.     VALACYCLOVIR HCL PO Take 1,000 mg by mouth daily.   2   vitamin k 100 MCG tablet Take 100 mcg by mouth daily.     Zinc 25 MG TABS Take by mouth daily.     Current Facility-Administered Medications on File Prior to Visit  Medication Dose Route Frequency Provider Last Rate Last Admin   betamethasone acetate-betamethasone sodium phosphate (CELESTONE) injection 3 mg  3 mg Intramuscular Once Edrick Kins, DPM        Review of Systems    Ports palpitations, abdominal pain, diarrhea, pink urine, weakness, fatigue, sedentary lifestyle due to weakness and fatigue, edema, fluid retention.  She denies chest pain, pnd, orthopnea, n, v, dizziness, syncope,or early satiety.    All other systems reviewed and are otherwise negative except as noted above.  Physical Exam    VS:  BP 94/66    Pulse 91    Ht 5\' 7"  (1.702 m)    Wt 177 lb (80.3 kg)    LMP 05/20/2011    BMI 27.72 kg/m  , BMI Body mass index is 27.72 kg/m. GEN: Well nourished, well developed, in no acute distress. Joined by her daughter. HEENT: normal. Neck: Supple, no JVD, carotid bruits, or masses. Cardiac: RRR, no murmurs, rubs, or gallops. No clubbing, cyanosis, edema.  Radials/DP/PT 2+ and equal bilaterally.  Respiratory:  Respirations regular and unlabored, clear to auscultation bilaterally. GI: Soft, nontender, nondistended, BS + x  4. MS: no deformity or atrophy. Skin: warm and dry, no rash. Neuro:  Strength and sensation are intact. Psych: Normal affect.  Accessory Clinical Findings    ECG personally reviewed by me today - NSR, 91bpm  - no acute changes.  VITALS Reviewed today   Temp Readings from Last 3 Encounters:   01/09/19 98.4 F (36.9 C)  07/13/16 98.7 F (37.1 C) (Oral)  12/19/15 98.5 F (36.9 C) (Oral)   BP Readings from Last 3 Encounters:  03/24/20 94/66  03/05/20 96/62  01/09/20 (!) 143/92   Pulse Readings from Last 3 Encounters:  03/24/20 91  03/05/20 80  01/09/20 77    Wt Readings from Last 3 Encounters:  03/24/20 177 lb (80.3 kg)  03/05/20 159 lb (72.1 kg)  01/09/20 159 lb (72.1 kg)     LABS  reviewed today   Lab Results  Component Value Date   WBC 10.5 03/23/2020   HGB 11.6 (L) 03/23/2020   HCT 34.9 (L) 03/23/2020   MCV 101.2 (H) 03/23/2020   PLT 298 03/23/2020   Lab Results  Component Value Date   CREATININE 0.73 03/23/2020   BUN <5 (L) 03/23/2020   NA 129 (L) 03/23/2020   K 4.1 03/23/2020   CL 96 (L) 03/23/2020   CO2 24 03/23/2020   Lab Results  Component Value Date   ALT 35 (H) 07/13/2016   AST 18 07/13/2016   ALKPHOS 40 07/13/2016   BILITOT <0.2 07/13/2016   No results found for: CHOL, HDL, LDLCALC, LDLDIRECT, TRIG, CHOLHDL  No results found for: HGBA1C No results found for: TSH   STUDIES/PROCEDURES reviewed today   Echo 03/2020 1. Left ventricular ejection fraction, by estimation, is 60 to 65%. The  left ventricle has normal function. The left ventricle has no regional  wall motion abnormalities. Left ventricular diastolic parameters were  normal.  2. Right ventricular systolic function is normal. The right ventricular  size is normal.  3. The inferior vena cava is dilated in size with >50% respiratory  variability, suggesting right atrial pressure of 8 mmHg.  The following studies were reviewed today:   30-day event monitor (09/20/2017): Predominantly sinus rhythm without significant arrhythmia.  TTE (10/16/16): Normal LV size and function. LVEF 55-60% with normal wall motion and diastolic parameters. Normal RV size and function.  Exercise myocardial perfusion stress test (12/16/15): Normal study. No evidence of ischemia or scar.  Patient exercised 6 minutes achieving 7 METs with peak heart rate of 151 bpm (85% MPHR).  CTA chest (12/06/15): No pulmonary embolism. No significant coronary artery calcification. Mosaic attenuation pattern in both lungs that may reflect small airway disease.  Transthoracic echocardiogram (10/06/15, [place]): Normal LV function with moderate diastolic dysfunction. Mild mitral and tricuspid regurgitation.  EF 55 to 60%, borderline LVH, NR WMA.  CT cors IMPRESSION: 1. No evidence of CAD, CADRADS = 0. 2. Coronary calcium score of 0. This was 0 percentile for age and sex matched control. 3. Normal coronary origin with left dominance.  Assessment & Plan    Abnormal labs Anemia Electrolyte abnormalities Diarrhea, dehydration --Anemia noted with history of abnormal LFTs and electrolyte abnormalities. Ongoing diarrhea and pink urine reported. Denies UTI with report that she had a recent UA.  Will order LFTs. Refer to GI. Establish with PCP. Defer to PCP for further workup.  Palpitations, Racing HR -Occasional breakthrough tachypalpitations.Continue diltiazem120mg  daily and PRN short acting diltiazem 30 mg every 8 hours for breakthrough tachypalpitations. Increase activity as tolerated.   ?  Self reported adrenal tumor --Reports history of pheochromocytoma; however, I am unable to find this on review of her EMR.  Given her report of previous collection of urine and as she was told she had a pheochromocytoma/adrenal tumor in the past, will repeat this work-up to ensure negative and then defer to PCP as above.  Atypical CP --No further CP. Reassured by her cardiac CT with CAC score 0. Echo with EF nl and NRWMA.  LEE --Continued her lasix since her previous recommendation to hold. OK to continue given recent labs. Elevation and compression discussed..  Prominent right external jugular vein --As noted at her previous visit, this is a normal finding and visible with  increased intrathoracic pressure as would be seen with Valsalva. No significant pathology on physical exam and review of prior echo, as well as cervical spine MRI. No further work-up or intervention required at this time.  Historyof syncope/near syncope --No report of recent syncope or near syncope with previous event monitor showing NSR and without arrhythmia.No further workup at this time.  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 Duplex study performed of RUE and without significant findings, per patient. Could consider repeat hematology referral per PCP.  Disposition: RTC as needed after GI referral and establishing with PCP   Arvil Chaco, PA-C 03/24/2020

## 2020-03-24 NOTE — Telephone Encounter (Signed)
Patient calling back to check status of add on order for cbc.  Patient states [facility] drew extra tubes for cbc.  Please call lab with order if able so they can add on .

## 2020-03-25 ENCOUNTER — Other Ambulatory Visit
Admission: RE | Admit: 2020-03-25 | Discharge: 2020-03-25 | Disposition: A | Discharge status: Home / Self Care | Payer: Medicare Other | Source: Ambulatory Visit | Attending: Physician Assistant | Admitting: Physician Assistant

## 2020-03-25 DIAGNOSIS — R000 Tachycardia, unspecified: Secondary | ICD-10-CM | POA: Diagnosis present

## 2020-03-25 NOTE — Addendum Note (Signed)
Addended by: Ernie Hew D on: 03/25/2020 12:39 PM   Modules accepted: Orders

## 2020-03-31 LAB — METANEPHRINES, URINE, 24 HOUR
Metaneph Total, Ur: 23 ug/L
Metanephrines, 24H Ur: 56 ug/24 hr (ref 36–209)
Normetanephrine, 24H Ur: 213 ug/24 hr (ref 131–612)
Normetanephrine, Ur: 87 ug/L

## 2020-04-01 LAB — CATECHOLAMINES,UR.,FREE,24 HR
Dopamine, Rand Ur: 67 ug/L
Dopamine, Ur, 24Hr: 164 ug/24 hr (ref 0–510)
Epinephrine, Rand Ur: <1 ug/L
Epinephrine, U, 24Hr: <2 ug/24 hr (ref 0–20)
Norepinephrine, Rand Ur: 13 ug/L
Norepinephrine,U,24H: 32 ug/24 hr (ref 0–135)

## 2020-04-05 ENCOUNTER — Other Ambulatory Visit: Payer: Self-pay | Admitting: Internal Medicine

## 2020-04-27 ENCOUNTER — Telehealth: Payer: Self-pay | Admitting: *Deleted

## 2020-04-27 DIAGNOSIS — Z006 Encounter for examination for normal comparison and control in clinical research program: Secondary | ICD-10-CM

## 2020-04-27 NOTE — Telephone Encounter (Signed)
I called patient for 90-day Identify Study phone call. Patient is feeling better and cardiac symptoms have improved. I will follow-up in Nov. 2022 for 1 year follow-up.

## 2020-05-04 ENCOUNTER — Inpatient Hospital Stay: Payer: Medicare Other | Admitting: Oncology

## 2020-05-04 ENCOUNTER — Inpatient Hospital Stay: Payer: Medicare Other

## 2020-05-07 ENCOUNTER — Telehealth: Payer: Self-pay

## 2020-05-07 NOTE — Telephone Encounter (Signed)
Patient is scheduled as NP with Dr. Tasia Catchings on 05/12/20 for Factor V Leiden.  She has been seen by Dr. Bynum Bellows at [facility] Ph:  [phone]/FQM: [phone].  She is unsure how long it has been since she was seen at that practice.  [facility] and requested a return call to request records they may have available.

## 2020-05-11 NOTE — Telephone Encounter (Signed)
Records from [facility] scanned in media tab.

## 2020-05-12 ENCOUNTER — Encounter: Payer: Self-pay | Admitting: Oncology

## 2020-05-12 ENCOUNTER — Inpatient Hospital Stay: Payer: Medicare Other

## 2020-05-12 ENCOUNTER — Inpatient Hospital Stay: Payer: Medicare Other | Attending: Oncology | Admitting: Oncology

## 2020-05-12 VITALS — BP 105/74 | HR 73 | Temp 98.3°F | Resp 18 | Wt 175.8 lb

## 2020-05-12 DIAGNOSIS — J45909 Unspecified asthma, uncomplicated: Secondary | ICD-10-CM | POA: Insufficient documentation

## 2020-05-12 DIAGNOSIS — M069 Rheumatoid arthritis, unspecified: Secondary | ICD-10-CM | POA: Diagnosis not present

## 2020-05-12 DIAGNOSIS — D6851 Activated protein C resistance: Secondary | ICD-10-CM

## 2020-05-12 DIAGNOSIS — Z79899 Other long term (current) drug therapy: Secondary | ICD-10-CM | POA: Diagnosis not present

## 2020-05-12 DIAGNOSIS — Z7901 Long term (current) use of anticoagulants: Secondary | ICD-10-CM | POA: Diagnosis not present

## 2020-05-12 DIAGNOSIS — Z86718 Personal history of other venous thrombosis and embolism: Secondary | ICD-10-CM | POA: Insufficient documentation

## 2020-05-12 DIAGNOSIS — D509 Iron deficiency anemia, unspecified: Secondary | ICD-10-CM | POA: Diagnosis not present

## 2020-05-12 DIAGNOSIS — F419 Anxiety disorder, unspecified: Secondary | ICD-10-CM | POA: Diagnosis not present

## 2020-05-12 DIAGNOSIS — M797 Fibromyalgia: Secondary | ICD-10-CM | POA: Diagnosis not present

## 2020-05-12 DIAGNOSIS — Z8719 Personal history of other diseases of the digestive system: Secondary | ICD-10-CM | POA: Insufficient documentation

## 2020-05-12 DIAGNOSIS — N3010 Interstitial cystitis (chronic) without hematuria: Secondary | ICD-10-CM | POA: Diagnosis not present

## 2020-05-12 LAB — CBC WITH DIFFERENTIAL/PLATELET
Abs Immature Granulocytes: 0.02 10*3/uL (ref 0.00–0.07)
Basophils Absolute: 0.1 10*3/uL (ref 0.0–0.1)
Basophils Relative: 1 %
Eosinophils Absolute: 0.2 10*3/uL (ref 0.0–0.5)
Eosinophils Relative: 2 %
HCT: 40.4 % (ref 36.0–46.0)
Hemoglobin: 13.8 g/dL (ref 12.0–15.0)
Immature Granulocytes: 0 %
Lymphocytes Relative: 34 %
Lymphs Abs: 2.7 10*3/uL (ref 0.7–4.0)
MCH: 33.5 pg (ref 26.0–34.0)
MCHC: 34.2 g/dL (ref 30.0–36.0)
MCV: 98.1 fL (ref 80.0–100.0)
Monocytes Absolute: 0.8 10*3/uL (ref 0.1–1.0)
Monocytes Relative: 10 %
Neutro Abs: 4.3 10*3/uL (ref 1.7–7.7)
Neutrophils Relative %: 53 %
Platelets: 288 10*3/uL (ref 150–400)
RBC: 4.12 MIL/uL (ref 3.87–5.11)
RDW: 13 % (ref 11.5–15.5)
WBC: 8 10*3/uL (ref 4.0–10.5)
nRBC: 0 % (ref 0.0–0.2)

## 2020-05-12 LAB — HEPATIC FUNCTION PANEL
ALT: 14 U/L (ref 0–44)
AST: 20 U/L (ref 15–41)
Albumin: 5 g/dL (ref 3.5–5.0)
Alkaline Phosphatase: 26 U/L — ABNORMAL LOW (ref 38–126)
Bilirubin, Direct: <0.1 mg/dL (ref 0.0–0.2)
Total Bilirubin: 0.5 mg/dL (ref 0.3–1.2)
Total Protein: 8.2 g/dL — ABNORMAL HIGH (ref 6.5–8.1)

## 2020-05-12 LAB — IRON AND TIBC
Iron: 89 ug/dL (ref 28–170)
Saturation Ratios: 18 % (ref 10.4–31.8)
TIBC: 505 ug/dL — ABNORMAL HIGH (ref 250–450)
UIBC: 416 ug/dL

## 2020-05-12 LAB — RETIC PANEL
Immature Retic Fract: 7.9 % (ref 2.3–15.9)
RBC.: 4.15 MIL/uL (ref 3.87–5.11)
Retic Count, Absolute: 58.1 10*3/uL (ref 19.0–186.0)
Retic Ct Pct: 1.4 % (ref 0.4–3.1)
Reticulocyte Hemoglobin: 34.8 pg (ref >27.9)

## 2020-05-12 LAB — LACTATE DEHYDROGENASE: LDH: 116 U/L (ref 98–192)

## 2020-05-12 LAB — TECHNOLOGIST SMEAR REVIEW
Plt Morphology: NORMAL
RBC Morphology: NORMAL
WBC Morphology: NORMAL

## 2020-05-12 LAB — FERRITIN: Ferritin: 14 ng/mL (ref 11–307)

## 2020-05-12 LAB — FOLATE: Folate: 24 ng/mL (ref >5.9)

## 2020-05-12 LAB — VITAMIN B12: Vitamin B-12: 2559 pg/mL — ABNORMAL HIGH (ref 180–914)

## 2020-05-12 NOTE — Progress Notes (Signed)
Hematology/Oncology Consult note [facility] [phone] [phone]   Patient Care Team: Jodi Marble, MD as PCP - General (Internal Medicine) End, Harrell Gave, MD as PCP - Cardiology (Cardiology)  REFERRING PROVIDER: Jodi Marble, MD  CHIEF COMPLAINTS/REASON FOR VISIT:  Evaluation of factor V Leiden mutation.  HISTORY OF PRESENTING ILLNESS:   April [name] is a  50 y.o.  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 Etheleen Nicks mehta.-Last seen 07/03/2016 Detailed medical records review was performed by me. 2010, patient had work-up due to her father was diagnosed with factor V Leiden mutation and has blood clots.  Patient had hypercoagulable work-up which showed heterozygous factor V Leiden mutation, negative prothrombin gene mutation  2013, patient developed right gonadal vein thrombosis with extension into IVC/right renal vein after hysterectomy.  Patient was started on Xarelto since then.  She establish care with Dr.Amit who recommended patient to come off Xarelto on 01/18/2015.  She was also tested for antiphospholipid panel after being off Xarelto and panel was negative. Patient denies any additional episodes of thrombosis.  She also has chronic iron deficiency anemia.  Her last EGD and colonoscopy in October 2016 were reported to be negative. Patient has had work-up for anemia including negative LDH, haptoglobin, appropriate erythropoietin level and reticulocyte count, celiac disease panel negative-January 2017, H2 breathe test  by GI negative per patient, Patient has tried oral iron supplementation which caused her to have diarrhea. Chronic aches and pains.  History of fibromyalgia.  Review of Systems  Constitutional: Positive for fatigue. Negative for appetite change, chills and fever.  HENT:   Negative for hearing loss and voice change.   Eyes: Negative for eye problems.  Respiratory: Negative for chest tightness and cough.   Cardiovascular: Negative for chest pain.  Gastrointestinal: Negative for abdominal distention, abdominal pain and blood in stool.  Endocrine: Negative for hot flashes.  Genitourinary: Negative for difficulty urinating and frequency.   Musculoskeletal: Negative for arthralgias.  Skin: Negative for itching and rash.  Neurological: Negative for extremity weakness.  Hematological: Negative for adenopathy.  Psychiatric/Behavioral: Negative for confusion.    MEDICAL HISTORY:  Past Medical History:  Diagnosis Date  . Anxiety 12/14/2011  . Asthma    no inhaler  . Chest pain    a. 12/2015 CTA chest: no PE. No significant coronary Ca2+. Mosaic attenuation pattern in both lungs, may reflect small airway dzs; c. 12/2015 Ex MV: Walked 6 mins. No ischemia/scar.  . Diastolic dysfunction    a. 10/2015 Echo: EF nl, mod diast dysfxn. Mild MR/TR; b. 09/2016 Echo: EF 55-60%, no rwma. Nl RV size and fxn.  . Factor 5 Leiden mutation, heterozygous ([place])   . Fibromyalgia   . Fracture of 5th metatarsal 03/2011   left foot -    . Heart palpitations    a. Notes intermittent tachycardia (sinus). Did not tolerate beta blockers 2/2 urinary incontinence.  . Interstitial cystitis   . Ovarian cyst   . Pancreatitis   . Pelvic floor dysfunction   . Rheumatoid arteritis ([place])   . Sinus infection    chronic  . Syncope    a. 08/2017  Event monitor: Average heart rate 77 (49-136).  Periods of sinus arrhythmia noted.  Triggered events corresponded with sinus rhythm, sinus arrhythmia, and artifact.  No significant  ectopy, sustained arrhythmias, or prolonged pauses observed.  . Thrombosis    a. 12/14/2011 Thrombosis of R gonadal vein with extension of thrombus into IVC to level of R renal vein per CT 12/11/12 of [place].    SURGICAL HISTORY: Past Surgical History:  Procedure Laterality Date  . ABDOMINAL HYSTERECTOMY    . CHOLECYSTECTOMY    . CYSTOSCOPY WITH HYDRODISTENSION AND BIOPSY  04/21/2011   Dr Philis Fendt  . LAPAROSCOPIC ASSISTED VAGINAL HYSTERECTOMY  08/21/2011   Procedure: LAPAROSCOPIC ASSISTED VAGINAL HYSTERECTOMY;  Surgeon: Cyril Mourning, MD;  Location: [facility];  Service: Gynecology;  Laterality: N/A;  OPEN LAPAROSCOPIC  . LAPAROSCOPIC NISSEN FUNDOPLICATION  1610  . neck ablation Left 12/29/2019  . neck ablation Right 01/05/2020  . SALPINGOOPHORECTOMY  08/21/2011   Procedure: SALPINGO OOPHERECTOMY;  Surgeon: Cyril Mourning, MD;  Location: [facility];  Service: Gynecology;  Laterality: Bilateral;  . TUBAL LIGATION  2010  . WISDOM TOOTH EXTRACTION      SOCIAL HISTORY: Social History   Socioeconomic History  . Marital status: Married    Spouse name: Not on file  . Number of children: Not on file  . Years of education: Not on file  . Highest education level: Not on file  Occupational History  . Not on file  Tobacco Use  . Smoking status: Never Smoker  . Smokeless tobacco: Never Used  Vaping Use  . Vaping Use: Never used  Substance and Sexual Activity  . Alcohol use: No  . Drug use: No  . Sexual activity: Yes    Birth control/protection: Surgical  Other Topics Concern  . Not on file  Social History Narrative  . Not on file   Social Determinants of Health   Financial Resource Strain: Not on file  Food Insecurity: Not on file  Transportation Needs: Not on file  Physical Activity: Not on file  Stress: Not on file  Social Connections: Not on file  Intimate Partner Violence: Not on file    FAMILY HISTORY: Family History  Problem Relation Age of Onset  . Heart  Problems Mother   . Vascular Disease Mother   . Skin cancer Mother   . Vascular Disease Maternal Grandmother   . Kidney Stones Father   . Skin cancer Maternal Uncle   . Anesthesia problems Neg Hx     ALLERGIES:  is allergic to amitriptyline, amoxicillin, bromelains, celexa [citalopram hydrobromide], ivp dye [iodinated diagnostic agents], nortriptyline, penicillins, pregabalin, bromelains [pineapple extract], budesonide-formoterol fumarate, citalopram, mangifera indica, methylene blue, sertraline, sulfa antibiotics, sulfonamide derivatives, bromaline [albertsons di bromm], and other.  MEDICATIONS:  Current Outpatient Medications  Medication Sig Dispense Refill  . albuterol (PROVENTIL HFA;VENTOLIN HFA) 108 (90 BASE) MCG/ACT inhaler Inhale 1 puff into the lungs as needed. For shortness of breath or wheezing    . Ascorbic Acid (VITA-C PO) Take by mouth.    [name] azelastine (ASTELIN) 0.1 % nasal spray as directed.    . Biotin 10000 MCG TABS Take by mouth daily.    . Calcium-Phosphorus-Vitamin D (CALCIUM GUMMIES PO) Take 1 tablet by mouth every evening.    . Cholecalciferol (VITAMIN D3) 5000 units TABS Take by mouth daily.    [name] co-enzyme Q-10 30 MG capsule Take 30 mg by mouth daily.    . Cyanocobalamin 5000 MCG SUBL Place 5,000 mcg under the tongue  daily.     . diazepam (VALIUM) 10 MG tablet Take 10 mg by mouth daily.    [name] dicyclomine (BENTYL) 20 MG tablet Take 20 mg by mouth every 6 (six) hours.    [name] diltiazem (CARDIZEM CD) 120 MG 24 hr capsule TAKE 1 CAPSULE BY MOUTH ONCE DAILY 90 capsule 0  . diltiazem (CARDIZEM) 30 MG tablet Take 1 tablet (30 mg total) by mouth every 8 (eight) hours as needed (for breakthrough palpitations). 30 tablet 1  . ELMIRON 100 MG capsule Take 200 mg by mouth 2 (two) times daily.  0  . fexofenadine (ALLEGRA) 180 MG tablet Take 180 mg by mouth every morning.    . fluticasone (FLONASE) 50 MCG/ACT nasal spray SHAKE LQ AND U 1 TO 2 SPRAYS IEN QD UTD  3  .  Fluticasone-Salmeterol (ADVAIR) 250-50 MCG/DOSE AEPB Inhale 1 puff into the lungs 2 (two) times a day.     . gabapentin (NEURONTIN) 800 MG tablet Take 800 mg by mouth 3 (three) times daily.    [name] guaiFENesin (MUCINEX) 600 MG 12 hr tablet Take 1,200 mg by mouth 2 (two) times daily.    [name] HYDROcodone-acetaminophen (NORCO/VICODIN) 5-325 MG tablet Take 1 tablet by mouth 4 (four) times daily as needed. for pain    . hydrOXYzine (ATARAX/VISTARIL) 50 MG tablet Take 50 mg by mouth at bedtime.    . hyoscyamine (LEVSIN SL) 0.125 MG SL tablet Place 0.125 mg under the tongue every 4 (four) hours as needed for cramping.    . Lactobacillus (ACIDOPHILUS PROBIOTIC PO) Take by mouth 3 (three) times daily.    [name] lidocaine (XYLOCAINE) 5 % ointment Apply topically daily.    . methocarbamol (ROBAXIN) 750 MG tablet Take 750 mg by mouth every 8 (eight) hours.    . montelukast (SINGULAIR) 10 MG tablet Take 10 mg by mouth at bedtime.    . nitrofurantoin (MACRODANTIN) 50 MG capsule Take 50 mg by mouth at bedtime.    . pantoprazole (PROTONIX) 40 MG tablet Take 40 mg by mouth every morning.    [name] PROCTOZONE-HC 2.5 % rectal cream Apply topically as needed.  3  . progesterone (PROMETRIUM) 100 MG capsule Take 100 mg by mouth daily.     . Riboflavin (B2) 100 MG TABS Take by mouth daily.    . Simethicone 125 MG CAPS Take 125 mg by mouth 4 (four) times daily.    . TURMERIC PO Take by mouth daily.    . valACYclovir (VALTREX) 500 MG tablet Take 500 mg by mouth 2 (two) times daily.    [name] VALACYCLOVIR HCL PO Take 1,000 mg by mouth daily.   2  . vitamin k 100 MCG tablet Take 100 mcg by mouth daily.    . Zinc 25 MG TABS Take by mouth daily.    . fluconazole (DIFLUCAN) 100 MG tablet Take 100 mg by mouth daily.    . furosemide (LASIX) 20 MG tablet Take 20 mg by mouth every morning.     Current Facility-Administered Medications  Medication Dose Route Frequency Provider Last Rate Last Admin  . betamethasone acetate-betamethasone sodium  phosphate (CELESTONE) injection 3 mg  3 mg Intramuscular Once Edrick Kins, DPM         PHYSICAL EXAMINATION: ECOG PERFORMANCE STATUS: 1 - Symptomatic but completely ambulatory Vitals:   05/12/20 1136  BP: 105/74  Pulse: 73  Resp: 18  Temp: 98.3 F (36.8 C)   Filed Weights   05/12/20 1136  Weight: 175 lb 12.8  oz (79.7 kg)    Physical Exam Constitutional:      General: She is not in acute distress. HENT:     Head: Normocephalic and atraumatic.  Eyes:     General: No scleral icterus. Cardiovascular:     Rate and Rhythm: Normal rate and regular rhythm.     Heart sounds: Normal heart sounds.  Pulmonary:     Effort: Pulmonary effort is normal. No respiratory distress.     Breath sounds: No wheezing.  Abdominal:     General: Bowel sounds are normal. There is no distension.     Palpations: Abdomen is soft.  Musculoskeletal:        General: No deformity. Normal range of motion.     Cervical back: Normal range of motion and neck supple.  Skin:    General: Skin is warm and dry.     Findings: No erythema or rash.  Neurological:     Mental Status: She is alert and oriented to person, place, and time. Mental status is at baseline.     Cranial Nerves: No cranial nerve deficit.     Coordination: Coordination normal.  Psychiatric:        Mood and Affect: Mood normal.     LABORATORY DATA:  I have reviewed the data as listed Lab Results  Component Value Date   WBC 8.0 05/12/2020   HGB 13.8 05/12/2020   HCT 40.4 05/12/2020   MCV 98.1 05/12/2020   PLT 288 05/12/2020   Recent Labs    12/09/19 1558 03/12/20 1617 03/23/20 1819 03/24/20 1746 05/12/20 1212  NA 139 133* 129*  --   --   K 4.9 5.1 4.1  --   --   CL 100 97* 96*  --   --   CO2 24 26 24   --   --   GLUCOSE 102* 99 97  --   --   BUN 5* 9 <5*  --   --   CREATININE 0.82 0.79 0.73  --   --   CALCIUM 9.5 9.2 8.8*  --   --   GFRNONAA 85 >60 >60  --   --   GFRAA 98  --   --   --   --   PROT  --   --   --  7.1  8.2*  ALBUMIN  --   --   --  4.3 5.0  AST  --   --   --  18 20  ALT  --   --   --  21 14  ALKPHOS  --   --   --  26* 26*  BILITOT  --   --   --  0.5 0.5  BILIDIR  --   --   --  <0.1 <0.1  IBILI  --   --   --  NOT CALCULATED NOT CALCULATED   Iron/TIBC/Ferritin/ %Sat    Component Value Date/Time   IRON 89 05/12/2020 1212   TIBC 505 (H) 05/12/2020 1212   FERRITIN 14 05/12/2020 1212   IRONPCTSAT 18 05/12/2020 1212      RADIOGRAPHIC STUDIES: I have personally reviewed the radiological images as listed and agreed with the findings in the report. No results found.    ASSESSMENT & PLAN:  1. Heterozygous factor V Leiden mutation ([place])   2. Iron deficiency anemia, unspecified iron deficiency anemia type   3. Personal history of venous thrombosis and embolism    #Heterozygous factor V Leiden mutation Personal history  of gonadal vein thrombosis which appears to be provoked by her hysterectomy. She has been off Xarelto for years and is doing well clinically. Recommend patient to continue stay off Xarelto. Previous hypercoagulable work-up is negative.  #History of iron deficiency anemia. Check CBC, iron TIBC ferritin.  Check LDH, folate, B12, LFT and smear.  Orders Placed This Encounter  Procedures  . CBC with Differential/Platelet    Standing Status:   Future    Number of Occurrences:   1    Standing Expiration Date:   05/12/2021  . Technologist smear review    Standing Status:   Future    Number of Occurrences:   1    Standing Expiration Date:   05/12/2021  . Retic Panel    Standing Status:   Future    Number of Occurrences:   1    Standing Expiration Date:   05/12/2021  . Ferritin    Standing Status:   Future    Number of Occurrences:   1    Standing Expiration Date:   05/12/2021  . Iron and TIBC    Standing Status:   Future    Number of Occurrences:   1    Standing Expiration Date:   05/12/2021  . Lactate dehydrogenase    Standing Status:   Future    Number of  Occurrences:   1    Standing Expiration Date:   05/12/2021  . Folate    Standing Status:   Future    Number of Occurrences:   1    Standing Expiration Date:   05/12/2021  . Hepatic function panel    Standing Status:   Future    Number of Occurrences:   1    Standing Expiration Date:   05/12/2021  . Vitamin B12    Standing Status:   Future    Number of Occurrences:   1    Standing Expiration Date:   05/12/2021    All questions were answered. The patient knows to call the clinic with any problems questions or concerns.  cc Jodi Marble, MD    Return of visit: 2 weeks Thank you for this kind referral and the opportunity to participate in the care of this patient. A copy of today's note is routed to referring provider    Earlie Server, MD, PhD Hematology Oncology [facility] Pager- [phone] 05/12/2020

## 2020-05-12 NOTE — Progress Notes (Signed)
Patient here to establish care  

## 2020-05-13 DIAGNOSIS — M9901 Segmental and somatic dysfunction of cervical region: Secondary | ICD-10-CM | POA: Diagnosis not present

## 2020-05-13 DIAGNOSIS — M5413 Radiculopathy, cervicothoracic region: Secondary | ICD-10-CM | POA: Diagnosis not present

## 2020-05-17 DIAGNOSIS — M5413 Radiculopathy, cervicothoracic region: Secondary | ICD-10-CM | POA: Diagnosis not present

## 2020-05-17 DIAGNOSIS — M9901 Segmental and somatic dysfunction of cervical region: Secondary | ICD-10-CM | POA: Diagnosis not present

## 2020-05-21 ENCOUNTER — Ambulatory Visit: Payer: Medicare Other | Admitting: Gastroenterology

## 2020-05-24 DIAGNOSIS — G894 Chronic pain syndrome: Secondary | ICD-10-CM | POA: Diagnosis not present

## 2020-05-24 DIAGNOSIS — M9901 Segmental and somatic dysfunction of cervical region: Secondary | ICD-10-CM | POA: Diagnosis not present

## 2020-05-24 DIAGNOSIS — M2550 Pain in unspecified joint: Secondary | ICD-10-CM | POA: Diagnosis not present

## 2020-05-24 DIAGNOSIS — M542 Cervicalgia: Secondary | ICD-10-CM | POA: Diagnosis not present

## 2020-05-24 DIAGNOSIS — M791 Myalgia, unspecified site: Secondary | ICD-10-CM | POA: Diagnosis not present

## 2020-05-24 DIAGNOSIS — Z79899 Other long term (current) drug therapy: Secondary | ICD-10-CM | POA: Diagnosis not present

## 2020-05-24 DIAGNOSIS — M5413 Radiculopathy, cervicothoracic region: Secondary | ICD-10-CM | POA: Diagnosis not present

## 2020-05-26 ENCOUNTER — Encounter: Payer: Self-pay | Admitting: Oncology

## 2020-05-26 ENCOUNTER — Inpatient Hospital Stay (HOSPITAL_BASED_OUTPATIENT_CLINIC_OR_DEPARTMENT_OTHER): Payer: Medicare Other | Admitting: Oncology

## 2020-05-26 VITALS — BP 132/89 | HR 92 | Temp 98.7°F | Resp 18 | Wt 179.0 lb

## 2020-05-26 DIAGNOSIS — R748 Abnormal levels of other serum enzymes: Secondary | ICD-10-CM | POA: Diagnosis not present

## 2020-05-26 DIAGNOSIS — D509 Iron deficiency anemia, unspecified: Secondary | ICD-10-CM | POA: Diagnosis not present

## 2020-05-26 DIAGNOSIS — M069 Rheumatoid arthritis, unspecified: Secondary | ICD-10-CM | POA: Diagnosis not present

## 2020-05-26 DIAGNOSIS — M797 Fibromyalgia: Secondary | ICD-10-CM | POA: Diagnosis not present

## 2020-05-26 DIAGNOSIS — D6851 Activated protein C resistance: Secondary | ICD-10-CM | POA: Diagnosis not present

## 2020-05-26 DIAGNOSIS — Z8719 Personal history of other diseases of the digestive system: Secondary | ICD-10-CM | POA: Diagnosis not present

## 2020-05-26 DIAGNOSIS — J45909 Unspecified asthma, uncomplicated: Secondary | ICD-10-CM | POA: Diagnosis not present

## 2020-05-26 DIAGNOSIS — N3010 Interstitial cystitis (chronic) without hematuria: Secondary | ICD-10-CM | POA: Diagnosis not present

## 2020-05-26 DIAGNOSIS — Z79899 Other long term (current) drug therapy: Secondary | ICD-10-CM | POA: Diagnosis not present

## 2020-05-26 DIAGNOSIS — Z7901 Long term (current) use of anticoagulants: Secondary | ICD-10-CM | POA: Diagnosis not present

## 2020-05-26 DIAGNOSIS — Z86718 Personal history of other venous thrombosis and embolism: Secondary | ICD-10-CM | POA: Diagnosis not present

## 2020-05-26 NOTE — Progress Notes (Signed)
Patient denies new problems/concerns today.   °

## 2020-05-26 NOTE — Progress Notes (Signed)
Hematology/Oncology Consult note [facility] Telephone:([phone] [phone]   Patient Care Team: Jodi Marble, MD as PCP - General (Internal Medicine) End, Harrell Gave, MD as PCP - Cardiology (Cardiology)  REFERRING PROVIDER: Jodi Marble, MD  CHIEF COMPLAINTS/REASON FOR VISIT:  Evaluation of factor V Leiden mutation.  HISTORY OF PRESENTING ILLNESS:   April [name] is a  50 y.o.  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 Etheleen Nicks mehta.-Last seen 07/03/2016 Detailed medical records review was performed by me. 2010, patient had work-up due to her father was diagnosed with factor V Leiden mutation and has blood clots.  Patient had hypercoagulable work-up which showed heterozygous factor V Leiden mutation, negative prothrombin gene mutation  2013, patient developed right gonadal vein thrombosis with extension into IVC/right renal vein after hysterectomy.  Patient was started on Xarelto since then.  She establish care with Dr.Amit who recommended patient to come off Xarelto on 01/18/2015.  She was also tested for antiphospholipid panel after being off Xarelto and panel was negative. Patient denies any additional episodes of thrombosis.  She also has chronic iron deficiency anemia.  Her last EGD and colonoscopy in October 2016 were reported to be negative. Patient has had work-up for anemia including negative LDH, haptoglobin, appropriate erythropoietin level and reticulocyte count, celiac disease panel negative-January 2017, H2 breathe test  by GI negative per patient, Patient has tried oral iron supplementation which caused her to have diarrhea. Chronic aches and pains.  History of fibromyalgia.  INTERVAL HISTORY April [name] is a 50 y.o. female who has above history reviewed by me today presents for follow up visit for history of VTE, heterozygous factor V Leiden mutation. Problems and complaints are listed below: Patient had blood work done and presents to discuss results.  Review of Systems  Constitutional: Positive for fatigue. Negative for appetite change, chills and fever.  HENT:   Negative for hearing loss and voice change.   Eyes: Negative for eye problems.  Respiratory: Negative for chest tightness and cough.   Cardiovascular: Negative for chest pain.  Gastrointestinal: Negative for abdominal distention, abdominal pain and blood in stool.  Endocrine: Negative for hot flashes.  Genitourinary: Negative for difficulty urinating and frequency.   Musculoskeletal: Negative for arthralgias.  Skin: Negative for itching and rash.  Neurological: Negative for extremity weakness.  Hematological: Negative for adenopathy.  Psychiatric/Behavioral: Negative for confusion.    MEDICAL HISTORY:  Past Medical History:  Diagnosis Date  . Anxiety 12/14/2011  . Asthma    no inhaler  . Chest pain    a. 12/2015 CTA chest: no PE. No significant coronary Ca2+. Mosaic attenuation pattern in both lungs, may reflect small airway dzs; c. 12/2015 Ex MV: Walked 6 mins. No ischemia/scar.  . Diastolic dysfunction    a. 10/2015 Echo: EF nl, mod diast dysfxn. Mild MR/TR; b. 09/2016 Echo: EF 55-60%, no rwma. Nl RV size and fxn.  . Factor 5 Leiden mutation, heterozygous ([place])   . Fibromyalgia   . Fracture of 5th metatarsal 03/2011   left foot -    . Heart palpitations    a. Notes intermittent tachycardia (sinus). Did not  tolerate beta blockers 2/2 urinary incontinence.  . Interstitial cystitis   . Ovarian cyst   . Pancreatitis   . Pelvic  floor dysfunction   . Rheumatoid arteritis ([place])   . Sinus infection    chronic  . Syncope    a. 08/2017 Event monitor: Average heart rate 77 (49-136).  Periods of sinus arrhythmia noted.  Triggered events corresponded with sinus rhythm, sinus arrhythmia, and artifact.  No significant ectopy, sustained arrhythmias, or prolonged pauses observed.  . Thrombosis    a. 12/14/2011 Thrombosis of R gonadal vein with extension of thrombus into IVC to level of R renal vein per CT 12/11/12 of [place].    SURGICAL HISTORY: Past Surgical History:  Procedure Laterality Date  . ABDOMINAL HYSTERECTOMY    . CHOLECYSTECTOMY    . CYSTOSCOPY WITH HYDRODISTENSION AND BIOPSY  04/21/2011   Dr Philis Fendt  . LAPAROSCOPIC ASSISTED VAGINAL HYSTERECTOMY  08/21/2011   Procedure: LAPAROSCOPIC ASSISTED VAGINAL HYSTERECTOMY;  Surgeon: Cyril Mourning, MD;  Location: [facility];  Service: Gynecology;  Laterality: N/A;  OPEN LAPAROSCOPIC  . LAPAROSCOPIC NISSEN FUNDOPLICATION  3536  . neck ablation Left 12/29/2019  . neck ablation Right 01/05/2020  . SALPINGOOPHORECTOMY  08/21/2011   Procedure: SALPINGO OOPHERECTOMY;  Surgeon: Cyril Mourning, MD;  Location: [facility];  Service: Gynecology;  Laterality: Bilateral;  . TUBAL LIGATION  2010  . WISDOM TOOTH EXTRACTION      SOCIAL HISTORY: Social History   Socioeconomic History  . Marital status: Married    Spouse name: Not on file  . Number of children: Not on file  . Years of education: Not on file  . Highest education level: Not on file  Occupational History  . Not on file  Tobacco Use  . Smoking status: Never Smoker  . Smokeless tobacco: Never Used  Vaping Use  . Vaping Use: Never used  Substance and Sexual Activity  . Alcohol use: No  . Drug use: No  . Sexual activity: Yes    Birth control/protection: Surgical  Other Topics Concern  . Not on file  Social History Narrative  . Not on file   Social Determinants of Health   Financial Resource Strain: Not  on file  Food Insecurity: Not on file  Transportation Needs: Not on file  Physical Activity: Not on file  Stress: Not on file  Social Connections: Not on file  Intimate Partner Violence: Not on file    FAMILY HISTORY: Family History  Problem Relation Age of Onset  . Heart Problems Mother   . Vascular Disease Mother   . Skin cancer Mother   . Vascular Disease Maternal Grandmother   . Kidney Stones Father   . Skin cancer Maternal Uncle   . Anesthesia problems Neg Hx     ALLERGIES:  is allergic to amitriptyline, amoxicillin, bromelains, celexa [citalopram hydrobromide], ivp dye [iodinated diagnostic agents], nortriptyline, penicillins, pregabalin, bromelains [pineapple extract], budesonide-formoterol fumarate, citalopram, mangifera indica, methylene blue, sertraline, sulfa antibiotics, sulfonamide derivatives, bromaline [albertsons di bromm], and other.  MEDICATIONS:  Current Outpatient Medications  Medication Sig Dispense Refill  . albuterol (PROVENTIL HFA;VENTOLIN HFA) 108 (90 BASE) MCG/ACT inhaler Inhale 1 puff into the lungs as needed. For shortness of breath or wheezing    . Ascorbic Acid (VITA-C PO) Take by mouth.    [name] azelastine (ASTELIN) 0.1 % nasal spray as directed.    . Biotin 10000 MCG TABS Take by mouth daily.    . Calcium-Phosphorus-Vitamin D (CALCIUM GUMMIES PO) Take 1  tablet by mouth every evening.    . Cholecalciferol (VITAMIN D3) 5000 units TABS Take by mouth daily.    [name] co-enzyme Q-10 30 MG capsule Take 30 mg by mouth daily.    . diazepam (VALIUM) 10 MG tablet Take 10 mg by mouth daily.    [name] dicyclomine (BENTYL) 20 MG tablet Take 20 mg by mouth every 6 (six) hours.    [name] diltiazem (CARDIZEM CD) 120 MG 24 hr capsule TAKE 1 CAPSULE BY MOUTH ONCE DAILY 90 capsule 0  . diltiazem (CARDIZEM) 30 MG tablet Take 1 tablet (30 mg total) by mouth every 8 (eight) hours as needed (for breakthrough palpitations). 30 tablet 1  . ELMIRON 100 MG capsule Take 200 mg by mouth 2 (two)  times daily.  0  . fexofenadine (ALLEGRA) 180 MG tablet Take 180 mg by mouth every morning.    . fluticasone (FLONASE) 50 MCG/ACT nasal spray SHAKE LQ AND U 1 TO 2 SPRAYS IEN QD UTD  3  . Fluticasone-Salmeterol (ADVAIR) 250-50 MCG/DOSE AEPB Inhale 1 puff into the lungs 2 (two) times a day.     . furosemide (LASIX) 20 MG tablet Take 20 mg by mouth every morning.    . gabapentin (NEURONTIN) 800 MG tablet Take 800 mg by mouth 3 (three) times daily.    [name] guaiFENesin (MUCINEX) 600 MG 12 hr tablet Take 1,200 mg by mouth 2 (two) times daily.    [name] HYDROcodone-acetaminophen (NORCO/VICODIN) 5-325 MG tablet Take 1 tablet by mouth 4 (four) times daily as needed. for pain    . hydrOXYzine (ATARAX/VISTARIL) 50 MG tablet Take 50 mg by mouth at bedtime.    . hyoscyamine (LEVSIN SL) 0.125 MG SL tablet Place 0.125 mg under the tongue every 4 (four) hours as needed for cramping.    . Lactobacillus (ACIDOPHILUS PROBIOTIC PO) Take by mouth 3 (three) times daily.    [name] lidocaine (XYLOCAINE) 5 % ointment Apply topically daily.    . methocarbamol (ROBAXIN) 750 MG tablet Take 750 mg by mouth every 8 (eight) hours.    . montelukast (SINGULAIR) 10 MG tablet Take 10 mg by mouth at bedtime.    . nitrofurantoin (MACRODANTIN) 50 MG capsule Take 50 mg by mouth at bedtime.    [name] PROCTOZONE-HC 2.5 % rectal cream Apply topically as needed.  3  . Riboflavin (B2) 100 MG TABS Take by mouth daily.    . Simethicone 125 MG CAPS Take 125 mg by mouth 4 (four) times daily.    . TURMERIC PO Take by mouth daily.    . vitamin k 100 MCG tablet Take 100 mcg by mouth daily.    . Zinc 25 MG TABS Take by mouth daily.    . Cyanocobalamin 5000 MCG SUBL Place 5,000 mcg under the tongue daily.  (Patient not taking: Reported on 05/26/2020)    . fluconazole (DIFLUCAN) 100 MG tablet Take 100 mg by mouth daily. (Patient not taking: Reported on 05/26/2020)    . pantoprazole (PROTONIX) 40 MG tablet Take 40 mg by mouth every morning. (Patient not taking:  Reported on 05/26/2020)    . progesterone (PROMETRIUM) 100 MG capsule Take 100 mg by mouth daily.  (Patient not taking: Reported on 05/26/2020)    . valACYclovir (VALTREX) 500 MG tablet Take 500 mg by mouth 2 (two) times daily. (Patient not taking: Reported on 05/26/2020)    . VALACYCLOVIR HCL PO Take 1,000 mg by mouth daily.  (Patient not taking: Reported on 05/26/2020)  2  Current Facility-Administered Medications  Medication Dose Route Frequency Provider Last Rate Last Admin  . betamethasone acetate-betamethasone sodium phosphate (CELESTONE) injection 3 mg  3 mg Intramuscular Once Edrick Kins, DPM         PHYSICAL EXAMINATION: ECOG PERFORMANCE STATUS: 1 - Symptomatic but completely ambulatory Vitals:   05/26/20 1519  BP: 132/89  Pulse: 92  Resp: 18  Temp: 98.7 F (37.1 C)   Filed Weights   05/26/20 1519  Weight: 179 lb (81.2 kg)    Physical Exam Constitutional:      General: She is not in acute distress. HENT:     Head: Normocephalic and atraumatic.  Eyes:     General: No scleral icterus. Cardiovascular:     Rate and Rhythm: Normal rate and regular rhythm.     Heart sounds: Normal heart sounds.  Pulmonary:     Effort: Pulmonary effort is normal. No respiratory distress.     Breath sounds: No wheezing.  Abdominal:     General: Bowel sounds are normal. There is no distension.     Palpations: Abdomen is soft.  Musculoskeletal:        General: No deformity. Normal range of motion.     Cervical back: Normal range of motion and neck supple.  Skin:    General: Skin is warm and dry.     Findings: No erythema or rash.  Neurological:     Mental Status: She is alert and oriented to person, place, and time. Mental status is at baseline.     Cranial Nerves: No cranial nerve deficit.     Coordination: Coordination normal.  Psychiatric:        Mood and Affect: Mood normal.     LABORATORY DATA:  I have reviewed the data as listed Lab Results  Component Value Date   WBC  8.0 05/12/2020   HGB 13.8 05/12/2020   HCT 40.4 05/12/2020   MCV 98.1 05/12/2020   PLT 288 05/12/2020   Recent Labs    12/09/19 1558 03/12/20 1617 03/23/20 1819 03/24/20 1746 05/12/20 1212  NA 139 133* 129*  --   --   K 4.9 5.1 4.1  --   --   CL 100 97* 96*  --   --   CO2 24 26 24   --   --   GLUCOSE 102* 99 97  --   --   BUN 5* 9 <5*  --   --   CREATININE 0.82 0.79 0.73  --   --   CALCIUM 9.5 9.2 8.8*  --   --   GFRNONAA 85 >60 >60  --   --   GFRAA 98  --   --   --   --   PROT  --   --   --  7.1 8.2*  ALBUMIN  --   --   --  4.3 5.0  AST  --   --   --  18 20  ALT  --   --   --  21 14  ALKPHOS  --   --   --  26* 26*  BILITOT  --   --   --  0.5 0.5  BILIDIR  --   --   --  <0.1 <0.1  IBILI  --   --   --  NOT CALCULATED NOT CALCULATED   Iron/TIBC/Ferritin/ %Sat    Component Value Date/Time   IRON 89 05/12/2020 1212   TIBC 505 (H) 05/12/2020 1212  FERRITIN 14 05/12/2020 1212   IRONPCTSAT 18 05/12/2020 1212      RADIOGRAPHIC STUDIES: I have personally reviewed the radiological images as listed and agreed with the findings in the report. No results found.    ASSESSMENT & PLAN:  1. Heterozygous factor V Leiden mutation ([place])   2. Iron deficiency anemia, unspecified iron deficiency anemia type   3. Personal history of venous thrombosis and embolism   4. Low serum alkaline phosphatase    #Heterozygous factor V Leiden mutation Personal history of gonadal vein thrombosis which appears to be provoked by her hysterectomy. She has been off Xarelto for years and is doing well clinically. Recommend patient to continue stay off Xarelto. Previous hypercoagulable work-up is negative. Recommend patient to get prophylactic anticoagulation if anticipating prolonged immobilization.  #History of iron deficiency anemia. Labs reviewed and discussed with patient. Hemoglobin is normal, normal ferritin and iron saturation.  Slightly increased TIBC.  Normal reticulocyte hemoglobin.   Indicating adequate iron stores. No need for iron supplementation at this point.  High vitamin B12 level.  Discussed with patient and advised her to stop over-the-counter vitamin B12 supplementation.  She agrees with the plan.  Low alkaline phosphatase level.  Not clinically significant.  If patient continues to have concerns I recommend her to further discuss with primary care provider.  Slightly increased total protein, 8.2.  Recommend patient continue follow-up with primary care provider.  I do not think she needs additional follow-up with heme-onc at this point.  She can be referred back if indicated. All questions were answered. The patient knows to call the clinic with any problems questions or concerns.  cc Jodi Marble, MD    Thank you for this kind referral and the opportunity to participate in the care of this patient. A copy of today's note is routed to referring provider    Earlie Server, MD, PhD Hematology Oncology [facility] Pager- [phone] 05/26/2020

## 2020-05-27 DIAGNOSIS — M9901 Segmental and somatic dysfunction of cervical region: Secondary | ICD-10-CM | POA: Diagnosis not present

## 2020-05-27 DIAGNOSIS — M5413 Radiculopathy, cervicothoracic region: Secondary | ICD-10-CM | POA: Diagnosis not present

## 2020-05-31 DIAGNOSIS — M9901 Segmental and somatic dysfunction of cervical region: Secondary | ICD-10-CM | POA: Diagnosis not present

## 2020-05-31 DIAGNOSIS — M5413 Radiculopathy, cervicothoracic region: Secondary | ICD-10-CM | POA: Diagnosis not present

## 2020-06-02 DIAGNOSIS — Z1231 Encounter for screening mammogram for malignant neoplasm of breast: Secondary | ICD-10-CM | POA: Diagnosis not present

## 2020-06-09 ENCOUNTER — Telehealth: Payer: Self-pay | Admitting: Internal Medicine

## 2020-06-09 NOTE — Telephone Encounter (Signed)
Pt c/o BP issue: STAT if pt c/o blurred vision, one-sided weakness or slurred speech  1. What are your last 5 BP readings? 06/08/20 104/61 HR 87, 105/64 HR 90, 06/06/20 107/66 HR 88, 06/05/20 111/72 HR 78, 05/31/20 106/68 HR 75. 05/31/20 103/56 HR 90  2. Are you having any other symptoms (ex. Dizziness, headache, blurred vision, passed out)? Headache, confusion, nausea  3. What is your BP issue? Up & down issues wants it checked.

## 2020-06-09 NOTE — Telephone Encounter (Signed)
Spoke with patient who was insistent that she needed to be seen as she was worried about her sodium levels from 03/2020 and 12/2019. She stated that they were 129 & 139. I informed her she was seen by Marrianne Mood the day after her lab draw and this was reviewed. Patient stated that her OB/GYN is worried about her NA+.  Patient stated that the BP as listed below are low for what she usually runs and she is worried about them. I informed her that her heart rate and BP were all WNL, and that her BP on 03/24/20 was 94/66. Patient wants to be seen for a follow up, I scheduled her with Ignacia Bayley on 06/18/20. Patient was grateful for the appointment.

## 2020-06-15 DIAGNOSIS — M9901 Segmental and somatic dysfunction of cervical region: Secondary | ICD-10-CM | POA: Diagnosis not present

## 2020-06-15 DIAGNOSIS — M5413 Radiculopathy, cervicothoracic region: Secondary | ICD-10-CM | POA: Diagnosis not present

## 2020-06-18 ENCOUNTER — Other Ambulatory Visit: Payer: Self-pay

## 2020-06-18 ENCOUNTER — Encounter: Payer: Self-pay | Admitting: Nurse Practitioner

## 2020-06-18 ENCOUNTER — Ambulatory Visit (INDEPENDENT_AMBULATORY_CARE_PROVIDER_SITE_OTHER): Payer: Medicare Other | Admitting: Nurse Practitioner

## 2020-06-18 ENCOUNTER — Other Ambulatory Visit
Admission: RE | Admit: 2020-06-18 | Discharge: 2020-06-18 | Disposition: A | Discharge status: Home / Self Care | Payer: Medicare Other | Attending: Nurse Practitioner | Admitting: Nurse Practitioner

## 2020-06-18 VITALS — BP 140/80 | HR 88 | Ht 67.0 in | Wt 177.4 lb

## 2020-06-18 DIAGNOSIS — R0789 Other chest pain: Secondary | ICD-10-CM

## 2020-06-18 DIAGNOSIS — I5189 Other ill-defined heart diseases: Secondary | ICD-10-CM

## 2020-06-18 DIAGNOSIS — R002 Palpitations: Secondary | ICD-10-CM | POA: Diagnosis not present

## 2020-06-18 DIAGNOSIS — E871 Hypo-osmolality and hyponatremia: Secondary | ICD-10-CM

## 2020-06-18 LAB — BASIC METABOLIC PANEL
Anion gap: 7 (ref 5–15)
BUN: 6 mg/dL (ref 6–20)
CO2: 26 mmol/L (ref 22–32)
Calcium: 9.6 mg/dL (ref 8.9–10.3)
Chloride: 103 mmol/L (ref 98–111)
Creatinine, Ser: 0.88 mg/dL (ref 0.44–1.00)
GFR, Estimated: >60 mL/min (ref >60)
Glucose, Bld: 82 mg/dL (ref 70–99)
Potassium: 4.6 mmol/L (ref 3.5–5.1)
Sodium: 136 mmol/L (ref 135–145)

## 2020-06-18 NOTE — Patient Instructions (Signed)
Medication Instructions:  Your physician recommends that you continue on your current medications as directed. Please refer to the Current Medication list given to you today.  *If you need a refill on your cardiac medications before your next appointment, please call your pharmacy*   Lab Work: BMP to be drawn today at the medical mall. Stop at the registration desk to check in.  If you have labs (blood work) drawn today and your tests are completely normal, you will receive your results only by: [name] MyChart Message (if you have MyChart) OR . A paper copy in the mail If you have any lab test that is abnormal or we need to change your treatment, we will call you to review the results.   Testing/Procedures: None ordered   Follow-Up: At [facility], you and your health needs are our priority.  As part of our continuing mission to provide you with exceptional heart care, we have created designated Provider Care Teams.  These Care Teams include your primary Cardiologist (physician) and Advanced Practice Providers (APPs -  Physician Assistants and Nurse Practitioners) who all work together to provide you with the care you need, when you need it.  We recommend signing up for the patient portal called "MyChart".  Sign up information is provided on this After Visit Summary.  MyChart is used to connect with patients for Virtual Visits (Telemedicine).  Patients are able to view lab/test results, encounter notes, upcoming appointments, etc.  Non-urgent messages can be sent to your provider as well.   To learn more about what you can do with MyChart, go to [email or web address].    Your next appointment:   As needed  The format for your next appointment:   In Person  Provider:   You may see [name], MD or one of the following Advanced Practice Providers on your designated Care Team:    Murray Hodgkins, NP  Christell Faith, PA-C  Marrianne Mood, PA-C  Cadence Kathlen Mody, [place]  Laurann Montana, NP    Other Instructions Please call [facility] [phone]. They will help you find a local primary care provider.

## 2020-06-18 NOTE — Progress Notes (Signed)
Office Visit    Patient Name: April [name] Date of Encounter: 06/18/2020  Primary Care Provider:  Jodi Marble, MD Primary Cardiologist:  [name], MD  Chief Complaint    50 year old female with a history of fibromyalgia, atypical chest pain, factor V Leiden mutation, anemia, and diastolic dysfunction, who presents for follow-up related to hyponatremia.  Past Medical History    Past Medical History:  Diagnosis Date  . Anxiety 12/14/2011  . Asthma    no inhaler  . Diastolic dysfunction    a. 10/2015 Echo: EF nl, mod diast dysfxn. Mild MR/TR; b. 09/2016 Echo: EF 55-60%, no rwma. Nl RV size and fxn; c. 03/2020 Echo: EF 60-65%. No rwma.  . Factor 5 Leiden mutation, heterozygous ([place])   . Fibromyalgia   . Fracture of 5th metatarsal 03/2011   left foot -    . Heart palpitations    a. Notes intermittent tachycardia (sinus). Did not tolerate beta blockers 2/2 urinary incontinence; b. 07/2017 Event monitor: Avg HR 77 (49-136). No significant ectopy/pauses. Triggered events corresponded w/ sinus rhythm, sinus arrhythmias, and artifact.  . Interstitial cystitis   . Non-cardiac chest pain    a. 12/2015 CTA chest: no PE. No significant coronary Ca2+. Mosaic attenuation pattern in both lungs, may reflect small airway dzs; c. 12/2015 Ex MV: Walked 6 mins. No ischemia/scar; d. 12/2019 Cor CTA: Ca2+ = 0. Nl cors. ? PFO w/o evidence of flow (not seen on echo).  . Ovarian cyst   . Pancreatitis   . Pelvic floor dysfunction   . Rheumatoid arteritis ([place])   . Sinus infection    chronic  . Syncope    a. 08/2017 Event monitor: Average heart rate 77 (49-136).  Periods of sinus arrhythmia noted.  Triggered events corresponded with sinus rhythm, sinus arrhythmia, and artifact.  No significant ectopy, sustained arrhythmias, or prolonged pauses observed.  . Thrombosis    a. 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   Past Surgical History:  Procedure Laterality Date  . ABDOMINAL HYSTERECTOMY    . CHOLECYSTECTOMY    . CYSTOSCOPY WITH HYDRODISTENSION AND BIOPSY  04/21/2011   Dr Philis Fendt  . LAPAROSCOPIC ASSISTED VAGINAL HYSTERECTOMY  08/21/2011   Procedure: LAPAROSCOPIC ASSISTED VAGINAL HYSTERECTOMY;  Surgeon: Cyril Mourning, MD;  Location: [facility];  Service: Gynecology;  Laterality: N/A;  OPEN LAPAROSCOPIC  . LAPAROSCOPIC NISSEN FUNDOPLICATION  0623  . neck ablation Left 12/29/2019  . neck ablation Right 01/05/2020  . SALPINGOOPHORECTOMY  08/21/2011   Procedure: SALPINGO OOPHERECTOMY;  Surgeon: Cyril Mourning, MD;  Location: [facility];  Service: Gynecology;  Laterality: Bilateral;  . TUBAL LIGATION  2010  . WISDOM TOOTH EXTRACTION      Allergies  Allergies  Allergen Reactions  . Amitriptyline Other (See Comments)    Other reaction(s): Other Change in mental status  . Amoxicillin Rash  . Bromelains     Other reaction(s): Abdominal pain  . Celexa [Citalopram Hydrobromide] Other (See Comments)    Other reaction(s): Other Change in mental status  . Ivp Dye [Iodinated Diagnostic Agents] Anaphylaxis    Pt reports "I do not have contrast allergy and have received IVP dye without pre-med with NO reaction."   . Nortriptyline     Other reaction(s): Other  . Penicillins Swelling and Rash  . Pregabalin     Other reaction(s): Myalgias (muscle pain)  . Bromelains [employer]  Extract] Other (See Comments)    Severe pain  . Budesonide-Formoterol Fumarate     Other reaction(s): Other  . Citalopram Other (See Comments)  . Mangifera Indica Swelling  . Methylene Blue Other (See Comments)    Caused inflamed pancreas, per pt should not use  . Sertraline Other (See Comments)    Other reaction(s): Other Suicidal thoughts  . Sulfa Antibiotics Other (See Comments)  . Sulfonamide Derivatives Other (See Comments)    excrutiating pain, patient states it caused her to have osteoarthritis  .  Bromaline [Albertsons Di Bromm] Rash  . 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  She was previously on Xarelto however, that was discontinued in the setting of severe anemia.  Other history includes obesity, asthma, rheumatoid arthritis, urinary incontinence, and multiple drug allergies.  echocardiogram in September 2016 in the setting of dyspnea showed normal LV function with moderate diastolic dysfunction.  In November 2017, she underwent stress testing in the setting of atypical chest pain, which did not show any evidence of ischemia or scar.  She has a history of palpitations for which she was previously on beta-blocker (metoprolol followed by carvedilol).  Beta-blocker therapy worsened urinary incontinence and she has been managed with diltiazem.  A follow-up echocardiogram in September 2018 performed at the patient's request, showed stable/normal LV function without mention of diastolic dysfunction.  She has since had almost annual echocardiograms, the last of which in 03/2020 showing nl EF w/ nl diastolic parameters.  In the setting of ongoing atypical symptoms of chest and neck pain, she underwent coronary CT angiography in November 2021.  This showed a coronary calcium score of 0 with normal coronary arteries.  There was question of a PFO, though this was apparently not felt to be significant by our structural heart team and was not seen on follow-up echo in February 2022.  At her last cardiology visit visit on February 23, she reported volume excess, abdominal pain, diarrhea, neck pain, back pain, intermittent tachypalpitations, weakness, fatigue, and stated that she was also recently diagnosed with pheochromocytoma.  24-hour  urine for catecholamines and metanephrines was repeated and was nl.  Since her last visit, she has continued to have episodic palpitations which successfully abate with short acting diltiazem.  She has some degree of chronic, stable dyspnea on exertion.  She has been using Lasix daily because she feels like when she does not take it that she has trouble breathing and is filling up with fluid.  She continues have intermittent chest and neck pain.  Her primary reason for presentation today is that she noted on labs from February 22 that her sodium was low at 129.  It was 133 in February 11 and 139 November 2021.  Upon reviewing symptoms related to hyponatremia on the Internet, she became concerned that low settings may be contributing to many of her symptoms including malaise and fatigue.  She was also concerned about soft blood pressures at home.  She contacted our office the other day and was added to my schedule today.  Home Medications    Prior to Admission medications   Medication Sig Start Date End Date Taking? Authorizing Provider  albuterol (PROVENTIL HFA;VENTOLIN HFA) 108 (90 BASE) MCG/ACT  inhaler Inhale 1 puff into the lungs as needed. For shortness of breath or wheezing    [provider]  Ascorbic Acid (VITA-C PO) Take by mouth.    [provider]  azelastine (ASTELIN) 0.1 % nasal spray as directed. 09/13/18   [provider]  Biotin 10000 MCG TABS Take by mouth daily.    [provider]  Calcium-Phosphorus-Vitamin D (CALCIUM GUMMIES PO) Take 1 tablet by mouth every evening.    [provider]  Cholecalciferol (VITAMIN D3) 5000 units TABS Take by mouth daily.    [provider]  co-enzyme Q-10 30 MG capsule Take 30 mg by mouth daily.    [provider]  Cyanocobalamin 5000 MCG SUBL Place 5,000 mcg under the tongue daily.  Patient not taking: Reported on 05/26/2020    [provider]  diazepam (VALIUM) 10 MG tablet Take  10 mg by mouth daily.    [provider]  dicyclomine (BENTYL) 20 MG tablet Take 20 mg by mouth every 6 (six) hours.    [provider]  diltiazem (CARDIZEM CD) 120 MG 24 hr capsule TAKE 1 CAPSULE BY MOUTH ONCE DAILY 04/05/20   End, Harrell Gave, MD  diltiazem (CARDIZEM) 30 MG tablet Take 1 tablet (30 mg total) by mouth every 8 (eight) hours as needed (for breakthrough palpitations). 12/09/19 12/08/20  Marrianne Mood D, PA-C  ELMIRON 100 MG capsule Take 200 mg by mouth 2 (two) times daily. 09/26/16   [provider]  fexofenadine (ALLEGRA) 180 MG tablet Take 180 mg by mouth every morning. 12/01/19   [provider]  fluconazole (DIFLUCAN) 100 MG tablet Take 100 mg by mouth daily. Patient not taking: Reported on 05/26/2020 02/23/20   [provider]  fluticasone (FLONASE) 50 MCG/ACT nasal spray SHAKE LQ AND U 1 TO 2 SPRAYS IEN QD UTD 06/03/16   [provider]  Fluticasone-Salmeterol (ADVAIR) 250-50 MCG/DOSE AEPB Inhale 1 puff into the lungs 2 (two) times a day.     [provider]  furosemide (LASIX) 20 MG tablet Take 20 mg by mouth every morning. 04/17/20   [provider]  gabapentin (NEURONTIN) 800 MG tablet Take 800 mg by mouth 3 (three) times daily. 11/20/19   [provider]  guaiFENesin (MUCINEX) 600 MG 12 hr tablet Take 1,200 mg by mouth 2 (two) times daily.    [provider]  HYDROcodone-acetaminophen (NORCO/VICODIN) 5-325 MG tablet Take 1 tablet by mouth 4 (four) times daily as needed. for pain 11/05/19   [provider]  hydrOXYzine (ATARAX/VISTARIL) 50 MG tablet Take 50 mg by mouth at bedtime.    [provider]  hyoscyamine (LEVSIN SL) 0.125 MG SL tablet Place 0.125 mg under the tongue every 4 (four) hours as needed for cramping.    [provider]  Lactobacillus (ACIDOPHILUS PROBIOTIC PO) Take by mouth 3 (three) times daily.    [provider]  lidocaine (XYLOCAINE) 5 %  ointment Apply topically daily. 09/26/16   [provider]  methocarbamol (ROBAXIN) 750 MG tablet Take 750 mg by mouth every 8 (eight) hours. 10/30/19   [provider]  montelukast (SINGULAIR) 10 MG tablet Take 10 mg by mouth at bedtime.    [provider]  nitrofurantoin (MACRODANTIN) 50 MG capsule Take 50 mg by mouth at bedtime.    [provider]  pantoprazole (PROTONIX) 40 MG tablet Take 40 mg by mouth every morning. Patient not taking: Reported on 05/26/2020 12/01/19   [provider]  PROCTOZONE-HC 2.5 % rectal cream Apply topically as needed. 09/26/16   [provider]  progesterone (PROMETRIUM) 100 MG capsule Take 100 mg by mouth daily.  Patient not taking: Reported on 05/26/2020 08/03/16   [provider]  Riboflavin (B2) 100 MG TABS Take by mouth daily.    [provider]  Simethicone 125 MG CAPS Take 125 mg by mouth 4 (four) times daily.    [provider]  TURMERIC PO Take by mouth daily.    [provider]  valACYclovir (VALTREX) 500 MG tablet Take 500 mg by mouth 2 (two) times daily. Patient not taking: Reported on 05/26/2020 11/07/19   [provider]  VALACYCLOVIR HCL PO Take 1,000 mg by mouth daily.  Patient not taking: Reported on 05/26/2020 06/07/16   [provider]  vitamin k 100 MCG tablet Take 100 mcg by mouth daily.    [provider]  Zinc 25 MG TABS Take by mouth daily.    [provider]    Review of Systems    Chronic intermittent chest pain, palpitations, neck pain that sometimes moves to the scalp.  Chronic fatigue and malaise.  Occasional dizziness.  Notes lower extremity swelling if she does not take Lasix.  She denies PND, orthopnea, syncope, or early satiety.  All other systems reviewed and are otherwise negative except as noted above.  Physical Exam    VS:  BP 140/80   Pulse 88   Ht 5\' 7"  (1.702 m)   Wt 177 lb 6.4 oz (80.5 kg)   LMP  05/20/2011   BMI 27.78 kg/m  , BMI Body mass index is 27.78 kg/m. GEN: Well nourished, well developed, in no acute distress. HEENT: normal. Neck: Supple, no JVD, carotid bruits, or masses. Cardiac: RRR, no murmurs, rubs, or gallops. No clubbing, cyanosis, edema.  Radials 2+/PT 2+ and equal bilaterally.  Respiratory:  Respirations regular and unlabored, clear to auscultation bilaterally. GI: Soft, nontender, nondistended, BS + x 4. MS: no deformity or atrophy. Skin: warm and dry, no rash. Neuro:  Strength and sensation are intact. Psych: Normal affect.  Accessory Clinical Findings    Lab Results  Component Value Date   WBC 8.0 05/12/2020   HGB 13.8 05/12/2020   HCT 40.4 05/12/2020   MCV 98.1 05/12/2020   PLT 288 05/12/2020   Lab Results  Component Value Date   CREATININE 0.73 03/23/2020   BUN <5 (L) 03/23/2020   NA 129 (L) 03/23/2020   K 4.1 03/23/2020   CL 96 (L) 03/23/2020   CO2 24 03/23/2020   Lab Results  Component Value Date   ALT 14 05/12/2020   AST 20 05/12/2020   ALKPHOS 26 (L) 05/12/2020   BILITOT 0.5 05/12/2020    Assessment & Plan    1.  Hyponatremia: This is the patient's primary concern today given a sodium of 129 in February.  She has multiple complaints, which are chronic and preceded hyponatremia.  We discussed that daily Lasix may contribute to hyponatremia and also that sodium levels would be expected to fluctuate and though 129 is low, it is not profoundly low and would not be expected to cause symptoms.  She has been taking salt tablets.  Follow-up basic metabolic panel today.  May need to adjust diuretic if sodium is persistently low.  Also recommended follow-up with primary care.  2.  History of atypical chest pain: Normal coronary arteries with calcium score of 0 on coronary CTA in November 2021.  3.  Palpitations: Managed with long-acting diltiazem and as needed short acting diltiazem.  Previous monitoring did not show any significant arrhythmias  and triggered events were associated with sinus rhythm, sinus arrhythmia, and artifact.  4.  Disposition: Follow-up basic metabolic panel.  Establish care with primary care.  Follow-up as needed.  Murray Hodgkins, NP 06/18/2020, 12:40 PM

## 2020-06-22 DIAGNOSIS — M9901 Segmental and somatic dysfunction of cervical region: Secondary | ICD-10-CM | POA: Diagnosis not present

## 2020-06-22 DIAGNOSIS — M5413 Radiculopathy, cervicothoracic region: Secondary | ICD-10-CM | POA: Diagnosis not present

## 2020-06-29 DIAGNOSIS — M5413 Radiculopathy, cervicothoracic region: Secondary | ICD-10-CM | POA: Diagnosis not present

## 2020-06-29 DIAGNOSIS — M9901 Segmental and somatic dysfunction of cervical region: Secondary | ICD-10-CM | POA: Diagnosis not present

## 2020-06-30 ENCOUNTER — Ambulatory Visit: Payer: Medicare Other | Admitting: Gastroenterology

## 2020-07-02 DIAGNOSIS — M47812 Spondylosis without myelopathy or radiculopathy, cervical region: Secondary | ICD-10-CM | POA: Diagnosis not present

## 2020-07-02 DIAGNOSIS — Z79899 Other long term (current) drug therapy: Secondary | ICD-10-CM | POA: Diagnosis not present

## 2020-07-02 DIAGNOSIS — M5412 Radiculopathy, cervical region: Secondary | ICD-10-CM | POA: Diagnosis not present

## 2020-07-02 DIAGNOSIS — M1990 Unspecified osteoarthritis, unspecified site: Secondary | ICD-10-CM | POA: Diagnosis not present

## 2020-07-02 DIAGNOSIS — M4711 Other spondylosis with myelopathy, occipito-atlanto-axial region: Secondary | ICD-10-CM | POA: Diagnosis not present

## 2020-07-05 ENCOUNTER — Other Ambulatory Visit: Payer: Self-pay | Admitting: Internal Medicine

## 2020-07-05 DIAGNOSIS — G4489 Other headache syndrome: Secondary | ICD-10-CM | POA: Diagnosis not present

## 2020-07-05 DIAGNOSIS — R197 Diarrhea, unspecified: Secondary | ICD-10-CM | POA: Diagnosis not present

## 2020-07-05 DIAGNOSIS — M797 Fibromyalgia: Secondary | ICD-10-CM | POA: Diagnosis not present

## 2020-07-05 DIAGNOSIS — B029 Zoster without complications: Secondary | ICD-10-CM | POA: Diagnosis not present

## 2020-07-05 DIAGNOSIS — R55 Syncope and collapse: Secondary | ICD-10-CM | POA: Diagnosis not present

## 2020-07-05 DIAGNOSIS — J301 Allergic rhinitis due to pollen: Secondary | ICD-10-CM | POA: Diagnosis not present

## 2020-07-07 ENCOUNTER — Telehealth: Payer: Self-pay | Admitting: Internal Medicine

## 2020-07-07 NOTE — Telephone Encounter (Signed)
Patient calling in after being instructed by Elenor Quinones and Ignacia Bayley  to  stop her lasix. Patient reports weight gain, swelling headaches and hand pain due to swelling levels. Patient reports she hasn't been taking her lasix since the day of her last visit. Patient is wanting to discuss her best options and what she should be doing whether continuing to d/c or start back  Please advise

## 2020-07-07 NOTE — Telephone Encounter (Signed)
Spoke with patient about increase in edema since her last appointment.  Patient had stopped her lasix after the last office visit with Ignacia Bayley.  This RN went over his last office note and let her know that nothing was mentioned about stopping Lasix.  We will continue to monitor her sodium levels and may need to adjust the lasix.  Patient was told to go back on her lasix dose and call if things don't approve.  Patient appreciative of call.

## 2020-07-08 DIAGNOSIS — M9901 Segmental and somatic dysfunction of cervical region: Secondary | ICD-10-CM | POA: Diagnosis not present

## 2020-07-08 DIAGNOSIS — M5413 Radiculopathy, cervicothoracic region: Secondary | ICD-10-CM | POA: Diagnosis not present

## 2020-07-09 NOTE — Telephone Encounter (Signed)
Spoke with patient and reviewed my chart message that she has not seen yet. She verbalized understanding of message with no further questions at this time.

## 2020-07-22 ENCOUNTER — Ambulatory Visit: Payer: Medicare Other | Admitting: Internal Medicine

## 2020-07-26 DIAGNOSIS — M791 Myalgia, unspecified site: Secondary | ICD-10-CM | POA: Diagnosis not present

## 2020-07-26 DIAGNOSIS — M542 Cervicalgia: Secondary | ICD-10-CM | POA: Diagnosis not present

## 2020-07-26 DIAGNOSIS — M2550 Pain in unspecified joint: Secondary | ICD-10-CM | POA: Diagnosis not present

## 2020-07-26 DIAGNOSIS — G894 Chronic pain syndrome: Secondary | ICD-10-CM | POA: Diagnosis not present

## 2020-07-26 DIAGNOSIS — Z79899 Other long term (current) drug therapy: Secondary | ICD-10-CM | POA: Diagnosis not present

## 2020-08-23 DIAGNOSIS — H5213 Myopia, bilateral: Secondary | ICD-10-CM | POA: Diagnosis not present

## 2020-08-23 DIAGNOSIS — H524 Presbyopia: Secondary | ICD-10-CM | POA: Diagnosis not present

## 2020-08-30 DIAGNOSIS — G894 Chronic pain syndrome: Secondary | ICD-10-CM | POA: Diagnosis not present

## 2020-08-30 DIAGNOSIS — R200 Anesthesia of skin: Secondary | ICD-10-CM | POA: Diagnosis not present

## 2020-08-30 DIAGNOSIS — M2550 Pain in unspecified joint: Secondary | ICD-10-CM | POA: Diagnosis not present

## 2020-08-30 DIAGNOSIS — M542 Cervicalgia: Secondary | ICD-10-CM | POA: Diagnosis not present

## 2020-08-30 DIAGNOSIS — M791 Myalgia, unspecified site: Secondary | ICD-10-CM | POA: Diagnosis not present

## 2020-08-30 DIAGNOSIS — Z79899 Other long term (current) drug therapy: Secondary | ICD-10-CM | POA: Diagnosis not present

## 2020-09-15 DIAGNOSIS — M9901 Segmental and somatic dysfunction of cervical region: Secondary | ICD-10-CM | POA: Diagnosis not present

## 2020-09-15 DIAGNOSIS — M5413 Radiculopathy, cervicothoracic region: Secondary | ICD-10-CM | POA: Diagnosis not present

## 2020-09-23 DIAGNOSIS — R202 Paresthesia of skin: Secondary | ICD-10-CM | POA: Diagnosis not present

## 2020-09-28 ENCOUNTER — Other Ambulatory Visit: Payer: Self-pay | Admitting: Internal Medicine

## 2020-10-25 DIAGNOSIS — Z79899 Other long term (current) drug therapy: Secondary | ICD-10-CM | POA: Diagnosis not present

## 2020-10-25 DIAGNOSIS — G894 Chronic pain syndrome: Secondary | ICD-10-CM | POA: Diagnosis not present

## 2020-10-25 DIAGNOSIS — R200 Anesthesia of skin: Secondary | ICD-10-CM | POA: Diagnosis not present

## 2020-10-25 DIAGNOSIS — M791 Myalgia, unspecified site: Secondary | ICD-10-CM | POA: Diagnosis not present

## 2020-10-25 DIAGNOSIS — M2550 Pain in unspecified joint: Secondary | ICD-10-CM | POA: Diagnosis not present

## 2020-10-25 DIAGNOSIS — M542 Cervicalgia: Secondary | ICD-10-CM | POA: Diagnosis not present

## 2020-10-26 DIAGNOSIS — I10 Essential (primary) hypertension: Secondary | ICD-10-CM | POA: Diagnosis not present

## 2020-10-26 DIAGNOSIS — E039 Hypothyroidism, unspecified: Secondary | ICD-10-CM | POA: Diagnosis not present

## 2020-10-26 DIAGNOSIS — R7301 Impaired fasting glucose: Secondary | ICD-10-CM | POA: Diagnosis not present

## 2020-10-27 DIAGNOSIS — M797 Fibromyalgia: Secondary | ICD-10-CM | POA: Diagnosis not present

## 2020-10-27 DIAGNOSIS — R197 Diarrhea, unspecified: Secondary | ICD-10-CM | POA: Diagnosis not present

## 2020-10-27 DIAGNOSIS — E785 Hyperlipidemia, unspecified: Secondary | ICD-10-CM | POA: Diagnosis not present

## 2020-10-27 DIAGNOSIS — G4489 Other headache syndrome: Secondary | ICD-10-CM | POA: Diagnosis not present

## 2020-10-27 DIAGNOSIS — J45998 Other asthma: Secondary | ICD-10-CM | POA: Diagnosis not present

## 2020-10-27 DIAGNOSIS — R55 Syncope and collapse: Secondary | ICD-10-CM | POA: Diagnosis not present

## 2020-10-27 DIAGNOSIS — J301 Allergic rhinitis due to pollen: Secondary | ICD-10-CM | POA: Diagnosis not present

## 2020-10-27 DIAGNOSIS — B029 Zoster without complications: Secondary | ICD-10-CM | POA: Diagnosis not present

## 2020-10-27 DIAGNOSIS — E039 Hypothyroidism, unspecified: Secondary | ICD-10-CM | POA: Diagnosis not present

## 2020-11-01 DIAGNOSIS — M1990 Unspecified osteoarthritis, unspecified site: Secondary | ICD-10-CM | POA: Diagnosis not present

## 2020-11-01 DIAGNOSIS — M5412 Radiculopathy, cervical region: Secondary | ICD-10-CM | POA: Diagnosis not present

## 2020-11-01 DIAGNOSIS — Z79899 Other long term (current) drug therapy: Secondary | ICD-10-CM | POA: Diagnosis not present

## 2020-11-01 DIAGNOSIS — M47812 Spondylosis without myelopathy or radiculopathy, cervical region: Secondary | ICD-10-CM | POA: Diagnosis not present

## 2020-11-01 DIAGNOSIS — M4711 Other spondylosis with myelopathy, occipito-atlanto-axial region: Secondary | ICD-10-CM | POA: Diagnosis not present

## 2020-11-16 DIAGNOSIS — M5413 Radiculopathy, cervicothoracic region: Secondary | ICD-10-CM | POA: Diagnosis not present

## 2020-11-16 DIAGNOSIS — M9901 Segmental and somatic dysfunction of cervical region: Secondary | ICD-10-CM | POA: Diagnosis not present

## 2020-11-23 DIAGNOSIS — G4489 Other headache syndrome: Secondary | ICD-10-CM | POA: Diagnosis not present

## 2020-11-23 DIAGNOSIS — J301 Allergic rhinitis due to pollen: Secondary | ICD-10-CM | POA: Diagnosis not present

## 2020-11-23 DIAGNOSIS — B029 Zoster without complications: Secondary | ICD-10-CM | POA: Diagnosis not present

## 2020-11-23 DIAGNOSIS — D649 Anemia, unspecified: Secondary | ICD-10-CM | POA: Diagnosis not present

## 2020-11-23 DIAGNOSIS — J45998 Other asthma: Secondary | ICD-10-CM | POA: Diagnosis not present

## 2020-11-23 DIAGNOSIS — M797 Fibromyalgia: Secondary | ICD-10-CM | POA: Diagnosis not present

## 2020-11-23 DIAGNOSIS — E785 Hyperlipidemia, unspecified: Secondary | ICD-10-CM | POA: Diagnosis not present

## 2020-11-23 DIAGNOSIS — E039 Hypothyroidism, unspecified: Secondary | ICD-10-CM | POA: Diagnosis not present

## 2020-11-25 DIAGNOSIS — M5413 Radiculopathy, cervicothoracic region: Secondary | ICD-10-CM | POA: Diagnosis not present

## 2020-11-25 DIAGNOSIS — M9901 Segmental and somatic dysfunction of cervical region: Secondary | ICD-10-CM | POA: Diagnosis not present

## 2020-11-25 NOTE — Progress Notes (Signed)
[facility] Note  11/29/2020     CHIEF COMPLAINT Patient presents for Retina Follow Up   HISTORY OF PRESENT ILLNESS: April [name] is a 50 y.o. female who presents to the clinic today for:   HPI     Retina Follow Up   Patient presents with  Other.  In both eyes.  This started years ago.  Severity is mild.  Duration of 1 year.  Since onset it is stable.  I, the attending physician,  performed the HPI with the patient and updated documentation appropriately.        Comments   50 y/o female pt here for 1 yr ret eval.  Pt still taking Elmiron $RemoveBefore'200mg'kErhMsEUdnmOr$  BID po.  No change in [place] OU.  Denies pain, FOL, floaters.  No gtts.      Last edited by Bernarda Caffey, MD on 11/30/2020 12:36 PM.     pt states she is still on Elmiron $RemoveBe'200mg'[id number]$  BID  Referring physician: Jodi Marble, MD [place],  [place] [address]  HISTORICAL INFORMATION:   Selected notes from the MEDICAL RECORD NUMBER Retinal Eval - Referred by Dr. Brandt Loosen Tejan-Sie Pt has been on urology med, [place], for over 5 yrs.  Eval for potential retinal damage.   CURRENT MEDICATIONS: No current outpatient medications on file. (Ophthalmic Drugs)   No current facility-administered medications for this visit. (Ophthalmic Drugs)   Current Outpatient Medications (Other)  Medication Sig   albuterol (PROVENTIL HFA;VENTOLIN HFA) 108 (90 BASE) MCG/ACT inhaler Inhale 1 puff into the lungs as needed. For shortness of breath or wheezing   Ascorbic Acid (VITA-C PO) Take by mouth.   azelastine (ASTELIN) 0.1 % nasal spray as directed.   Biotin 10000 MCG TABS Take by mouth daily.   butalbital-acetaminophen-caffeine (FIORICET) 50-325-40 MG tablet Take 1 tablet by mouth every 6 (six) hours as needed.   Calcium-Phosphorus-Vitamin D (CALCIUM GUMMIES PO) Take 1 tablet by mouth every evening.   Cholecalciferol (VITAMIN D3) 5000 units TABS Take by mouth daily.   co-enzyme Q-10 30 MG capsule Take 30 mg by  mouth daily.   Cyanocobalamin 5000 MCG SUBL Place 5,000 mcg under the tongue daily.   diazepam (VALIUM) 10 MG tablet Take 10 mg by mouth daily.   dicyclomine (BENTYL) 20 MG tablet Take 20 mg by mouth every 6 (six) hours.   diltiazem (CARDIZEM CD) 120 MG 24 hr capsule TAKE 1 CAPSULE BY MOUTH ONCE DAILY   diltiazem (CARDIZEM) 30 MG tablet Take 1 tablet (30 mg total) by mouth every 8 (eight) hours as needed (for breakthrough palpitations).   ELMIRON 100 MG capsule Take 200 mg by mouth 2 (two) times daily.   fexofenadine (ALLEGRA) 180 MG tablet Take 180 mg by mouth every morning.   fluconazole (DIFLUCAN) 100 MG tablet Take 100 mg by mouth daily.   fluticasone (FLONASE) 50 MCG/ACT nasal spray SHAKE LQ AND U 1 TO 2 SPRAYS IEN QD UTD   Fluticasone-Salmeterol (ADVAIR) 250-50 MCG/DOSE AEPB Inhale 1 puff into the lungs 2 (two) times a day.    furosemide (LASIX) 20 MG tablet Take 20 mg by mouth every morning.   gabapentin (NEURONTIN) 800 MG tablet Take 800 mg by mouth 3 (three) times daily.   guaiFENesin (MUCINEX) 600 MG 12 hr tablet Take 1,200 mg by mouth 2 (two) times daily.   HYDROcodone-acetaminophen (NORCO/VICODIN) 5-325 MG tablet Take 1 tablet by mouth 4 (four) times daily as needed. for pain  hydrOXYzine (ATARAX/VISTARIL) 50 MG tablet Take 50 mg by mouth at bedtime.   hyoscyamine (LEVSIN SL) 0.125 MG SL tablet Place 0.125 mg under the tongue every 4 (four) hours as needed for cramping.   Lactobacillus (ACIDOPHILUS PROBIOTIC PO) Take by mouth 3 (three) times daily.   lidocaine (XYLOCAINE) 5 % ointment Apply topically daily.   methocarbamol (ROBAXIN) 750 MG tablet Take 750 mg by mouth every 8 (eight) hours.   montelukast (SINGULAIR) 10 MG tablet Take 10 mg by mouth at bedtime.   nitrofurantoin (MACRODANTIN) 50 MG capsule Take 50 mg by mouth daily.   Oxcarbazepine (TRILEPTAL) 300 MG tablet Take by mouth.   oxybutynin (DITROPAN) 5 MG tablet Take 5 mg by mouth 3 (three) times daily as needed.    PROCTOZONE-HC 2.5 % rectal cream Apply topically as needed.   Simethicone 125 MG CAPS Take 125 mg by mouth 4 (four) times daily.   TURMERIC PO Take by mouth daily.   valACYclovir (VALTREX) 1000 MG tablet Take 1,000 mg by mouth 3 (three) times daily.   valACYclovir (VALTREX) 500 MG tablet Take 500 mg by mouth 2 (two) times daily.   vitamin k 100 MCG tablet Take 100 mcg by mouth daily.   Zinc 25 MG TABS Take by mouth daily.   Current Facility-Administered Medications (Other)  Medication Route   betamethasone acetate-betamethasone sodium phosphate (CELESTONE) injection 3 mg Intramuscular   REVIEW OF SYSTEMS: ROS   Positive for: Gastrointestinal, Neurological, Musculoskeletal, Cardiovascular, Eyes Negative for: Constitutional, Skin, Genitourinary, HENT, Endocrine, Respiratory, Psychiatric, Allergic/Imm, Heme/Lymph Last edited by Matthew Folks, COA on 11/29/2020  2:10 PM.    ALLERGIES Allergies  Allergen Reactions   Amitriptyline Other (See Comments)    Other reaction(s): Other Change in mental status   Amoxicillin Rash   Bromelains     Other reaction(s): Abdominal pain   Celexa [Citalopram Hydrobromide] Other (See Comments)    Other reaction(s): Other Change in mental status   Ivp Dye [Iodinated Diagnostic Agents] Anaphylaxis    Pt reports "I do not have contrast allergy and have received IVP dye without pre-med with NO reaction."    Nortriptyline     Other reaction(s): Other   Penicillins Swelling and Rash   Pregabalin     Other reaction(s): Myalgias (muscle pain)   Bromelains [Pineapple Extract] Other (See Comments)    Severe pain   Budesonide-Formoterol Fumarate     Other reaction(s): Other   Citalopram Other (See Comments)   Mangifera Indica Swelling   Methylene Blue Other (See Comments)    Caused inflamed pancreas, per pt should not use   Sertraline Other (See Comments)    Other reaction(s): Other Suicidal thoughts   Sulfa Antibiotics Other (See Comments)    Sulfonamide Derivatives Other (See Comments)    excrutiating pain, patient states it caused her to have osteoarthritis   Bromaline Lorretta Harp Bromm] Rash   Other Rash    Mango    PAST MEDICAL HISTORY Past Medical History:  Diagnosis Date   Anxiety 12/14/2011   Asthma    no inhaler   Cataract    Diastolic dysfunction    a. 10/2015 Echo: EF nl, mod diast dysfxn. Mild MR/TR; b. 09/2016 Echo: EF 55-60%, no rwma. Nl RV size and fxn; c. 03/2020 Echo: EF 60-65%. No rwma.   Factor 5 Leiden mutation, heterozygous ([place])    Fibromyalgia    Fracture of 5th metatarsal 03/2011   left foot -     Heart palpitations  a. Notes intermittent tachycardia (sinus). Did not tolerate beta blockers 2/2 urinary incontinence; b. 07/2017 Event monitor: Avg HR 77 (49-136). No significant ectopy/pauses. Triggered events corresponded w/ sinus rhythm, sinus arrhythmias, and artifact.   Interstitial cystitis    Non-cardiac chest pain    a. 12/2015 CTA chest: no PE. No significant coronary Ca2+. Mosaic attenuation pattern in both lungs, may reflect small airway dzs; c. 12/2015 Ex MV: Walked 6 mins. No ischemia/scar; d. 12/2019 Cor CTA: Ca2+ = 0. Nl cors. ? PFO w/o evidence of flow (not seen on echo).   Ovarian cyst    Pancreatitis    Pelvic floor dysfunction    Rheumatoid arteritis ([place])    Sinus infection    chronic   Syncope    a. 08/2017 Event monitor: Average heart rate 77 (49-136).  Periods of sinus arrhythmia noted.  Triggered events corresponded with sinus rhythm, sinus arrhythmia, and artifact.  No significant ectopy, sustained arrhythmias, or prolonged pauses observed.   Thrombosis    a. 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   Past Surgical History:  Procedure Laterality Date   ABDOMINAL HYSTERECTOMY     CHOLECYSTECTOMY     CYSTOSCOPY WITH HYDRODISTENSION AND BIOPSY  04/21/2011   Dr Philis Fendt   LAPAROSCOPIC  ASSISTED VAGINAL HYSTERECTOMY  08/21/2011   Procedure: LAPAROSCOPIC ASSISTED VAGINAL HYSTERECTOMY;  Surgeon: Cyril Mourning, MD;  Location: [facility];  Service: Gynecology;  Laterality: N/A;  OPEN LAPAROSCOPIC   LAPAROSCOPIC NISSEN FUNDOPLICATION  4827   neck ablation Left 12/29/2019   neck ablation Right 01/05/2020   SALPINGOOPHORECTOMY  08/21/2011   Procedure: SALPINGO OOPHERECTOMY;  Surgeon: Cyril Mourning, MD;  Location: [facility];  Service: Gynecology;  Laterality: Bilateral;   TUBAL LIGATION  2010   WISDOM TOOTH EXTRACTION      FAMILY HISTORY Family History  Problem Relation Age of Onset   Heart Problems Mother    Vascular Disease Mother    Skin cancer Mother    Vascular Disease Maternal Grandmother    Kidney Stones Father    Skin cancer Maternal Uncle    Anesthesia problems Neg Hx     SOCIAL HISTORY Social History   Tobacco Use   Smoking status: Never   Smokeless tobacco: Never  Vaping Use   Vaping Use: Never used  Substance Use Topics   Alcohol use: No   Drug use: No       OPHTHALMIC EXAM: Base Eye Exam     Visual Acuity (Snellen - Linear)       Right Left   Dist cc 20/20 - 20/20 -         Tonometry (Tonopen, 2:13 PM)       Right Left   Pressure 14 15         Pupils       Dark Light Shape React APD   Right 4 3 Round Brisk None   Left 4 3 Round Brisk None         Visual Fields (Counting fingers)       Left Right    Full Full         Extraocular Movement       Right Left    Full, Ortho Full, Ortho         Neuro/Psych     Oriented x3: Yes   Mood/Affect: Normal  Dilation     Both eyes: 1.0% Mydriacyl, 2.5% Phenylephrine @ 2:13 PM           Slit Lamp and Fundus Exam     Slit Lamp Exam       Right Left   Lids/Lashes Dermatochalasis - upper lid, Meibomian gland dysfunction Dermatochalasis - upper lid, Meibomian gland dysfunction   Conjunctiva/Sclera White and quiet White and quiet   Cornea Trace Punctate  epithelial erosions greatest inferiorly, no guttata Trace Punctate epithelial erosions greatest inferiorly, no guttata   Anterior Chamber Deep and quiet Deep and quiet   Iris Round and dilated Round and dilated   Lens 2+ Nuclear sclerosis, 1-2+ Cortical cataract 2+ Nuclear sclerosis, 1-2+ Cortical cataract   Vitreous mild Vitreous syneresis mild Vitreous syneresis         Fundus Exam       Right Left   Disc Pink and Sharp, mild tilt, Compact, mild PPP Pink and Sharp, inferior PPP, Compact   C/D Ratio 0.1 0.2   Macula Flat, good foveal reflex, mild RPE mottling and clumping, No heme or edema Flat, good foveal reflex, mild Retinal pigment epithelial mottling and clumping, No heme or edema   Vessels attenuated, mild tortuousity mild attenuation, mild tortuousity   Periphery Attached, mild inferior pavingstone degeneration, No heme  Attached, pigmented pavingstone degeneration inferiorly, No heme             IMAGING AND PROCEDURES  Imaging and Procedures for $RemoveBefore'@TODAY'[id number]$ @  OCT, Retina - OU - Both Eyes       Right Eye Quality was good. Central Foveal Thickness: 273. Progression has been stable. Findings include normal foveal contour, no IRF, no SRF.   Left Eye Quality was good. Central Foveal Thickness: 271. Progression has been stable. Findings include normal foveal contour, no IRF, no SRF, vitreomacular adhesion .   Notes *Images captured and stored on drive  Diagnosis / Impression:  NFP, no IRF/SRF OU  No pigmentary maculopathy or outer retinal atrophy -- no Elmiron toxicity  Clinical management:  See below  Abbreviations: NFP - Normal foveal profile. CME - cystoid macular edema. PED - pigment epithelial detachment. IRF - intraretinal fluid. SRF - subretinal fluid. EZ - ellipsoid zone. ERM - epiretinal membrane. ORA - outer retinal atrophy. ORT - outer retinal tubulation. SRHM - subretinal hyper-reflective material            ASSESSMENT/PLAN:    ICD-10-CM   1. 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 Myopia of both eyes with astigmatism  H52.13    H52.203     3. Combined forms of age-related cataract of both eyes  H25.813       1. No retinal edema on exam or OCT -- stable  - no pigmentary maculopathy or degeneration consistent with Elmiron toxicity  - pt reports taking po Elmiron, 200 mg BID, since 2012  - OCT and autofluorescence without RPE atrophy  - Optos FAF images obtained today 11.1.22  - f/u 1 years, DFE, OCT, Optos FAF  2. Myopia w/ astigmatism OU  3. 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  - discussed diagnosis and progression  - not yet visually significant  - monitor for now   Ophthalmic Meds Ordered this visit:  No orders of the defined types were placed in this encounter.      Return in  about 1 year (around 11/29/2021) for f/u pigmentary maculopathy OU, DFE, OCT.  There are no Patient Instructions on file for this visit.  This document serves as a record of services personally performed by Gardiner Sleeper, MD, PhD. It was created on their behalf by Leeann Must, [place], an ophthalmic technician. The creation of this record is the provider's dictation and/or activities during the visit.    Electronically signed by: Leeann Must, COA $RemoveB'@TODAY'EjrpsYvO$ @ 12:39 PM  This document serves as a record of services personally performed by Gardiner Sleeper, MD, PhD. It was created on their behalf by San Jetty. Owens Shark, OA an ophthalmic technician. The creation of this record is the provider's dictation and/or activities during the visit.    Electronically signed by: San Jetty. Owens Shark, [place] 10.31.2022 12:39 PM   Gardiner Sleeper, M.D., Ph.D. Diseases & Surgery of the Retina and [place] 11/29/2020   I have reviewed the above documentation for accuracy and completeness, and I agree with the above. Gardiner Sleeper, M.D., Ph.D. 11/30/20 12:39 PM   Abbreviations: M myopia (nearsighted); A astigmatism; H hyperopia (farsighted); P presbyopia; Mrx spectacle prescription;  CTL contact lenses; OD right eye; OS left eye; OU both eyes  XT exotropia; ET esotropia; PEK punctate epithelial keratitis; PEE punctate epithelial erosions; DES dry eye syndrome; MGD meibomian gland dysfunction; ATs artificial tears; PFAT's preservative free artificial tears; [place] nuclear sclerotic cataract; PSC posterior subcapsular cataract; ERM epi-retinal membrane; PVD posterior vitreous detachment; RD retinal detachment; DM diabetes mellitus; DR diabetic retinopathy; NPDR non-proliferative diabetic retinopathy; PDR proliferative diabetic retinopathy; CSME clinically significant macular edema; DME diabetic macular edema; dbh dot blot hemorrhages; CWS cotton wool spot; POAG primary open angle glaucoma; C/D cup-to-disc ratio; HVF humphrey visual field; GVF goldmann visual field; OCT optical coherence tomography; IOP intraocular pressure; BRVO Branch retinal vein occlusion; CRVO central retinal vein occlusion; CRAO central retinal artery occlusion; BRAO branch retinal artery occlusion; RT retinal tear; SB scleral buckle; PPV pars plana vitrectomy; VH Vitreous hemorrhage; PRP panretinal laser photocoagulation; IVK intravitreal kenalog; VMT vitreomacular traction; MH Macular hole;  NVD neovascularization of the disc; NVE neovascularization elsewhere; AREDS age related eye disease study; ARMD age related macular degeneration; POAG primary open angle glaucoma; EBMD epithelial/anterior basement membrane dystrophy; ACIOL anterior chamber intraocular lens; IOL intraocular lens; PCIOL posterior chamber intraocular lens; Phaco/IOL phacoemulsification with intraocular lens placement; [place] photorefractive keratectomy; LASIK laser assisted in situ keratomileusis; HTN hypertension; DM diabetes mellitus; COPD chronic obstructive pulmonary disease

## 2020-11-29 ENCOUNTER — Ambulatory Visit (INDEPENDENT_AMBULATORY_CARE_PROVIDER_SITE_OTHER): Payer: Medicare Other | Admitting: Ophthalmology

## 2020-11-29 ENCOUNTER — Other Ambulatory Visit: Payer: Self-pay

## 2020-11-29 ENCOUNTER — Encounter (INDEPENDENT_AMBULATORY_CARE_PROVIDER_SITE_OTHER): Payer: Self-pay | Admitting: Ophthalmology

## 2020-11-29 DIAGNOSIS — H25813 Combined forms of age-related cataract, bilateral: Secondary | ICD-10-CM | POA: Diagnosis not present

## 2020-11-29 DIAGNOSIS — H52203 Unspecified astigmatism, bilateral: Secondary | ICD-10-CM

## 2020-11-29 DIAGNOSIS — H3581 Retinal edema: Secondary | ICD-10-CM | POA: Diagnosis not present

## 2020-11-29 DIAGNOSIS — H5213 Myopia, bilateral: Secondary | ICD-10-CM

## 2020-11-30 ENCOUNTER — Encounter (INDEPENDENT_AMBULATORY_CARE_PROVIDER_SITE_OTHER): Payer: Self-pay | Admitting: Ophthalmology

## 2020-12-06 DIAGNOSIS — I951 Orthostatic hypotension: Secondary | ICD-10-CM | POA: Diagnosis not present

## 2020-12-06 DIAGNOSIS — B0229 Other postherpetic nervous system involvement: Secondary | ICD-10-CM | POA: Diagnosis not present

## 2020-12-07 ENCOUNTER — Telehealth: Payer: Self-pay

## 2020-12-07 NOTE — Telephone Encounter (Signed)
SCANNED NOTES TO REFERRAL

## 2020-12-31 ENCOUNTER — Telehealth: Payer: Self-pay | Admitting: Internal Medicine

## 2020-12-31 NOTE — Telephone Encounter (Signed)
Pt c/o BP issue: STAT if pt c/o blurred vision, one-sided weakness or slurred speech  1. What are your last 5 BP readings?   159/108   97  130/89  85  135/84  89  104/99  96     2. Are you having any other symptoms (ex. Dizziness, headache, blurred vision, passed out)? Fever Headaches spine issues cranial shingles   3. What is your BP issue? Patient given trileptal for shingles and wants to know if this could be causing bp issues

## 2021-01-04 NOTE — Telephone Encounter (Signed)
Spoke with pt.  She is currently being treated for shingles and concerned with elevated BP.  Discussed readings below and advised pt she will have fluctuations in BP as pain response.  Advised pt to continue to monitor BP, may check daily at same time ~2 hrs after morning medication.  Pt to keep log and contact if BP is consistently >140/90.  Advised pt to contact her provider that is managing shingles to discuss pain management.  BP this morning while on phone with pt 133/90 HR 88.  Pt denies any blurred visino, HA this AM or any other associated symptoms.  Pt voiced understanding and has no further questions.

## 2021-01-06 DIAGNOSIS — G894 Chronic pain syndrome: Secondary | ICD-10-CM | POA: Diagnosis not present

## 2021-01-06 DIAGNOSIS — M2550 Pain in unspecified joint: Secondary | ICD-10-CM | POA: Diagnosis not present

## 2021-01-06 DIAGNOSIS — M791 Myalgia, unspecified site: Secondary | ICD-10-CM | POA: Diagnosis not present

## 2021-01-06 DIAGNOSIS — Z79899 Other long term (current) drug therapy: Secondary | ICD-10-CM | POA: Diagnosis not present

## 2021-01-06 DIAGNOSIS — M542 Cervicalgia: Secondary | ICD-10-CM | POA: Diagnosis not present

## 2021-01-11 ENCOUNTER — Ambulatory Visit (INDEPENDENT_AMBULATORY_CARE_PROVIDER_SITE_OTHER): Payer: Medicare Other | Admitting: Internal Medicine

## 2021-01-11 ENCOUNTER — Encounter: Payer: Self-pay | Admitting: Internal Medicine

## 2021-01-11 ENCOUNTER — Other Ambulatory Visit: Payer: Self-pay

## 2021-01-11 VITALS — BP 160/90 | HR 78 | Ht 67.0 in | Wt 172.0 lb

## 2021-01-11 DIAGNOSIS — R55 Syncope and collapse: Secondary | ICD-10-CM

## 2021-01-11 DIAGNOSIS — R002 Palpitations: Secondary | ICD-10-CM

## 2021-01-11 NOTE — Progress Notes (Signed)
ELECTROPHYSIOLOGY CONSULT NOTE  Patient ID: April [name], MRN: [id number], DOB/AGE: 1970/05/05 50 y.o. Admit date: (Not on file) Date of Consult: 01/11/2021  Primary Physician: Jodi Marble, MD Primary Cardiologist: CE   HPI April [name] is a 50 y.o. female who when I asked her why she was here, she said first it was her heart rate that went up and down, then she said it was her blood pressure that went up and down, then she said it was to see if she had POTS.    She has a complex past history which includes interstitial cystitis, irritable bowel disease with chronic diarrhea, pelvic floor dysfunction, prior Nissen fundoplication, implantation of a Medtronic device for control of incontinence that was complicated by an infection requiring generator explantation with the leads being left behind.  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  She describes having been evaluated for low-grade fevers over the last 13 years.  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  There are no documented heart rates in the chart of faster than 90 bpm.  She describes also being told that her heart rate was always faster than 100 and has been on diltiazem for this for 5 or 10 years.  She was told that if her heart rate did not slow down it would kill her.  Heart rates are as noted above.  Regarding her blood pressure, she says that when she wears her compression stockings her blood pressure goes down.  She is concerned  that taking diltiazem is not addressing her blood pressure adequately because of chronic diarrhea.  Episodes of LH and presyncope assoc with tachypalpitations, but event recorder LH--assoc with sinus rhythm    Who is being seen today for the evaluation of EKG and POTS  at the request of Dr.Grewal MD. They have a Hx of asthma, anxiety, diastolic dysfunction, heart palpitations, and Non-cardiac chest pain.    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  She also reports having undergone some kind of salivary endocrine testing prompting the need for a series of supplements. Earliest was without fifth  Past Medical History:  Diagnosis Date   Anxiety 12/14/2011   Asthma    no inhaler   Cataract    Diastolic dysfunction    a. 10/2015 Echo: EF nl, mod diast dysfxn. Mild MR/TR; b. 09/2016 Echo: EF 55-60%, no rwma. Nl RV size and fxn; c. 03/2020 Echo: EF 60-65%. No rwma.   Factor 5 Leiden mutation, heterozygous ([place])    Fibromyalgia    Fracture of 5th metatarsal 03/2011   left foot -  Heart palpitations    a. Notes intermittent tachycardia (sinus). Did not tolerate beta blockers 2/2 urinary incontinence; b. 07/2017 Event monitor: Avg HR 77 (49-136). No significant ectopy/pauses. Triggered events corresponded w/ sinus rhythm, sinus arrhythmias, and artifact.   Interstitial cystitis    Non-cardiac chest pain    a. 12/2015 CTA chest: no PE. No significant coronary Ca2+. Mosaic attenuation pattern in both lungs, may reflect small airway dzs; c. 12/2015 Ex MV: Walked 6 mins. No ischemia/scar; d. 12/2019 Cor CTA: Ca2+ = 0. Nl cors. ? PFO w/o evidence of flow (not seen on echo).   Ovarian cyst    Pancreatitis    Pelvic floor dysfunction    Rheumatoid arteritis ([place])    Sinus infection    chronic   Syncope    a. 08/2017 Event monitor: Average heart rate 77  (49-136).  Periods of sinus arrhythmia noted.  Triggered events corresponded with sinus rhythm, sinus arrhythmia, and artifact.  No significant ectopy, sustained arrhythmias, or prolonged pauses observed.   Thrombosis    a. 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      Surgical History:  Past Surgical History:  Procedure Laterality Date   ABDOMINAL HYSTERECTOMY     CHOLECYSTECTOMY     CYSTOSCOPY WITH HYDRODISTENSION AND BIOPSY  04/21/2011   Dr Philis Fendt   LAPAROSCOPIC ASSISTED VAGINAL HYSTERECTOMY  08/21/2011   Procedure: LAPAROSCOPIC ASSISTED VAGINAL HYSTERECTOMY;  Surgeon: Cyril Mourning, MD;  Location: [facility];  Service: Gynecology;  Laterality: N/A;  OPEN LAPAROSCOPIC   LAPAROSCOPIC NISSEN FUNDOPLICATION  8921   neck ablation Left 12/29/2019   neck ablation Right 01/05/2020   SALPINGOOPHORECTOMY  08/21/2011   Procedure: SALPINGO OOPHERECTOMY;  Surgeon: Cyril Mourning, MD;  Location: [facility];  Service: Gynecology;  Laterality: Bilateral;   TUBAL LIGATION  2010   WISDOM TOOTH EXTRACTION       Home Meds: Current Meds  Medication Sig   albuterol (PROVENTIL HFA;VENTOLIN HFA) 108 (90 BASE) MCG/ACT inhaler Inhale 1 puff into the lungs as needed. For shortness of breath or wheezing   Ascorbic Acid (VITA-C PO) Take by mouth.   azelastine (ASTELIN) 0.1 % nasal spray as directed.   Biotin 10000 MCG TABS Take by mouth daily.   butalbital-acetaminophen-caffeine (FIORICET) 50-325-40 MG tablet Take 1 tablet by mouth every 6 (six) hours as needed.   Calcium-Phosphorus-Vitamin D (CALCIUM GUMMIES PO) Take 1 tablet by mouth every evening.   Cholecalciferol (VITAMIN D3) 5000 units TABS Take by mouth daily.   co-enzyme Q-10 30 MG capsule Take 30 mg by mouth daily.   Cyanocobalamin 5000 MCG SUBL Place 5,000 mcg under the tongue daily.   diazepam (VALIUM) 10 MG tablet Take 10 mg by mouth daily.   dicyclomine  (BENTYL) 20 MG tablet Take 20 mg by mouth every 6 (six) hours.   diltiazem (CARDIZEM CD) 120 MG 24 hr capsule TAKE 1 CAPSULE BY MOUTH ONCE DAILY   ELMIRON 100 MG capsule Take 200 mg by mouth 2 (two) times daily.   fexofenadine (ALLEGRA) 180 MG tablet Take 180 mg by mouth every morning.   fluconazole (DIFLUCAN) 100 MG tablet Take 100 mg by mouth daily.   fluticasone (FLONASE) 50 MCG/ACT nasal spray SHAKE LQ AND U 1 TO 2 SPRAYS IEN QD UTD   Fluticasone-Salmeterol (ADVAIR) 250-50 MCG/DOSE AEPB Inhale 1 puff into the lungs 2 (two) times a day.    furosemide (  LASIX) 20 MG tablet Take 20 mg by mouth every morning.   gabapentin (NEURONTIN) 800 MG tablet Take 800 mg by mouth 3 (three) times daily.   guaiFENesin (MUCINEX) 600 MG 12 hr tablet Take 1,200 mg by mouth 2 (two) times daily.   hydrOXYzine (ATARAX/VISTARIL) 50 MG tablet Take 50 mg by mouth at bedtime.   hyoscyamine (LEVSIN SL) 0.125 MG SL tablet Place 0.125 mg under the tongue every 4 (four) hours as needed for cramping.   Lactobacillus (ACIDOPHILUS PROBIOTIC PO) Take by mouth 3 (three) times daily.   lidocaine (XYLOCAINE) 5 % ointment Apply topically daily.   methocarbamol (ROBAXIN) 750 MG tablet Take 750 mg by mouth every 8 (eight) hours.   montelukast (SINGULAIR) 10 MG tablet Take 10 mg by mouth at bedtime.   nitrofurantoin (MACRODANTIN) 50 MG capsule Take 50 mg by mouth daily.   Oxcarbazepine (TRILEPTAL) 300 MG tablet Take by mouth.   oxybutynin (DITROPAN) 5 MG tablet Take 5 mg by mouth 3 (three) times daily as needed.   PROCTOZONE-HC 2.5 % rectal cream Apply topically as needed.   Simethicone 125 MG CAPS Take 125 mg by mouth 4 (four) times daily.   TURMERIC PO Take by mouth daily.   valACYclovir (VALTREX) 1000 MG tablet Take 1,000 mg by mouth 3 (three) times daily.   valACYclovir (VALTREX) 500 MG tablet Take 500 mg by mouth 2 (two) times daily.   vitamin k 100 MCG tablet Take 100 mcg by mouth daily.   Zinc 25 MG TABS Take by mouth  daily.   Current Facility-Administered Medications for the 01/11/21 encounter (Office Visit) with Deboraha Sprang, MD  Medication   betamethasone acetate-betamethasone sodium phosphate (CELESTONE) injection 3 mg    Allergies:  Allergies  Allergen Reactions   Amitriptyline Other (See Comments)    Other reaction(s): Other Change in mental status   Amoxicillin Rash   Bromelains     Other reaction(s): Abdominal pain   Celexa [Citalopram Hydrobromide] Other (See Comments)    Other reaction(s): Other Change in mental status   Ivp Dye [Iodinated Diagnostic Agents] Anaphylaxis    Pt reports "I do not have contrast allergy and have received IVP dye without pre-med with NO reaction."    Nortriptyline     Other reaction(s): Other   Penicillins Swelling and Rash   Pregabalin     Other reaction(s): Myalgias (muscle pain)   Bromelains [Pineapple Extract] Other (See Comments)    Severe pain   Budesonide-Formoterol Fumarate     Other reaction(s): Other   Citalopram Other (See Comments)   Mangifera Indica Swelling   Methylene Blue Other (See Comments)    Caused inflamed pancreas, per pt should not use   Sertraline Other (See Comments)    Other reaction(s): Other Suicidal thoughts   Sulfa Antibiotics Other (See Comments)   Sulfonamide Derivatives Other (See Comments)    excrutiating pain, patient states it caused her to have osteoarthritis   Bromaline Lorretta Harp Bromm] Rash   Other Rash    Mango    Social History   Socioeconomic History   Marital status: Married    Spouse name: Not on file   Number of children: Not on file   Years of education: Not on file   Highest education level: Not on file  Occupational History   Not on file  Tobacco Use   Smoking status: Never   Smokeless tobacco: Never  Vaping Use   Vaping Use: Never used  Substance and Sexual Activity  Alcohol use: No   Drug use: No   Sexual activity: Yes    Birth control/protection: Surgical  Other Topics  Concern   Not on file  Social History Narrative   Not on file   Social Determinants of Health   Financial Resource Strain: Not on file  Food Insecurity: Not on file  Transportation Needs: Not on file  Physical Activity: Not on file  Stress: Not on file  Social Connections: Not on file  Intimate Partner Violence: Not on file     Family History  Problem Relation Age of Onset   Heart Problems Mother    Vascular Disease Mother    Skin cancer Mother    Vascular Disease Maternal Grandmother    Kidney Stones Father    Skin cancer Maternal Uncle    Anesthesia problems Neg Hx     Her father and grandfather have had history of headaches.  ROS:  Please see the history of present illness.     All other systems reviewed and negative.   (+) Migraines/Headaches (+) Dizziness (+) Palpitations (+) Chest Discomfort (+) Syncope (+) Fever (Low Grade) (+) Lightheadedness (+) Diarrhea  (+) LE Edema   Physical Exam: Blood pressure (!) 160/90, pulse 78, height 5\' 7"  (1.702 m), weight 172 lb (78 kg), last menstrual period 05/20/2011, SpO2 98 %. General: Well developed, well nourished female in no acute distress. Head: Normocephalic, atraumatic, sclera non-icteric, no xanthomas, nares are without discharge. EENT: normal  Lymph Nodes:  none Neck: Negative for carotid bruits. . Back:without scoliosis kyphosis  Lungs: Clear bilaterally   Heart: RRR   Abdomen: Soft, non-tender, non-distended with normoactive bowel sounds. No hepatomegaly. No rebound/guarding. No obvious abdominal masses. Msk:  Strength and tone appear normal for age. Extremities: No clubbing or cyanosis. No edema.  Distal pedal pulses are 2+ and equal bilaterally. Skin: Warm and Dry Neuro: Alert and oriented X 3. CN III-XII intact Grossly normal sensory and motor function . Psych:  Responds to questions appropriately with a normal affect.      Labs: Cardiac Enzymes No results for input(s): CKTOTAL, CKMB, TROPONINI in  the last 72 hours. CBC Lab Results  Component Value Date   WBC 8.0 05/12/2020   HGB 13.8 05/12/2020   HCT 40.4 05/12/2020   MCV 98.1 05/12/2020   PLT 288 05/12/2020   PROTIME: No results for input(s): LABPROT, INR in the last 72 hours. Chemistry No results for input(s): NA, K, CL, CO2, BUN, CREATININE, CALCIUM, PROT, BILITOT, ALKPHOS, ALT, AST, GLUCOSE in the last 168 hours.  Invalid input(s): LABALBU Lipids No results found for: CHOL, HDL, LDLCALC, TRIG BNP Pro B Natriuretic peptide (BNP)  Date/Time Value Ref Range Status  07/05/2012 11:41 AM 9.0 0.0 - 100.0 pg/mL Final   Thyroid Function Tests: No results for input(s): TSH, T4TOTAL, T3FREE, THYROIDAB in the last 72 hours.  Invalid input(s): FREET3 Miscellaneous No results found for: DDIMER  Radiology/Studies:  No results found.  EKG: Sinus at 78 Intervals 01/08/1934 without evidence of ventricular preexcitation   Assessment and Plan:  Tachy palpitations without documented tachycardia  Hypertension  Lightheadedness and presyncope   The patient's presentation was confusing; it was not clear to me what it was that her concern wants or that of her referring physician.  As noted above, she initially described issues of heart rates and then of blood pressure and then said that the question that had prompted the referral was whether she had POTS.  The latter was an easy question  as her orthostatic vital signs and her medical record showed no evidence of tachycardia.  The issue of her palpitations is difficult as there is no documented tachycardia.  I suggested that she use an AliveCor monitor.  The event recorder that she used 2019 was reviewed and demonstrated no significant arrhythmia.  Her blood pressure is elevated.  She was reluctant to take more oral medications as she thinks that she does not observe because of her diarrhea.  I suggested she talk to her primary care physician about using transcutaneous  clonidine.  She has a stimulator implanted for bladder and bowel incontinence.  The story of its removal because of infection and the implantation of a new generator attached to the old wires in the context of my experience with pacemaker devices is concerning as the wires themselves can be infected as well.  I have no idea of how these devices are implanted and what these risks are but I have suggested that she speak to Dr. Amalia Hailey to make sure that there is not an issue.  Her lightheadedness and presyncope are associated with the palpitations.  They are nonpositional.  It is possible that they are neurally mediated reflex is associated with tachycardia yet to be elucidated. Event recorder was assoc with sinus rhythm. ( Although no syncope)  Hopefully the AliveCor monitor will do just that.  She will follow-up with general cardiology and I will be glad to see her again if data or concerns emerge that perhaps I can help address              I,Tinashe Williams,acting as a scribe for Virl Axe, MD.,have documented all relevant documentation on the behalf of Virl Axe, MD,as directed by  Virl Axe, MD while in the presence of Virl Axe, MD.   I, Virl Axe, MD, have reviewed all documentation for this visit. The documentation on 01/11/21 for the exam, diagnosis, procedures, and orders are all accurate and complete.

## 2021-01-11 NOTE — Patient Instructions (Signed)
Medication Instructions:  Your physician recommends that you continue on your current medications as directed. Please refer to the Current Medication list given to you today.  *If you need a refill on your cardiac medications before your next appointment, please call your pharmacy*   Lab Work: None ordered.  If you have labs (blood work) drawn today and your tests are completely normal, you will receive your results only by: [place] (if you have MyChart) OR A paper copy in the mail If you have any lab test that is abnormal or we need to change your treatment, we will call you to review the results.   Testing/Procedures: None ordered.    Follow-Up: At [facility], you and your health needs are our priority.  As part of our continuing mission to provide you with exceptional heart care, we have created designated Provider Care Teams.  These Care Teams include your primary Cardiologist (physician) and Advanced Practice Providers (APPs -  Physician Assistants and Nurse Practitioners) who all work together to provide you with the care you need, when you need it.  We recommend signing up for the patient portal called "MyChart".  Sign up information is provided on this After Visit Summary.  MyChart is used to connect with patients for Virtual Visits (Telemedicine).  Patients are able to view lab/test results, encounter notes, upcoming appointments, etc.  Non-urgent messages can be sent to your provider as well.   To learn more about what you can do with MyChart, go to [email or web address].    Your next appointment:   Follow as needed with Dr Caryl Comes :1}

## 2021-01-13 ENCOUNTER — Telehealth: Payer: Self-pay | Admitting: Internal Medicine

## 2021-01-13 NOTE — Telephone Encounter (Signed)
° °  Pt c/o BP issue: STAT if pt c/o blurred vision, one-sided weakness or slurred speech  1. What are your last 5 BP readings? 162/106   2. Are you having any other symptoms (ex. Dizziness, headache, blurred vision, passed out)? Nauseated falling feeling   3. What is your BP issue? Patient reports conversation about med changes with [facility] visit but was not given new meds or dose change refills   Please advise .  Patient states consult did not go well.

## 2021-01-14 NOTE — Telephone Encounter (Signed)
Spoke with pt.  Difficult to follow conversation with pt.  Pt does report BP yesterday at ~4PM 162/106.  Pt denies blurred vision, does have dizziness at times and headache, but not present at this time.  Pt has been seen in our office with plan to follow up as needed.  Pt did see Dr. Caryl Comes 01/11/21 to r/o POTS, tachycardia, palpitations, light-headedness.  Pt to also follow up as needed per this visit.  Per Dr. Olin Pia note regarding elevated blood pressure pt was advised to speak with her PCP about transcutaneous clonidine.   I reviewed this with the pt and she states she has a follow up visit with her PCP Monday 12/19.  I advised pt speak with PCP regarding clonidine.  Pt voiced understanding and agrees to do this. Pt has no further questions at this time.

## 2021-01-17 ENCOUNTER — Institutional Professional Consult (permissible substitution): Payer: Medicare Other | Admitting: Internal Medicine

## 2021-01-17 DIAGNOSIS — M9901 Segmental and somatic dysfunction of cervical region: Secondary | ICD-10-CM | POA: Diagnosis not present

## 2021-01-17 DIAGNOSIS — M5413 Radiculopathy, cervicothoracic region: Secondary | ICD-10-CM | POA: Diagnosis not present

## 2021-01-17 NOTE — Telephone Encounter (Signed)
Returned patient's call. No answer. Lmtcb

## 2021-01-17 NOTE — Telephone Encounter (Signed)
Patient calling  Still having BP issues States PCP told her to follow up with cardio to help with BP  Please call to discuss

## 2021-01-19 NOTE — Telephone Encounter (Signed)
Called patients primary care provider to verify if they have seen patient recently. [place] office staff reports she was scheduled appointment with provider but did not have her labs prior as scheduled and therefore unable to keep appt until she reschedules her labs and appointment. She reports patient declined rescheduling at that time but they will certainly help manage her blood pressures. Will reach out to patient to discuss her concerns and options for her care.

## 2021-01-19 NOTE — Telephone Encounter (Signed)
Spoke with patient to review her concerns. She states that when she came in to see provider she did not have a good experience. Patient reports that nurse did not come in prior to provider for review of her symptoms. She then states provider did not allow her to speak and explain her situation. She was very difficult to follow and spoke about shingles, pain, elevated heart rates, headaches, blood pressures, then restaurants, organic foods, and decrease in appetite. She explained her headaches, breakthrough palpitations, and out of medications for those episodes. She then reviewed lump on her chest which is painful.  Then conversation was about night sweats, hormones, family history, weight loss, and it was very difficult determining what she is requesting. Reviewed testing she has had so far with [place] was negative.   She did ask for refill of diltiazem 30 mg for her breakthrough palpitations. Reviewed that I would need to check with provider before sending that in.   Advised patient to call her primary care provider for her other concerns. Encouraged her to reschedule appointment with PCP but she is now considering changing providers.   Then she starting talking about money, Christmas, and then started talking about appointment with their office. She reports the only appointment they offered at PCP was when she was to visit her mother. Then started describing her various specialist. Orthopedics, neurology, urology, oncology, and after she went on for over 28 minutes then redirected her.   Discussed that from our conversation the only thing she is needing from [place] is a refill of her diltiazem for breakthrough palpitations. Reviewed that all other concerns need to be addressed with her PCP. Advised that if provider is agreeable to refill then I would send that in and her pharmacy should reach out when prescription is available for pick up. No further needs at this time. Total time on phone with patient was one  hour.

## 2021-01-20 DIAGNOSIS — M5413 Radiculopathy, cervicothoracic region: Secondary | ICD-10-CM | POA: Diagnosis not present

## 2021-01-20 DIAGNOSIS — M9901 Segmental and somatic dysfunction of cervical region: Secondary | ICD-10-CM | POA: Diagnosis not present

## 2021-02-03 DIAGNOSIS — L810 Postinflammatory hyperpigmentation: Secondary | ICD-10-CM | POA: Diagnosis not present

## 2021-02-03 DIAGNOSIS — L814 Other melanin hyperpigmentation: Secondary | ICD-10-CM | POA: Diagnosis not present

## 2021-02-03 DIAGNOSIS — D225 Melanocytic nevi of trunk: Secondary | ICD-10-CM | POA: Diagnosis not present

## 2021-02-03 DIAGNOSIS — D2272 Melanocytic nevi of left lower limb, including hip: Secondary | ICD-10-CM | POA: Diagnosis not present

## 2021-02-07 DIAGNOSIS — N3010 Interstitial cystitis (chronic) without hematuria: Secondary | ICD-10-CM | POA: Diagnosis not present

## 2021-02-07 DIAGNOSIS — N3289 Other specified disorders of bladder: Secondary | ICD-10-CM | POA: Diagnosis not present

## 2021-02-07 DIAGNOSIS — R350 Frequency of micturition: Secondary | ICD-10-CM | POA: Diagnosis not present

## 2021-03-29 ENCOUNTER — Institutional Professional Consult (permissible substitution): Payer: Medicare Other | Admitting: Internal Medicine

## 2021-04-05 DIAGNOSIS — M9901 Segmental and somatic dysfunction of cervical region: Secondary | ICD-10-CM | POA: Diagnosis not present

## 2021-04-05 DIAGNOSIS — M5413 Radiculopathy, cervicothoracic region: Secondary | ICD-10-CM | POA: Diagnosis not present

## 2021-04-11 DIAGNOSIS — M9901 Segmental and somatic dysfunction of cervical region: Secondary | ICD-10-CM | POA: Diagnosis not present

## 2021-04-11 DIAGNOSIS — M5413 Radiculopathy, cervicothoracic region: Secondary | ICD-10-CM | POA: Diagnosis not present

## 2021-04-22 DIAGNOSIS — M533 Sacrococcygeal disorders, not elsewhere classified: Secondary | ICD-10-CM | POA: Diagnosis not present

## 2021-04-22 DIAGNOSIS — Z4549 Encounter for adjustment and management of other implanted nervous system device: Secondary | ICD-10-CM | POA: Diagnosis not present

## 2021-04-28 DIAGNOSIS — M9901 Segmental and somatic dysfunction of cervical region: Secondary | ICD-10-CM | POA: Diagnosis not present

## 2021-04-28 DIAGNOSIS — M5413 Radiculopathy, cervicothoracic region: Secondary | ICD-10-CM | POA: Diagnosis not present

## 2021-06-05 ENCOUNTER — Emergency Department (HOSPITAL_COMMUNITY): Payer: Medicare Other

## 2021-06-05 ENCOUNTER — Emergency Department (HOSPITAL_COMMUNITY)
Admission: EM | Admit: 2021-06-05 | Discharge: 2021-06-05 | Disposition: A | Discharge status: Home / Self Care | Payer: Medicare Other | Attending: Emergency Medicine | Admitting: Emergency Medicine

## 2021-06-05 ENCOUNTER — Other Ambulatory Visit: Payer: Self-pay

## 2021-06-05 ENCOUNTER — Encounter (HOSPITAL_COMMUNITY): Payer: Self-pay | Admitting: *Deleted

## 2021-06-05 DIAGNOSIS — R002 Palpitations: Secondary | ICD-10-CM

## 2021-06-05 DIAGNOSIS — R079 Chest pain, unspecified: Secondary | ICD-10-CM | POA: Diagnosis not present

## 2021-06-05 DIAGNOSIS — R030 Elevated blood-pressure reading, without diagnosis of hypertension: Secondary | ICD-10-CM | POA: Diagnosis not present

## 2021-06-05 DIAGNOSIS — R0789 Other chest pain: Secondary | ICD-10-CM | POA: Diagnosis not present

## 2021-06-05 DIAGNOSIS — E876 Hypokalemia: Secondary | ICD-10-CM | POA: Insufficient documentation

## 2021-06-05 DIAGNOSIS — E871 Hypo-osmolality and hyponatremia: Secondary | ICD-10-CM | POA: Diagnosis not present

## 2021-06-05 DIAGNOSIS — R519 Headache, unspecified: Secondary | ICD-10-CM | POA: Diagnosis present

## 2021-06-05 LAB — URINALYSIS, ROUTINE W REFLEX MICROSCOPIC
Bacteria, UA: NONE SEEN
Bilirubin Urine: NEGATIVE
Glucose, UA: NEGATIVE mg/dL
Hgb urine dipstick: NEGATIVE
Ketones, ur: NEGATIVE mg/dL
Leukocytes,Ua: NEGATIVE
Nitrite: NEGATIVE
Protein, ur: NEGATIVE mg/dL
Specific Gravity, Urine: 1.002 — ABNORMAL LOW (ref 1.005–1.030)
pH: 6 (ref 5.0–8.0)

## 2021-06-05 LAB — COMPREHENSIVE METABOLIC PANEL
ALT: 24 U/L (ref 0–44)
AST: 23 U/L (ref 15–41)
Albumin: 4.5 g/dL (ref 3.5–5.0)
Alkaline Phosphatase: 28 U/L — ABNORMAL LOW (ref 38–126)
Anion gap: 10 (ref 5–15)
BUN: <5 mg/dL — ABNORMAL LOW (ref 6–20)
CO2: 21 mmol/L — ABNORMAL LOW (ref 22–32)
Calcium: 9.3 mg/dL (ref 8.9–10.3)
Chloride: 97 mmol/L — ABNORMAL LOW (ref 98–111)
Creatinine, Ser: 0.66 mg/dL (ref 0.44–1.00)
GFR, Estimated: >60 mL/min (ref >60)
Glucose, Bld: 65 mg/dL — ABNORMAL LOW (ref 70–99)
Potassium: 3.3 mmol/L — ABNORMAL LOW (ref 3.5–5.1)
Sodium: 128 mmol/L — ABNORMAL LOW (ref 135–145)
Total Bilirubin: 0.6 mg/dL (ref 0.3–1.2)
Total Protein: 7.1 g/dL (ref 6.5–8.1)

## 2021-06-05 LAB — CBC
HCT: 37.8 % (ref 36.0–46.0)
Hemoglobin: 13 g/dL (ref 12.0–15.0)
MCH: 33.2 pg (ref 26.0–34.0)
MCHC: 34.4 g/dL (ref 30.0–36.0)
MCV: 96.7 fL (ref 80.0–100.0)
Platelets: 268 10*3/uL (ref 150–400)
RBC: 3.91 MIL/uL (ref 3.87–5.11)
RDW: 11.7 % (ref 11.5–15.5)
WBC: 9.4 10*3/uL (ref 4.0–10.5)
nRBC: 0 % (ref 0.0–0.2)

## 2021-06-05 LAB — LIPASE, BLOOD: Lipase: 39 U/L (ref 11–51)

## 2021-06-05 LAB — TROPONIN I (HIGH SENSITIVITY)
Troponin I (High Sensitivity): 2 ng/L (ref <18)
Troponin I (High Sensitivity): <2 ng/L (ref <18)

## 2021-06-05 LAB — CBG MONITORING, ED: Glucose-Capillary: 87 mg/dL (ref 70–99)

## 2021-06-05 LAB — D-DIMER, QUANTITATIVE: D-Dimer, Quant: <0.27 ug/mL-FEU (ref 0.00–0.50)

## 2021-06-05 MED ORDER — PROCHLORPERAZINE EDISYLATE 10 MG/2ML IJ SOLN
10.0000 mg | Freq: Once | INTRAMUSCULAR | Status: AC
  Administered 2021-06-05: 10 mg via INTRAVENOUS
  Filled 2021-06-05: qty 2

## 2021-06-05 MED ORDER — SODIUM CHLORIDE 0.9 % IV BOLUS (SEPSIS)
1000.0000 mL | Freq: Once | INTRAVENOUS | Status: AC
Start: 2021-06-05 — End: 2021-06-05
  Administered 2021-06-05: 1000 mL via INTRAVENOUS

## 2021-06-05 MED ORDER — KETOROLAC TROMETHAMINE 30 MG/ML IJ SOLN
15.0000 mg | Freq: Once | INTRAMUSCULAR | Status: AC
  Administered 2021-06-05: 15 mg via INTRAVENOUS
  Filled 2021-06-05: qty 1

## 2021-06-05 MED ORDER — ASPIRIN 81 MG PO CHEW
324.0000 mg | CHEWABLE_TABLET | Freq: Once | ORAL | Status: DC
  Filled 2021-06-05: qty 4

## 2021-06-05 MED ORDER — SODIUM CHLORIDE 0.9 % IV SOLN: 1000.0000 mL | INTRAVENOUS | Status: DC

## 2021-06-05 MED ORDER — DEXTROSE 10 % IV SOLN: INTRAVENOUS | Status: AC

## 2021-06-05 MED ORDER — POTASSIUM CHLORIDE CRYS ER 20 MEQ PO TBCR
40.0000 meq | EXTENDED_RELEASE_TABLET | Freq: Once | ORAL | Status: AC
Start: 2021-06-05 — End: 2021-06-05
  Administered 2021-06-05: 40 meq via ORAL
  Filled 2021-06-05: qty 2

## 2021-06-05 NOTE — Discharge Instructions (Signed)
Make sure to drink plenty of fluids.  Follow-up with your doctor next week to have your electrolytes rechecked.  Return as needed for fevers worsening symptoms. ?

## 2021-06-05 NOTE — ED Provider Notes (Signed)
?[facility] ?Provider Note ? ? ?CSN: [id number] ?Arrival date & time: 06/05/21  1835 ? ?  ? ?History ? ?Chief complaint chest pain, palpitations ? ?April [name] is a 51 y.o. female. ? ?HPI ? ?Patient has history of multiple medical problems including fibromyalgia, pancreatitis, interstitial cystitis, factor V Leyden mutation, pelvic floor dysfunction, rheumatoid arthritis, heart palpitations, syncope, noncardiac chest pain who presents to the ED with complaints of palpitations chest pain and headache.  Patient states she started to feel her heart racing today.  It was also skipping beats and beating irregularly.  She tried drinking a lot of fluids to see if her symptoms would pass that she has had this previously.  Patient states her symptoms persisted for the last couple of hours and did not get any better.  She has had some pain in the subxiphoid region going up to her mid sternum.  She is also noticed that her blood pressure was elevated.  She denies any cough.  No fevers or chills.  She does have a headache that feels like a migraine ? ?Home Medications ?Prior to Admission medications   ?Medication Sig Start Date End Date Taking? Authorizing Provider  ?albuterol (PROVENTIL HFA;VENTOLIN HFA) 108 (90 BASE) MCG/ACT inhaler Inhale 1 puff into the lungs as needed. For shortness of breath or wheezing    [provider]  ?Ascorbic Acid (VITA-C PO) Take by mouth.    [provider]  ?azelastine (ASTELIN) 0.1 % nasal spray as directed. 09/13/18   [provider]  ?Biotin 10000 MCG TABS Take by mouth daily.    [provider]  ?butalbital-acetaminophen-caffeine (FIORICET) 50-325-40 MG tablet Take 1 tablet by mouth every 6 (six) hours as needed. 11/19/20   [provider]  ?Calcium-Phosphorus-Vitamin D (CALCIUM GUMMIES PO) Take 1 tablet by mouth every evening.    [provider]  ?Cholecalciferol (VITAMIN D3) 5000 units TABS Take by  mouth daily.    [provider]  ?co-enzyme Q-10 30 MG capsule Take 30 mg by mouth daily.    [provider]  ?Cyanocobalamin 5000 MCG SUBL Place 5,000 mcg under the tongue daily.    [provider]  ?diazepam (VALIUM) 10 MG tablet Take 10 mg by mouth daily.    [provider]  ?dicyclomine (BENTYL) 20 MG tablet Take 20 mg by mouth every 6 (six) hours.    [provider]  ?diltiazem (CARDIZEM CD) 120 MG 24 hr capsule TAKE 1 CAPSULE BY MOUTH ONCE DAILY 09/29/20   End, Harrell Gave, MD  ?diltiazem (CARDIZEM) 30 MG tablet Take 1 tablet (30 mg total) by mouth every 8 (eight) hours as needed (for breakthrough palpitations). 12/09/19 12/08/20  Marrianne Mood D, PA-C  ?ELMIRON 100 MG capsule Take 200 mg by mouth 2 (two) times daily. 09/26/16   [provider]  ?fexofenadine (ALLEGRA) 180 MG tablet Take 180 mg by mouth every morning. 12/01/19   [provider]  ?fluconazole (DIFLUCAN) 100 MG tablet Take 100 mg by mouth daily. 02/23/20   [provider]  ?fluticasone (FLONASE) 50 MCG/ACT nasal spray SHAKE LQ AND U 1 TO 2 SPRAYS IEN QD UTD 06/03/16   [provider]  ?Fluticasone-Salmeterol (ADVAIR) 250-50 MCG/DOSE AEPB Inhale 1 puff into the lungs 2 (two) times a day.     [provider]  ?furosemide (LASIX) 20 MG tablet Take 20 mg by mouth every morning. 04/17/20   [provider]  ?gabapentin (NEURONTIN) 800 MG tablet Take 800  mg by mouth 3 (three) times daily. 11/20/19   [provider]  ?guaiFENesin (MUCINEX) 600 MG 12 hr tablet Take 1,200 mg by mouth 2 (two) times daily.    [provider]  ?hydrOXYzine (ATARAX/VISTARIL) 50 MG tablet Take 50 mg by mouth at bedtime.    [provider]  ?hyoscyamine (LEVSIN SL) 0.125 MG SL tablet Place 0.125 mg under the tongue every 4 (four) hours as needed for cramping.    [provider]  ?Lactobacillus (ACIDOPHILUS PROBIOTIC PO) Take by mouth 3 (three) times  daily.    [provider]  ?lidocaine (XYLOCAINE) 5 % ointment Apply topically daily. 09/26/16   [provider]  ?methocarbamol (ROBAXIN) 750 MG tablet Take 750 mg by mouth every 8 (eight) hours. 10/30/19   [provider]  ?montelukast (SINGULAIR) 10 MG tablet Take 10 mg by mouth at bedtime.    [provider]  ?nitrofurantoin (MACRODANTIN) 50 MG capsule Take 50 mg by mouth daily. 11/11/20   [provider]  ?Oxcarbazepine (TRILEPTAL) 300 MG tablet Take by mouth. 11/24/20   [provider]  ?oxybutynin (DITROPAN) 5 MG tablet Take 5 mg by mouth 3 (three) times daily as needed. 10/18/20   [provider]  ?PROCTOZONE-HC 2.5 % rectal cream Apply topically as needed. 09/26/16   [provider]  ?Simethicone 125 MG CAPS Take 125 mg by mouth 4 (four) times daily.    [provider]  ?TURMERIC PO Take by mouth daily.    [provider]  ?valACYclovir (VALTREX) 1000 MG tablet Take 1,000 mg by mouth 3 (three) times daily. 10/22/20   [provider]  ?valACYclovir (VALTREX) 500 MG tablet Take 500 mg by mouth 2 (two) times daily. 11/07/19   [provider]  ?vitamin k 100 MCG tablet Take 100 mcg by mouth daily.    [provider]  ?Zinc 25 MG TABS Take by mouth daily.    [provider]  ?   ? ?Allergies    ?Amitriptyline, Amoxicillin, Bromelains, Celexa [citalopram hydrobromide], Ivp dye [iodinated contrast media], Nortriptyline, Penicillins, Pregabalin, Bromelains [pineapple extract], Budesonide-formoterol fumarate, Citalopram, Mangifera indica, Methylene blue, Sertraline, Sulfa antibiotics, Sulfonamide derivatives, Bromaline [albertsons di bromm], and Other   ? ?Review of Systems   ?Review of Systems  ?Constitutional:  Negative for fever.  ? ?Physical Exam ?Updated Vital Signs ?BP (!) 131/91   Pulse 75   Temp 97.8 ?F (36.6 ?C) (Oral)   Resp (!) 23   Ht 1.702 m ('5\' 7"'$ )   Wt 78 kg   LMP 05/20/2011    SpO2 100%   BMI 26.93 kg/m?  ?Physical Exam ?Vitals and nursing note reviewed.  ?Constitutional:   ?   General: She is not in acute distress. ?   Appearance: She is well-developed. She is not diaphoretic.  ?HENT:  ?   Head: Normocephalic and atraumatic.  ?   Right Ear: External ear normal.  ?   Left Ear: External ear normal.  ?Eyes:  ?   General: No scleral icterus.    ?   Right eye: No discharge.     ?   Left eye: No discharge.  ?   Conjunctiva/sclera: Conjunctivae normal.  ?Neck:  ?   Trachea: No tracheal deviation.  ?Cardiovascular:  ?   Rate and Rhythm: Normal rate. Rhythm irregular.  ?Pulmonary:  ?   Effort: Pulmonary effort is normal. No respiratory distress.  ?   Breath sounds: Normal breath sounds. No stridor. No wheezing  or rales.  ?Abdominal:  ?   General: Bowel sounds are normal. There is no distension.  ?   Palpations: Abdomen is soft.  ?   Tenderness: There is no abdominal tenderness. There is no guarding or rebound.  ?Musculoskeletal:     ?   General: No tenderness or deformity.  ?   Cervical back: Neck supple.  ?Skin: ?   General: Skin is warm and dry.  ?   Findings: No rash.  ?Neurological:  ?   General: No focal deficit present.  ?   Mental Status: She is alert.  ?   Cranial Nerves: No cranial nerve deficit (no facial droop, extraocular movements intact, no slurred speech).  ?   Sensory: No sensory deficit.  ?   Motor: No abnormal muscle tone or seizure activity.  ?   Coordination: Coordination normal.  ?Psychiatric:     ?   Mood and Affect: Mood normal.  ? ? ?ED Results / Procedures / Treatments   ?Labs ?(all labs ordered are listed, but only abnormal results are displayed) ?Labs Reviewed  ?COMPREHENSIVE METABOLIC PANEL - Abnormal; Notable for the following components:  ?    Result Value  ? Sodium 128 (*)   ? Potassium 3.3 (*)   ? Chloride 97 (*)   ? CO2 21 (*)   ? Glucose, Bld 65 (*)   ? BUN <5 (*)   ? Alkaline Phosphatase 28 (*)   ? All other components within normal limits  ?URINALYSIS, ROUTINE  W REFLEX MICROSCOPIC - Abnormal; Notable for the following components:  ? Color, Urine COLORLESS (*)   ? Specific Gravity, Urine 1.002 (*)   ? All other components within normal limits  ?CBC  ?D-DIMER, Jasmine December

## 2021-06-05 NOTE — ED Triage Notes (Addendum)
PT states palpitations, rapid heart rate, hypertension and headache after having an argument with her husband today.  Pt took diazepam and other medications to decrease symptoms, but they did not. ?

## 2021-06-10 ENCOUNTER — Telehealth: Payer: Self-pay | Admitting: Internal Medicine

## 2021-06-10 NOTE — Telephone Encounter (Signed)
?*  STAT* If patient is at the pharmacy, call can be transferred to refill team. ? ? ?1. Which medications need to be refilled? (please list name of each medication and dose if known)  ? diltiazem (CARDIZEM) 30 MG tablet (Expired)  ? ? ?2. Which pharmacy/location (including street and city if local pharmacy) is medication to be sent to? [place], [place] ? ?3. Do they need a 30 day or 90 day supply?  ?30 day ? ? ?Pt has scheduled appt for 06/22/21 ?

## 2021-06-13 MED ORDER — DILTIAZEM HCL 30 MG PO TABS: 30.0000 mg | ORAL_TABLET | Freq: Three times a day (TID) | ORAL | 0 refills | Status: DC | PRN

## 2021-06-13 NOTE — Telephone Encounter (Signed)
Requested Prescriptions  ? ?Signed Prescriptions Disp Refills  ? diltiazem (CARDIZEM) 30 MG tablet 30 tablet 0  ?  Sig: Take 1 tablet (30 mg total) by mouth every 8 (eight) hours as needed (for breakthrough palpitations).  ?  Authorizing Provider: END, CHRISTOPHER  ?  Ordering User: NEWCOMER MCCLAIN, Shaheen Star L  ? ? ?

## 2021-06-16 ENCOUNTER — Other Ambulatory Visit
Admission: RE | Admit: 2021-06-16 | Discharge: 2021-06-16 | Disposition: A | Discharge status: Home / Self Care | Payer: Medicare Other | Attending: Internal Medicine | Admitting: Internal Medicine

## 2021-06-16 ENCOUNTER — Ambulatory Visit (INDEPENDENT_AMBULATORY_CARE_PROVIDER_SITE_OTHER): Payer: Medicare Other | Admitting: Internal Medicine

## 2021-06-16 ENCOUNTER — Encounter: Payer: Self-pay | Admitting: Internal Medicine

## 2021-06-16 VITALS — BP 120/90 | HR 69 | Ht 65.0 in | Wt 171.4 lb

## 2021-06-16 DIAGNOSIS — E871 Hypo-osmolality and hyponatremia: Secondary | ICD-10-CM | POA: Insufficient documentation

## 2021-06-16 DIAGNOSIS — R002 Palpitations: Secondary | ICD-10-CM | POA: Diagnosis not present

## 2021-06-16 DIAGNOSIS — E876 Hypokalemia: Secondary | ICD-10-CM | POA: Diagnosis not present

## 2021-06-16 DIAGNOSIS — R0789 Other chest pain: Secondary | ICD-10-CM

## 2021-06-16 LAB — BASIC METABOLIC PANEL
Anion gap: 9 (ref 5–15)
BUN: 7 mg/dL (ref 6–20)
CO2: 26 mmol/L (ref 22–32)
Calcium: 9.6 mg/dL (ref 8.9–10.3)
Chloride: 98 mmol/L (ref 98–111)
Creatinine, Ser: 0.73 mg/dL (ref 0.44–1.00)
GFR, Estimated: >60 mL/min (ref >60)
Glucose, Bld: 103 mg/dL — ABNORMAL HIGH (ref 70–99)
Potassium: 4.6 mmol/L (ref 3.5–5.1)
Sodium: 133 mmol/L — ABNORMAL LOW (ref 135–145)

## 2021-06-16 NOTE — Patient Instructions (Signed)
Medication Instructions:   Your physician has recommended you make the following change in your medication:   STOP Lasix (furosemide)  *If you need a refill on your cardiac medications before your next appointment, please call your pharmacy*   Lab Work:  BMET today at the medical mall:  -  Please go to the [facility] will check in at the front desk to the right as you walk into the atrium.  If you have labs (blood work) drawn today and your tests are completely normal, you will receive your results only by: [facility] (if you have MyChart) OR A paper copy in the mail If you have any lab test that is abnormal or we need to change your treatment, we will call you to review the results.   Testing/Procedures:  None ordered   Follow-Up: At [facility], you and your health needs are our priority.  As part of our continuing mission to provide you with exceptional heart care, we have created designated Provider Care Teams.  These Care Teams include your primary Cardiologist (physician) and Advanced Practice Providers (APPs -  Physician Assistants and Nurse Practitioners) who all work together to provide you with the care you need, when you need it.  We recommend signing up for the patient portal called "MyChart".  Sign up information is provided on this After Visit Summary.  MyChart is used to connect with patients for Virtual Visits (Telemedicine).  Patients are able to view lab/test results, encounter notes, upcoming appointments, etc.  Non-urgent messages can be sent to your provider as well.   To learn more about what you can do with MyChart, go to [email or web address].    Your next appointment:    1 month(s) w/ APP  The format for your next appointment:   In Person  Provider:   You will see one of the following Advanced Practice Providers on your designated Care Team:   Murray Hodgkins, NP Christell Faith, PA-C Cadence Kathlen Mody, [place]   Important  Information About Sugar

## 2021-06-16 NOTE — Progress Notes (Signed)
Follow-up Outpatient Visit Date: 06/16/2021  Primary Care Provider: Jodi Marble, MD [place] [id number]  Chief Complaint: Palpitations and electrolyte abnormalities  HPI:  April [name] is a 51 y.o. 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 Bynum [name] @ [facility]) who was on Xarelto for this (discontinued due to severe anemia), fibromyalgia, asthma and rheumatoid arthritis, who presents for follow-up of hyponatremia.  She was last seen in our office in December by Dr. Caryl [name], having been referred to him by an outside provider for evaluation of possible POTS.  Her clinical picture was felt to be inconsistent with POTS given lack of orthostatic tachycardia response.  As prior event monitors were unrevealing, the AliveCor for home monitoring was suggested.  She presented to the [facility], ED a week ago complaining of palpitations, headache, and chest pain.  Work-up was reassuring, though she was noted to have mild hyponatremia and hypokalemia.  April [name] reports that she has been experiencing more frequent palpitations.  She attributes some of this to having missed several days of her diltiazem.  She feels like her palpitations are brought on mostly by excitement or anxiety.  The episode that led to her recent ED visit was caused by a particularly exciting visit to church.  She noted that her heart rate went up quite a bit.  She has measured some home heart rates up to 200 bpm.  She denies frank chest pain.  She continues to have an intermittent headache, though this seems to be getting better.  She experiences intermittent shortness of breath and feels like it gets worse when she does not take furosemide.  However, on further questioning, she seems to interchange furosemide, long-acting diltiazem, and as needed diltiazem.  She has also been using her inhaler with some  relief.  --------------------------------------------------------------------------------------------------  Past Medical History:  Diagnosis Date   Anxiety 12/14/2011   Asthma    no inhaler   Cataract    Diastolic dysfunction    a. 10/2015 Echo: EF nl, mod diast dysfxn. Mild MR/TR; b. 09/2016 Echo: EF 55-60%, no rwma. Nl RV size and fxn; c. 03/2020 Echo: EF 60-65%. No rwma.   Factor 5 Leiden mutation, heterozygous ([place])    Fibromyalgia    Fracture of 5th metatarsal 03/2011   left foot -     Heart palpitations    a. Notes intermittent tachycardia (sinus). Did not tolerate beta blockers 2/2 urinary incontinence; b. 07/2017 Event monitor: Avg HR 77 (49-136). No significant ectopy/pauses. Triggered events corresponded w/ sinus rhythm, sinus arrhythmias, and artifact.   Interstitial cystitis    Non-cardiac chest pain    a. 12/2015 CTA chest: no PE. No significant coronary Ca2+. Mosaic attenuation pattern in both lungs, may reflect small airway dzs; c. 12/2015 Ex MV: Walked 6 mins. No ischemia/scar; d. 12/2019 Cor CTA: Ca2+ = 0. Nl cors. ? PFO w/o evidence of flow (not seen on echo).   Ovarian cyst    Pancreatitis    Pelvic floor dysfunction    Rheumatoid arteritis ([place])    Sinus infection    chronic   Syncope    a. 08/2017 Event monitor: Average heart rate 77 (49-136).  Periods of sinus arrhythmia noted.  Triggered events corresponded with sinus rhythm, sinus arrhythmia, and artifact.  No significant ectopy, sustained arrhythmias, or prolonged pauses observed.   Thrombosis    a. 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   Past Surgical History:  Procedure Laterality Date   ABDOMINAL HYSTERECTOMY     CHOLECYSTECTOMY     CYSTOSCOPY WITH HYDRODISTENSION AND BIOPSY  04/21/2011   Dr April [name]   LAPAROSCOPIC ASSISTED VAGINAL HYSTERECTOMY  08/21/2011   Procedure: LAPAROSCOPIC ASSISTED VAGINAL HYSTERECTOMY;   Surgeon: April Mourning, MD;  Location: [facility];  Service: Gynecology;  Laterality: N/A;  OPEN LAPAROSCOPIC   LAPAROSCOPIC NISSEN FUNDOPLICATION  3335   neck ablation Left 12/29/2019   neck ablation Right 01/05/2020   SALPINGOOPHORECTOMY  08/21/2011   Procedure: SALPINGO OOPHERECTOMY;  Surgeon: April Mourning, MD;  Location: [facility];  Service: Gynecology;  Laterality: Bilateral;   TUBAL LIGATION  2010   WISDOM TOOTH EXTRACTION      Current Meds  Medication Sig   albuterol (PROVENTIL HFA;VENTOLIN HFA) 108 (90 BASE) MCG/ACT inhaler Inhale 1 puff into the lungs as needed. For shortness of breath or wheezing   azelastine (ASTELIN) 0.1 % nasal spray as directed.   Cholecalciferol (VITAMIN D3) 5000 units TABS Take by mouth daily.   diazepam (VALIUM) 10 MG tablet Take 10 mg by mouth daily.   dicyclomine (BENTYL) 20 MG tablet Take 20 mg by mouth every 6 (six) hours.   diltiazem (CARDIZEM CD) 120 MG 24 hr capsule TAKE 1 CAPSULE BY MOUTH ONCE DAILY   diltiazem (CARDIZEM) 30 MG tablet Take 1 tablet (30 mg total) by mouth every 8 (eight) hours as needed (for breakthrough palpitations).   ELMIRON 100 MG capsule Take 200 mg by mouth 2 (two) times daily.   fexofenadine (ALLEGRA) 180 MG tablet Take 180 mg by mouth every morning.   fluticasone (FLONASE) 50 MCG/ACT nasal spray SHAKE LQ AND U 1 TO 2 SPRAYS IEN QD UTD   Fluticasone-Salmeterol (ADVAIR) 250-50 MCG/DOSE AEPB Inhale 1 puff into the lungs 2 (two) times a day.    gabapentin (NEURONTIN) 800 MG tablet Take 800 mg by mouth 3 (three) times daily.   guaiFENesin (MUCINEX) 600 MG 12 hr tablet Take 1,200 mg by mouth 2 (two) times daily.   hydrOXYzine (ATARAX/VISTARIL) 50 MG tablet Take 50 mg by mouth at bedtime.   hyoscyamine (LEVSIN SL) 0.125 MG SL tablet Place 0.125 mg under the tongue every 4 (four) hours as needed for cramping.   Lactobacillus (ACIDOPHILUS PROBIOTIC PO) Take by mouth 3 (three) times daily.   lidocaine (XYLOCAINE) 5 % ointment Apply  topically daily.   methocarbamol (ROBAXIN) 750 MG tablet Take 750 mg by mouth every 8 (eight) hours.   montelukast (SINGULAIR) 10 MG tablet Take 10 mg by mouth at bedtime.   nitrofurantoin (MACRODANTIN) 50 MG capsule Take 50 mg by mouth daily.   oxybutynin (DITROPAN) 5 MG tablet Take 5 mg by mouth 3 (three) times daily as needed.   PROCTOZONE-HC 2.5 % rectal cream Apply topically as needed.   Simethicone 125 MG CAPS Take 125 mg by mouth 4 (four) times daily.   [DISCONTINUED] furosemide (LASIX) 20 MG tablet Take 20 mg by mouth every morning.   Current Facility-Administered Medications for the 06/16/21 encounter (Office Visit) with Traeh Milroy, Harrell Gave, MD  Medication   betamethasone acetate-betamethasone sodium phosphate (CELESTONE) injection 3 mg    Allergies: Amitriptyline, Amoxicillin, Bromelains, Celexa [citalopram hydrobromide], Ivp dye [iodinated contrast media], Nortriptyline, Penicillins, Pregabalin, Bromelains [pineapple extract], Budesonide-formoterol fumarate, Citalopram, Mangifera indica, Methylene blue, Sertraline, Sulfa antibiotics, Sulfonamide derivatives, Bromaline [albertsons di bromm], and Other  Social History   Tobacco Use   Smoking status: Never   Smokeless tobacco: Never  Vaping Use   Vaping Use: Never used  Substance Use Topics   Alcohol use: No   Drug use: No    Family History  Problem Relation Age of Onset   Heart Problems Mother    Vascular Disease Mother    Skin cancer Mother    Vascular Disease Maternal Grandmother    Kidney Stones Father    Skin cancer Maternal Uncle    Anesthesia problems Neg Hx     Review of Systems: A 12-system review of systems was performed and was negative except as noted in the HPI.  --------------------------------------------------------------------------------------------------  Physical Exam: BP 120/90   Pulse 69   Ht '5\' 5"'$  (1.651 m)   Wt 171 lb 6.4 oz (77.7 kg)   LMP 05/20/2011   BMI 28.52 kg/m   General:   NAD. Neck: No JVD or HJR. Lungs: Clear to auscultation bilaterally without wheezes or crackles. Heart: Regular rate and rhythm without murmurs, rubs, or gallops. Abdomen: Soft, nontender, nondistended. Extremities: No lower extremity edema.  EKG: Normal sinus rhythm with short PR interval.  Otherwise, no significant abnormality.  Compared with prior tracing from 06/05/2021, PACs are no longer present.  Otherwise, no significant interval change.  Lab Results  Component Value Date   WBC 9.4 06/05/2021   HGB 13.0 06/05/2021   HCT 37.8 06/05/2021   MCV 96.7 06/05/2021   PLT 268 06/05/2021    Lab Results  Component Value Date   NA 133 (L) 06/16/2021   K 4.6 06/16/2021   CL 98 06/16/2021   CO2 26 06/16/2021   BUN 7 06/16/2021   CREATININE 0.73 06/16/2021   GLUCOSE 103 (H) 06/16/2021   ALT 24 06/05/2021    No results found for: CHOL, HDL, LDLCALC, LDLDIRECT, TRIG, CHOLHDL  --------------------------------------------------------------------------------------------------  ASSESSMENT AND PLAN: Palpitations, hypokalemia, and hyponatremia: Patient reports frequent palpitations that are generally well controlled with diltiazem.  I have encouraged her to continue her standing and as needed diltiazem regimen.  Prior event monitors have not shown any significant arrhythmia.  I reiterated Dr. Olin Pia prior recommendation of using an AliveCor cardia monitor to potentially capture one of her episodes.  Thus far, extensive cardiac work-up has not revealed any significant pathology.  Importance of staying well-hydrated and maintaining normal electrolytes was reinforced.  She does not have a diagnosis of heart failure nor does she appear volume overloaded on exam today.  Given her history of recurrent hyponatremia, I have recommended discontinuation of furosemide.  We will check a BMP today and again when she returns for follow-up with an APP in 1 month.  Follow-up: Return to clinic in 1  month.  [name], MD 06/17/2021 2:29 PM

## 2021-06-17 ENCOUNTER — Encounter: Payer: Self-pay | Admitting: Internal Medicine

## 2021-06-17 DIAGNOSIS — E871 Hypo-osmolality and hyponatremia: Secondary | ICD-10-CM | POA: Insufficient documentation

## 2021-06-17 DIAGNOSIS — E876 Hypokalemia: Secondary | ICD-10-CM | POA: Insufficient documentation

## 2021-06-21 DIAGNOSIS — J301 Allergic rhinitis due to pollen: Secondary | ICD-10-CM | POA: Diagnosis not present

## 2021-06-21 DIAGNOSIS — J45998 Other asthma: Secondary | ICD-10-CM | POA: Diagnosis not present

## 2021-06-21 DIAGNOSIS — M797 Fibromyalgia: Secondary | ICD-10-CM | POA: Diagnosis not present

## 2021-06-21 DIAGNOSIS — B029 Zoster without complications: Secondary | ICD-10-CM | POA: Diagnosis not present

## 2021-06-21 DIAGNOSIS — G4489 Other headache syndrome: Secondary | ICD-10-CM | POA: Diagnosis not present

## 2021-06-21 DIAGNOSIS — E785 Hyperlipidemia, unspecified: Secondary | ICD-10-CM | POA: Diagnosis not present

## 2021-06-21 DIAGNOSIS — E876 Hypokalemia: Secondary | ICD-10-CM | POA: Diagnosis not present

## 2021-06-21 DIAGNOSIS — D649 Anemia, unspecified: Secondary | ICD-10-CM | POA: Diagnosis not present

## 2021-06-21 DIAGNOSIS — E039 Hypothyroidism, unspecified: Secondary | ICD-10-CM | POA: Diagnosis not present

## 2021-06-22 ENCOUNTER — Ambulatory Visit: Payer: Medicare Other | Admitting: Nurse Practitioner

## 2021-06-24 ENCOUNTER — Other Ambulatory Visit: Payer: Self-pay | Admitting: Internal Medicine

## 2021-06-28 DIAGNOSIS — Z1231 Encounter for screening mammogram for malignant neoplasm of breast: Secondary | ICD-10-CM | POA: Diagnosis not present

## 2021-06-28 DIAGNOSIS — R948 Abnormal results of function studies of other organs and systems: Secondary | ICD-10-CM | POA: Diagnosis not present

## 2021-07-13 DIAGNOSIS — R3915 Urgency of urination: Secondary | ICD-10-CM | POA: Diagnosis not present

## 2021-07-13 DIAGNOSIS — N390 Urinary tract infection, site not specified: Secondary | ICD-10-CM | POA: Diagnosis not present

## 2021-07-13 DIAGNOSIS — N3010 Interstitial cystitis (chronic) without hematuria: Secondary | ICD-10-CM | POA: Diagnosis not present

## 2021-07-21 DIAGNOSIS — L570 Actinic keratosis: Secondary | ICD-10-CM | POA: Diagnosis not present

## 2021-07-21 DIAGNOSIS — D225 Melanocytic nevi of trunk: Secondary | ICD-10-CM | POA: Diagnosis not present

## 2021-07-21 DIAGNOSIS — L578 Other skin changes due to chronic exposure to nonionizing radiation: Secondary | ICD-10-CM | POA: Diagnosis not present

## 2021-07-25 NOTE — Progress Notes (Signed)
Office Visit    Patient Name: April [name] Date of Encounter: 07/25/2021  Primary Care Provider:  Jodi Marble, MD Primary Cardiologist:  [name], MD Primary Electrophysiologist: None  Chief Complaint    April [name] is a 51 y.o. female with PMH of gonadal vein thrombosis, factor V Leyden mutation, bladder stimulator,diastolic dysfunction, anemia, myalgia, atypical chest pain presents today for follow-up of palpitations and hyponatremia.  Past Medical History    Past Medical History:  Diagnosis Date   Anxiety 12/14/2011   Asthma    no inhaler   Cataract    Diastolic dysfunction    a. 10/2015 Echo: EF nl, mod diast dysfxn. Mild MR/TR; b. 09/2016 Echo: EF 55-60%, no rwma. Nl RV size and fxn; c. 03/2020 Echo: EF 60-65%. No rwma.   Factor 5 Leiden mutation, heterozygous ([place])    Fibromyalgia    Fracture of 5th metatarsal 03/2011   left foot -     Heart palpitations    a. Notes intermittent tachycardia (sinus). Did not tolerate beta blockers 2/2 urinary incontinence; b. 07/2017 Event monitor: Avg HR 77 (49-136). No significant ectopy/pauses. Triggered events corresponded w/ sinus rhythm, sinus arrhythmias, and artifact.   Interstitial cystitis    Non-cardiac chest pain    a. 12/2015 CTA chest: no PE. No significant coronary Ca2+. Mosaic attenuation pattern in both lungs, may reflect small airway dzs; c. 12/2015 Ex MV: Walked 6 mins. No ischemia/scar; d. 12/2019 Cor CTA: Ca2+ = 0. Nl cors. ? PFO w/o evidence of flow (not seen on echo).   Ovarian cyst    Pancreatitis    Pelvic floor dysfunction    Rheumatoid arteritis ([place])    Sinus infection    chronic   Syncope    a. 08/2017 Event monitor: Average heart rate 77 (49-136).  Periods of sinus arrhythmia noted.  Triggered events corresponded with sinus rhythm, sinus arrhythmia, and artifact.  No significant ectopy, sustained arrhythmias, or prolonged pauses observed.   Thrombosis    a. 12/14/2011 Thrombosis of R  gonadal vein with extension of thrombus into IVC to level of R renal vein per CT 12/11/12 of [place].   Past Surgical History:  Procedure Laterality Date   ABDOMINAL HYSTERECTOMY     CHOLECYSTECTOMY     CYSTOSCOPY WITH HYDRODISTENSION AND BIOPSY  04/21/2011   Dr Philis Fendt   LAPAROSCOPIC ASSISTED VAGINAL HYSTERECTOMY  08/21/2011   Procedure: LAPAROSCOPIC ASSISTED VAGINAL HYSTERECTOMY;  Surgeon: Cyril Mourning, MD;  Location: [facility];  Service: Gynecology;  Laterality: N/A;  OPEN LAPAROSCOPIC   LAPAROSCOPIC NISSEN FUNDOPLICATION  2683   neck ablation Left 12/29/2019   neck ablation Right 01/05/2020   SALPINGOOPHORECTOMY  08/21/2011   Procedure: SALPINGO OOPHERECTOMY;  Surgeon: Cyril Mourning, MD;  Location: [facility];  Service: Gynecology;  Laterality: Bilateral;   TUBAL LIGATION  2010   WISDOM TOOTH EXTRACTION      Allergies  Allergies  Allergen Reactions   Amitriptyline Other (See Comments)    Other reaction(s): Other Change in mental status   Amoxicillin Rash   Bromelains     Other reaction(s): Abdominal pain   Celexa [Citalopram Hydrobromide] Other (See Comments)    Other reaction(s): Other Change in mental status   Ivp Dye [Iodinated Contrast Media] Anaphylaxis    Pt reports "I do not have contrast allergy and have received IVP dye without pre-med with NO reaction."    Nortriptyline     Other reaction(s): Other   Penicillins Swelling and  Rash   Pregabalin     Other reaction(s): Myalgias (muscle pain)   Bromelains [Pineapple Extract] Other (See Comments)    Severe pain   Budesonide-Formoterol Fumarate     Other reaction(s): Other   Citalopram Other (See Comments)   Mangifera Indica Swelling   Methylene Blue Other (See Comments)    Caused inflamed pancreas, per pt should not use   Sertraline Other (See Comments)    Other reaction(s): Other Suicidal thoughts   Sulfa Antibiotics Other (See Comments)   Sulfonamide Derivatives Other (See Comments)    excrutiating  pain, patient states it caused her to have osteoarthritis   Bromaline Lorretta Harp Bromm] Rash   Other Rash    Mango    History of Present Illness    April [name] is a 51 year old female with the above-mentioned past medical history who presents today for follow-up of palpitations and hyponatremia.  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  2D echo was completed and 10/2014 for complaint of dyspnea that showed normal LV function with moderate diastolic dysfunction.  She underwent stress testing for complaint of atypical chest pain in 12/2015 which did not show any evidence of ischemia.  She has a history of palpitations that has been managed with Cardizem.  Most recent 2D echo from 03/2020 showed EF 60-65% with normal LV function.  Patient had coronary CT completed showed a calcium score 0 with normal coronary arteries and she was seen by structural heart for questionable PFO that was felt not significant for repair.  She was recently seen by Dr. Caryl Comes on 12/2020 for possible POTS syndrome.  Her blood pressure was elevated and She was however found not to have orthostatic tachycardia response and AliveCor home monitor was suggested.  She presented to [facility] on 06/05/2021 with complaint of hyponatremia and hypokalemia with chest discomfort.  She was provided IV fluid replacement and and her condition was suggested nonemergent for admission.  She was discharged home following a negative cardiac work up.  She was last seen by Dr. In on 06/16/2021 recent ED visit for hyponatremia and hypokalemia.  She stated during visit that she had been experiencing increased palpitations that were brought on by anxiety and excitement. She was also noted to have missed several days of her Cardizem.  She measured at home and heart rate of 200 bpm and endorsed continued intermittent headaches.  She denied  chest pain or shortness of breath.  Her Lasix was discontinued and she was advised to stay hydrated and maintain normal electrolytes.  [name] presents today with her daughter for 1 month follow-up. Since last being seen in the office patient reports that she is feeling a little better however she experienced another fainting spell last Saturday that lasted 2 minutes and was associated with increased heart rate and chest pain.  She was instructed at her last appointment to stop Lasix however she resumed it after 4 days due to a 10 pound weight gain that occurred after stopping.  She was euvolemic on examination today and free of any chest pain or associated similar symptoms.  She states that she is about to have a cystoscopy by her urologist that she hopes will cure her recent issues with accelerated heart rate.  We reviewed Dr. Saunders Revel and Dr. Olin Pia recommendation of the AliveCor monitor and I provided a visual of what the monitor  looks like via [employer].  She states she may order the monitor in the future.  She states that during Saturdays event she did not have her compression stockings on.  Patient denies chest pain, palpitations, dyspnea, PND, orthopnea, nausea, vomiting, dizziness, syncope, edema, weight gain, or early satiety.   Current Outpatient Medications  Medication Sig Dispense Refill   albuterol (PROVENTIL HFA;VENTOLIN HFA) 108 (90 BASE) MCG/ACT inhaler Inhale 1 puff into the lungs as needed. For shortness of breath or wheezing     azelastine (ASTELIN) 0.1 % nasal spray as directed.     Cholecalciferol (VITAMIN D3) 5000 units TABS Take by mouth daily.     diazepam (VALIUM) 10 MG tablet Take 10 mg by mouth daily.     dicyclomine (BENTYL) 20 MG tablet Take 20 mg by mouth every 6 (six) hours.     diltiazem (CARDIZEM CD) 120 MG 24 hr capsule TAKE 1 CAPSULE BY MOUTH ONCE DAILY 90 capsule 3   diltiazem (CARDIZEM) 30 MG tablet Take 1 tablet (30 mg total) by mouth every 8 (eight) hours as needed  (for breakthrough palpitations). 30 tablet 0   ELMIRON 100 MG capsule Take 200 mg by mouth 2 (two) times daily.  0   fexofenadine (ALLEGRA) 180 MG tablet Take 180 mg by mouth every morning.     fluticasone (FLONASE) 50 MCG/ACT nasal spray SHAKE LQ AND U 1 TO 2 SPRAYS IEN QD UTD  3   Fluticasone-Salmeterol (ADVAIR) 250-50 MCG/DOSE AEPB Inhale 1 puff into the lungs 2 (two) times a day.      gabapentin (NEURONTIN) 800 MG tablet Take 800 mg by mouth 3 (three) times daily.     guaiFENesin (MUCINEX) 600 MG 12 hr tablet Take 1,200 mg by mouth 2 (two) times daily.     hydrOXYzine (ATARAX/VISTARIL) 50 MG tablet Take 50 mg by mouth at bedtime.     hyoscyamine (LEVSIN SL) 0.125 MG SL tablet Place 0.125 mg under the tongue every 4 (four) hours as needed for cramping.     Lactobacillus (ACIDOPHILUS PROBIOTIC PO) Take by mouth 3 (three) times daily.     lidocaine (XYLOCAINE) 5 % ointment Apply topically daily.     methocarbamol (ROBAXIN) 750 MG tablet Take 750 mg by mouth every 8 (eight) hours.     montelukast (SINGULAIR) 10 MG tablet Take 10 mg by mouth at bedtime.     nitrofurantoin (MACRODANTIN) 50 MG capsule Take 50 mg by mouth daily.     oxybutynin (DITROPAN) 5 MG tablet Take 5 mg by mouth 3 (three) times daily as needed.     PROCTOZONE-HC 2.5 % rectal cream Apply topically as needed.  3   Simethicone 125 MG CAPS Take 125 mg by mouth 4 (four) times daily.     Current Facility-Administered Medications  Medication Dose Route Frequency Provider Last Rate Last Admin   betamethasone acetate-betamethasone sodium phosphate (CELESTONE) injection 3 mg  3 mg Intramuscular Once Edrick Kins, DPM         Review of Systems  Please see the history of present illness.    (+) Brief tachycardia (+) Tiredness  All other systems reviewed and are otherwise negative except as noted above.  Physical Exam    Wt Readings from Last 3 Encounters:  06/16/21 171 lb 6.4 oz (77.7 kg)  06/05/21 171 lb 15.3 oz (78 kg)   01/11/21 172 lb (78 kg)   NF:AOZHY were no vitals filed for this visit.,There is no height or weight on file to  calculate BMI.  Constitutional:      Appearance: Healthy appearance. Not in distress.  Neck:     Vascular: JVD normal.  Pulmonary:     Effort: Pulmonary effort is normal.     Breath sounds: No wheezing. No rales. Diminished in the bases Cardiovascular:     Normal rate. Regular rhythm. Normal S1. Normal S2.      Murmurs: There is no murmur.  Edema:    Peripheral edema absent.  Abdominal:     Palpations: Abdomen is soft non tender. There is no hepatomegaly.  Skin:    General: Skin is warm and dry.  Neurological:     General: No focal deficit present.     Mental Status: Alert and oriented to person, place and time.     Cranial Nerves: Cranial nerves are intact.  EKG/LABS/Other Studies Reviewed    ECG personally reviewed by me today -sinus rhythm with short PR and no acute findings heart rate of 83   Lab Results  Component Value Date   WBC 9.4 06/05/2021   HGB 13.0 06/05/2021   HCT 37.8 06/05/2021   MCV 96.7 06/05/2021   PLT 268 06/05/2021   Lab Results  Component Value Date   CREATININE 0.73 06/16/2021   BUN 7 06/16/2021   NA 133 (L) 06/16/2021   K 4.6 06/16/2021   CL 98 06/16/2021   CO2 26 06/16/2021   Lab Results  Component Value Date   ALT 24 06/05/2021   AST 23 06/05/2021   ALKPHOS 28 (L) 06/05/2021   BILITOT 0.6 06/05/2021   No results found for: "CHOL", "HDL", "LDLCALC", "LDLDIRECT", "TRIG", "CHOLHDL"  No results found for: "HGBA1C"  Assessment & Plan    1.  Palpitations: -Patient denies any palpitations associated with elevated heart rate. -We discussed at length obtaining the core alive monitor to hopefully capture her palpitations in the future. -Continue Cardizem 120 mg and 30 mg as needed for breakthrough palpitations  2.  Atypical chest pain: -Patient endorsed 1 episode of chest pain as noted above in HPI.  She states that the pain  was substernal and lasted for approximately 10 minutes.  She denies any similar anginal pain since this isolated episode. -She was advised to call go the the ED if her pain returns and is not relieved with rest or has an increase in intensisity   3.  Hyponatremia: -Patient was instructed to discontinue Lasix however she restarted the Lasix after 4 days due to 10 pound weight gain.  She also endorsed elevated creatinine that she states was discovered by lab work from her women's health physician. -We discussed the importance of maintaining proper hydration and wearing support stockings. -Continue Lasix 20 mg daily  Disposition: Follow-up with [name], MD or APP in 6 months  Medication Adjustments/Labs and Tests Ordered: Current medicines are reviewed at length with the patient today.  Concerns regarding medicines are outlined above.   Signed, Mable Fill, Marissa Nestle, NP 07/25/2021, 12:50 PM [facility]

## 2021-07-27 ENCOUNTER — Other Ambulatory Visit
Admission: RE | Admit: 2021-07-27 | Discharge: 2021-07-27 | Disposition: A | Discharge status: Home / Self Care | Payer: Medicare Other | Source: Ambulatory Visit | Attending: Nurse Practitioner | Admitting: Nurse Practitioner

## 2021-07-27 ENCOUNTER — Ambulatory Visit (INDEPENDENT_AMBULATORY_CARE_PROVIDER_SITE_OTHER): Payer: Medicare Other | Admitting: Nurse Practitioner

## 2021-07-27 ENCOUNTER — Encounter: Payer: Self-pay | Admitting: Nurse Practitioner

## 2021-07-27 VITALS — BP 132/84 | HR 83 | Ht 65.0 in | Wt 174.0 lb

## 2021-07-27 DIAGNOSIS — E871 Hypo-osmolality and hyponatremia: Secondary | ICD-10-CM | POA: Diagnosis not present

## 2021-07-27 DIAGNOSIS — R0789 Other chest pain: Secondary | ICD-10-CM

## 2021-07-27 DIAGNOSIS — R002 Palpitations: Secondary | ICD-10-CM

## 2021-07-27 LAB — BASIC METABOLIC PANEL
Anion gap: 7 (ref 5–15)
BUN: 5 mg/dL — ABNORMAL LOW (ref 6–20)
CO2: 28 mmol/L (ref 22–32)
Calcium: 9.3 mg/dL (ref 8.9–10.3)
Chloride: 99 mmol/L (ref 98–111)
Creatinine, Ser: 0.75 mg/dL (ref 0.44–1.00)
GFR, Estimated: >60 mL/min (ref >60)
Glucose, Bld: 100 mg/dL — ABNORMAL HIGH (ref 70–99)
Potassium: 4.1 mmol/L (ref 3.5–5.1)
Sodium: 134 mmol/L — ABNORMAL LOW (ref 135–145)

## 2021-07-27 MED ORDER — FUROSEMIDE 20 MG PO TABS: 20.0000 mg | ORAL_TABLET | Freq: Every day | ORAL | 3 refills | Status: DC

## 2021-07-27 NOTE — Patient Instructions (Addendum)
Medication Instructions:  No changes at this time  *If you need a refill on your cardiac medications before your next appointment, please call your pharmacy*   Lab Work: BMET today over at the [employer] at [facility] then go to 1st desk on the right to check in (REGISTRATION)    Lab hours: Monday- Friday (7:30 am- 5:30 pm)  If you have labs (blood work) drawn today and your tests are completely normal, you will receive your results only by: [place] (if you have MyChart) OR A paper copy in the mail If you have any lab test that is abnormal or we need to change your treatment, we will call you to review the results.   Testing/Procedures: None   Follow-Up: At [facility], you and your health needs are our priority.  As part of our continuing mission to provide you with exceptional heart care, we have created designated Provider Care Teams.  These Care Teams include your primary Cardiologist (physician) and Advanced Practice Providers (APPs -  Physician Assistants and Nurse Practitioners) who all work together to provide you with the care you need, when you need it.  Your next appointment:   6 month(s)  The format for your next appointment:   In Person  Provider:   [name], MD or Murray Hodgkins, NP    Important Information About Sugar

## 2021-07-28 ENCOUNTER — Telehealth: Payer: Self-pay | Admitting: Nurse Practitioner

## 2021-07-28 MED ORDER — FUROSEMIDE 20 MG PO TABS: 20.0000 mg | ORAL_TABLET | Freq: Every day | ORAL | Status: DC | PRN

## 2021-07-28 NOTE — Telephone Encounter (Signed)
Marylu Lund., NP  07/27/2021  7:32 PM EDT Back to Top    Hyponatremia slightly improved with stable BUN.  Please advise patient to change Lasix to as needed to decrease hyponatremia.

## 2021-07-28 NOTE — Telephone Encounter (Signed)
Patient made aware of lab results with verbalized understanding.

## 2021-09-02 DIAGNOSIS — H5213 Myopia, bilateral: Secondary | ICD-10-CM | POA: Diagnosis not present

## 2021-09-02 DIAGNOSIS — H524 Presbyopia: Secondary | ICD-10-CM | POA: Diagnosis not present

## 2021-09-19 ENCOUNTER — Other Ambulatory Visit: Payer: Self-pay | Admitting: Internal Medicine

## 2021-09-21 ENCOUNTER — Other Ambulatory Visit: Payer: Self-pay | Admitting: Internal Medicine

## 2021-09-26 DIAGNOSIS — B029 Zoster without complications: Secondary | ICD-10-CM | POA: Diagnosis not present

## 2021-09-26 DIAGNOSIS — E785 Hyperlipidemia, unspecified: Secondary | ICD-10-CM | POA: Diagnosis not present

## 2021-09-26 DIAGNOSIS — G4489 Other headache syndrome: Secondary | ICD-10-CM | POA: Diagnosis not present

## 2021-09-26 DIAGNOSIS — E876 Hypokalemia: Secondary | ICD-10-CM | POA: Diagnosis not present

## 2021-09-26 DIAGNOSIS — M797 Fibromyalgia: Secondary | ICD-10-CM | POA: Diagnosis not present

## 2021-09-26 DIAGNOSIS — J45998 Other asthma: Secondary | ICD-10-CM | POA: Diagnosis not present

## 2021-09-26 DIAGNOSIS — J301 Allergic rhinitis due to pollen: Secondary | ICD-10-CM | POA: Diagnosis not present

## 2021-10-14 DIAGNOSIS — N3010 Interstitial cystitis (chronic) without hematuria: Secondary | ICD-10-CM | POA: Diagnosis not present

## 2021-10-14 DIAGNOSIS — G894 Chronic pain syndrome: Secondary | ICD-10-CM | POA: Diagnosis not present

## 2021-10-14 DIAGNOSIS — R3982 Chronic bladder pain: Secondary | ICD-10-CM | POA: Diagnosis not present

## 2021-10-14 DIAGNOSIS — R350 Frequency of micturition: Secondary | ICD-10-CM | POA: Diagnosis not present

## 2021-10-14 DIAGNOSIS — M7918 Myalgia, other site: Secondary | ICD-10-CM | POA: Diagnosis not present

## 2021-10-14 DIAGNOSIS — R3915 Urgency of urination: Secondary | ICD-10-CM | POA: Diagnosis not present

## 2021-10-17 ENCOUNTER — Other Ambulatory Visit: Payer: Self-pay | Admitting: Internal Medicine

## 2021-11-15 DIAGNOSIS — G548 Other nerve root and plexus disorders: Secondary | ICD-10-CM | POA: Diagnosis not present

## 2021-11-15 DIAGNOSIS — L570 Actinic keratosis: Secondary | ICD-10-CM | POA: Diagnosis not present

## 2021-11-15 DIAGNOSIS — L298 Other pruritus: Secondary | ICD-10-CM | POA: Diagnosis not present

## 2021-11-15 NOTE — Progress Notes (Shared)
[facility] Note  11/29/2021     CHIEF COMPLAINT Patient presents for No chief complaint on file.    HISTORY OF PRESENT ILLNESS: April [name] is a 51 y.o. female who presents to the clinic today for:     pt states she is still on Elmiron '200mg'$  BID  Referring physician: Jodi Marble, MD [place],   [id number]  HISTORICAL INFORMATION:   Selected notes from the [facility] - Referred by Dr. Brandt Loosen Tejan-Sie Pt has been on urology med, [place], for over 5 yrs.  Eval for potential retinal damage.   CURRENT MEDICATIONS: No current outpatient medications on file. (Ophthalmic Drugs)   No current facility-administered medications for this visit. (Ophthalmic Drugs)   Current Outpatient Medications (Other)  Medication Sig   albuterol (PROVENTIL HFA;VENTOLIN HFA) 108 (90 BASE) MCG/ACT inhaler Inhale 1 puff into the lungs as needed. For shortness of breath or wheezing   azelastine (ASTELIN) 0.1 % nasal spray as directed.   Cholecalciferol (VITAMIN D3) 5000 units TABS Take by mouth daily.   diazepam (VALIUM) 10 MG tablet Take 10 mg by mouth daily.   dicyclomine (BENTYL) 20 MG tablet Take 20 mg by mouth every 6 (six) hours.   diltiazem (CARDIZEM CD) 120 MG 24 hr capsule TAKE 1 CAPSULE BY MOUTH ONCE DAILY   diltiazem (CARDIZEM) 30 MG tablet TAKE 1 TABLET BY MOUTH EVERY 8 HOURS AS NEEDED FOR BREAKTHROUGH PALPITATIONS   ELMIRON 100 MG capsule Take 200 mg by mouth 2 (two) times daily.   fexofenadine (ALLEGRA) 180 MG tablet Take 180 mg by mouth every morning.   fluticasone (FLONASE) 50 MCG/ACT nasal spray SHAKE LQ AND U 1 TO 2 SPRAYS IEN QD UTD   Fluticasone-Salmeterol (ADVAIR) 250-50 MCG/DOSE AEPB Inhale 1 puff into the lungs 2 (two) times a day.    furosemide (LASIX) 20 MG tablet Take 1 tablet (20 mg total) by mouth daily as needed.   gabapentin (NEURONTIN) 800 MG tablet Take 800 mg by mouth 3 (three) times  daily.   guaiFENesin (MUCINEX) 600 MG 12 hr tablet Take 1,200 mg by mouth 2 (two) times daily.   hydrOXYzine (ATARAX/VISTARIL) 50 MG tablet Take 50 mg by mouth at bedtime.   hyoscyamine (LEVSIN SL) 0.125 MG SL tablet Place 0.125 mg under the tongue every 4 (four) hours as needed for cramping.   Lactobacillus (ACIDOPHILUS PROBIOTIC PO) Take by mouth 3 (three) times daily.   lidocaine (XYLOCAINE) 5 % ointment Apply topically daily.   methocarbamol (ROBAXIN) 750 MG tablet Take 750 mg by mouth every 8 (eight) hours.   montelukast (SINGULAIR) 10 MG tablet Take 10 mg by mouth at bedtime.   nitrofurantoin (MACRODANTIN) 50 MG capsule Take 50 mg by mouth daily.   oxybutynin (DITROPAN) 5 MG tablet Take 5 mg by mouth 3 (three) times daily as needed.   PROCTOZONE-HC 2.5 % rectal cream Apply topically as needed.   Simethicone 125 MG CAPS Take 125 mg by mouth 4 (four) times daily.   Current Facility-Administered Medications (Other)  Medication Route   betamethasone acetate-betamethasone sodium phosphate (CELESTONE) injection 3 mg Intramuscular   REVIEW OF SYSTEMS:  ALLERGIES Allergies  Allergen Reactions   Amitriptyline Other (See Comments)    Other reaction(s): Other Change in mental status   Amoxicillin Rash   Bromelains     Other reaction(s): Abdominal pain   Celexa [Citalopram Hydrobromide] Other (See Comments)    Other reaction(s): Other  Change in mental status   Ivp Dye [Iodinated Contrast Media] Anaphylaxis    Pt reports "I do not have contrast allergy and have received IVP dye without pre-med with NO reaction."    Nortriptyline     Other reaction(s): Other   Penicillins Swelling and Rash   Pregabalin     Other reaction(s): Myalgias (muscle pain)   Bromelains [Pineapple Extract] Other (See Comments)    Severe pain   Budesonide-Formoterol Fumarate     Other reaction(s): Other   Citalopram Other (See Comments)   Mangifera Indica Swelling   Methylene Blue Other (See Comments)     Caused inflamed pancreas, per pt should not use   Sertraline Other (See Comments)    Other reaction(s): Other Suicidal thoughts   Sulfa Antibiotics Other (See Comments)   Sulfonamide Derivatives Other (See Comments)    excrutiating pain, patient states it caused her to have osteoarthritis   Bromaline Lorretta Harp Bromm] Rash   Other Rash    Mango    PAST MEDICAL HISTORY Past Medical History:  Diagnosis Date   Anxiety 12/14/2011   Asthma    no inhaler   Cataract    Diastolic dysfunction    a. 10/2015 Echo: EF nl, mod diast dysfxn. Mild MR/TR; b. 09/2016 Echo: EF 55-60%, no rwma. Nl RV size and fxn; c. 03/2020 Echo: EF 60-65%. No rwma.   Factor 5 Leiden mutation, heterozygous ([place])    Fibromyalgia    Fracture of 5th metatarsal 03/2011   left foot -     Heart palpitations    a. Notes intermittent tachycardia (sinus). Did not tolerate beta blockers 2/2 urinary incontinence; b. 07/2017 Event monitor: Avg HR 77 (49-136). No significant ectopy/pauses. Triggered events corresponded w/ sinus rhythm, sinus arrhythmias, and artifact.   Interstitial cystitis    Non-cardiac chest pain    a. 12/2015 CTA chest: no PE. No significant coronary Ca2+. Mosaic attenuation pattern in both lungs, may reflect small airway dzs; c. 12/2015 Ex MV: Walked 6 mins. No ischemia/scar; d. 12/2019 Cor CTA: Ca2+ = 0. Nl cors. ? PFO w/o evidence of flow (not seen on echo).   Ovarian cyst    Pancreatitis    Pelvic floor dysfunction    Rheumatoid arteritis ([place])    Sinus infection    chronic   Syncope    a. 08/2017 Event monitor: Average heart rate 77 (49-136).  Periods of sinus arrhythmia noted.  Triggered events corresponded with sinus rhythm, sinus arrhythmia, and artifact.  No significant ectopy, sustained arrhythmias, or prolonged pauses observed.   Thrombosis    a. 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   Past Surgical  History:  Procedure Laterality Date   ABDOMINAL HYSTERECTOMY     CHOLECYSTECTOMY     CYSTOSCOPY WITH HYDRODISTENSION AND BIOPSY  04/21/2011   Dr Philis Fendt   LAPAROSCOPIC ASSISTED VAGINAL HYSTERECTOMY  08/21/2011   Procedure: LAPAROSCOPIC ASSISTED VAGINAL HYSTERECTOMY;  Surgeon: Cyril Mourning, MD;  Location: [facility];  Service: Gynecology;  Laterality: N/A;  OPEN LAPAROSCOPIC   LAPAROSCOPIC NISSEN FUNDOPLICATION  4431   neck ablation Left 12/29/2019   neck ablation Right 01/05/2020   SALPINGOOPHORECTOMY  08/21/2011   Procedure: SALPINGO OOPHERECTOMY;  Surgeon: Cyril Mourning, MD;  Location: [facility];  Service: Gynecology;  Laterality: Bilateral;   TUBAL LIGATION  2010   WISDOM TOOTH EXTRACTION      FAMILY HISTORY Family  History  Problem Relation Age of Onset   Heart Problems Mother    Vascular Disease Mother    Skin cancer Mother    Vascular Disease Maternal Grandmother    Kidney Stones Father    Skin cancer Maternal Uncle    Anesthesia problems Neg Hx     SOCIAL HISTORY Social History   Tobacco Use   Smoking status: Never   Smokeless tobacco: Never  Vaping Use   Vaping Use: Never used  Substance Use Topics   Alcohol use: No   Drug use: No       OPHTHALMIC EXAM: Not recorded     IMAGING AND PROCEDURES  Imaging and Procedures for '@TODAY'$ @          ASSESSMENT/PLAN:  No diagnosis found.   1. No retinal edema on exam or OCT -- stable  - no pigmentary maculopathy or degeneration consistent with Elmiron toxicity  - pt reports taking po Elmiron, 200 mg BID, since 2012  - OCT and autofluorescence without RPE atrophy  - Optos FAF images obtained today 11.1.22  - f/u 1 years, DFE, OCT, Optos FAF  2. Myopia w/ astigmatism OU  3. 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  - discussed diagnosis and progression  - not yet visually significant  - monitor for  now   Ophthalmic Meds Ordered this visit:  No orders of the defined types were placed in this encounter.       No follow-ups on file.  There are no Patient Instructions on file for this visit.  This document serves as a record of services personally performed by Gardiner Sleeper, MD, PhD. It was created on their behalf by Renaldo Reel, [place] an ophthalmic technician. The creation of this record is the provider's dictation and/or activities during the visit.    Electronically signed by:  Renaldo Reel, COT  10.17.23 9:10 AM    Gardiner Sleeper, M.D., Ph.D. Diseases & Surgery of the Retina and Vitreous [facility]: M myopia (nearsighted); A astigmatism; H hyperopia (farsighted); P presbyopia; Mrx spectacle prescription;  CTL contact lenses; OD right eye; OS left eye; OU both eyes  XT exotropia; ET esotropia; PEK punctate epithelial keratitis; PEE punctate epithelial erosions; DES dry eye syndrome; MGD meibomian gland dysfunction; ATs artificial tears; PFAT's preservative free artificial tears; [place] nuclear sclerotic cataract; PSC posterior subcapsular cataract; ERM epi-retinal membrane; PVD posterior vitreous detachment; RD retinal detachment; DM diabetes mellitus; DR diabetic retinopathy; NPDR non-proliferative diabetic retinopathy; PDR proliferative diabetic retinopathy; CSME clinically significant macular edema; DME diabetic macular edema; dbh dot blot hemorrhages; CWS cotton wool spot; POAG primary open angle glaucoma; C/D cup-to-disc ratio; HVF humphrey visual field; GVF goldmann visual field; OCT optical coherence tomography; IOP intraocular pressure; BRVO Branch retinal vein occlusion; CRVO central retinal vein occlusion; CRAO central retinal artery occlusion; BRAO branch retinal artery occlusion; RT retinal tear; SB scleral buckle; PPV pars plana vitrectomy; VH Vitreous hemorrhage; PRP panretinal laser photocoagulation; IVK intravitreal  kenalog; VMT vitreomacular traction; MH Macular hole;  NVD neovascularization of the disc; NVE neovascularization elsewhere; AREDS age related eye disease study; ARMD age related macular degeneration; POAG primary open angle glaucoma; EBMD epithelial/anterior basement membrane dystrophy; ACIOL anterior chamber intraocular lens; IOL intraocular lens; PCIOL posterior chamber intraocular lens; Phaco/IOL phacoemulsification with intraocular lens placement; PRK photorefractive keratectomy; LASIK laser assisted in situ keratomileusis; HTN hypertension; DM  diabetes mellitus; COPD chronic obstructive pulmonary disease

## 2021-11-29 ENCOUNTER — Encounter (INDEPENDENT_AMBULATORY_CARE_PROVIDER_SITE_OTHER): Payer: Self-pay | Admitting: Ophthalmology

## 2021-11-29 ENCOUNTER — Encounter (INDEPENDENT_AMBULATORY_CARE_PROVIDER_SITE_OTHER): Payer: Medicare Other | Admitting: Ophthalmology

## 2021-11-29 ENCOUNTER — Ambulatory Visit (INDEPENDENT_AMBULATORY_CARE_PROVIDER_SITE_OTHER): Payer: Medicare Other | Admitting: Ophthalmology

## 2021-11-29 DIAGNOSIS — H25813 Combined forms of age-related cataract, bilateral: Secondary | ICD-10-CM

## 2021-11-29 DIAGNOSIS — H3552 Pigmentary retinal dystrophy: Secondary | ICD-10-CM | POA: Diagnosis not present

## 2021-11-29 DIAGNOSIS — H5213 Myopia, bilateral: Secondary | ICD-10-CM

## 2021-11-29 NOTE — Progress Notes (Signed)
[facility] Note  11/29/2021     CHIEF COMPLAINT Patient presents for Retina Follow Up    HISTORY OF PRESENT ILLNESS: April [name] is a 51 y.o. female who presents to the clinic today for:   HPI     Retina Follow Up   Patient presents with  Other.  In both eyes.  This started 1.  Duration of years.  I, the attending physician,  performed the HPI with the patient and updated documentation appropriately.        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 Patient states vision doing fine. The same. Just saw regular eye doctor. On flexeril.      Last edited by Bernarda Caffey, MD on 11/30/2021 12:14 AM.    pt is still on Elmiron, she states she had to have sx in September to "coat" her bladder, she states it has helped and she no longer needs to use a heating pad,   Referring physician: Jodi Marble, MD [place],  [place] [address]  HISTORICAL INFORMATION:   Selected notes from the [facility] - Referred by Dr. Brandt Loosen Tejan-Sie Pt has been on urology med, [place], for over 5 yrs.  Eval for potential retinal damage.   CURRENT MEDICATIONS: No current outpatient medications on file. (Ophthalmic Drugs)   No current facility-administered medications for this visit. (Ophthalmic Drugs)   Current Outpatient Medications (Other)  Medication Sig   albuterol (PROVENTIL HFA;VENTOLIN HFA) 108 (90 BASE) MCG/ACT inhaler Inhale 1 puff into the lungs as needed. For shortness of breath or wheezing   azelastine (ASTELIN) 0.1 % nasal spray as directed.   Cholecalciferol (VITAMIN D3) 5000 units TABS Take by mouth daily.   diazepam (VALIUM) 10 MG tablet Take 10 mg by mouth daily.   dicyclomine (BENTYL) 20 MG tablet Take 20 mg by mouth every 6 (six) hours.   diltiazem (CARDIZEM CD) 120 MG 24 hr capsule TAKE 1 CAPSULE BY MOUTH ONCE DAILY   diltiazem (CARDIZEM) 30 MG tablet TAKE 1  TABLET BY MOUTH EVERY 8 HOURS AS NEEDED FOR BREAKTHROUGH PALPITATIONS   ELMIRON 100 MG capsule Take 200 mg by mouth 2 (two) times daily.   fexofenadine (ALLEGRA) 180 MG tablet Take 180 mg by mouth every morning.   fluticasone (FLONASE) 50 MCG/ACT nasal spray SHAKE LQ AND U 1 TO 2 SPRAYS IEN QD UTD   Fluticasone-Salmeterol (ADVAIR) 250-50 MCG/DOSE AEPB Inhale 1 puff into the lungs 2 (two) times a day.    gabapentin (NEURONTIN) 800 MG tablet Take 800 mg by mouth 3 (three) times daily.   guaiFENesin (MUCINEX) 600 MG 12 hr tablet Take 1,200 mg by mouth 2 (two) times daily.   hydrOXYzine (ATARAX/VISTARIL) 50 MG tablet Take 50 mg by mouth at bedtime.   hyoscyamine (LEVSIN SL) 0.125 MG SL tablet Place 0.125 mg under the tongue every 4 (four) hours as needed for cramping.   Lactobacillus (ACIDOPHILUS PROBIOTIC PO) Take by mouth 3 (three) times daily.   lidocaine (XYLOCAINE) 5 % ointment Apply topically daily.   methocarbamol (ROBAXIN) 750 MG tablet Take 750 mg by mouth every 8 (eight) hours.   montelukast (SINGULAIR) 10 MG tablet Take 10 mg by mouth at bedtime.   nitrofurantoin (MACRODANTIN) 50 MG capsule Take 50 mg by mouth daily.   oxybutynin (DITROPAN) 5 MG tablet Take 5 mg by mouth 3 (three) times daily as needed.  PROCTOZONE-HC 2.5 % rectal cream Apply topically as needed.   Simethicone 125 MG CAPS Take 125 mg by mouth 4 (four) times daily.   furosemide (LASIX) 20 MG tablet Take 1 tablet (20 mg total) by mouth daily as needed.   Current Facility-Administered Medications (Other)  Medication Route   betamethasone acetate-betamethasone sodium phosphate (CELESTONE) injection 3 mg Intramuscular   REVIEW OF SYSTEMS: ROS   Positive for: Gastrointestinal, Neurological, Musculoskeletal, Cardiovascular, Eyes Negative for: Constitutional, Skin, Genitourinary, HENT, Endocrine, Respiratory, Psychiatric, Allergic/Imm, Heme/Lymph Last edited by Theodore Demark, COA on 11/29/2021  3:37 PM.      ALLERGIES Allergies  Allergen Reactions   Amitriptyline Other (See Comments)    Other reaction(s): Other Change in mental status   Amoxicillin Rash   Bromelains     Other reaction(s): Abdominal pain   Celexa [Citalopram Hydrobromide] Other (See Comments)    Other reaction(s): Other Change in mental status   Ivp Dye [Iodinated Contrast Media] Anaphylaxis    Pt reports "I do not have contrast allergy and have received IVP dye without pre-med with NO reaction."    Nortriptyline     Other reaction(s): Other   Penicillins Swelling and Rash   Pregabalin     Other reaction(s): Myalgias (muscle pain)   Bromelains [Pineapple Extract] Other (See Comments)    Severe pain   Budesonide-Formoterol Fumarate     Other reaction(s): Other   Citalopram Other (See Comments)   Mangifera Indica Swelling   Methylene Blue Other (See Comments)    Caused inflamed pancreas, per pt should not use   Sertraline Other (See Comments)    Other reaction(s): Other Suicidal thoughts   Sulfa Antibiotics Other (See Comments)   Sulfonamide Derivatives Other (See Comments)    excrutiating pain, patient states it caused her to have osteoarthritis   Bromaline Lorretta Harp Bromm] Rash   Other Rash    Mango    PAST MEDICAL HISTORY Past Medical History:  Diagnosis Date   Anxiety 12/14/2011   Asthma    no inhaler   Cataract    Diastolic dysfunction    a. 10/2015 Echo: EF nl, mod diast dysfxn. Mild MR/TR; b. 09/2016 Echo: EF 55-60%, no rwma. Nl RV size and fxn; c. 03/2020 Echo: EF 60-65%. No rwma.   Factor 5 Leiden mutation, heterozygous ([place])    Fibromyalgia    Fracture of 5th metatarsal 03/2011   left foot -     Heart palpitations    a. Notes intermittent tachycardia (sinus). Did not tolerate beta blockers 2/2 urinary incontinence; b. 07/2017 Event monitor: Avg HR 77 (49-136). No significant ectopy/pauses. Triggered events corresponded w/ sinus rhythm, sinus arrhythmias, and artifact.   Interstitial  cystitis    Non-cardiac chest pain    a. 12/2015 CTA chest: no PE. No significant coronary Ca2+. Mosaic attenuation pattern in both lungs, may reflect small airway dzs; c. 12/2015 Ex MV: Walked 6 mins. No ischemia/scar; d. 12/2019 Cor CTA: Ca2+ = 0. Nl cors. ? PFO w/o evidence of flow (not seen on echo).   Ovarian cyst    Pancreatitis    Pelvic floor dysfunction    Rheumatoid arteritis ([place])    Sinus infection    chronic   Syncope    a. 08/2017 Event monitor: Average heart rate 77 (49-136).  Periods of sinus arrhythmia noted.  Triggered events corresponded with sinus rhythm, sinus arrhythmia, and artifact.  No significant ectopy, sustained arrhythmias, or prolonged pauses observed.   Thrombosis    a. 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   Past Surgical History:  Procedure Laterality Date   ABDOMINAL HYSTERECTOMY     CHOLECYSTECTOMY     CYSTOSCOPY WITH HYDRODISTENSION AND BIOPSY  04/21/2011   Dr Philis Fendt   LAPAROSCOPIC ASSISTED VAGINAL HYSTERECTOMY  08/21/2011   Procedure: LAPAROSCOPIC ASSISTED VAGINAL HYSTERECTOMY;  Surgeon: Cyril Mourning, MD;  Location: [facility];  Service: Gynecology;  Laterality: N/A;  OPEN LAPAROSCOPIC   LAPAROSCOPIC NISSEN FUNDOPLICATION  0160   neck ablation Left 12/29/2019   neck ablation Right 01/05/2020   SALPINGOOPHORECTOMY  08/21/2011   Procedure: SALPINGO OOPHERECTOMY;  Surgeon: Cyril Mourning, MD;  Location: [facility];  Service: Gynecology;  Laterality: Bilateral;   TUBAL LIGATION  2010   WISDOM TOOTH EXTRACTION      FAMILY HISTORY Family History  Problem Relation Age of Onset   Heart Problems Mother    Vascular Disease Mother    Skin cancer Mother    Vascular Disease Maternal Grandmother    Kidney Stones Father    Skin cancer Maternal Uncle    Anesthesia problems Neg Hx     SOCIAL HISTORY Social History   Tobacco Use   Smoking status: Never    Smokeless tobacco: Never  Vaping Use   Vaping Use: Never used  Substance Use Topics   Alcohol use: No   Drug use: No       OPHTHALMIC EXAM: Base Eye Exam     Visual Acuity (Snellen - Linear)       Right Left   Dist cc 20/20 20/20 -1    Correction: Glasses         Tonometry (Tonopen, 3:34 PM)       Right Left   Pressure 12 12         Pupils       Dark Light Shape React APD   Right 4 3 Round Brisk None   Left 4 3 Round Brisk None         Visual Fields (Counting fingers)       Left Right    Full Full         Extraocular Movement       Right Left    Full, Ortho Full, Ortho         Neuro/Psych     Oriented x3: Yes   Mood/Affect: Normal         Dilation     Both eyes: 1.0% Mydriacyl, 2.5% Phenylephrine @ 3:34 PM           Slit Lamp and Fundus Exam     Slit Lamp Exam       Right Left   Lids/Lashes Dermatochalasis - upper lid, Meibomian gland dysfunction Dermatochalasis - upper lid, Meibomian gland dysfunction   Conjunctiva/Sclera White and quiet White and quiet   Cornea Trace Punctate epithelial erosions, no guttata Trace Punctate epithelial erosions greatest inferiorly, no guttata   Anterior Chamber Deep and quiet Deep and quiet   Iris Round and dilated Round and dilated   Lens 2+ Nuclear sclerosis, 2+ Cortical cataract 2+ Nuclear sclerosis, 2+ Cortical cataract   Anterior Vitreous mild Vitreous syneresis mild Vitreous syneresis         Fundus Exam       Right Left   Disc Pink and Sharp, mild tilt, Compact, mild PPP Pink and Sharp, mild inferior PPP, Compact   C/D  Ratio 0.1 0.2   Macula Flat, good foveal reflex, mild RPE mottling and clumping, No heme or edema Flat, good foveal reflex, mild Retinal pigment epithelial mottling and clumping, No heme or edema   Vessels mild attenuation, mild tortuosity mild attenuation, mild tortuosity   Periphery Attached, focal flame heme nasal to disc, mild inferior pavingstone degeneration  Attached, pigmented pavingstone degeneration inferiorly, No heme           Refraction     Wearing Rx       Sphere Cylinder Axis   Right -5.50 +0.50 169   Left -6.00 +0.75 025           IMAGING AND PROCEDURES  Imaging and Procedures for '@TODAY'$ @  OCT, Retina - OU - Both Eyes       Right Eye Quality was good. Central Foveal Thickness: 273. Progression has been stable. Findings include normal foveal contour, no IRF, no SRF (Normal ellipsoid signal, no toxicity).   Left Eye Quality was good. Central Foveal Thickness: 277. Progression has been stable. Findings include normal foveal contour, no IRF, no SRF, vitreomacular adhesion (Normal ellipsoid signal, no toxicity).   Notes *Images captured and stored on drive  Diagnosis / Impression:  NFP, no IRF/SRF OU  No pigmentary maculopathy or outer retinal atrophy -- no Elmiron toxicity  Clinical management:  See below  Abbreviations: NFP - Normal foveal profile. CME - cystoid macular edema. PED - pigment epithelial detachment. IRF - intraretinal fluid. SRF - subretinal fluid. EZ - ellipsoid zone. ERM - epiretinal membrane. ORA - outer retinal atrophy. ORT - outer retinal tubulation. SRHM - subretinal hyper-reflective material            ASSESSMENT/PLAN:    ICD-10-CM   1. 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 Myopia of both eyes with astigmatism  H52.13    H52.203     3. Combined forms of age-related cataract of both eyes  H25.813      1. History of long-term Elmiron use -- no retinal toxicity or pigmentary retinopathy -- stable  - pt reports taking po Elmiron, 200 mg BID, since 2012  - OCT and autofluorescence without RPE atrophy  - BCVA remains 20/20 OU  - Optos FAF images obtained 11.1.22  - f/u 1 years, DFE, OCT, Optos FAF  2. Myopia w/ astigmatism OU  3. Mixed form age related cataract OU  - The symptoms of cataract, surgical options, and treatments and risks were discussed  with patient.  - discussed diagnosis and progression  - not yet visually significant  - monitor for now  Ophthalmic Meds Ordered this visit:  No orders of the defined types were placed in this encounter.    Return in about 1 year (around 11/30/2022) for f/u 1 year, pigmentary retinopathy OU, DFE, OCT.  There are no Patient Instructions on file for this visit.  This document serves as a record of services personally performed by Gardiner Sleeper, MD, PhD. It was created on their behalf by San Jetty. Owens Shark, OA an ophthalmic technician. The creation of this record is the provider's dictation and/or activities during the visit.    Electronically signed by: San Jetty. [place], [place] 10.31.2023 12:15 AM  Gardiner Sleeper, M.D., Ph.D. Diseases & Surgery of the Retina and Vitreous [facility]  I have reviewed the above documentation for accuracy and completeness, and I agree with the above. Gardiner Sleeper, M.D., Ph.D. 11/30/21  12:17 AM   Abbreviations: M myopia (nearsighted); A astigmatism; H hyperopia (farsighted); P presbyopia; Mrx spectacle prescription;  CTL contact lenses; OD right eye; OS left eye; OU both eyes  XT exotropia; ET esotropia; PEK punctate epithelial keratitis; PEE punctate epithelial erosions; DES dry eye syndrome; MGD meibomian gland dysfunction; ATs artificial tears; PFAT's preservative free artificial tears; [place] nuclear sclerotic cataract; PSC posterior subcapsular cataract; ERM epi-retinal membrane; PVD posterior vitreous detachment; RD retinal detachment; DM diabetes mellitus; DR diabetic retinopathy; NPDR non-proliferative diabetic retinopathy; PDR proliferative diabetic retinopathy; CSME clinically significant macular edema; DME diabetic macular edema; dbh dot blot hemorrhages; CWS cotton wool spot; POAG primary open angle glaucoma; C/D cup-to-disc ratio; HVF humphrey visual field; GVF goldmann visual field; OCT optical coherence tomography; IOP intraocular  pressure; BRVO Branch retinal vein occlusion; CRVO central retinal vein occlusion; CRAO central retinal artery occlusion; BRAO branch retinal artery occlusion; RT retinal tear; SB scleral buckle; PPV pars plana vitrectomy; VH Vitreous hemorrhage; PRP panretinal laser photocoagulation; IVK intravitreal kenalog; VMT vitreomacular traction; MH Macular hole;  NVD neovascularization of the disc; NVE neovascularization elsewhere; AREDS age related eye disease study; ARMD age related macular degeneration; POAG primary open angle glaucoma; EBMD epithelial/anterior basement membrane dystrophy; ACIOL anterior chamber intraocular lens; IOL intraocular lens; PCIOL posterior chamber intraocular lens; Phaco/IOL phacoemulsification with intraocular lens placement; [place] photorefractive keratectomy; LASIK laser assisted in situ keratomileusis; HTN hypertension; DM diabetes mellitus; COPD chronic obstructive pulmonary disease

## 2021-11-30 ENCOUNTER — Encounter (INDEPENDENT_AMBULATORY_CARE_PROVIDER_SITE_OTHER): Payer: Self-pay | Admitting: Ophthalmology

## 2021-12-14 ENCOUNTER — Telehealth: Payer: Self-pay | Admitting: Internal Medicine

## 2021-12-14 NOTE — Telephone Encounter (Signed)
Pt called stating she is experiencing increased episodes of elevated HR daily. She reports today BP was 115/94 and HR 115. She stated she took her normal dose of diltiazem 120 mg and PRN dose of 30 mg but feels like medication is not helping. Pt report she is currently experiencing shingles outbreak and had a fever earlier today.  Pt also report she is experiencing ankle swelling and SOB with minimal exertion such as talking. She states she's been without her lasix and just picked up a refill today.   Pt stated she is very concerned they something is going on with her heart. Denies symptoms currently and seeking advice from MD

## 2021-12-14 NOTE — Telephone Encounter (Signed)
Patient stated she is having headaches and her BP and HR are off.  Patient stated she wants advice on how to address these concerns.

## 2021-12-15 NOTE — Telephone Encounter (Signed)
Please let April [name] know that it is normal to see elevations in her heart rate with an acute illness, particularly if she is running a fever.  Using a fever-reducer such as acetaminophen or ibuprofen would be the first step.  Her dyspnea could also be due to an infectious process, as extensive cardiac testing in the past has not revealed any significant abnormalities with her heart.  If her symptoms persist, I suggest that she see her PCP for further evaluation.  [name], MD [facility]

## 2021-12-15 NOTE — Telephone Encounter (Signed)
Pt made aware of MD's recommendations and verbalized understanding.  

## 2022-01-05 ENCOUNTER — Other Ambulatory Visit: Payer: Self-pay | Admitting: Internal Medicine

## 2022-01-10 ENCOUNTER — Other Ambulatory Visit: Payer: Self-pay | Admitting: Internal Medicine

## 2022-01-10 NOTE — Telephone Encounter (Signed)
Please contact p/t for over due 1 month follow up, last seen 06-16-21 Dr, End. Needed 1 month follow up with App.  Refill request pending appt. Thank you

## 2022-01-11 ENCOUNTER — Encounter: Payer: Self-pay | Admitting: *Deleted

## 2022-01-17 ENCOUNTER — Other Ambulatory Visit: Payer: Self-pay | Admitting: Internal Medicine

## 2022-01-19 ENCOUNTER — Ambulatory Visit: Payer: Medicare Other | Admitting: Internal Medicine

## 2022-01-19 NOTE — Progress Notes (Deleted)
Follow-up Outpatient Visit Date: 01/20/2022  Primary Care Provider: Jodi Marble, MD [address] [place] [address]  Chief Complaint: ***  HPI:  April [name] is a 51 y.o. 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  She was last seen in our office by April [name] in June after a syncopal episode.  She had previously been instructed to stop furosemide, but she decided to restart it on her own due to weight gain.  It was thought that syncope may have been precipitated by dehydration.  --------------------------------------------------------------------------------------------------  Past Medical History:  Diagnosis Date   Anxiety 12/14/2011   Asthma    no inhaler   Cataract    Diastolic dysfunction    a. 10/2015 Echo: EF nl, mod diast dysfxn. Mild MR/TR; b. 09/2016 Echo: EF 55-60%, no rwma. Nl RV size and fxn; c. 03/2020 Echo: EF 60-65%. No rwma.   Factor 5 Leiden mutation, heterozygous ([place])    Fibromyalgia    Fracture of 5th metatarsal 03/2011   left foot -     Heart palpitations    a. Notes intermittent tachycardia (sinus). Did not tolerate beta blockers 2/2 urinary incontinence; b. 07/2017 Event monitor: Avg HR 77 (49-136). No significant ectopy/pauses. Triggered events corresponded w/ sinus rhythm, sinus arrhythmias, and artifact.   Interstitial cystitis    Non-cardiac chest pain    a. 12/2015 CTA chest: no PE. No significant coronary Ca2+. Mosaic attenuation pattern in both lungs, may reflect small airway dzs; c. 12/2015 Ex MV: Walked 6 mins. No ischemia/scar; d. 12/2019 Cor CTA: Ca2+ = 0. Nl cors. ? PFO w/o evidence of flow (not seen on echo).   Ovarian cyst    Pancreatitis    Pelvic floor dysfunction     Rheumatoid arteritis ([place])    Sinus infection    chronic   Syncope    a. 08/2017 Event monitor: Average heart rate 77 (49-136).  Periods of sinus arrhythmia noted.  Triggered events corresponded with sinus rhythm, sinus arrhythmia, and artifact.  No significant ectopy, sustained arrhythmias, or prolonged pauses observed.   Thrombosis    a. 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   Past Surgical History:  Procedure Laterality Date   ABDOMINAL HYSTERECTOMY     CHOLECYSTECTOMY     CYSTOSCOPY WITH HYDRODISTENSION AND BIOPSY  04/21/2011   Dr Philis Fendt   LAPAROSCOPIC ASSISTED VAGINAL HYSTERECTOMY  08/21/2011   Procedure: LAPAROSCOPIC ASSISTED VAGINAL HYSTERECTOMY;  Surgeon: Cyril Mourning, MD;  Location: [facility];  Service: Gynecology;  Laterality: N/A;  OPEN LAPAROSCOPIC   LAPAROSCOPIC NISSEN FUNDOPLICATION  4818   neck ablation Left 12/29/2019   neck ablation Right 01/05/2020   SALPINGOOPHORECTOMY  08/21/2011   Procedure: SALPINGO OOPHERECTOMY;  Surgeon: Cyril Mourning, MD;  Location: [facility];  Service: Gynecology;  Laterality: Bilateral;   TUBAL LIGATION  2010   WISDOM TOOTH EXTRACTION      No outpatient medications have been marked as taking for the 01/20/22 encounter (Appointment) with April [name], April Gave, MD.   Current Facility-Administered Medications for the 01/20/22 encounter (Appointment) with April [name], April Gave, MD  Medication   betamethasone acetate-betamethasone sodium phosphate (CELESTONE) injection 3 mg  Allergies: Amitriptyline, Amoxicillin, Bromelains, Celexa [citalopram hydrobromide], Ivp dye [iodinated contrast media], Nortriptyline, Penicillins, Pregabalin, Bromelains [pineapple extract], Budesonide-formoterol fumarate, Citalopram, Mangifera indica, Methylene blue, Sertraline, Sulfa antibiotics, Sulfonamide derivatives, Bromaline [albertsons di bromm], and Other  Social History    Tobacco Use   Smoking status: Never   Smokeless tobacco: Never  Vaping Use   Vaping Use: Never used  Substance Use Topics   Alcohol use: No   Drug use: No    Family History  Problem Relation Age of Onset   Heart Problems Mother    Vascular Disease Mother    Skin cancer Mother    Vascular Disease Maternal Grandmother    Kidney Stones Father    Skin cancer Maternal Uncle    Anesthesia problems Neg Hx     Review of Systems: A 12-system review of systems was performed and was negative except as noted in the HPI.  --------------------------------------------------------------------------------------------------  Physical Exam: LMP 05/20/2011   General:  NAD. Neck: No JVD or HJR. Lungs: Clear to auscultation bilaterally without wheezes or crackles. Heart: Regular rate and rhythm without murmurs, rubs, or gallops. Abdomen: Soft, nontender, nondistended. Extremities: No lower extremity edema.  EKG:  ***  Lab Results  Component Value Date   WBC 9.4 06/05/2021   HGB 13.0 06/05/2021   HCT 37.8 06/05/2021   MCV 96.7 06/05/2021   PLT 268 06/05/2021    Lab Results  Component Value Date   NA 134 (L) 07/27/2021   K 4.1 07/27/2021   CL 99 07/27/2021   CO2 28 07/27/2021   BUN 5 (L) 07/27/2021   CREATININE 0.75 07/27/2021   GLUCOSE 100 (H) 07/27/2021   ALT 24 06/05/2021    No results found for: "CHOL", "HDL", "LDLCALC", "LDLDIRECT", "TRIG", "CHOLHDL"  --------------------------------------------------------------------------------------------------  ASSESSMENT AND PLAN: ***  [name], MD 01/19/2022 5:01 PM

## 2022-01-19 NOTE — Progress Notes (Deleted)
Follow-up Outpatient Visit Date: 01/19/2022  Primary Care Provider: Jodi Marble, MD [address] [place] [address]  Chief Complaint: ***  HPI:  April [name] is a 51 y.o. 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 Bynum Bellows @ [facility]) who was on Xarelto for this (discontinued due to severe anemia), fibromyalgia, asthma and rheumatoid arthritis, who presents for follow-up of palpitations.  She was last seen in our office by Ambrose Pancoast in June after a syncopal episode.  She had previously been instructed to stop furosemide, but she decided to restart it on her own due to weight gain.  It was thought that syncope may have been precipitated by dehydration.  --------------------------------------------------------------------------------------------------  Past Medical History:  Diagnosis Date   Anxiety 12/14/2011   Asthma    no inhaler   Cataract    Diastolic dysfunction    a. 10/2015 Echo: EF nl, mod diast dysfxn. Mild MR/TR; b. 09/2016 Echo: EF 55-60%, no rwma. Nl RV size and fxn; c. 03/2020 Echo: EF 60-65%. No rwma.   Factor 5 Leiden mutation, heterozygous ([place])    Fibromyalgia    Fracture of 5th metatarsal 03/2011   left foot -     Heart palpitations    a. Notes intermittent tachycardia (sinus). Did not tolerate beta blockers 2/2 urinary incontinence; b. 07/2017 Event monitor: Avg HR 77 (49-136). No significant ectopy/pauses. Triggered events corresponded w/ sinus rhythm, sinus arrhythmias, and artifact.   Interstitial cystitis    Non-cardiac chest pain    a. 12/2015 CTA chest: no PE. No significant coronary Ca2+. Mosaic attenuation pattern in both lungs, may reflect small airway dzs; c. 12/2015 Ex MV: Walked 6 mins. No ischemia/scar; d. 12/2019 Cor CTA: Ca2+ = 0. Nl cors. ? PFO w/o evidence of flow (not seen on echo).   Ovarian cyst    Pancreatitis    Pelvic floor dysfunction     Rheumatoid arteritis ([place])    Sinus infection    chronic   Syncope    a. 08/2017 Event monitor: Average heart rate 77 (49-136).  Periods of sinus arrhythmia noted.  Triggered events corresponded with sinus rhythm, sinus arrhythmia, and artifact.  No significant ectopy, sustained arrhythmias, or prolonged pauses observed.   Thrombosis    a. 12/14/2011 Thrombosis of R gonadal vein with extension of thrombus into IVC to level of R renal vein per CT 12/11/12 of [place].   Past Surgical History:  Procedure Laterality Date   ABDOMINAL HYSTERECTOMY     CHOLECYSTECTOMY     CYSTOSCOPY WITH HYDRODISTENSION AND BIOPSY  04/21/2011   Dr Philis Fendt   LAPAROSCOPIC ASSISTED VAGINAL HYSTERECTOMY  08/21/2011   Procedure: LAPAROSCOPIC ASSISTED VAGINAL HYSTERECTOMY;  Surgeon: Cyril Mourning, MD;  Location: [facility];  Service: Gynecology;  Laterality: N/A;  OPEN LAPAROSCOPIC   LAPAROSCOPIC NISSEN FUNDOPLICATION  6789   neck ablation Left 12/29/2019   neck ablation Right 01/05/2020   SALPINGOOPHORECTOMY  08/21/2011   Procedure: SALPINGO OOPHERECTOMY;  Surgeon: Cyril Mourning, MD;  Location: [facility];  Service: Gynecology;  Laterality: Bilateral;   TUBAL LIGATION  2010   WISDOM TOOTH EXTRACTION      No outpatient medications have been marked as taking for the 01/19/22 encounter (Appointment) with Alethia Melendrez, Harrell Gave, MD.   Current Facility-Administered Medications for the 01/19/22 encounter (Appointment) with Arnitra Sokoloski, Harrell Gave, MD  Medication   betamethasone acetate-betamethasone sodium phosphate (CELESTONE) injection 3 mg  Allergies: Amitriptyline, Amoxicillin, Bromelains, Celexa [citalopram hydrobromide], Ivp dye [iodinated contrast media], Nortriptyline, Penicillins, Pregabalin, Bromelains [pineapple extract], Budesonide-formoterol fumarate, Citalopram, Mangifera indica, Methylene blue, Sertraline, Sulfa antibiotics, Sulfonamide derivatives, Bromaline [albertsons di bromm], and Other  Social History    Tobacco Use   Smoking status: Never   Smokeless tobacco: Never  Vaping Use   Vaping Use: Never used  Substance Use Topics   Alcohol use: No   Drug use: No    Family History  Problem Relation Age of Onset   Heart Problems Mother    Vascular Disease Mother    Skin cancer Mother    Vascular Disease Maternal Grandmother    Kidney Stones Father    Skin cancer Maternal Uncle    Anesthesia problems Neg Hx     Review of Systems: A 12-system review of systems was performed and was negative except as noted in the HPI.  --------------------------------------------------------------------------------------------------  Physical Exam: LMP 05/20/2011   General:  NAD. Neck: No JVD or HJR. Lungs: Clear to auscultation bilaterally without wheezes or crackles. Heart: Regular rate and rhythm without murmurs, rubs, or gallops. Abdomen: Soft, nontender, nondistended. Extremities: No lower extremity edema.  EKG:  ***  Lab Results  Component Value Date   WBC 9.4 06/05/2021   HGB 13.0 06/05/2021   HCT 37.8 06/05/2021   MCV 96.7 06/05/2021   PLT 268 06/05/2021    Lab Results  Component Value Date   NA 134 (L) 07/27/2021   K 4.1 07/27/2021   CL 99 07/27/2021   CO2 28 07/27/2021   BUN 5 (L) 07/27/2021   CREATININE 0.75 07/27/2021   GLUCOSE 100 (H) 07/27/2021   ALT 24 06/05/2021    No results found for: "CHOL", "HDL", "LDLCALC", "LDLDIRECT", "TRIG", "CHOLHDL"  --------------------------------------------------------------------------------------------------  ASSESSMENT AND PLAN: ***  [name], MD 01/19/2022 1:01 PM

## 2022-01-20 ENCOUNTER — Ambulatory Visit: Payer: Medicare Other | Admitting: Internal Medicine

## 2022-01-24 DIAGNOSIS — E785 Hyperlipidemia, unspecified: Secondary | ICD-10-CM | POA: Diagnosis not present

## 2022-01-24 DIAGNOSIS — L309 Dermatitis, unspecified: Secondary | ICD-10-CM | POA: Diagnosis not present

## 2022-01-24 DIAGNOSIS — J45998 Other asthma: Secondary | ICD-10-CM | POA: Diagnosis not present

## 2022-01-24 DIAGNOSIS — M79601 Pain in right arm: Secondary | ICD-10-CM | POA: Diagnosis not present

## 2022-01-24 DIAGNOSIS — B029 Zoster without complications: Secondary | ICD-10-CM | POA: Diagnosis not present

## 2022-01-24 DIAGNOSIS — E039 Hypothyroidism, unspecified: Secondary | ICD-10-CM | POA: Diagnosis not present

## 2022-01-24 DIAGNOSIS — J301 Allergic rhinitis due to pollen: Secondary | ICD-10-CM | POA: Diagnosis not present

## 2022-01-24 DIAGNOSIS — M797 Fibromyalgia: Secondary | ICD-10-CM | POA: Diagnosis not present

## 2022-01-29 DIAGNOSIS — M79641 Pain in right hand: Secondary | ICD-10-CM | POA: Diagnosis not present

## 2022-02-20 ENCOUNTER — Telehealth: Payer: Self-pay | Admitting: Internal Medicine

## 2022-02-20 MED ORDER — DILTIAZEM HCL 30 MG PO TABS: ORAL_TABLET | ORAL | 0 refills | Status: DC

## 2022-02-20 NOTE — Telephone Encounter (Signed)
*  STAT* If patient is at the pharmacy, call can be transferred to refill team.   1. Which medications need to be refilled? (please list name of each medication and dose if known) Diltiazem '30mg'$    2. Which pharmacy/location (including street and city if local pharmacy) is medication to be sent to? [facility]  3. Do they need a 30 day or 90 day supply? 90 day

## 2022-02-20 NOTE — Telephone Encounter (Signed)
Requested Prescriptions   Signed Prescriptions Disp Refills   diltiazem (CARDIZEM) 30 MG tablet 30 tablet 0    Sig: TAKE 1 TABLET BY MOUTH EVERY 8 HOURS AS NEEDED FOR BREAKTHROUGH PALPITATIONS    Authorizing Provider: END, CHRISTOPHER    Ordering User: NEWCOMER MCCLAIN, Cayley Pester L

## 2022-02-23 DIAGNOSIS — G5621 Lesion of ulnar nerve, right upper limb: Secondary | ICD-10-CM | POA: Diagnosis not present

## 2022-03-03 ENCOUNTER — Ambulatory Visit: Payer: 59 | Attending: Internal Medicine | Admitting: Medical

## 2022-03-03 VITALS — BP 112/86 | HR 73 | Ht 65.5 in | Wt 195.6 lb

## 2022-03-03 DIAGNOSIS — R002 Palpitations: Secondary | ICD-10-CM

## 2022-03-03 DIAGNOSIS — R0789 Other chest pain: Secondary | ICD-10-CM

## 2022-03-03 MED ORDER — DILTIAZEM HCL ER COATED BEADS 120 MG PO CP24: 120.0000 mg | ORAL_CAPSULE | Freq: Two times a day (BID) | ORAL | 1 refills | Status: DC

## 2022-03-03 NOTE — Progress Notes (Signed)
Cardiology Office Note:    Date:  03/03/2022   ID:  NONIE LOCHNER, DOB [birth year], MRN [id number]  PCP:  Jodi Marble, MD  [facility] Cardiologist:  [name], MD  [facility] Electrophysiologist:  None   Referring MD: Jodi Marble, MD   Chief Complaint: 6 month follow-up  History of Present Illness:    April [name] is a 52 y.o. female with a hx of gonadal vein thrombosis, Favtor 5 leidin mutuation, bladder stimulator, diastolic dysfunction, anemia, myalgia and atypical chest pain.  The patient has a pmh of goinadal vein thrombosis with extension into the short segment of the vena cava that is currently followed at [facility]. Echo in 10/2014 for dyspnea showed normal LVEF with moderate diastolic dysfunction. She underwent stress testing for complaint of atypical chest pain 22/2017 which did not show evidence of ischemia. She has a h/o palpitations that was been managed with Cardizem. Echo from 03/2020 showed LVEF 60-65% with normal LVEF. Patient had a coronary CT completed that showed a calcium score of 0 with normal coronary arteries and she was seen by the structural heart team for questionable PFO that was felt not significant for repair. She was seen by Dr. Caryl Comes 12/2020 for possible POTS syndrome. Her BP was elevated and she was found to not have orthostatic tachycardia response and Al[employer] home monitor was suggested.   She went to the ER 05/2021 with chest discomfort and cardiac work-up was negative.   Last seen 06/2021 for follow-up for hyponatremia. The patient had restarted lasix for weight gain  Today, the patient reports increased palpitations. She has episodes of palpitations, headache and chest discomfort. Vitals during the episodes have been normal. Hr was up to 103bpm. She says chest feels achy. EKG today shows NSR with no changes. She takes diltiazem '120mg'$  daily.   Past Medical History:  Diagnosis Date   Anxiety 12/14/2011   Asthma    no inhaler    Cataract    Diastolic dysfunction    a. 10/2015 Echo: EF nl, mod diast dysfxn. Mild MR/TR; b. 09/2016 Echo: EF 55-60%, no rwma. Nl RV size and fxn; c. 03/2020 Echo: EF 60-65%. No rwma.   Factor 5 Leiden mutation, heterozygous ([place])    Fibromyalgia    Fracture of 5th metatarsal 03/2011   left foot -     Heart palpitations    a. Notes intermittent tachycardia (sinus). Did not tolerate beta blockers 2/2 urinary incontinence; b. 07/2017 Event monitor: Avg HR 77 (49-136). No significant ectopy/pauses. Triggered events corresponded w/ sinus rhythm, sinus arrhythmias, and artifact.   Interstitial cystitis    Non-cardiac chest pain    a. 12/2015 CTA chest: no PE. No significant coronary Ca2+. Mosaic attenuation pattern in both lungs, may reflect small airway dzs; c. 12/2015 Ex MV: Walked 6 mins. No ischemia/scar; d. 12/2019 Cor CTA: Ca2+ = 0. Nl cors. ? PFO w/o evidence of flow (not seen on echo).   Ovarian cyst    Pancreatitis    Pelvic floor dysfunction    Rheumatoid arteritis ([place])    Sinus infection    chronic   Syncope    a. 08/2017 Event monitor: Average heart rate 77 (49-136).  Periods of sinus arrhythmia noted.  Triggered events corresponded with sinus rhythm, sinus arrhythmia, and artifact.  No significant ectopy, sustained arrhythmias, or prolonged pauses observed.   Thrombosis    a. 12/14/2011 Thrombosis of R gonadal vein with extension of thrombus into IVC to level of R  renal vein per CT 12/11/12 of [place].    Past Surgical History:  Procedure Laterality Date   ABDOMINAL HYSTERECTOMY     CHOLECYSTECTOMY     CYSTOSCOPY WITH HYDRODISTENSION AND BIOPSY  04/21/2011   Dr Philis Fendt   LAPAROSCOPIC ASSISTED VAGINAL HYSTERECTOMY  08/21/2011   Procedure: LAPAROSCOPIC ASSISTED VAGINAL HYSTERECTOMY;  Surgeon: Cyril Mourning, MD;  Location: [facility];  Service: Gynecology;  Laterality: N/A;  OPEN LAPAROSCOPIC   LAPAROSCOPIC NISSEN FUNDOPLICATION  4401   neck ablation Left 12/29/2019   neck  ablation Right 01/05/2020   SALPINGOOPHORECTOMY  08/21/2011   Procedure: SALPINGO OOPHERECTOMY;  Surgeon: Cyril Mourning, MD;  Location: [facility];  Service: Gynecology;  Laterality: Bilateral;   TUBAL LIGATION  2010   WISDOM TOOTH EXTRACTION      Current Medications: Current Meds  Medication Sig   Acetaminophen (TYLENOL PO) Take by mouth as needed.   albuterol (PROVENTIL HFA;VENTOLIN HFA) 108 (90 BASE) MCG/ACT inhaler Inhale 1 puff into the lungs as needed. For shortness of breath or wheezing   azelastine (ASTELIN) 0.1 % nasal spray as directed.   Cholecalciferol (VITAMIN D3) 5000 units TABS Take by mouth daily.   Coenzyme Q10 (CO Q 10 PO) Take 200 mg by mouth daily.   Cyanocobalamin (VITAMIN B-12) 5000 MCG SUBL Take 5,000 mcg by mouth daily.   cyclobenzaprine (FLEXERIL) 10 MG tablet Take 10 mg by mouth 3 (three) times daily.   diazepam (VALIUM) 10 MG tablet Take 10 mg by mouth daily.   dicyclomine (BENTYL) 20 MG tablet Take 20 mg by mouth every 6 (six) hours.   diltiazem (CARDIZEM) 30 MG tablet TAKE 1 TABLET BY MOUTH EVERY 8 HOURS AS NEEDED FOR BREAKTHROUGH PALPITATIONS   ELMIRON 100 MG capsule Take 200 mg by mouth 2 (two) times daily.   fexofenadine (ALLEGRA) 180 MG tablet Take 180 mg by mouth every morning.   fluticasone (FLONASE) 50 MCG/ACT nasal spray SHAKE LQ AND U 1 TO 2 SPRAYS IEN QD UTD   Fluticasone-Salmeterol (ADVAIR) 250-50 MCG/DOSE AEPB Inhale 1 puff into the lungs 2 (two) times a day.    gabapentin (NEURONTIN) 800 MG tablet Take 800 mg by mouth 3 (three) times daily.   guaiFENesin (MUCINEX) 600 MG 12 hr tablet Take 1,200 mg by mouth 2 (two) times daily.   hydrOXYzine (ATARAX/VISTARIL) 50 MG tablet Take 50 mg by mouth at bedtime.   hyoscyamine (LEVSIN SL) 0.125 MG SL tablet Place 0.125 mg under the tongue every 4 (four) hours as needed for cramping.   IBUPROFEN PO Take by mouth as needed.   Lactobacillus (ACIDOPHILUS PROBIOTIC PO) Take by mouth 3 (three) times daily.    lidocaine (XYLOCAINE) 5 % ointment Apply topically daily.   methocarbamol (ROBAXIN) 750 MG tablet Take 750 mg by mouth every 8 (eight) hours.   montelukast (SINGULAIR) 10 MG tablet Take 10 mg by mouth at bedtime.   Multiple Vitamins-Minerals (MULTIVITAMIN ADULTS PO) Take 1 tablet by mouth daily.   nitrofurantoin (MACRODANTIN) 50 MG capsule Take 50 mg by mouth daily.   oxybutynin (DITROPAN) 5 MG tablet Take 5 mg by mouth 3 (three) times daily as needed.   PROCTOZONE-HC 2.5 % rectal cream Apply topically as needed.   pseudoephedrine (SUDAFED) 30 MG tablet Take 30 mg by mouth every 6 (six) hours as needed.   Simethicone 125 MG CAPS Take 125 mg by mouth 4 (four) times daily.   Zinc 50 MG TABS Take 50 mg by mouth daily.   [DISCONTINUED] diltiazem (  CARDIZEM CD) 120 MG 24 hr capsule TAKE 1 CAPSULE BY MOUTH ONCE DAILY   Current Facility-Administered Medications for the 03/03/22 encounter (Office Visit) with Kathlen Mody, Keyonia Gluth H, PA-C  Medication   betamethasone acetate-betamethasone sodium phosphate (CELESTONE) injection 3 mg     Allergies:   Amitriptyline, Amoxicillin, Bromelains, Celexa [citalopram hydrobromide], Ivp dye [iodinated contrast media], Nortriptyline, Penicillins, Pregabalin, Bromelains [pineapple extract], Budesonide-formoterol fumarate, Citalopram, Mangifera indica, Methylene blue, Sertraline, Sulfa antibiotics, Sulfonamide derivatives, Bromaline [albertsons di bromm], and Other   Social History   Socioeconomic History   Marital status: Married    Spouse name: Not on file   Number of children: Not on file   Years of education: Not on file   Highest education level: Not on file  Occupational History   Not on file  Tobacco Use   Smoking status: Never   Smokeless tobacco: Never  Vaping Use   Vaping Use: Never used  Substance and Sexual Activity   Alcohol use: No   Drug use: No   Sexual activity: Yes    Birth control/protection: Surgical  Other Topics Concern   Not on file   Social History Narrative   Not on file   Social Determinants of Health   Financial Resource Strain: Not on file  Food Insecurity: Not on file  Transportation Needs: Not on file  Physical Activity: Not on file  Stress: Not on file  Social Connections: Not on file     Family History: The patient's family history includes Heart Problems in her mother; Kidney Stones in her father; Skin cancer in her maternal uncle and mother; Vascular Disease in her maternal grandmother and mother. There is no history of Anesthesia problems.  ROS:   Please see the history of present illness.     All other systems reviewed and are negative.  EKGs/Labs/Other Studies Reviewed:    The following studies were reviewed today:  Echo 2022 1. Left ventricular ejection fraction, by estimation, is 60 to 65%. The  left ventricle has normal function. The left ventricle has no regional  wall motion abnormalities. Left ventricular diastolic parameters were  normal.   2. Right ventricular systolic function is normal. The right ventricular  size is normal.   3. The inferior vena cava is dilated in size with >50% respiratory  variability, suggesting right atrial pressure of 8 mmHg.   Cardiac CTA 12/2019   IMPRESSION: 1. No evidence of CAD, CADRADS = 0.   2. Coronary calcium score of 0. This was 0 percentile for age and sex matched control.   3. Normal coronary origin with left dominance.     Electronically Signed   By: Buford Dresser M.D.   On: 12/18/2019 18:20  Heart monitor 2019 The patient was enrolled for 30 days; 84% of the monitoring period yielded diagnostic tracings. The predominant rhythm was sinus with an average rate of 77 bpm (range 49-136 bpm). Periods of sinus arrhythmia were noted. No significant ectopy, sustained arrhythmia, or prolonged pause was observed. Patient triggered events correspond to sinus rhythm, sinus arrhythmia, and artifact.   Predominantly sinus rhythm  without significant arrhythmias.  EKG:  EKG is ordered today.  The ekg ordered today demonstrates NSR 73bpm, no St/T wave changes  Recent Labs: 06/05/2021: ALT 24; Hemoglobin 13.0; Platelets 268 07/27/2021: BUN 5; Creatinine, Ser 0.75; Potassium 4.1; Sodium 134  Recent Lipid Panel No results found for: "CHOL", "TRIG", "HDL", "CHOLHDL", "VLDL", "LDLCALC", "LDLDIRECT"  Physical Exam:    VS:  BP 112/86 (BP Location:  Left Arm, Patient Position: Sitting, Cuff Size: Normal)   Pulse 73   Ht 5' 5.5" (1.664 m)   Wt 195 lb 9.6 oz (88.7 kg)   LMP 05/20/2011   SpO2 98%   BMI 32.05 kg/m     Wt Readings from Last 3 Encounters:  03/03/22 195 lb 9.6 oz (88.7 kg)  07/27/21 174 lb (78.9 kg)  06/16/21 171 lb 6.4 oz (77.7 kg)     GEN:  Well nourished, well developed in no acute distress HEENT: Normal NECK: No JVD; No carotid bruits LYMPHATICS: No lymphadenopathy CARDIAC: RRR, no murmurs, rubs, gallops RESPIRATORY:  Clear to auscultation without rales, wheezing or rhonchi  ABDOMEN: Soft, non-tender, non-distended MUSCULOSKELETAL:  No edema; No deformity  SKIN: Warm and dry NEUROLOGIC:  Alert and oriented x 3 PSYCHIATRIC:  Normal affect   ASSESSMENT:    1. Palpitations   2. Atypical chest pain    PLAN:    In order of problems listed above:  Palpitations Patient reports increased palpitations. EKG today shows NSR. Prior work-up was unremarkable. I will increase diltiazem to '120mg'$  BID. She has diltiazem '30mg'$  to take fro break through palpitations.   Atypical chest pain The patient reports atypical chest pain. She had a Cardiac CTA in 2021 that showed coronary calcium score of 0. No further work-up at this time.   Disposition: Follow up in 6 month(s) with MD/APP    Signed, Mihran Lebarron Ninfa Meeker, PA-C  03/03/2022 3:34 PM     [facility]

## 2022-03-03 NOTE — Patient Instructions (Addendum)
Medication Instructions:  INCREASE diltiazem 120 mg to twice a day.  *If you need a refill on your cardiac medications before your next appointment, please call your pharmacy*   Lab Work: No labs ordered  If you have labs (blood work) drawn today and your tests are completely normal, you will receive your results only by: [place] (if you have MyChart) OR A paper copy in the mail If you have any lab test that is abnormal or we need to change your treatment, we will call you to review the results.   Testing/Procedures: No testing ordered  Follow-Up: At [facility], you and your health needs are our priority.  As part of our continuing mission to provide you with exceptional heart care, we have created designated Provider Care Teams.  These Care Teams include your primary Cardiologist (physician) and Advanced Practice Providers (APPs -  Physician Assistants and Nurse Practitioners) who all work together to provide you with the care you need, when you need it.  We recommend signing up for the patient portal called "MyChart".  Sign up information is provided on this After Visit Summary.  MyChart is used to connect with patients for Virtual Visits (Telemedicine).  Patients are able to view lab/test results, encounter notes, upcoming appointments, etc.  Non-urgent messages can be sent to your provider as well.   To learn more about what you can do with MyChart, go to [email or web address].    Your next appointment:   6 month(s)  Provider:   You may see [name], MD or one of the following Advanced Practice Providers on your designated Care Team:   Murray Hodgkins, NP Christell Faith, PA-C Cadence Kathlen Mody, PA-C Gerrie Nordmann, NP

## 2022-03-05 ENCOUNTER — Other Ambulatory Visit: Payer: Self-pay | Admitting: Internal Medicine

## 2022-03-06 DIAGNOSIS — M79629 Pain in unspecified upper arm: Secondary | ICD-10-CM | POA: Diagnosis not present

## 2022-03-06 DIAGNOSIS — R7309 Other abnormal glucose: Secondary | ICD-10-CM | POA: Diagnosis not present

## 2022-03-08 ENCOUNTER — Ambulatory Visit: Payer: 59

## 2022-03-08 ENCOUNTER — Other Ambulatory Visit: Payer: Self-pay | Admitting: Obstetrics and Gynecology

## 2022-03-08 DIAGNOSIS — M79621 Pain in right upper arm: Secondary | ICD-10-CM

## 2022-03-09 ENCOUNTER — Ambulatory Visit: Payer: Medicare Other | Admitting: Internal Medicine

## 2022-03-09 DIAGNOSIS — G5621 Lesion of ulnar nerve, right upper limb: Secondary | ICD-10-CM | POA: Diagnosis not present

## 2022-03-09 DIAGNOSIS — M79644 Pain in right finger(s): Secondary | ICD-10-CM | POA: Diagnosis not present

## 2022-03-09 DIAGNOSIS — S93402A Sprain of unspecified ligament of left ankle, initial encounter: Secondary | ICD-10-CM | POA: Diagnosis not present

## 2022-03-10 DIAGNOSIS — S93432D Sprain of tibiofibular ligament of left ankle, subsequent encounter: Secondary | ICD-10-CM | POA: Diagnosis not present

## 2022-03-10 NOTE — Telephone Encounter (Signed)
error 

## 2022-03-14 ENCOUNTER — Other Ambulatory Visit: Payer: Self-pay | Admitting: Internal Medicine

## 2022-03-15 ENCOUNTER — Other Ambulatory Visit: Payer: Self-pay | Admitting: Internal Medicine

## 2022-03-20 ENCOUNTER — Ambulatory Visit
Admission: RE | Admit: 2022-03-20 | Discharge: 2022-03-20 | Disposition: A | Discharge status: Home / Self Care | Payer: 59 | Source: Ambulatory Visit | Attending: Obstetrics and Gynecology | Admitting: Obstetrics and Gynecology

## 2022-03-20 DIAGNOSIS — M79621 Pain in right upper arm: Secondary | ICD-10-CM

## 2022-03-20 DIAGNOSIS — N644 Mastodynia: Secondary | ICD-10-CM | POA: Diagnosis not present

## 2022-03-21 ENCOUNTER — Encounter: Payer: Self-pay | Admitting: Podiatry

## 2022-03-21 ENCOUNTER — Ambulatory Visit (INDEPENDENT_AMBULATORY_CARE_PROVIDER_SITE_OTHER): Payer: 59 | Admitting: Podiatry

## 2022-03-21 ENCOUNTER — Ambulatory Visit (INDEPENDENT_AMBULATORY_CARE_PROVIDER_SITE_OTHER): Payer: 59

## 2022-03-21 DIAGNOSIS — M7751 Other enthesopathy of right foot: Secondary | ICD-10-CM

## 2022-03-21 DIAGNOSIS — M7752 Other enthesopathy of left foot: Secondary | ICD-10-CM

## 2022-03-21 DIAGNOSIS — M779 Enthesopathy, unspecified: Secondary | ICD-10-CM | POA: Diagnosis not present

## 2022-03-21 MED ORDER — BETAMETHASONE SOD PHOS & ACET 6 (3-3) MG/ML IJ SUSP
3.0000 mg | Freq: Once | INTRAMUSCULAR | Status: AC
  Administered 2022-03-21: 3 mg via INTRA_ARTICULAR

## 2022-03-21 NOTE — Progress Notes (Signed)
Chief Complaint  Patient presents with   Foot Pain    "My ankle hurts."    HPI: 52 y.o. female presenting today as a reestablish new patient for evaluation of left lateral ankle pain.  Patient states the pain has been ongoing since December when she sprained her ankle.  Patient actually has been treated and managed at [facility] but she is unsatisfied with the care that she has received.  She presents for second opinion and further treatment and evaluation  Past Medical History:  Diagnosis Date   Anxiety 12/14/2011   Asthma    no inhaler   Cataract    Diastolic dysfunction    a. 10/2015 Echo: EF nl, mod diast dysfxn. Mild MR/TR; b. 09/2016 Echo: EF 55-60%, no rwma. Nl RV size and fxn; c. 03/2020 Echo: EF 60-65%. No rwma.   Factor 5 Leiden mutation, heterozygous ([place])    Fibromyalgia    Fracture of 5th metatarsal 03/2011   left foot -     Heart palpitations    a. Notes intermittent tachycardia (sinus). Did not tolerate beta blockers 2/2 urinary incontinence; b. 07/2017 Event monitor: Avg HR 77 (49-136). No significant ectopy/pauses. Triggered events corresponded w/ sinus rhythm, sinus arrhythmias, and artifact.   Interstitial cystitis    Non-cardiac chest pain    a. 12/2015 CTA chest: no PE. No significant coronary Ca2+. Mosaic attenuation pattern in both lungs, may reflect small airway dzs; c. 12/2015 Ex MV: Walked 6 mins. No ischemia/scar; d. 12/2019 Cor CTA: Ca2+ = 0. Nl cors. ? PFO w/o evidence of flow (not seen on echo).   Ovarian cyst    Pancreatitis    Pelvic floor dysfunction    Rheumatoid arteritis ([place])    Sinus infection    chronic   Syncope    a. 08/2017 Event monitor: Average heart rate 77 (49-136).  Periods of sinus arrhythmia noted.  Triggered events corresponded with sinus rhythm, sinus arrhythmia, and artifact.  No significant ectopy, sustained arrhythmias, or prolonged pauses observed.   Thrombosis    a. 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    Past Surgical History:  Procedure Laterality Date   ABDOMINAL HYSTERECTOMY     CHOLECYSTECTOMY     CYSTOSCOPY WITH HYDRODISTENSION AND BIOPSY  04/21/2011   Dr Philis Fendt   LAPAROSCOPIC ASSISTED VAGINAL HYSTERECTOMY  08/21/2011   Procedure: LAPAROSCOPIC ASSISTED VAGINAL HYSTERECTOMY;  Surgeon: Cyril Mourning, MD;  Location: [facility];  Service: Gynecology;  Laterality: N/A;  OPEN LAPAROSCOPIC   LAPAROSCOPIC NISSEN FUNDOPLICATION  [id number]   neck ablation Left 12/29/2019   neck ablation Right 01/05/2020   SALPINGOOPHORECTOMY  08/21/2011   Procedure: SALPINGO OOPHERECTOMY;  Surgeon: Cyril Mourning, MD;  Location: [facility];  Service: Gynecology;  Laterality: Bilateral;   TUBAL LIGATION  2010   WISDOM TOOTH EXTRACTION      Allergies  Allergen Reactions   Amitriptyline Other (See Comments)    Other reaction(s): Other  Change in mental status   Amoxicillin Rash   Bromelains     Other reaction(s): Abdominal pain   Celexa [Citalopram Hydrobromide] Other (See Comments)    Other reaction(s): Other Change in mental status   Ivp Dye [Iodinated Contrast Media] Anaphylaxis    Pt reports "I do not have contrast allergy and have received IVP dye without pre-med with NO reaction."    Nortriptyline     Other reaction(s): Other  Penicillins Swelling and Rash   Pregabalin     Other reaction(s): Myalgias (muscle pain)   Bromelains [Pineapple Extract] Other (See Comments)    Severe pain   Budesonide-Formoterol Fumarate     Other reaction(s): Other   Citalopram Other (See Comments)   Mangifera Indica Swelling   Methylene Blue Other (See Comments)    Caused inflamed pancreas, per pt should not use   Sertraline Other (See Comments)    Other reaction(s): Other Suicidal thoughts   Sulfa Antibiotics Other (See Comments)   Sulfonamide Derivatives Other (See Comments)    excrutiating pain, patient states it caused her to have  osteoarthritis   Bromaline Lorretta Harp Bromm] Rash   Other Rash    Mango     Physical Exam: General: The patient is alert and oriented x3 in no acute distress.  Dermatology: Skin is warm, dry and supple bilateral lower extremities. Negative for open lesions or macerations.  Vascular: Palpable pedal pulses bilaterally. Capillary refill within normal limits.  Negative for any significant edema or erythema  Neurological: Light touch and protective threshold grossly intact  Musculoskeletal Exam: No pedal deformities noted.  There is some tenderness with palpation of the lateral aspect of the left ankle joint as well as the first MTP of the right foot  Radiographic Exam LT ankle 03/21/2022:  Normal osseous mineralization. Joint spaces preserved. No fracture/dislocation/boney destruction.    Assessment: 1.  Capsulitis left ankle/ankle sprain December 2023 -Patient evaluated.  X-rays reviewed -Injection of 0.5 cc Celestone Soluspan injected lateral aspect of the left ankle joint -Cam boot dispensed.  WBAT x 3 weeks -Patient cannot tolerate meloxicam or diclofenac due to GI upset.  She is known to tolerate Motrin 800 mg.  Recommend Motrin 800 mg 3 times daily with food -Return to clinic 4 weeks for follow-up  2.  First MTP capsulitis right foot -Injection of 0.5 cc Celestone Soluspan injected in the first MTP right foot -Recommend wide fitting shoes that do not irritate the great toe joint      Edrick Kins, DPM [facility]  Dr. Edrick Kins, DPM    [address], [place] [address]                Office [phone]  Fax [phone]

## 2022-03-29 ENCOUNTER — Telehealth: Payer: Self-pay

## 2022-03-29 ENCOUNTER — Ambulatory Visit: Payer: Medicare Other | Admitting: Nurse Practitioner

## 2022-03-29 ENCOUNTER — Other Ambulatory Visit: Payer: Self-pay

## 2022-03-29 MED ORDER — DILTIAZEM HCL 30 MG PO TABS: ORAL_TABLET | ORAL | 2 refills | Status: DC

## 2022-03-29 NOTE — Telephone Encounter (Signed)
Called [facility] and spoke with Arbie Cookey the pharmacist and gave verbal order for Diltiazem 30 mg tablets with 2 refills.

## 2022-03-30 DIAGNOSIS — R5382 Chronic fatigue, unspecified: Secondary | ICD-10-CM | POA: Diagnosis not present

## 2022-03-30 DIAGNOSIS — M2550 Pain in unspecified joint: Secondary | ICD-10-CM | POA: Diagnosis not present

## 2022-03-30 DIAGNOSIS — M797 Fibromyalgia: Secondary | ICD-10-CM | POA: Diagnosis not present

## 2022-03-30 DIAGNOSIS — M791 Myalgia, unspecified site: Secondary | ICD-10-CM | POA: Diagnosis not present

## 2022-03-31 DIAGNOSIS — M797 Fibromyalgia: Secondary | ICD-10-CM | POA: Diagnosis not present

## 2022-03-31 DIAGNOSIS — M533 Sacrococcygeal disorders, not elsewhere classified: Secondary | ICD-10-CM | POA: Diagnosis not present

## 2022-04-03 DIAGNOSIS — M797 Fibromyalgia: Secondary | ICD-10-CM | POA: Diagnosis not present

## 2022-04-03 DIAGNOSIS — N3010 Interstitial cystitis (chronic) without hematuria: Secondary | ICD-10-CM | POA: Diagnosis not present

## 2022-04-03 DIAGNOSIS — K580 Irritable bowel syndrome with diarrhea: Secondary | ICD-10-CM | POA: Diagnosis not present

## 2022-04-03 DIAGNOSIS — M1990 Unspecified osteoarthritis, unspecified site: Secondary | ICD-10-CM | POA: Diagnosis not present

## 2022-04-03 DIAGNOSIS — B3731 Acute candidiasis of vulva and vagina: Secondary | ICD-10-CM | POA: Diagnosis not present

## 2022-04-03 DIAGNOSIS — N816 Rectocele: Secondary | ICD-10-CM | POA: Diagnosis not present

## 2022-04-03 DIAGNOSIS — N3289 Other specified disorders of bladder: Secondary | ICD-10-CM | POA: Diagnosis not present

## 2022-04-03 DIAGNOSIS — R350 Frequency of micturition: Secondary | ICD-10-CM | POA: Diagnosis not present

## 2022-04-12 ENCOUNTER — Telehealth: Payer: Self-pay

## 2022-04-12 NOTE — Telephone Encounter (Signed)
Patient called stating that she has shingles moving towards her eye and she needs to get the acyclovir cream and the 1g valtrex sent to the pharmacy

## 2022-04-13 ENCOUNTER — Other Ambulatory Visit: Payer: Self-pay

## 2022-04-13 MED ORDER — ACYCLOVIR 5 % EX OINT: TOPICAL_OINTMENT | CUTANEOUS | 2 refills | Status: DC

## 2022-04-13 MED ORDER — VALACYCLOVIR HCL 1 G PO TABS: 1000.0000 mg | ORAL_TABLET | Freq: Two times a day (BID) | ORAL | 1 refills | Status: DC

## 2022-04-13 MED ORDER — ACYCLOVIR 5 % EX OINT: TOPICAL_OINTMENT | CUTANEOUS | 2 refills | Status: AC

## 2022-04-13 NOTE — Telephone Encounter (Signed)
Sent to patient pharmacy

## 2022-04-15 ENCOUNTER — Other Ambulatory Visit: Payer: Self-pay | Admitting: Internal Medicine

## 2022-04-18 ENCOUNTER — Encounter: Payer: Self-pay | Admitting: Podiatry

## 2022-04-18 ENCOUNTER — Ambulatory Visit (INDEPENDENT_AMBULATORY_CARE_PROVIDER_SITE_OTHER): Payer: 59 | Admitting: Podiatry

## 2022-04-18 VITALS — BP 136/92 | HR 88

## 2022-04-18 DIAGNOSIS — M7752 Other enthesopathy of left foot: Secondary | ICD-10-CM

## 2022-04-18 MED ORDER — BETAMETHASONE SOD PHOS & ACET 6 (3-3) MG/ML IJ SUSP
3.0000 mg | Freq: Once | INTRAMUSCULAR | Status: AC
  Administered 2022-04-18: 3 mg via INTRA_ARTICULAR

## 2022-04-18 NOTE — Progress Notes (Signed)
Chief Complaint  Patient presents with   Foot Pain    "It's still sore.  I can't find a good shoe to wear on the other foot.  I'm getting pain in my back from wearing the boot.  That injection really helped my right foot."    HPI: 52 y.o. female presenting today for follow-up evaluation of left lateral ankle and right great toe pain.  Patient states that she no longer has any pain or tenderness associated to the right great toe joint.  There is some modest improvement with the left ankle joint.  She continues to be WBAT in the cam boot but is throwing her hips and back out of alignment she says.  Brief history: Patient states the pain has been ongoing since December when she sprained her ankle.  Patient actually has been treated and managed at [facility] but she is unsatisfied with the care that she has received.    Past Medical History:  Diagnosis Date   Anxiety 12/14/2011   Asthma    no inhaler   Cataract    Diastolic dysfunction    a. 10/2015 Echo: EF nl, mod diast dysfxn. Mild MR/TR; b. 09/2016 Echo: EF 55-60%, no rwma. Nl RV size and fxn; c. 03/2020 Echo: EF 60-65%. No rwma.   Factor 5 Leiden mutation, heterozygous ([place])    Fibromyalgia    Fracture of 5th metatarsal 03/2011   left foot -     Heart palpitations    a. Notes intermittent tachycardia (sinus). Did not tolerate beta blockers 2/2 urinary incontinence; b. 07/2017 Event monitor: Avg HR 77 (49-136). No significant ectopy/pauses. Triggered events corresponded w/ sinus rhythm, sinus arrhythmias, and artifact.   Interstitial cystitis    Non-cardiac chest pain    a. 12/2015 CTA chest: no PE. No significant coronary Ca2+. Mosaic attenuation pattern in both lungs, may reflect small airway dzs; c. 12/2015 Ex MV: Walked 6 mins. No ischemia/scar; d. 12/2019 Cor CTA: Ca2+ = 0. Nl cors. ? PFO w/o evidence of flow (not seen on echo).   Ovarian cyst    Pancreatitis    Pelvic floor dysfunction    Rheumatoid arteritis ([place])    Sinus  infection    chronic   Syncope    a. 08/2017 Event monitor: Average heart rate 77 (49-136).  Periods of sinus arrhythmia noted.  Triggered events corresponded with sinus rhythm, sinus arrhythmia, and artifact.  No significant ectopy, sustained arrhythmias, or prolonged pauses observed.   Thrombosis    a. 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    Past Surgical History:  Procedure Laterality Date   ABDOMINAL HYSTERECTOMY     CHOLECYSTECTOMY     CYSTOSCOPY WITH HYDRODISTENSION AND BIOPSY  04/21/2011   Dr Philis Fendt   LAPAROSCOPIC ASSISTED VAGINAL HYSTERECTOMY  08/21/2011   Procedure: LAPAROSCOPIC ASSISTED VAGINAL HYSTERECTOMY;  Surgeon: Cyril Mourning, MD;  Location: [facility];  Service: Gynecology;  Laterality: N/A;  OPEN LAPAROSCOPIC   LAPAROSCOPIC NISSEN FUNDOPLICATION  [id number]   neck ablation Left 12/29/2019   neck ablation Right 01/05/2020   SALPINGOOPHORECTOMY  08/21/2011   Procedure: SALPINGO OOPHERECTOMY;  Surgeon: Cyril Mourning, MD;  Location: [facility];  Service: Gynecology;  Laterality: Bilateral;   TUBAL LIGATION  2010   WISDOM TOOTH EXTRACTION      Allergies  Allergen Reactions   Amitriptyline Other (See Comments)    Other reaction(s): Other  Change in mental status   Amoxicillin Rash   Bromelains     Other reaction(s): Abdominal pain   Celexa [Citalopram Hydrobromide] Other (See Comments)    Other reaction(s): Other Change in mental status   Ivp Dye [Iodinated Contrast Media] Anaphylaxis    Pt reports "I do not have contrast allergy and have received IVP dye without pre-med with NO reaction."    Nortriptyline     Other reaction(s): Other   Penicillins Swelling and Rash   Pregabalin     Other reaction(s): Myalgias (muscle pain)   Bromelains [Pineapple Extract] Other (See Comments)    Severe pain   Budesonide-Formoterol Fumarate     Other reaction(s): Other   Citalopram Other  (See Comments)   Mangifera Indica Swelling   Methylene Blue Other (See Comments)    Caused inflamed pancreas, per pt should not use   Sertraline Other (See Comments)    Other reaction(s): Other Suicidal thoughts   Sulfa Antibiotics Other (See Comments)   Sulfonamide Derivatives Other (See Comments)    excrutiating pain, patient states it caused her to have osteoarthritis   Bromaline Lorretta Harp Bromm] Rash   Other Rash    Mango     Physical Exam: General: The patient is alert and oriented x3 in no acute distress.  Dermatology: Skin is warm, dry and supple bilateral lower extremities. Negative for open lesions or macerations.  Vascular: Palpable pedal pulses bilaterally. Capillary refill within normal limits.  Negative for any significant edema or erythema  Neurological: Light touch and protective threshold grossly intact  Musculoskeletal Exam: No pedal deformities noted.  There continues to be some tenderness with palpation of the lateral aspect of the left ankle joint.  First MTP of the right foot is asymptomatic with no pain on palpation  Radiographic Exam LT ankle 03/21/2022:  Normal osseous mineralization. Joint spaces preserved. No fracture/dislocation/boney destruction.    Assessment: 1.  Capsulitis left ankle/ankle sprain December 2023  -Patient evaluated.  X-rays reviewed -Injection of 0.5 cc Celestone Soluspan injected lateral aspect of the left ankle joint -Discontinue cam boot.  Tri-Lock ankle brace dispensed.  Recommend wearing with good supportive tennis shoes -Patient cannot tolerate meloxicam or diclofenac due to GI upset.  She is known to tolerate Motrin 800 mg.  Recommend Motrin 800 mg 3 times daily with food  2.  First MTP capsulitis right foot -Resolved -Continue wearing good supportive shoes and sneakers  -Return to clinic 6 weeks follow-up LT ankle      Edrick Kins, DPM [facility]  Dr. Edrick Kins, DPM    [address], [place] [address]                Office [phone]  Fax [phone]

## 2022-04-25 ENCOUNTER — Encounter: Payer: Self-pay | Admitting: Internal Medicine

## 2022-04-25 ENCOUNTER — Other Ambulatory Visit: Payer: Self-pay | Admitting: Internal Medicine

## 2022-04-25 ENCOUNTER — Ambulatory Visit (INDEPENDENT_AMBULATORY_CARE_PROVIDER_SITE_OTHER): Payer: 59 | Admitting: Internal Medicine

## 2022-04-25 VITALS — BP 120/80 | HR 108 | Temp 98.6°F | Ht 65.0 in | Wt 197.0 lb

## 2022-04-25 DIAGNOSIS — D6851 Activated protein C resistance: Secondary | ICD-10-CM

## 2022-04-25 DIAGNOSIS — M797 Fibromyalgia: Secondary | ICD-10-CM | POA: Diagnosis not present

## 2022-04-25 DIAGNOSIS — E669 Obesity, unspecified: Secondary | ICD-10-CM

## 2022-04-25 DIAGNOSIS — R7303 Prediabetes: Secondary | ICD-10-CM | POA: Diagnosis not present

## 2022-04-25 DIAGNOSIS — N3010 Interstitial cystitis (chronic) without hematuria: Secondary | ICD-10-CM | POA: Diagnosis not present

## 2022-04-25 DIAGNOSIS — I8290 Acute embolism and thrombosis of unspecified vein: Secondary | ICD-10-CM | POA: Diagnosis not present

## 2022-04-25 NOTE — Progress Notes (Signed)
Established Patient Office Visit  Subjective:  Patient ID: April [name], female    DOB: 1970/12/02  Age: 52 y.o. MRN: [id number]  Chief Complaint  Patient presents with   Follow-up    3 months lab results    No new complaints, here for  medication refills. Visited multiple specialists over the last month. Last A1c is now in the prediabetic range.     No other concerns at this time.   Past Medical History:  Diagnosis Date   Anxiety 12/14/2011   Asthma    no inhaler   Cataract    Diastolic dysfunction    a. 10/2015 Echo: EF nl, mod diast dysfxn. Mild MR/TR; b. 09/2016 Echo: EF 55-60%, no rwma. Nl RV size and fxn; c. 03/2020 Echo: EF 60-65%. No rwma.   Factor 5 Leiden mutation, heterozygous ([place])    Fibromyalgia    Fracture of 5th metatarsal 03/2011   left foot -     Heart palpitations    a. Notes intermittent tachycardia (sinus). Did not tolerate beta blockers 2/2 urinary incontinence; b. 07/2017 Event monitor: Avg HR 77 (49-136). No significant ectopy/pauses. Triggered events corresponded w/ sinus rhythm, sinus arrhythmias, and artifact.   Interstitial cystitis    Non-cardiac chest pain    a. 12/2015 CTA chest: no PE. No significant coronary Ca2+. Mosaic attenuation pattern in both lungs, may reflect small airway dzs; c. 12/2015 Ex MV: Walked 6 mins. No ischemia/scar; d. 12/2019 Cor CTA: Ca2+ = 0. Nl cors. ? PFO w/o evidence of flow (not seen on echo).   Ovarian cyst    Pancreatitis    Pelvic floor dysfunction    Rheumatoid arteritis ([place])    Sinus infection    chronic   Syncope    a. 08/2017 Event monitor: Average heart rate 77 (49-136).  Periods of sinus arrhythmia noted.  Triggered events corresponded with sinus rhythm, sinus arrhythmia, and artifact.  No significant ectopy, sustained arrhythmias, or prolonged pauses observed.   Thrombosis    a. 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     Past Surgical History:  Procedure Laterality Date   ABDOMINAL HYSTERECTOMY     CHOLECYSTECTOMY     CYSTOSCOPY WITH HYDRODISTENSION AND BIOPSY  04/21/2011   Dr Philis Fendt   LAPAROSCOPIC ASSISTED VAGINAL HYSTERECTOMY  08/21/2011   Procedure: LAPAROSCOPIC ASSISTED VAGINAL HYSTERECTOMY;  Surgeon: Cyril Mourning, MD;  Location: [facility];  Service: Gynecology;  Laterality: N/A;  OPEN LAPAROSCOPIC   LAPAROSCOPIC NISSEN FUNDOPLICATION  [id number]   neck ablation Left 12/29/2019   neck ablation Right 01/05/2020   SALPINGOOPHORECTOMY  08/21/2011   Procedure: SALPINGO OOPHERECTOMY;  Surgeon: Cyril Mourning, MD;  Location: [facility];  Service: Gynecology;  Laterality: Bilateral;   TUBAL LIGATION  2010   WISDOM TOOTH EXTRACTION      Social History   Socioeconomic History   Marital status: Married    Spouse name: Not on file   Number of children: Not on file   Years of education: Not on file   Highest education level: Not on file  Occupational History   Not on file  Tobacco Use   Smoking status: Never   Smokeless tobacco: Never  Vaping Use   Vaping Use: Never used  Substance and Sexual Activity   Alcohol use: No   Drug use: No   Sexual activity: Yes    Birth control/protection: Surgical  Other Topics Concern   Not on file  Social History Narrative   Not on file   Social Determinants of Health   Financial Resource Strain: Not on file  Food Insecurity: Not on file  Transportation Needs: Not on file  Physical Activity: Not on file  Stress: Not on file  Social Connections: Not on file  Intimate Partner Violence: Not on file    Family History  Problem Relation Age of Onset   Heart Problems Mother    Vascular Disease Mother    Skin cancer Mother    Vascular Disease Maternal Grandmother    Kidney Stones Father    Skin cancer Maternal Uncle    Anesthesia problems Neg Hx     Allergies  Allergen Reactions   Amitriptyline Other (See Comments)    Other reaction(Valina Maes):  Other  Change in mental status   Amoxicillin Rash   Bromelains     Other reaction(Linda Grimmer): Abdominal pain   Celexa [Citalopram Hydrobromide] Other (See Comments)    Other reaction(Miken Stecher): Other Change in mental status   Ivp Dye [Iodinated Contrast Media] Anaphylaxis    Pt reports "I do not have contrast allergy and have received IVP dye without pre-med with NO reaction."    Nortriptyline     Other reaction(Donesha Wallander): Other   Penicillins Swelling and Rash   Pregabalin     Other reaction(Mida Cory): Myalgias (muscle pain)   Bromelains [Pineapple Extract] Other (See Comments)    Severe pain   Budesonide-Formoterol Fumarate     Other reaction(Lari Linson): Other   Citalopram Other (See Comments)   Mangifera Indica Swelling   Methylene Blue Other (See Comments)    Caused inflamed pancreas, per pt should not use   Sertraline Other (See Comments)    Other reaction(Halston Kintz): Other Suicidal thoughts   Sulfa Antibiotics Other (See Comments)   Sulfonamide Derivatives Other (See Comments)    excrutiating pain, patient states it caused her to have osteoarthritis   Bromaline Lorretta Harp Bromm] Rash   Other Rash    Mango    Review of Systems  Constitutional: Negative.   HENT: Negative.    Eyes: Negative.   Respiratory: Negative.    Cardiovascular: Negative.   Gastrointestinal: Negative.   Genitourinary: Negative.   Skin: Negative.   Neurological: Negative.   Endo/Heme/Allergies: Negative.        Objective:   BP 120/80   Pulse (!) 108   Temp 98.6 F (37 C) (Tympanic)   Ht 5\' 5"  (1.651 m)   Wt 197 lb (89.4 kg)   LMP 05/20/2011   SpO2 99%   BMI 32.78 kg/m   Vitals:   04/25/22 1421  BP: 120/80  Pulse: (!) 108  Temp: 98.6 F (37 C)  Height: 5\' 5"  (1.651 m)  Weight: 197 lb (89.4 kg)  SpO2: 99%  TempSrc: Tympanic  BMI (Calculated): 32.78    Physical Exam Vitals reviewed.  Constitutional:      General: She is not in acute distress. HENT:     Head: Normocephalic.     Nose: Nose normal.      Mouth/Throat:     Mouth: Mucous membranes are moist.  Eyes:     Extraocular Movements: Extraocular movements intact.     Pupils: Pupils are equal, round, and reactive to light.  Cardiovascular:     Rate and Rhythm: Normal rate and regular rhythm.     Heart sounds: No murmur heard. Pulmonary:     Effort: Pulmonary effort is normal.  Breath sounds: No rhonchi or rales.  Abdominal:     General: Bowel sounds are normal.     Palpations: Abdomen is soft. There is no hepatomegaly, splenomegaly or mass.     Comments: Stimulator in right gluteus  Musculoskeletal:        General: Normal range of motion.     Cervical back: Normal range of motion. No tenderness.     Comments: Left ankle boot in place  Skin:    General: Skin is warm and dry.  Neurological:     General: No focal deficit present.     Mental Status: She is alert and oriented to person, place, and time.     Cranial Nerves: No cranial nerve deficit.     Motor: No weakness.  Psychiatric:        Mood and Affect: Mood normal.        Behavior: Behavior normal.      No results found for any visits on 04/25/22.  No results found for this or any previous visit (from the past 2160 hour(Kenedy Haisley)).    Assessment & Plan:   Problem List Items Addressed This Visit   None   No follow-ups on file.   Total time spent: 30 minutes  Volanda Napoleon, MD  04/25/2022

## 2022-05-03 ENCOUNTER — Other Ambulatory Visit: Payer: Self-pay | Admitting: Internal Medicine

## 2022-05-04 ENCOUNTER — Telehealth: Payer: Self-pay

## 2022-05-04 NOTE — Telephone Encounter (Signed)
Go ahead and see if we can get her in a little bit sooner.  Thank so much, Dr. Amalia Hailey

## 2022-05-04 NOTE — Telephone Encounter (Signed)
Noted, thanks!

## 2022-05-09 ENCOUNTER — Ambulatory Visit (INDEPENDENT_AMBULATORY_CARE_PROVIDER_SITE_OTHER): Payer: 59 | Admitting: Podiatry

## 2022-05-09 DIAGNOSIS — M7671 Peroneal tendinitis, right leg: Secondary | ICD-10-CM

## 2022-05-09 MED ORDER — BETAMETHASONE SOD PHOS & ACET 6 (3-3) MG/ML IJ SUSP
3.0000 mg | Freq: Once | INTRAMUSCULAR | Status: AC
  Administered 2022-05-09: 3 mg via INTRA_ARTICULAR

## 2022-05-09 MED ORDER — METHYLPREDNISOLONE 4 MG PO TBPK: ORAL_TABLET | ORAL | 0 refills | Status: DC

## 2022-05-09 NOTE — Progress Notes (Signed)
No chief complaint on file.   HPI: 52 y.o. female presenting today for new complaint of pain and tenderness associated to the lateral aspect of the right ankle.  Gradual onset.  Patient believes it is from compensating to her left lower extremity.  She says her left lower extremity is feeling very well at the moment.  Past Medical History:  Diagnosis Date   Anxiety 12/14/2011   Asthma    no inhaler   Cataract    Diastolic dysfunction    a. 10/2015 Echo: EF nl, mod diast dysfxn. Mild MR/TR; b. 09/2016 Echo: EF 55-60%, no rwma. Nl RV size and fxn; c. 03/2020 Echo: EF 60-65%. No rwma.   Factor 5 Leiden mutation, heterozygous (HCC)    Fibromyalgia    Fracture of 5th metatarsal 03/2011   left foot -     Heart palpitations    a. Notes intermittent tachycardia (sinus). Did not tolerate beta blockers 2/2 urinary incontinence; b. 07/2017 Event monitor: Avg HR 77 (49-136). No significant ectopy/pauses. Triggered events corresponded w/ sinus rhythm, sinus arrhythmias, and artifact.   Interstitial cystitis    Non-cardiac chest pain    a. 12/2015 CTA chest: no PE. No significant coronary Ca2+. Mosaic attenuation pattern in both lungs, may reflect small airway dzs; c. 12/2015 Ex MV: Walked 6 mins. No ischemia/scar; d. 12/2019 Cor CTA: Ca2+ = 0. Nl cors. ? PFO w/o evidence of flow (not seen on echo).   Ovarian cyst    Pancreatitis    Pelvic floor dysfunction    Rheumatoid arteritis (HCC)    Sinus infection    chronic   Syncope    a. 08/2017 Event monitor: Average heart rate 77 (49-136).  Periods of sinus arrhythmia noted.  Triggered events corresponded with sinus rhythm, sinus arrhythmia, and artifact.  No significant ectopy, sustained arrhythmias, or prolonged pauses observed.   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    Past Surgical History:  Procedure Laterality Date   ABDOMINAL  HYSTERECTOMY     CHOLECYSTECTOMY     CYSTOSCOPY WITH HYDRODISTENSION AND BIOPSY  04/21/2011   Dr Lorenz Coaster   LAPAROSCOPIC ASSISTED VAGINAL HYSTERECTOMY  08/21/2011   Procedure: LAPAROSCOPIC ASSISTED VAGINAL HYSTERECTOMY;  Surgeon: Jeani Hawking, MD;  Location: [facility];  Service: Gynecology;  Laterality: N/A;  OPEN LAPAROSCOPIC   LAPAROSCOPIC NISSEN FUNDOPLICATION  2007   neck ablation Left 12/29/2019   neck ablation Right 01/05/2020   SALPINGOOPHORECTOMY  08/21/2011   Procedure: SALPINGO OOPHERECTOMY;  Surgeon: Jeani Hawking, MD;  Location: [facility];  Service: Gynecology;  Laterality: Bilateral;   TUBAL LIGATION  2010   WISDOM TOOTH EXTRACTION      Allergies  Allergen Reactions   Amitriptyline Other (See Comments)    Other reaction(s): Other  Change in mental status   Amoxicillin Rash   Bromelains     Other reaction(s): Abdominal pain   Celexa [Citalopram Hydrobromide] Other (See Comments)    Other reaction(s): Other Change in mental status   Ivp Dye [Iodinated Contrast Media] Anaphylaxis    Pt reports "I do not have contrast allergy and have received IVP dye without pre-med with NO reaction."    Nortriptyline     Other reaction(s): Other   Penicillins Swelling and Rash   Pregabalin     Other reaction(s): Myalgias (muscle pain)   Bromelains [Pineapple Extract] Other (See Comments)  Severe pain   Budesonide-Formoterol Fumarate     Other reaction(s): Other   Citalopram Other (See Comments)   Mangifera Indica Swelling   Methylene Blue Other (See Comments)    Caused inflamed pancreas, per pt should not use   Sertraline Other (See Comments)    Other reaction(s): Other Suicidal thoughts   Sulfa Antibiotics Other (See Comments)   Sulfonamide Derivatives Other (See Comments)    excrutiating pain, patient states it caused her to have osteoarthritis   Bromaline Golden Pop Bromm] Rash   Other Rash    Mango     Physical Exam: General: The patient is alert and  oriented x3 in no acute distress.  Dermatology: Skin is warm, dry and supple bilateral lower extremities. Negative for open lesions or macerations.  Vascular: Palpable pedal pulses bilaterally. Capillary refill within normal limits.  Negative for any significant edema or erythema  Neurological: Light touch and protective threshold grossly intact  Musculoskeletal Exam: No pedal deformities noted.  Tenderness to palpation along the peroneal tendons extending up into the muscle belly RLE.  The majority of the patient's tenderness and pain is isolated just posterior to the lateral malleolus  Radiographic Exam LT ankle 03/21/2022:  Normal osseous mineralization. Joint spaces preserved. No fracture/dislocation/boney destruction.    Assessment: 1.  Capsulitis left ankle/ankle sprain December 2023 -Currently asymptomatic.  Doing well  2.  First MTP capsulitis right foot -Resolved  3.  Peroneal tendinitis RLE -Patient symptoms are likely coming from overcompensation.  She denies a history of injury or any acute incident eliciting the symptoms.  No x-rays taken/warranted -Injection 0.5 cc Celestone Soluspan injected along the peroneal tendon sheath just posterior to the lateral malleolus -Prescription for Medrol Dosepak, then resume Motrin 800 mg TID PRN w/ food -Patient has a immobilization cam boot at home.  Recommend WBAT x 3 weeks.  After that she may transition out of the cam boot into good supportive sneakers and shoes -Return to clinic 4 weeks     Felecia Shelling, DPM [facility]  Dr. Felecia Shelling, DPM    [address] [place], [place] [address]                Office [phone]  Fax [phone]

## 2022-05-10 ENCOUNTER — Other Ambulatory Visit: Payer: Self-pay | Admitting: Internal Medicine

## 2022-05-12 ENCOUNTER — Other Ambulatory Visit: Payer: Self-pay | Admitting: Internal Medicine

## 2022-05-15 ENCOUNTER — Other Ambulatory Visit: Payer: Self-pay | Admitting: Internal Medicine

## 2022-05-16 ENCOUNTER — Ambulatory Visit: Payer: 59 | Admitting: Podiatry

## 2022-05-16 DIAGNOSIS — M797 Fibromyalgia: Secondary | ICD-10-CM | POA: Diagnosis not present

## 2022-05-19 ENCOUNTER — Other Ambulatory Visit: Payer: Self-pay | Admitting: Internal Medicine

## 2022-05-29 ENCOUNTER — Emergency Department (HOSPITAL_COMMUNITY): Payer: 59

## 2022-05-29 ENCOUNTER — Encounter (HOSPITAL_COMMUNITY): Payer: Self-pay | Admitting: Emergency Medicine

## 2022-05-29 ENCOUNTER — Emergency Department (HOSPITAL_COMMUNITY)
Admission: EM | Admit: 2022-05-29 | Discharge: 2022-05-29 | Disposition: A | Discharge status: Home / Self Care | Payer: 59 | Attending: Emergency Medicine | Admitting: Emergency Medicine

## 2022-05-29 ENCOUNTER — Other Ambulatory Visit: Payer: Self-pay

## 2022-05-29 DIAGNOSIS — R0789 Other chest pain: Secondary | ICD-10-CM | POA: Diagnosis not present

## 2022-05-29 DIAGNOSIS — R079 Chest pain, unspecified: Secondary | ICD-10-CM | POA: Diagnosis not present

## 2022-05-29 LAB — COMPREHENSIVE METABOLIC PANEL
ALT: 39 U/L (ref 0–44)
AST: 30 U/L (ref 15–41)
Albumin: 4.1 g/dL (ref 3.5–5.0)
Alkaline Phosphatase: 40 U/L (ref 38–126)
Anion gap: 11 (ref 5–15)
BUN: 6 mg/dL (ref 6–20)
CO2: 24 mmol/L (ref 22–32)
Calcium: 9.6 mg/dL (ref 8.9–10.3)
Chloride: 98 mmol/L (ref 98–111)
Creatinine, Ser: 0.74 mg/dL (ref 0.44–1.00)
GFR, Estimated: >60 mL/min (ref >60)
Glucose, Bld: 98 mg/dL (ref 70–99)
Potassium: 4.1 mmol/L (ref 3.5–5.1)
Sodium: 133 mmol/L — ABNORMAL LOW (ref 135–145)
Total Bilirubin: 0.4 mg/dL (ref 0.3–1.2)
Total Protein: 6.9 g/dL (ref 6.5–8.1)

## 2022-05-29 LAB — CBC
HCT: 36.5 % (ref 36.0–46.0)
Hemoglobin: 12.8 g/dL (ref 12.0–15.0)
MCH: 33.5 pg (ref 26.0–34.0)
MCHC: 35.1 g/dL (ref 30.0–36.0)
MCV: 95.5 fL (ref 80.0–100.0)
Platelets: 251 10*3/uL (ref 150–400)
RBC: 3.82 MIL/uL — ABNORMAL LOW (ref 3.87–5.11)
RDW: 15.5 % (ref 11.5–15.5)
WBC: 6.2 10*3/uL (ref 4.0–10.5)
nRBC: 0 % (ref 0.0–0.2)

## 2022-05-29 LAB — TROPONIN I (HIGH SENSITIVITY)
Troponin I (High Sensitivity): 4 ng/L (ref <18)
Troponin I (High Sensitivity): 3 ng/L (ref <18)

## 2022-05-29 LAB — D-DIMER, QUANTITATIVE: D-Dimer, Quant: 0.4 ug/mL-FEU (ref 0.00–0.50)

## 2022-05-29 MED ORDER — KETOROLAC TROMETHAMINE 15 MG/ML IJ SOLN
15.0000 mg | Freq: Once | INTRAMUSCULAR | Status: AC
  Administered 2022-05-29: 15 mg via INTRAVENOUS
  Filled 2022-05-29: qty 1

## 2022-05-29 MED ORDER — METHOCARBAMOL 500 MG PO TABS
750.0000 mg | ORAL_TABLET | Freq: Once | ORAL | Status: AC
  Administered 2022-05-29: 750 mg via ORAL
  Filled 2022-05-29: qty 2

## 2022-05-29 MED ORDER — METHOCARBAMOL 500 MG PO TABS: 500.0000 mg | ORAL_TABLET | Freq: Two times a day (BID) | ORAL | 0 refills | Status: DC

## 2022-05-29 MED ORDER — DILTIAZEM HCL 30 MG PO TABS
30.0000 mg | ORAL_TABLET | Freq: Once | ORAL | Status: DC
  Filled 2022-05-29 (×2): qty 1

## 2022-05-29 MED ORDER — ACETAMINOPHEN 500 MG PO TABS
1000.0000 mg | ORAL_TABLET | Freq: Once | ORAL | Status: AC
  Administered 2022-05-29: 1000 mg via ORAL
  Filled 2022-05-29: qty 2

## 2022-05-29 NOTE — ED Notes (Signed)
Patient transported to x-ray. ?

## 2022-05-29 NOTE — Discharge Instructions (Signed)
April [name]  Thank you for allowing [place] to take care of you today.  You came to the Emergency Department today because you had pain in your chest.  Fortunately your heart number (troponin) was negative twice so this pain is not coming from your heart, you also have no elevation of your blood clot risk factor number, so we do not think that this is a blood clot, additionally you do not have any evidence of a collapsed lung, bones that are out of place, or pneumonia on your chest x-ray.  Pain of your muscles and soreness of your costal cartilage can be very painful, particularly in the setting of fibromyalgia, this is the most likely etiology of your pain since it started with exertion and it worsens when we press on it.  We recommend Tylenol and ibuprofen, we will also give you prescription for a muscle relaxer.[name]  To-Do: 1. Please follow-up with your primary doctor within 2 days / as soon as possible.   Please return to the Emergency Department or call 911 if you experience have worsening of your symptoms, or do not get better, chest pain, shortness of breath, severe or significantly worsening pain, high fever, severe confusion, pass out or have any reason to think that you need emergency medical care.   We hope you feel better soon.   Curley Spice, MD Department of Emergency Medicine [facility]

## 2022-05-29 NOTE — ED Provider Notes (Signed)
[place] EMERGENCY DEPARTMENT AT [facility] Provider Note  Medical Decision Making   HPI: April [name] is a 52 y.o. female with history perinent for factor V Leiden, prior DVT, not on anticoagulation, fibromyalgia, chronic pain syndrome, arthritis, prediabetes, obesity who presents complaining of chest pain. Patient arrived via POV.  History provided by patient.  No interpreter required for this encounter.  Patient reports that pain initially began on 4/26.  Reports that she was cleaning out the minivan, hit her right chest wall on the armrest, initially had some mild right-sided chest wall pain, she was crawling to the back of the car to clear out the remainder of the car when she twisted and noted severe sharp pain in the same region of her right-sided chest wall.  Reports that the pain has been constant since then, has had waxing and waning intensity, worsened by movement, palpation, activities such as taking off her close, does note some pleuritic pain.  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  Denies any hemoptysis.  ROS: As per HPI. Please see MAR for complete past medical history, surgical history, and social history.   Physical exam is pertinent for reproducible chest wall pain on palpation at the right sternum and right costal margin.   The differential includes but is not limited to ACS, arrhythmia, pericardial tamponade, pericarditis, myocarditis, pneumonia, pneumothorax, esophageal, tear, perforated abdominal viscous, pulmonary embolism, aortic dissection, costochondritis, musculoskeletal chest wall pain, GERD.   Additional history obtained from: none External records from outside source obtained and reviewed including: none  ED provider interpretation of ECG: Rate 100, sinus rhythm, no ST elevations or depressions, nonspecific T wave inversion in  V1, T wave flattening in leads V3 through V5, lead III  ED provider interpretation of radiology/imaging: Chest x-ray without focal airspace opacification, cardiomediastinal silhouette derangement, pleural effusion, pneumothorax, bony displacement, there is a large gastric bubble   Labs ordered were interpreted by myself as well as my attending and were incorporated into the medical decision making process for this patient.  ED provider interpretation of labs: Initial troponin 4, delta troponin 3.  D-dimer WNL.  CBC without leukocytosis anemia, thrombocytopenia.  CMP without AKI or emergent electrolyte derangement.  Interventions: Toradol, Robaxin, Tylenol  See the EMR for full details regarding lab and imaging results.  [name] is awake, alert, and HDS.  Her exam is most notable for chest wall tenderness to palpation.  The ECG reveals no anatomical ischemia representing STEMI, New-Onset Arrhythmia, or ischemic equivalent.  She has been risk stratified with a HEAR score of 3. Initial troponin is 4; delta troponin is 3.  The patient's presentation, the patient being hemodynamically stable, and the ECG are not consistent with Pericardial Tamponade. The patient's pain is not positional. This in conjunction with the lack of PR depressions and ST elevations on the ECG are reassuring against Pericarditis. The patient's non-elevated troponin and ECG are also inconsistent with Myocarditis.  The CXR is unremarkable for focal airspace disease.  The patient is afebrile and denies productive cough.  Therefore, I do not suspect Pneumonia. There is no evidence of Pneumothorax on physical exam or on the CXR. CXR shows no evidence of Esophageal Tear and there is no recent intractable emesis or esophageal instrumentation. There is no peritonitis or free air on CXR worrisome for a Perforated Abdominal Viscous.  Pulmonary Embolism is on the  differential. The patient is at  risk via the Revised Geneva Criteria.  Therefore, we will further risk stratify the patient with a d-dimer.  This was WNL. Therefore, a CTA was not indicated.  The patient's pain is not tearing and it does not radiate to back. Pulses are present bilaterally in both the upper and lower extremities. CXR does not show a widened mediastinum. I have a very low suspicion for Aortic Dissection.   Overall, aforementioned workup is reassuring against emergent etiology of symptoms, patient's symptoms are most consistent with musculoskeletal given that they initially occurred with exertion, or worse with movement, palpation.  Patient was administered Robaxin, Toradol, acetaminophen in the ED. Patient requested dose of diltiazem while in the ED, however ultimately elected against taking it prior to discharge.     Consults: Not indicated  Disposition: DISCHARGE: I believe that the patient is safe for discharge home with outpatient follow-up. Patient was informed of all pertinent physical exam, laboratory, and imaging findings. Patient's suspected etiology of their symptom presentation was discussed with the patient and all questions were answered. We discussed following up with PCP. I provided thorough ED return precautions. The patient feels safe and comfortable with this plan.  The plan for this patient was discussed with Dr. Hyacinth Meeker, who voiced agreement and who oversaw evaluation and treatment of this patient.  Clinical Impression:  1. Chest pain, unspecified type    Discharge  Therapies: These medications and interventions were provided for the patient while in the ED. Medications  diltiazem (CARDIZEM) tablet 30 mg (30 mg Oral Patient Refused/Not Given 05/29/22 2233)  acetaminophen (TYLENOL) tablet 1,000 mg (1,000 mg Oral Given 05/29/22 1943)  methocarbamol (ROBAXIN) tablet 750 mg (750 mg Oral Given 05/29/22 1950)  ketorolac (TORADOL) 15 MG/ML injection 15 mg (15 mg Intravenous Given 05/29/22 2218)    MDM generated using voice dictation  software and may contain dictation errors.  Please contact me for any clarification or with any questions.  Clinical Complexity A medically appropriate history, review of systems, and physical exam was performed.  Collateral history obtained from: None I personally reviewed the labs, EKG, imaging as discussed above. Patient's presentation is most consistent with acute complicated illness / injury requiring diagnostic workup Considered and ruled out life and body threatening conditions  Treatment: Outpatient follow-up Medications: Prescription Discussed patient's care with providers from the following different specialties: None  Physical Exam   ED Triage Vitals  Enc Vitals Group     BP 05/29/22 1657 (!) 166/108     Pulse Rate 05/29/22 1657 88     Resp 05/29/22 1657 18     Temp 05/29/22 1657 98.1 F (36.7 C)     Temp Source 05/29/22 1657 Oral     SpO2 05/29/22 1657 100 %     Weight 05/29/22 1658 194 lb 0.1 oz (88 kg)     Height 05/29/22 1658 5\' 5"  (1.651 m)     Head Circumference --      Peak Flow --      Pain Score 05/29/22 1657 10     Pain Loc --      Pain Edu? --      Excl. in GC? --      Physical Exam Vitals and nursing note reviewed.  Constitutional:      General: She is not in acute distress.    Appearance: She is well-developed.  HENT:     Head: Normocephalic and atraumatic.  Eyes:     Conjunctiva/sclera: Conjunctivae normal.  Cardiovascular:     Rate and Rhythm: Normal rate and regular rhythm.     Pulses:          Radial pulses are 2+ on the right side and 2+ on the left side.     Heart sounds: No murmur heard. Pulmonary:     Effort: Pulmonary effort is normal. No respiratory distress.     Breath sounds: Normal breath sounds.  Chest:    Abdominal:     Palpations: Abdomen is soft.     Tenderness: There is no abdominal tenderness.  Musculoskeletal:        General: No swelling.     Cervical back: Neck supple.  Skin:    General: Skin is warm and dry.      Capillary Refill: Capillary refill takes less than 2 seconds.  Neurological:     Mental Status: She is alert.  Psychiatric:        Mood and Affect: Mood normal.       Procedure Note  Procedures  DG Chest 2 View  Final Result      Julianne Rice, MD Emergency Medicine, PGY-2   Curley Spice, MD 05/29/22 4010    Eber Hong, MD 05/30/22 [phone]

## 2022-05-29 NOTE — ED Triage Notes (Signed)
Pt endorses central CP since Friday. Pain has been constant. Also reports pain when breathing deep or trying to use inhaler. Pt reports the pain started while she was in the car and twisted against the armrest on her right side.

## 2022-05-30 ENCOUNTER — Ambulatory Visit: Payer: 59 | Admitting: Podiatry

## 2022-05-30 DIAGNOSIS — M5414 Radiculopathy, thoracic region: Secondary | ICD-10-CM | POA: Diagnosis not present

## 2022-05-30 DIAGNOSIS — M9902 Segmental and somatic dysfunction of thoracic region: Secondary | ICD-10-CM | POA: Diagnosis not present

## 2022-05-31 ENCOUNTER — Telehealth: Payer: Self-pay | Admitting: Internal Medicine

## 2022-05-31 NOTE — Telephone Encounter (Signed)
Patient left VM stating that she went to the ER for chest pain and is worse now than she was before she went. They told her to follow up with [place]. She wants a call back.  [phone]

## 2022-06-01 DIAGNOSIS — M9902 Segmental and somatic dysfunction of thoracic region: Secondary | ICD-10-CM | POA: Diagnosis not present

## 2022-06-01 DIAGNOSIS — M5414 Radiculopathy, thoracic region: Secondary | ICD-10-CM | POA: Diagnosis not present

## 2022-06-05 DIAGNOSIS — M5414 Radiculopathy, thoracic region: Secondary | ICD-10-CM | POA: Diagnosis not present

## 2022-06-05 DIAGNOSIS — M9902 Segmental and somatic dysfunction of thoracic region: Secondary | ICD-10-CM | POA: Diagnosis not present

## 2022-06-06 ENCOUNTER — Telehealth: Payer: Self-pay

## 2022-06-06 NOTE — Telephone Encounter (Signed)
Transition Care Management Follow-up Telephone Call Date of discharge and from where: [facility] 4/29 How have you been since you were released from the hospital? Not good left in worse shape then going in to the ED Any questions or concerns? Yes  Items Reviewed: Did the pt receive and understand the discharge instructions provided? Yes  Medications obtained and verified? No  Other? No  Any new allergies since your discharge? No  Dietary orders reviewed? No Do you have support at home? Yes     Follow up appointments reviewed:  PCP Hospital f/u appt confirmed? Yes  Scheduled to see PCP on June  @ . Specialist Hospital f/u appt confirmed? Yes  Scheduled to see Chiropractor on 4/30 @ . Are transportation arrangements needed? No  If their condition worsens, is the pt aware to call PCP or go to the Emergency Dept.? Yes Was the patient provided with contact information for the PCP's office or ED? Yes Was to pt encouraged to call back with questions or concerns? Yes

## 2022-06-08 DIAGNOSIS — M5414 Radiculopathy, thoracic region: Secondary | ICD-10-CM | POA: Diagnosis not present

## 2022-06-08 DIAGNOSIS — Z7289 Other problems related to lifestyle: Secondary | ICD-10-CM | POA: Diagnosis not present

## 2022-06-08 DIAGNOSIS — R300 Dysuria: Secondary | ICD-10-CM | POA: Diagnosis not present

## 2022-06-08 DIAGNOSIS — M9902 Segmental and somatic dysfunction of thoracic region: Secondary | ICD-10-CM | POA: Diagnosis not present

## 2022-06-09 DIAGNOSIS — M797 Fibromyalgia: Secondary | ICD-10-CM | POA: Diagnosis not present

## 2022-06-09 DIAGNOSIS — Z79899 Other long term (current) drug therapy: Secondary | ICD-10-CM | POA: Diagnosis not present

## 2022-06-09 DIAGNOSIS — Z5181 Encounter for therapeutic drug level monitoring: Secondary | ICD-10-CM | POA: Diagnosis not present

## 2022-06-09 DIAGNOSIS — N3010 Interstitial cystitis (chronic) without hematuria: Secondary | ICD-10-CM | POA: Diagnosis not present

## 2022-06-09 DIAGNOSIS — G894 Chronic pain syndrome: Secondary | ICD-10-CM | POA: Diagnosis not present

## 2022-06-12 DIAGNOSIS — M9902 Segmental and somatic dysfunction of thoracic region: Secondary | ICD-10-CM | POA: Diagnosis not present

## 2022-06-12 DIAGNOSIS — M5414 Radiculopathy, thoracic region: Secondary | ICD-10-CM | POA: Diagnosis not present

## 2022-06-15 DIAGNOSIS — M5414 Radiculopathy, thoracic region: Secondary | ICD-10-CM | POA: Diagnosis not present

## 2022-06-15 DIAGNOSIS — M9902 Segmental and somatic dysfunction of thoracic region: Secondary | ICD-10-CM | POA: Diagnosis not present

## 2022-06-16 ENCOUNTER — Ambulatory Visit: Payer: 59 | Admitting: Podiatry

## 2022-06-19 DIAGNOSIS — M9902 Segmental and somatic dysfunction of thoracic region: Secondary | ICD-10-CM | POA: Diagnosis not present

## 2022-06-19 DIAGNOSIS — M5414 Radiculopathy, thoracic region: Secondary | ICD-10-CM | POA: Diagnosis not present

## 2022-07-03 ENCOUNTER — Other Ambulatory Visit: Payer: Self-pay | Admitting: Nurse Practitioner

## 2022-07-04 DIAGNOSIS — M5414 Radiculopathy, thoracic region: Secondary | ICD-10-CM | POA: Diagnosis not present

## 2022-07-04 DIAGNOSIS — M9902 Segmental and somatic dysfunction of thoracic region: Secondary | ICD-10-CM | POA: Diagnosis not present

## 2022-07-07 DIAGNOSIS — R300 Dysuria: Secondary | ICD-10-CM | POA: Diagnosis not present

## 2022-07-11 ENCOUNTER — Ambulatory Visit (INDEPENDENT_AMBULATORY_CARE_PROVIDER_SITE_OTHER): Payer: 59 | Admitting: Podiatry

## 2022-07-11 DIAGNOSIS — S91331A Puncture wound without foreign body, right foot, initial encounter: Secondary | ICD-10-CM | POA: Diagnosis not present

## 2022-07-11 MED ORDER — DOXYCYCLINE HYCLATE 100 MG PO TABS
100.0000 mg | ORAL_TABLET | Freq: Two times a day (BID) | ORAL | 0 refills | Status: DC
Start: 2022-07-11 — End: 2022-09-22

## 2022-07-11 NOTE — Progress Notes (Signed)
Chief Complaint  Patient presents with   Foot Injury    Right foot injury, patient cut the arch of the foot Jun 21, 2022, patient is having some swelling,     HPI: 52 y.o. female presenting today for new complaint of pain and tenderness associated to the plantar arch of the right foot.  Patient went to the beach on 06/27/2022 and stepped on a sea shell.  She had tenderness ever since.  She has been applying some Neosporin and a Band-Aid but she continues to have some slight tenderness to the area.  Past Medical History:  Diagnosis Date   Anxiety 12/14/2011   Asthma    no inhaler   Cataract    Diastolic dysfunction    a. 10/2015 Echo: EF nl, mod diast dysfxn. Mild MR/TR; b. 09/2016 Echo: EF 55-60%, no rwma. Nl RV size and fxn; c. 03/2020 Echo: EF 60-65%. No rwma.   Factor 5 Leiden mutation, heterozygous (HCC)    Fibromyalgia    Fracture of 5th metatarsal 03/2011   left foot -     Heart palpitations    a. Notes intermittent tachycardia (sinus). Did not tolerate beta blockers 2/2 urinary incontinence; b. 07/2017 Event monitor: Avg HR 77 (49-136). No significant ectopy/pauses. Triggered events corresponded w/ sinus rhythm, sinus arrhythmias, and artifact.   Interstitial cystitis    Non-cardiac chest pain    a. 12/2015 CTA chest: no PE. No significant coronary Ca2+. Mosaic attenuation pattern in both lungs, may reflect small airway dzs; c. 12/2015 Ex MV: Walked 6 mins. No ischemia/scar; d. 12/2019 Cor CTA: Ca2+ = 0. Nl cors. ? PFO w/o evidence of flow (not seen on echo).   Ovarian cyst    Pancreatitis    Pelvic floor dysfunction    Rheumatoid arteritis (HCC)    Sinus infection    chronic   Syncope    a. 08/2017 Event monitor: Average heart rate 77 (49-136).  Periods of sinus arrhythmia noted.  Triggered events corresponded with sinus rhythm, sinus arrhythmia, and artifact.  No significant ectopy, sustained arrhythmias, or prolonged pauses observed.   Thrombosis    a. 12/14/2011 Thrombosis  of R gonadal vein with extension of thrombus into IVC to level of R renal vein per CT 12/11/12 of [facility].    Past Surgical History:  Procedure Laterality Date   ABDOMINAL HYSTERECTOMY     CHOLECYSTECTOMY     CYSTOSCOPY WITH HYDRODISTENSION AND BIOPSY  04/21/2011   Dr Lorenz Coaster   LAPAROSCOPIC ASSISTED VAGINAL HYSTERECTOMY  08/21/2011   Procedure: LAPAROSCOPIC ASSISTED VAGINAL HYSTERECTOMY;  Surgeon: Jeani Hawking, MD;  Location: [facility];  Service: Gynecology;  Laterality: N/A;  OPEN LAPAROSCOPIC   LAPAROSCOPIC NISSEN FUNDOPLICATION  2007   neck ablation Left 12/29/2019   neck ablation Right 01/05/2020   SALPINGOOPHORECTOMY  08/21/2011   Procedure: SALPINGO OOPHERECTOMY;  Surgeon: Jeani Hawking, MD;  Location: [facility];  Service: Gynecology;  Laterality: Bilateral;   TUBAL LIGATION  2010   WISDOM TOOTH EXTRACTION      Allergies  Allergen Reactions   Amitriptyline Other (See Comments)    Other reaction(s): Other  Change in mental status   Amoxicillin Rash   Bromelains     Other reaction(s): Abdominal pain   Celexa [Citalopram Hydrobromide] Other (See Comments)    Other reaction(s): Other Change in mental status   Ivp Dye [Iodinated Contrast Media] Anaphylaxis    Pt reports "I do not have contrast allergy and have received IVP dye without pre-med  with NO reaction."    Nortriptyline     Other reaction(s): Other   Penicillins Swelling and Rash   Pregabalin     Other reaction(s): Myalgias (muscle pain)   Bromelains [Pineapple Extract] Other (See Comments)    Severe pain   Budesonide-Formoterol Fumarate     Other reaction(s): Other   Citalopram Other (See Comments)   Mangifera Indica Swelling   Methylene Blue Other (See Comments)    Caused inflamed pancreas, per pt should not use   Sertraline Other (See Comments)    Other reaction(s): Other Suicidal thoughts   Sulfa Antibiotics Other (See Comments)   Sulfonamide Derivatives Other (See Comments)    excrutiating pain,  patient states it caused her to have osteoarthritis   Bromaline Golden Pop Bromm] Rash   Other Rash    Mango     Physical Exam: General: The patient is alert and oriented x3 in no acute distress.  Dermatology: Skin is warm, dry and supple bilateral lower extremities.  Superficial puncture wound noted to the plantar arch of the right foot which appears very superficial and stable.  There are some very mild erythema around the lesion.  Scant serous drainage.  No malodor.  No concern for deeper cellulitis or deep tissue infection  Vascular: Palpable pedal pulses bilaterally. Capillary refill within normal limits.  No appreciable edema.  No erythema.  Neurological: Grossly intact via light touch  Musculoskeletal Exam: No pedal deformities noted.  Associated tenderness to palpation along the lesion   Assessment/Plan of Care: 1.  Superficial puncture wound right plantar foot secondary to sea shell.  DOI: 06/27/2022  -Light debridement of the area was performed today.  Unable to identify any foreign body within the plantar foot or the puncture wound lesion -Prescription for doxycycline 100 mg 2 times daily #20 -Recommend triple antibiotic and a Band-Aid daily -Advised against going barefoot -Return to clinic 2 weeks     Felecia Shelling, DPM [facility]  Dr. Felecia Shelling, DPM    [address] [place], [place] [address]                Office [phone]  Fax [phone]

## 2022-07-13 ENCOUNTER — Other Ambulatory Visit: Payer: Self-pay | Admitting: Internal Medicine

## 2022-07-15 ENCOUNTER — Other Ambulatory Visit: Payer: Self-pay | Admitting: Nurse Practitioner

## 2022-07-25 ENCOUNTER — Other Ambulatory Visit: Payer: Self-pay

## 2022-07-25 MED ORDER — DICYCLOMINE HCL 20 MG PO TABS: ORAL_TABLET | ORAL | 3 refills | Status: DC

## 2022-07-28 ENCOUNTER — Ambulatory Visit: Payer: 59 | Admitting: Internal Medicine

## 2022-08-01 ENCOUNTER — Ambulatory Visit (INDEPENDENT_AMBULATORY_CARE_PROVIDER_SITE_OTHER): Payer: 59 | Admitting: Podiatry

## 2022-08-01 DIAGNOSIS — S91331A Puncture wound without foreign body, right foot, initial encounter: Secondary | ICD-10-CM | POA: Diagnosis not present

## 2022-08-01 NOTE — Progress Notes (Signed)
Chief Complaint  Patient presents with   Foot Pain    Patient came in today for a right foot puncture in the arch of the foot     HPI: 52 y.o. female presenting today for follow-up evaluation as a superficial foreign body to the plantar aspect of the right foot.  Patient doing better.  Says that she has no pain in the area when she presses on it.  No new complaints  Past Medical History:  Diagnosis Date   Anxiety 12/14/2011   Asthma    no inhaler   Cataract    Diastolic dysfunction    a. 10/2015 Echo: EF nl, mod diast dysfxn. Mild MR/TR; b. 09/2016 Echo: EF 55-60%, no rwma. Nl RV size and fxn; c. 03/2020 Echo: EF 60-65%. No rwma.   Factor 5 Leiden mutation, heterozygous (HCC)    Fibromyalgia    Fracture of 5th metatarsal 03/2011   left foot -     Heart palpitations    a. Notes intermittent tachycardia (sinus). Did not tolerate beta blockers 2/2 urinary incontinence; b. 07/2017 Event monitor: Avg HR 77 (49-136). No significant ectopy/pauses. Triggered events corresponded w/ sinus rhythm, sinus arrhythmias, and artifact.   Interstitial cystitis    Non-cardiac chest pain    a. 12/2015 CTA chest: no PE. No significant coronary Ca2+. Mosaic attenuation pattern in both lungs, may reflect small airway dzs; c. 12/2015 Ex MV: Walked 6 mins. No ischemia/scar; d. 12/2019 Cor CTA: Ca2+ = 0. Nl cors. ? PFO w/o evidence of flow (not seen on echo).   Ovarian cyst    Pancreatitis    Pelvic floor dysfunction    Rheumatoid arteritis (HCC)    Sinus infection    chronic   Syncope    a. 08/2017 Event monitor: Average heart rate 77 (49-136).  Periods of sinus arrhythmia noted.  Triggered events corresponded with sinus rhythm, sinus arrhythmia, and artifact.  No significant ectopy, sustained arrhythmias, or prolonged pauses observed.   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    Past Surgical History:   Procedure Laterality Date   ABDOMINAL HYSTERECTOMY     CHOLECYSTECTOMY     CYSTOSCOPY WITH HYDRODISTENSION AND BIOPSY  04/21/2011   Dr Lorenz Coaster   LAPAROSCOPIC ASSISTED VAGINAL HYSTERECTOMY  08/21/2011   Procedure: LAPAROSCOPIC ASSISTED VAGINAL HYSTERECTOMY;  Surgeon: Jeani Hawking, MD;  Location: [facility];  Service: Gynecology;  Laterality: N/A;  OPEN LAPAROSCOPIC   LAPAROSCOPIC NISSEN FUNDOPLICATION  2007   neck ablation Left 12/29/2019   neck ablation Right 01/05/2020   SALPINGOOPHORECTOMY  08/21/2011   Procedure: SALPINGO OOPHERECTOMY;  Surgeon: Jeani Hawking, MD;  Location: [facility];  Service: Gynecology;  Laterality: Bilateral;   TUBAL LIGATION  2010   WISDOM TOOTH EXTRACTION      Allergies  Allergen Reactions   Amitriptyline Other (See Comments)    Other reaction(s): Other  Change in mental status   Amoxicillin Rash   Bromelains     Other reaction(s): Abdominal pain   Celexa [Citalopram Hydrobromide] Other (See Comments)    Other reaction(s): Other Change in mental status   Ivp Dye [Iodinated Contrast Media] Anaphylaxis    Pt reports "I do not have contrast allergy and have received IVP dye without pre-med with NO reaction."    Nortriptyline     Other reaction(s): Other   Penicillins Swelling and Rash  Pregabalin     Other reaction(s): Myalgias (muscle pain)   Bromelains [Pineapple Extract] Other (See Comments)    Severe pain   Budesonide-Formoterol Fumarate     Other reaction(s): Other   Citalopram Other (See Comments)   Mangifera Indica Swelling   Methylene Blue Other (See Comments)    Caused inflamed pancreas, per pt should not use   Sertraline Other (See Comments)    Other reaction(s): Other Suicidal thoughts   Sulfa Antibiotics Other (See Comments)   Sulfonamide Derivatives Other (See Comments)    excrutiating pain, patient states it caused her to have osteoarthritis   Bromaline Golden Pop Bromm] Rash   Other Rash    Mango    RT foot  08/01/2022  Physical Exam: General: The patient is alert and oriented x3 in no acute distress.  Dermatology: Skin is warm, dry and supple bilateral lower extremities.  The superficial puncture wound appears to have healed and resolved completely.  No erythema or drainage.  After light debridement it appears very stable with good healing  Vascular: Palpable pedal pulses bilaterally. Capillary refill within normal limits.  No appreciable edema.  No erythema.  Neurological: Grossly intact via light touch  Musculoskeletal Exam: No pedal deformities noted.  No tenderness to palpation along the lesion   Assessment/Plan of Care: 1.  Superficial puncture wound right plantar foot secondary to sea shell.  DOI: 06/27/2022  -Light debridement of the area was performed today.   -Advised against going barefoot.  Recommend good supportive tennis shoes and sneakers -Return to clinic as needed   Felecia Shelling, DPM [facility]  Dr. Felecia Shelling, DPM    [address] [place], [place] [address]                Office [phone]  Fax [phone]

## 2022-08-07 ENCOUNTER — Other Ambulatory Visit: Payer: Self-pay | Admitting: Internal Medicine

## 2022-08-07 ENCOUNTER — Other Ambulatory Visit: Payer: 59

## 2022-08-07 ENCOUNTER — Ambulatory Visit: Payer: 59 | Admitting: Internal Medicine

## 2022-08-07 DIAGNOSIS — E039 Hypothyroidism, unspecified: Secondary | ICD-10-CM | POA: Diagnosis not present

## 2022-08-07 DIAGNOSIS — E876 Hypokalemia: Secondary | ICD-10-CM | POA: Diagnosis not present

## 2022-08-07 DIAGNOSIS — E785 Hyperlipidemia, unspecified: Secondary | ICD-10-CM | POA: Diagnosis not present

## 2022-08-08 ENCOUNTER — Ambulatory Visit (INDEPENDENT_AMBULATORY_CARE_PROVIDER_SITE_OTHER): Payer: 59 | Admitting: Internal Medicine

## 2022-08-08 VITALS — BP 144/88 | HR 113 | Ht 65.0 in | Wt 205.0 lb

## 2022-08-08 DIAGNOSIS — D6851 Activated protein C resistance: Secondary | ICD-10-CM | POA: Diagnosis not present

## 2022-08-08 DIAGNOSIS — E669 Obesity, unspecified: Secondary | ICD-10-CM | POA: Diagnosis not present

## 2022-08-08 DIAGNOSIS — B029 Zoster without complications: Secondary | ICD-10-CM

## 2022-08-08 DIAGNOSIS — R7303 Prediabetes: Secondary | ICD-10-CM | POA: Diagnosis not present

## 2022-08-08 LAB — CMP14+EGFR
ALT: 18 IU/L (ref 0–32)
AST: 18 IU/L (ref 0–40)
Albumin: 4.5 g/dL (ref 3.9–4.9)
Alkaline Phosphatase: 54 IU/L (ref 44–121)
BUN/Creatinine Ratio: 7 — ABNORMAL LOW (ref 9–23)
BUN: 5 mg/dL — ABNORMAL LOW (ref 6–24)
Bilirubin Total: 0.2 mg/dL (ref 0.0–1.2)
CO2: 22 mmol/L (ref 20–29)
Calcium: 9.4 mg/dL (ref 8.7–10.2)
Chloride: 97 mmol/L (ref 96–106)
Creatinine, Ser: 0.72 mg/dL (ref 0.57–1.00)
Globulin, Total: 2.5 g/dL (ref 1.5–4.5)
Glucose: 94 mg/dL (ref 70–99)
Potassium: 4.6 mmol/L (ref 3.5–5.2)
Sodium: 135 mmol/L (ref 134–144)
Total Protein: 7 g/dL (ref 6.0–8.5)
eGFR: 102 mL/min/{1.73_m2} (ref >59)

## 2022-08-08 LAB — LIPID PANEL W/O CHOL/HDL RATIO
Cholesterol, Total: 178 mg/dL (ref 100–199)
HDL: 81 mg/dL (ref >39)
LDL Chol Calc (NIH): 79 mg/dL (ref 0–99)
Triglycerides: 100 mg/dL (ref 0–149)
VLDL Cholesterol Cal: 18 mg/dL (ref 5–40)

## 2022-08-08 LAB — TSH: TSH: 2.05 u[IU]/mL (ref 0.450–4.500)

## 2022-08-08 MED ORDER — VALACYCLOVIR HCL 500 MG PO TABS: 500.0000 mg | ORAL_TABLET | Freq: Two times a day (BID) | ORAL | 2 refills | Status: DC

## 2022-08-08 NOTE — Progress Notes (Unsigned)
Established Patient Office Visit  Subjective:  Patient ID: April [name], female    DOB: [birth year]  Age: 52 y.o. MRN: [id number]  Chief Complaint  Patient presents with   Follow-up    3 months labs     No new complaints, here for lab review and medication refills. LDL and TC well controlled on lab review. Triglycerides also satisfactory. Sodium has normalized on CMP review.     No other concerns at this time.   Past Medical History:  Diagnosis Date   Anxiety 12/14/2011   Asthma    no inhaler   Cataract    Diastolic dysfunction    a. 10/2015 Echo: EF nl, mod diast dysfxn. Mild MR/TR; b. 09/2016 Echo: EF 55-60%, no rwma. Nl RV size and fxn; c. 03/2020 Echo: EF 60-65%. No rwma.   Factor 5 Leiden mutation, heterozygous (HCC)    Fibromyalgia    Fracture of 5th metatarsal 03/2011   left foot -     Heart palpitations    a. Notes intermittent tachycardia (sinus). Did not tolerate beta blockers 2/2 urinary incontinence; b. 07/2017 Event monitor: Avg HR 77 (49-136). No significant ectopy/pauses. Triggered events corresponded w/ sinus rhythm, sinus arrhythmias, and artifact.   Interstitial cystitis    Non-cardiac chest pain    a. 12/2015 CTA chest: no PE. No significant coronary Ca2+. Mosaic attenuation pattern in both lungs, may reflect small airway dzs; c. 12/2015 Ex MV: Walked 6 mins. No ischemia/scar; d. 12/2019 Cor CTA: Ca2+ = 0. Nl cors. ? PFO w/o evidence of flow (not seen on echo).   Ovarian cyst    Pancreatitis    Pelvic floor dysfunction    Rheumatoid arteritis (HCC)    Sinus infection    chronic   Syncope    a. 08/2017 Event monitor: Average heart rate 77 (49-136).  Periods of sinus arrhythmia noted.  Triggered events corresponded with sinus rhythm, sinus arrhythmia, and artifact.  No significant ectopy, sustained arrhythmias, or prolonged pauses observed.   Thrombosis    a. 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    Past Surgical History:  Procedure Laterality Date   ABDOMINAL HYSTERECTOMY     CHOLECYSTECTOMY     CYSTOSCOPY WITH HYDRODISTENSION AND BIOPSY  04/21/2011   Dr Lorenz Coaster   LAPAROSCOPIC ASSISTED VAGINAL HYSTERECTOMY  08/21/2011   Procedure: LAPAROSCOPIC ASSISTED VAGINAL HYSTERECTOMY;  Surgeon: Jeani Hawking, MD;  Location: [facility];  Service: Gynecology;  Laterality: N/A;  OPEN LAPAROSCOPIC   LAPAROSCOPIC NISSEN FUNDOPLICATION  2007   neck ablation Left 12/29/2019   neck ablation Right 01/05/2020   SALPINGOOPHORECTOMY  08/21/2011   Procedure: SALPINGO OOPHERECTOMY;  Surgeon: Jeani Hawking, MD;  Location: [facility];  Service: Gynecology;  Laterality: Bilateral;   TUBAL LIGATION  2010   WISDOM TOOTH EXTRACTION      Social History   Socioeconomic History   Marital status: Married    Spouse name: Not on file   Number of children: Not on file   Years of education: Not on file   Highest education level: Not on file  Occupational History   Not on file  Tobacco Use   Smoking status: Never   Smokeless tobacco: Never  Vaping Use   Vaping Use: Never used  Substance and Sexual Activity   Alcohol use: No   Drug use: No   Sexual activity: Yes  Birth control/protection: Surgical  Other Topics Concern   Not on file  Social History Narrative   Not on file   Social Determinants of Health   Financial Resource Strain: Not on file  Food Insecurity: Not on file  Transportation Needs: Not on file  Physical Activity: Not on file  Stress: Not on file  Social Connections: Not on file  Intimate Partner Violence: Not on file    Family History  Problem Relation Age of Onset   Heart Problems Mother    Vascular Disease Mother    Skin cancer Mother    Vascular Disease Maternal Grandmother    Kidney Stones Father    Skin cancer Maternal Uncle    Anesthesia problems Neg Hx     Allergies  Allergen Reactions   Amitriptyline Other (See Comments)     Other reaction(Tyneka Scafidi): Other  Change in mental status   Amoxicillin Rash   Bromelains     Other reaction(Kailah Pennel): Abdominal pain   Celexa [Citalopram Hydrobromide] Other (See Comments)    Other reaction(Vanesha Athens): Other Change in mental status   Ivp Dye [Iodinated Contrast Media] Anaphylaxis    Pt reports "I do not have contrast allergy and have received IVP dye without pre-med with NO reaction."    Nortriptyline     Other reaction(Guilianna Mckoy): Other   Penicillins Swelling and Rash   Pregabalin     Other reaction(Felicita Nuncio): Myalgias (muscle pain)   Bromelains [Pineapple Extract] Other (See Comments)    Severe pain   Budesonide-Formoterol Fumarate     Other reaction(Jajaira Ruis): Other   Citalopram Other (See Comments)   Mangifera Indica Swelling   Methylene Blue Other (See Comments)    Caused inflamed pancreas, per pt should not use   Sertraline Other (See Comments)    Other reaction(Kannen Moxey): Other Suicidal thoughts   Sulfa Antibiotics Other (See Comments)   Sulfonamide Derivatives Other (See Comments)    excrutiating pain, patient states it caused her to have osteoarthritis   Bromaline Golden Pop Bromm] Rash   Other Rash    Mango    Review of Systems  Constitutional: Negative.   HENT: Negative.    Eyes: Negative.   Respiratory: Negative.    Cardiovascular: Negative.   Gastrointestinal: Negative.   Genitourinary: Negative.   Skin: Negative.   Neurological: Negative.   Endo/Heme/Allergies: Negative.        Objective:   BP (!) 144/88   Pulse (!) 113   Ht 5\' 5"  (1.651 m)   Wt 205 lb (93 kg)   LMP 05/20/2011   SpO2 97%   BMI 34.11 kg/m   Vitals:   08/08/22 1443  BP: (!) 144/88  Pulse: (!) 113  Height: 5\' 5"  (1.651 m)  Weight: 205 lb (93 kg)  SpO2: 97%  BMI (Calculated): 34.11    Physical Exam Vitals reviewed.  Constitutional:      General: She is not in acute distress. HENT:     Head: Normocephalic.     Nose: Nose normal.     Mouth/Throat:     Mouth: Mucous membranes are moist.   Eyes:     Extraocular Movements: Extraocular movements intact.     Pupils: Pupils are equal, round, and reactive to light.  Cardiovascular:     Rate and Rhythm: Normal rate and regular rhythm.     Heart sounds: No murmur heard. Pulmonary:     Effort: Pulmonary effort is normal.     Breath sounds: No rhonchi or rales.  Abdominal:  General: Bowel sounds are normal.     Palpations: Abdomen is soft. There is no hepatomegaly, splenomegaly or mass.     Comments: Stimulator in right gluteus  Musculoskeletal:        General: Normal range of motion.     Cervical back: Normal range of motion. No tenderness.  Skin:    General: Skin is warm and dry.  Neurological:     General: No focal deficit present.     Mental Status: She is alert and oriented to person, place, and time.     Cranial Nerves: No cranial nerve deficit.     Motor: No weakness.  Psychiatric:        Mood and Affect: Mood normal.        Behavior: Behavior normal.      No results found for any visits on 08/08/22.  Recent Results (from the past 2160 hour(Micaela Stith))  CBC     Status: Abnormal   Collection Time: 05/29/22  6:47 PM  Result Value Ref Range   WBC 6.2 4.0 - 10.5 K/uL   RBC 3.82 (L) 3.87 - 5.11 MIL/uL   Hemoglobin 12.8 12.0 - 15.0 g/dL   HCT 96.7 89.3 - 81.0 %   MCV 95.5 80.0 - 100.0 fL   MCH 33.5 26.0 - 34.0 pg   MCHC 35.1 30.0 - 36.0 g/dL   RDW 17.5 10.2 - 58.5 %   Platelets 251 150 - 400 K/uL   nRBC 0.0 0.0 - 0.2 %    Comment: Performed at [facility], [address], [place], [place] [address]  Troponin I (High Sensitivity)     Status: None   Collection Time: 05/29/22  6:47 PM  Result Value Ref Range   Troponin I (High Sensitivity) 4 <18 ng/L    Comment: (NOTE) Elevated high sensitivity troponin I (hsTnI) values and significant  changes across serial measurements may suggest ACS but many other  chronic and acute conditions are known to elevate hsTnI results.  Refer to the "Links" section for  chest pain algorithms and additional  guidance. Performed at [facility], [address], [place], [place] [address]   Comprehensive metabolic panel     Status: Abnormal   Collection Time: 05/29/22  6:47 PM  Result Value Ref Range   Sodium 133 (L) 135 - 145 mmol/L   Potassium 4.1 3.5 - 5.1 mmol/L   Chloride 98 98 - 111 mmol/L   CO2 24 22 - 32 mmol/L   Glucose, Bld 98 70 - 99 mg/dL    Comment: Glucose reference range applies only to samples taken after fasting for at least 8 hours.   BUN 6 6 - 20 mg/dL   Creatinine, Ser 6.14 0.44 - 1.00 mg/dL   Calcium 9.6 8.9 - 43.1 mg/dL   Total Protein 6.9 6.5 - 8.1 g/dL   Albumin 4.1 3.5 - 5.0 g/dL   AST 30 15 - 41 U/L   ALT 39 0 - 44 U/L   Alkaline Phosphatase 40 38 - 126 U/L   Total Bilirubin 0.4 0.3 - 1.2 mg/dL   GFR, Estimated >54 >00 mL/min    Comment: (NOTE) Calculated using the CKD-EPI Creatinine Equation (2021)    Anion gap 11 5 - 15    Comment: Performed at [facility], [address] [place], [place] [address]  D-dimer, quantitative     Status: None   Collection Time: 05/29/22  6:47 PM  Result Value Ref Range  D-Dimer, Quant 0.40 0.00 - 0.50 ug/mL-FEU    Comment: (NOTE) At the manufacturer cut-off value of 0.5 g/mL FEU, this assay has a negative predictive value of 95-100%.This assay is intended for use in conjunction with a clinical pretest probability (PTP) assessment model to exclude pulmonary embolism (PE) and deep venous thrombosis (DVT) in outpatients suspected of PE or DVT. Results should be correlated with clinical presentation. Performed at [facility], [address] [place], [place] [address]   Troponin I (High Sensitivity)     Status: None   Collection Time: 05/29/22  8:09 PM  Result Value Ref Range   Troponin I (High Sensitivity) 3 <18 ng/L    Comment: (NOTE) Elevated high sensitivity troponin I (hsTnI) values and significant  changes across serial measurements may suggest ACS but  many other  chronic and acute conditions are known to elevate hsTnI results.  Refer to the "Links" section for chest pain algorithms and additional  guidance. Performed at [facility], [address] [place], [place] [address]   CMP14+EGFR     Status: Abnormal   Collection Time: 08/07/22  8:35 AM  Result Value Ref Range   Glucose 94 70 - 99 mg/dL   BUN 5 (L) 6 - 24 mg/dL   Creatinine, Ser 3.47 0.57 - 1.00 mg/dL   eGFR 425 >95 GL/OVF/6.43   BUN/Creatinine Ratio 7 (L) 9 - 23   Sodium 135 134 - 144 mmol/L   Potassium 4.6 3.5 - 5.2 mmol/L   Chloride 97 96 - 106 mmol/L   CO2 22 20 - 29 mmol/L   Calcium 9.4 8.7 - 10.2 mg/dL   Total Protein 7.0 6.0 - 8.5 g/dL   Albumin 4.5 3.9 - 4.9 g/dL   Globulin, Total 2.5 1.5 - 4.5 g/dL   Bilirubin Total 0.2 0.0 - 1.2 mg/dL   Alkaline Phosphatase 54 44 - 121 IU/L   AST 18 0 - 40 IU/L   ALT 18 0 - 32 IU/L  Lipid Panel w/o Chol/HDL Ratio     Status: None   Collection Time: 08/07/22  8:35 AM  Result Value Ref Range   Cholesterol, Total 178 100 - 199 mg/dL   Triglycerides 329 0 - 149 mg/dL   HDL 81 >51 mg/dL   VLDL Cholesterol Cal 18 5 - 40 mg/dL   LDL Chol Calc (NIH) 79 0 - 99 mg/dL  TSH     Status: None   Collection Time: 08/07/22  8:35 AM  Result Value Ref Range   TSH 2.050 0.450 - 4.500 uIU/mL      Assessment & Plan:  As per problem list.  Problem List Items Addressed This Visit       Hematopoietic and Hemostatic   Factor V Leiden mutation (HCC) (Chronic)     Other   Prediabetes - Primary   Obesity (BMI 30-39.9)   Relevant Orders   Comprehensive metabolic panel    Return in about 3 months (around 11/08/2022) for fu with labs prior.   Total time spent: 20 minutes  Luna Fuse, MD  08/08/2022   This document may have been prepared by [facility] Voice Recognition software and as such may include unintentional dictation errors.

## 2022-08-17 ENCOUNTER — Other Ambulatory Visit: Payer: Self-pay | Admitting: Internal Medicine

## 2022-08-21 ENCOUNTER — Other Ambulatory Visit: Payer: Self-pay | Admitting: Internal Medicine

## 2022-08-23 ENCOUNTER — Telehealth: Payer: Self-pay

## 2022-08-24 ENCOUNTER — Other Ambulatory Visit: Payer: Self-pay | Admitting: Nurse Practitioner

## 2022-08-24 ENCOUNTER — Other Ambulatory Visit: Payer: Self-pay | Admitting: Internal Medicine

## 2022-08-24 NOTE — Telephone Encounter (Signed)
Hi,   Could you please schedule this patient a 6 month follow up appointment? The patient was last seen on 03-03-22. Thank you so much.

## 2022-08-25 NOTE — Telephone Encounter (Signed)
Spoke with a different provider to have concerns addressed.

## 2022-08-25 NOTE — Telephone Encounter (Signed)
Left appt to schedule follow up appt.

## 2022-08-28 ENCOUNTER — Telehealth: Payer: Self-pay | Admitting: Internal Medicine

## 2022-08-28 MED ORDER — DILTIAZEM HCL 30 MG PO TABS: ORAL_TABLET | ORAL | 0 refills | Status: DC

## 2022-08-28 NOTE — Telephone Encounter (Signed)
*  STAT* If patient is at the pharmacy, call can be transferred to refill team.   1. Which medications need to be refilled? (please list name of each medication and dose if known)   diltiazem (CARDIZEM) 30 MG tablet   2. Would you like to learn more about the convenience, safety, & potential cost savings by using the [facility]?   3. Are you open to using the [facility] (Type [facility]. ).  4. Which pharmacy/location (including street and city if local pharmacy) is medication to be sent to?  [facility] - [place], [place] - [address]   5. Do they need a 30 day or 90 day supply?   90 day  Patient stated she is completely out of this medication.  Patient has appointment scheduled on 8/23.

## 2022-08-28 NOTE — Telephone Encounter (Signed)
Requested Prescriptions   Signed Prescriptions Disp Refills   diltiazem (CARDIZEM) 30 MG tablet 30 tablet 0    Sig: TAKE 1 TABLET BY MOUTH EVERY 8 HOURS AS NEEDED FOR BREAKTHROUGH PALPITATIONS AS DIRECTED.    Authorizing Provider: END, CHRISTOPHER    Ordering User: Guerry Minors

## 2022-09-08 ENCOUNTER — Other Ambulatory Visit: Payer: Self-pay | Admitting: Internal Medicine

## 2022-09-11 ENCOUNTER — Other Ambulatory Visit: Payer: Self-pay | Admitting: Family

## 2022-09-15 ENCOUNTER — Other Ambulatory Visit: Payer: Self-pay | Admitting: Family

## 2022-09-18 ENCOUNTER — Other Ambulatory Visit: Payer: Self-pay | Admitting: Family

## 2022-09-18 ENCOUNTER — Other Ambulatory Visit: Payer: Self-pay

## 2022-09-18 ENCOUNTER — Other Ambulatory Visit: Payer: Self-pay | Admitting: Internal Medicine

## 2022-09-18 DIAGNOSIS — I5032 Chronic diastolic (congestive) heart failure: Secondary | ICD-10-CM | POA: Diagnosis not present

## 2022-09-18 DIAGNOSIS — M797 Fibromyalgia: Secondary | ICD-10-CM | POA: Diagnosis not present

## 2022-09-18 DIAGNOSIS — Z8669 Personal history of other diseases of the nervous system and sense organs: Secondary | ICD-10-CM | POA: Diagnosis not present

## 2022-09-18 DIAGNOSIS — M6289 Other specified disorders of muscle: Secondary | ICD-10-CM | POA: Diagnosis not present

## 2022-09-18 DIAGNOSIS — N3010 Interstitial cystitis (chronic) without hematuria: Secondary | ICD-10-CM | POA: Diagnosis not present

## 2022-09-19 DIAGNOSIS — Z1231 Encounter for screening mammogram for malignant neoplasm of breast: Secondary | ICD-10-CM | POA: Diagnosis not present

## 2022-09-22 ENCOUNTER — Ambulatory Visit: Payer: 59 | Admitting: Physician Assistant

## 2022-09-22 ENCOUNTER — Ambulatory Visit (INDEPENDENT_AMBULATORY_CARE_PROVIDER_SITE_OTHER): Payer: 59

## 2022-09-22 ENCOUNTER — Encounter: Payer: Self-pay | Admitting: Cardiology

## 2022-09-22 ENCOUNTER — Ambulatory Visit: Payer: 59 | Attending: Cardiology | Admitting: Cardiology

## 2022-09-22 VITALS — BP 126/82 | HR 73 | Ht 65.0 in | Wt 205.2 lb

## 2022-09-22 DIAGNOSIS — Z8679 Personal history of other diseases of the circulatory system: Secondary | ICD-10-CM | POA: Diagnosis not present

## 2022-09-22 DIAGNOSIS — R002 Palpitations: Secondary | ICD-10-CM | POA: Diagnosis not present

## 2022-09-22 DIAGNOSIS — R0789 Other chest pain: Secondary | ICD-10-CM | POA: Diagnosis not present

## 2022-09-22 DIAGNOSIS — R600 Localized edema: Secondary | ICD-10-CM | POA: Diagnosis not present

## 2022-09-22 DIAGNOSIS — R42 Dizziness and giddiness: Secondary | ICD-10-CM | POA: Diagnosis not present

## 2022-09-22 NOTE — Patient Instructions (Signed)
Medication Instructions:  Your Physician recommend you continue on your current medication as directed.    *If you need a refill on your cardiac medications before your next appointment, please call your pharmacy*   Lab Work: None ordered If you have labs (blood work) drawn today and your tests are completely normal, you will receive your results only by: MyChart Message (if you have MyChart) OR A paper copy in the mail If you have any lab test that is abnormal or we need to change your treatment, we will call you to review the results.   Testing/Procedures:   Your physician has requested that you have an echocardiogram. Echocardiography is a painless test that uses sound waves to create images of your heart. It provides your doctor with information about the size and shape of your heart and how well your heart's chambers and valves are working.   You may receive an ultrasound enhancing agent through an IV if needed to better visualize your heart during the echo. This procedure takes approximately one hour.  There are no restrictions for this procedure.  This will take place at [address] [address], [place] [address]  Christena Deem- Long Term Monitor Instructions  Your physician has requested you wear a ZIO patch monitor for 14 days.  This is a single patch monitor. [employer] supplies one patch monitor per enrollment. Additional stickers are not available. Please do not apply patch if you will be having a Nuclear Stress Test,  Echocardiogram, Cardiac CT, MRI, or Chest Xray during the period you would be wearing the  monitor. The patch cannot be worn during these tests. You cannot remove and re-apply the  ZIO XT patch monitor.  Your ZIO patch monitor will be mailed 3 day USPS to your address on file. It may take 3-5 days  to receive your monitor after you have been enrolled.  Once you have received your monitor, please review the enclosed instructions. Your monitor   has already been registered assigning a specific monitor serial # to you.    Billing and Patient Assistance Program Information  We have supplied [employer] with any of your insurance information on file for billing purposes. [employer] offers a sliding scale Patient Assistance Program for patients that do not have  insurance, or whose insurance does not completely cover the cost of the ZIO monitor.  You must apply for the Patient Assistance Program to qualify for this discounted rate.  To apply, please call [employer] at [phone], select option 4, select option 2, ask to apply for  Patient Assistance Program. Meredeth Ide will ask your household income, and how many people  are in your household. They will quote your out-of-pocket cost based on that information.  [employer] will also be able to set up a 33-month, interest-free payment plan if needed.  Applying the monitor   Shave hair from upper left chest.  Hold abrader disc by orange tab. Rub abrader in 40 strokes over the upper left chest as  indicated in your monitor instructions.  Clean area with 4 enclosed alcohol pads. Let dry.  Apply patch as indicated in monitor instructions. Patch will be placed under collarbone on left  side of chest with arrow pointing upward.  Rub patch adhesive wings for 2 minutes. Remove white label marked "1". Remove the white  label marked "2". Rub patch adhesive wings for 2 additional minutes.  While looking in a mirror, press and release button in center of patch. A small green  light will  flash 3-4 times. This will be your only indicator that the monitor has been turned on.  Do not shower for the first 24 hours. You may shower after the first 24 hours.  Press the button if you feel a symptom. You will hear a small click. Record Date, Time and  Symptom in the Patient Logbook.  When you are ready to remove the patch, follow instructions on the last 2 pages of Patient  Logbook. Stick patch monitor onto the last  page of Patient Logbook.  Place Patient Logbook in the blue and white box. Use locking tab on box and tape box closed  securely. The blue and white box has prepaid postage on it. Please place it in the mailbox as  soon as possible. Your physician should have your test results approximately 7 days after the  monitor has been mailed back to [facility].  Call [facility] Customer Care at [phone] if you have questions regarding  your ZIO XT patch monitor. Call them immediately if you see an orange light blinking on your  monitor.  If your monitor falls off in less than 4 days, contact our Monitor department at [phone].  If your monitor becomes loose or falls off after 4 days call [employer] at [phone] for  suggestions on securing your monitor    Follow-Up: At [facility], you and your health needs are our priority.  As part of our continuing mission to provide you with exceptional heart care, we have created designated Provider Care Teams.  These Care Teams include your primary Cardiologist (physician) and Advanced Practice Providers (APPs -  Physician Assistants and Nurse Practitioners) who all work together to provide you with the care you need, when you need it.  We recommend signing up for the patient portal called "MyChart".  Sign up information is provided on this After Visit Summary.  MyChart is used to connect with patients for Virtual Visits (Telemedicine).  Patients are able to view lab/test results, encounter notes, upcoming appointments, etc.  Non-urgent messages can be sent to your provider as well.   To learn more about what you can do with MyChart, go to [email or web address].    Your next appointment:   3 month(s)  Provider:   Yvonne Kendall, MD

## 2022-09-22 NOTE — Progress Notes (Signed)
Cardiology Office Note:  .   Date:  09/22/2022  ID:  April [name], DOB [birth year], MRN [id number] PCP: April Monday, MD  [facility] Providers Cardiologist:  April Kendall, MD    History of Present Illness: .   April [name] is a 52 y.o. female with past medical history of gonadal vein thrombosis, fibromyalgia, Factor 5 Leiden mutation previously on Xarelto which was discontinued for severe anemia, bladder stimulator, diastolic dysfunction, anemia, and atypical chest pain.   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  She was previously on Xarelto however that was discontinued in the setting of severe anemia.  Other history includes obesity, asthma, rheumatoid arthritis, urinary incontinence and multiple drug allergies.  She had an echocardiogram in September 2016 in the setting of dyspnea that showed normal LV function with moderate diastolic dysfunction.  In November 2017 she underwent stress testing in setting of atypical chest pain, which did not show any evidence of ischemia or scar.  She has a history of palpitations for which she was previously on beta-blocker (metoprolol followed by carvedilol).  Beta-blocker therapy worsening urinary incontinence she has since been managed on diltiazem.  A follow-up echocardiogram in September 2018 showed stable/normal LV function without mention of diastolic dysfunction.  Her most recent echocardiogram in 03/2020 showed EF 60 to 65% with normal diastolic parameters.  In November 2021 she underwent coronary CT angiography in setting of ongoing atypical symptoms of chest and neck pain.  This showed a coronary calcium score of 0 with normal coronary arteries.  There was question of a PFO, though this was apparently not felt to be significant by structural heart team and was not seen on follow-up echo in February 2022.  She was evaluated by  Dr. Graciela [name] in 12/2020 for POTs evaluation.  It was determined that she did not have POTS as her orthostatic vital signs in her medical care showed no evidence of tachycardia.  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   In 05/2021 she reported to the [facility], ED for palpitations, headache and chest pain.  Her workup was overall reassuring although she was noted to have mild hyponatremia and hypokalemia.  She was seen by Dr. Okey [name] in office a week later experiencing more frequent palpitations, she reported missing several days of her diltiazem.  Dr. Okey [name] noted that she does not have a diagnosis of heart failure, and given her recurrent hyponatremia it was recommended that she discontinue furosemide.  However on 1 month follow-up she had restarted Lasix following a 10 pound weight gain in 4 days.  The importance of staying well-hydrated and maintaining normal electrolytes was reinforced.  She was last seen in office on 03/03/2022 for follow-up.  She reported increased palpitations with episodes of headache and chest discomfort.  Her EKG showed normal sinus rhythm.  Her diltiazem was increased to 120 mg twice daily with diltiazem 30 mg for breakthrough palpitations.  On 05/29/2022 she presented to [facility], ED for atypical chest pain.  Her chest wall pain was reproducible on palpation, her cardiac workup was unremarkable.  Her D-dimer was negative.  High-sensitivity troponin was negative x 2.  Today she presents for follow-up.  Reports that she has been doing okay.  Notes increased palpitations requiring as needed Cardizem frequently.  Notes palpitations can occur at random or when excited, upon awakening in the morning or when  at church.  Notes she will feel like she gets punched in the chest followed by prolonged palpitations requiring her Cardizem, accompanied by headache and dizziness.  She reports intermittent dizziness that can be associated with position changes or at random.  She reports multiple  plain complaints including atypical chest pain, dizziness, leg aching, tooth pain and overall feeling unwell specifically with barometric pressure changes.  She has history of low-grade fevers.  Had been previously recommended that she obtain an AliveCor monitor, she has not done so.  ROS: Today she denies chest pain, shortness of breath, lower extremity edema, fatigue, palpitations, melena, hematuria, hemoptysis, diaphoresis, weakness, presyncope, syncope, orthopnea, and PND.   Studies Reviewed: [name]   EKG Interpretation Date/Time:  Friday September 22 2022 14:06:05 EDT Ventricular Rate:  73 PR Interval:  118 QRS Duration:  78 QT Interval:  356 QTC Calculation: 392 R Axis:   36  Text Interpretation: Normal sinus rhythm with sinus arrhythmia Normal ECG Confirmed by Reather Littler [phone]) on 09/22/2022 2:18:30 PM   Cardiac Studies & Procedures     STRESS TESTS  MYOCARDIAL PERFUSION IMAGING 12/16/2015  Narrative  Nuclear stress EF: 64%.  Blood pressure demonstrated a normal response to exercise.  The study is normal.  This is a low risk study.  The left ventricular ejection fraction is normal (55-65%).   ECHOCARDIOGRAM  ECHOCARDIOGRAM COMPLETE 03/12/2020  Narrative ECHOCARDIOGRAM REPORT    Patient Name:   April [name] Date of Exam: 03/12/2020 Medical Rec #:  [id number]      Height:       67.0 in Accession #:    [id number]     Weight:       159.0 lb Date of Birth:  1970/11/11      BSA:          1.834 m Patient Age:    49 years       BP:           118/82 mmHg Patient Gender: F              HR:           80 bpm. Exam Location:  [place]  Procedure: 2D Echo, Cardiac Doppler and Color Doppler  Indications:    R06.02 SOB; R07.9* Chest pain, unspecified  History:        Patient has prior history of Echocardiogram examinations, most recent 02/11/2019. Signs/Symptoms:Shortness of Breath, Chest Pain, Syncope and Dizziness/Lightheadedness; Risk Factors:Non-Smoker.  Sonographer:     April [name] RDMS, RVT, RDCS Referring Phys: [id number] April [name]   Sonographer Comments: Extremely limited windows and extremely intolerant to transducer pressure IMPRESSIONS   1. Left ventricular ejection fraction, by estimation, is 60 to 65%. The left ventricle has normal function. The left ventricle has no regional wall motion abnormalities. Left ventricular diastolic parameters were normal. 2. Right ventricular systolic function is normal. The right ventricular size is normal. 3. The inferior vena cava is dilated in size with >50% respiratory variability, suggesting right atrial pressure of 8 mmHg.  FINDINGS Left Ventricle: Left ventricular ejection fraction, by estimation, is 60 to 65%. The left ventricle has normal function. The left ventricle has no regional wall motion abnormalities. The left ventricular internal cavity size was normal in size. There is no left ventricular hypertrophy. Left ventricular diastolic parameters were normal.  Right Ventricle: The right ventricular size is normal. No increase in right ventricular wall thickness. Right ventricular systolic function is normal.  Left Atrium: Left atrial size was  normal in size.  Right Atrium: Right atrial size was normal in size.  Pericardium: There is no evidence of pericardial effusion.  Mitral Valve: The mitral valve is normal in structure. No evidence of mitral valve regurgitation. No evidence of mitral valve stenosis.  Tricuspid Valve: The tricuspid valve is normal in structure. Tricuspid valve regurgitation is not demonstrated. No evidence of tricuspid stenosis.  Aortic Valve: The aortic valve is normal in structure. Aortic valve regurgitation is not visualized. No aortic stenosis is present. Aortic valve mean gradient measures 3.0 mmHg. Aortic valve peak gradient measures 5.4 mmHg. Aortic valve area, by VTI measures 2.97 cm.  Pulmonic Valve: The pulmonic valve was normal in structure. Pulmonic valve  regurgitation is not visualized. No evidence of pulmonic stenosis.  Aorta: The aortic root is normal in size and structure.  Venous: The inferior vena cava is dilated in size with greater than 50% respiratory variability, suggesting right atrial pressure of 8 mmHg.  IAS/Shunts: No atrial level shunt detected by color flow Doppler.   LEFT VENTRICLE PLAX 2D LVIDd:         4.30 cm     Diastology LVIDs:         3.20 cm     LV e' medial:    10.00 cm/s LV PW:         0.70 cm     LV E/e' medial:  9.6 LV IVS:        0.70 cm     LV e' lateral:   8.05 cm/s LVOT diam:     1.90 cm     LV E/e' lateral: 11.9 LV SV:         62 LV SV Index:   34 LVOT Area:     2.84 cm  LV Volumes (MOD) LV vol d, MOD A2C: 54.0 ml LV vol d, MOD A4C: 59.1 ml LV vol s, MOD A2C: 24.7 ml LV vol s, MOD A4C: 26.1 ml LV SV MOD A2C:     29.3 ml LV SV MOD A4C:     59.1 ml LV SV MOD BP:      31.2 ml  RIGHT VENTRICLE            IVC RV S prime:     9.68 cm/s  IVC diam: 2.10 cm TAPSE (M-mode): 2.5 cm  LEFT ATRIUM             Index LA diam:        3.70 cm 2.02 cm/m LA Vol (A2C):   33.2 ml 18.10 ml/m LA Vol (A4C):   35.6 ml 19.41 ml/m LA Biplane Vol: 37.1 ml 20.22 ml/m AORTIC VALVE                   PULMONIC VALVE AV Area (Vmax):    2.49 cm    PV Vmax:       0.64 m/s AV Area (Vmean):   2.51 cm    PV Peak grad:  1.7 mmHg AV Area (VTI):     2.97 cm AV Vmax:           116.00 cm/s AV Vmean:          78.300 cm/s AV VTI:            0.208 m AV Peak Grad:      5.4 mmHg AV Mean Grad:      3.0 mmHg LVOT Vmax:         102.00 cm/s LVOT Vmean:  69.200 cm/s LVOT VTI:          0.218 m LVOT/AV VTI ratio: 1.05  AORTA Ao Root diam: 2.50 cm Ao Asc diam:  2.70 cm Ao Arch diam: 2.4 cm  MITRAL VALVE MV Area (PHT): 4.21 cm    SHUNTS MV Decel Time: 180 msec    Systemic VTI:  0.22 m MV E velocity: 96.00 cm/s  Systemic Diam: 1.90 cm MV A velocity: 64.70 cm/s MV E/A ratio:  1.48  Julien Nordmann MD Electronically  signed by Julien Nordmann MD Signature Date/Time: 03/12/2020/7:06:30 PM    Final    MONITORS  CARDIAC EVENT MONITOR 09/20/2017  Narrative  The patient was enrolled for 30 days; 84% of the monitoring period yielded diagnostic tracings.  The predominant rhythm was sinus with an average rate of 77 bpm (range 49-136 bpm). Periods of sinus arrhythmia were noted.  No significant ectopy, sustained arrhythmia, or prolonged pause was observed.  Patient triggered events correspond to sinus rhythm, sinus arrhythmia, and artifact.  Predominantly sinus rhythm without significant arrhythmias.   CT SCANS  CT CORONARY MORPH W/CTA COR W/SCORE 12/18/2019  Addendum 12/18/2019  6:22 PM ADDENDUM REPORT: 12/18/2019 18:20  HISTORY: Chest pain, nonspecific  EXAM: Cardiac/Coronary CT  TECHNIQUE: The patient was scanned on a [employer].  PROTOCOL: A 120 kV prospective scan was triggered in the descending thoracic aorta at 111 HU's. Axial non-contrast 3 mm slices were carried out through the heart. The data set was analyzed on a dedicated work station and scored using the Agatson method. Gantry rotation speed was 250 msecs and collimation was 0.6 mm. Beta blockade and 0.8 mg of sl NTG was given. The 3D data set was reconstructed in 5% intervals of 35-75% of the R-R cycle. Diastolic phases were analyzed on a dedicated work station using MPR, MIP and VRT modes. The patient received 80mL OMNIPAQUE IOHEXOL 300 MG/ML SOLN of contrast.  FINDINGS: Coronary calcium score: The patient's coronary artery calcium score is 0, which places the patient in the 0 percentile.  Coronary arteries: Normal coronary origins.  Left dominance.  Right Coronary Artery: Small caliber vessel, nondominant and ends at typical location of mid RCA. No significant plaque or stenosis.  Left Main Coronary Artery: Normal caliber vessel. No significant plaque or stenosis. Small ramus intermedius.  Left Anterior  Descending Coronary Artery: Normal caliber vessel. No significant plaque or stenosis. Gives rise to 2 diagonal branches.  Left Circumflex Artery: Normal caliber vessel. No significant plaque or stenosis. Gives rise to 2 OM branches.  Aorta: Normal size, 30 mm at the mid ascending aorta (level of the PA bifurcation) measured double oblique. No calcifications. No dissection.  Aortic Valve: No calcifications. Trileaflet.  Other findings:  Normal pulmonary vein drainage into the left atrium.  Normal left atrial appendage without a thrombus.  Normal size of the pulmonary artery.  Small flap seen, possible PFO, no contrast seen moving between left and right atria in this study.  IMPRESSION: 1. No evidence of CAD, CADRADS = 0.  2. Coronary calcium score of 0. This was 0 percentile for age and sex matched control.  3. Normal coronary origin with left dominance.   Electronically Signed By: Jodelle Red M.D. On: 12/18/2019 18:20  Narrative EXAM: OVER-READ INTERPRETATION  CT CHEST  The following report is an over-read performed by radiologist Dr. Jeronimo Greaves of [facility] on 12/18/2019. This over-read does not include interpretation of cardiac or coronary anatomy or pathology. The coronary CTA interpretation by  the cardiologist is attached.  COMPARISON:  12/19/2015 chest radiograph.  12/06/2015 chest CTA.  FINDINGS: Vascular: Normal aortic caliber. No central pulmonary embolism, on this non-dedicated study.  Mediastinum/Nodes: No imaged thoracic adenopathy. Tiny hiatal hernia. Surgical changes at the gastroesophageal junction.  Lungs/Pleura: No pleural fluid.  Clear imaged lungs.  Upper Abdomen: Normal imaged portions of the liver, spleen.  Musculoskeletal: No acute osseous abnormality.  IMPRESSION: No acute findings in the imaged extracardiac chest.  Surgical changes at the gastroesophageal junction (fundoplication) with tiny hiatal  hernia.  Electronically Signed: By: Jeronimo Greaves M.D. On: 12/18/2019 14:26                   Physical Exam:   VS:  BP 126/82   Pulse 73   Ht 5\' 5"  (1.651 m)   Wt 205 lb 3.2 oz (93.1 kg)   LMP 05/20/2011   SpO2 95%   BMI 34.15 kg/m    Wt Readings from Last 3 Encounters:  09/22/22 205 lb 3.2 oz (93.1 kg)  08/08/22 205 lb (93 kg)  05/29/22 194 lb 0.1 oz (88 kg)   Orthostatic VS for the past 24 hrs (Last 3 readings):  BP- Lying Pulse- Lying BP- Sitting Pulse- Sitting BP- Standing at 0 minutes Pulse- Standing at 0 minutes BP- Standing at 3 minutes Pulse- Standing at 3 minutes  09/22/22 1420 -- 78 -- 79 -- 85 -- 82  09/22/22 1408 (!) 149/105 63 131/89 63 142/84 63 (!) 135/95 82    GEN: Well nourished, well developed in no acute distress NECK: No JVD; No carotid bruits CARDIAC: RRR, no murmurs, rubs, gallops RESPIRATORY:  Clear to auscultation without rales, wheezing or rhonchi  ABDOMEN: Soft, non-tender, non-distended EXTREMITIES:  No edema; No deformity   ASSESSMENT AND PLAN: .    Palpitations/Dizziness: Managed with long-acting diltiazem and as needed short acting diltiazem.  Previous monitoring did not show any significant arrhythmias and triggered events were associated with sinus rhythm, sinus arrhythmia and artifact.  She reports intermittent worsening of her palpitations that can occur at rest or when excited such as when she is at church, associated with atypical chest pain and dizziness.  She feels that her palpitations are intermittently well-controlled with Cardizem but feels that her palpitations have potentially changed to a stronger sensation. Also notes dizziness that can occur at random lasting a few minutes, she is not orthostatic on exam today. EKG today shows sinus rhythm at 73 bpm with sinus arrhythmia. She has not obtained previously recommended AliveCor monitor, not interested at this time.  Reviewed possible triggers for palpitations such as increased caffeine,  dehydration and electrolyte imbalances.  She will wear a two week Zio monitor. Continue Cardizem with additional 30 mg as needed for breakthrough palpitations.   Atypical chest pain: Coronary CTA in 12/2019 showed no evidence of CAD with a coronary calcium score of 0.  She notes atypical chest pain that can be reproduced with palpation but also just feels overall sore, she generally associates more so with palpitations.  Reviewed coronary CTA results, through shared decision making agreed to defer on repeat coronary CTA at this time.  Diastolic dysfunction/Lower extremity edema: She had an echocardiogram in September 2016 in the setting of dyspnea that showed normal LV function with moderate diastolic dysfunction. A follow-up echocardiogram in September 2018 showed stable/normal LV function without mention of diastolic dysfunction.  Her most recent echocardiogram in 03/2020 showed EF 60 to 65% with normal diastolic parameters. Per Dr. Okey [name] she  does not have heart failure. She notes intermittent swelling today, not present on exam. Repeat echocardiogram. Continue Lasix 20 mg daily.         Dispo: Follow up with Dr. Okey [name] in three months or sooner if needed.   Signed, Rip Harbour, NP

## 2022-09-25 ENCOUNTER — Other Ambulatory Visit: Payer: Self-pay | Admitting: Internal Medicine

## 2022-09-25 DIAGNOSIS — R002 Palpitations: Secondary | ICD-10-CM | POA: Diagnosis not present

## 2022-10-03 ENCOUNTER — Encounter: Payer: Self-pay | Admitting: Podiatry

## 2022-10-03 ENCOUNTER — Ambulatory Visit: Payer: 59 | Admitting: Podiatry

## 2022-10-03 ENCOUNTER — Ambulatory Visit (INDEPENDENT_AMBULATORY_CARE_PROVIDER_SITE_OTHER): Payer: 59 | Admitting: Podiatry

## 2022-10-03 DIAGNOSIS — M25871 Other specified joint disorders, right ankle and foot: Secondary | ICD-10-CM | POA: Diagnosis not present

## 2022-10-03 MED ORDER — METHYLPREDNISOLONE 4 MG PO TBPK: ORAL_TABLET | ORAL | 0 refills | Status: DC

## 2022-10-03 NOTE — Progress Notes (Signed)
Chief Complaint  Patient presents with   Foot Pain    Patient is here for right foot pain from injury and joint pain of great right toe    HPI: 52 y.o. female presenting today for new complaint of pain and tenderness associated to the right forefoot ongoing for about 1-1/2 weeks now.  Idiopathic onset.  Patient does recall dropping a casserole dish on her right foot which healed uneventfully.  Other than that she cannot recall any trauma to the area.  She now has pain to the bottom aspect of the great toe joint.  Past Medical History:  Diagnosis Date   Anxiety 12/14/2011   Asthma    no inhaler   Cataract    Diastolic dysfunction    a. 10/2015 Echo: EF nl, mod diast dysfxn. Mild MR/TR; b. 09/2016 Echo: EF 55-60%, no rwma. Nl RV size and fxn; c. 03/2020 Echo: EF 60-65%. No rwma.   Factor 5 Leiden mutation, heterozygous (HCC)    Fibromyalgia    Fracture of 5th metatarsal 03/2011   left foot -     Heart palpitations    a. Notes intermittent tachycardia (sinus). Did not tolerate beta blockers 2/2 urinary incontinence; b. 07/2017 Event monitor: Avg HR 77 (49-136). No significant ectopy/pauses. Triggered events corresponded w/ sinus rhythm, sinus arrhythmias, and artifact.   Interstitial cystitis    Non-cardiac chest pain    a. 12/2015 CTA chest: no PE. No significant coronary Ca2+. Mosaic attenuation pattern in both lungs, may reflect small airway dzs; c. 12/2015 Ex MV: Walked 6 mins. No ischemia/scar; d. 12/2019 Cor CTA: Ca2+ = 0. Nl cors. ? PFO w/o evidence of flow (not seen on echo).   Ovarian cyst    Pancreatitis    Pelvic floor dysfunction    Rheumatoid arteritis (HCC)    Sinus infection    chronic   Syncope    a. 08/2017 Event monitor: Average heart rate 77 (49-136).  Periods of sinus arrhythmia noted.  Triggered events corresponded with sinus rhythm, sinus arrhythmia, and artifact.  No significant ectopy, sustained arrhythmias, or prolonged pauses observed.   Thrombosis    a.  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    Past Surgical History:  Procedure Laterality Date   ABDOMINAL HYSTERECTOMY     CHOLECYSTECTOMY     CYSTOSCOPY WITH HYDRODISTENSION AND BIOPSY  04/21/2011   Dr Lorenz Coaster   LAPAROSCOPIC ASSISTED VAGINAL HYSTERECTOMY  08/21/2011   Procedure: LAPAROSCOPIC ASSISTED VAGINAL HYSTERECTOMY;  Surgeon: Jeani Hawking, MD;  Location: [facility];  Service: Gynecology;  Laterality: N/A;  OPEN LAPAROSCOPIC   LAPAROSCOPIC NISSEN FUNDOPLICATION  2007   neck ablation Left 12/29/2019   neck ablation Right 01/05/2020   SALPINGOOPHORECTOMY  08/21/2011   Procedure: SALPINGO OOPHERECTOMY;  Surgeon: Jeani Hawking, MD;  Location: [facility];  Service: Gynecology;  Laterality: Bilateral;   TUBAL LIGATION  2010   WISDOM TOOTH EXTRACTION      Allergies  Allergen Reactions   Amitriptyline Other (See Comments)    Other reaction(s): Other  Change in mental status   Amoxicillin Rash   Bromelains     Other reaction(s): Abdominal pain   Celexa [Citalopram Hydrobromide] Other (See Comments)    Other reaction(s): Other Change in mental status   Ivp Dye [Iodinated Contrast Media] Anaphylaxis    Pt reports "I do not have contrast allergy and have received IVP dye  without pre-med with NO reaction."    Nortriptyline     Other reaction(s): Other   Penicillins Swelling and Rash   Pregabalin     Other reaction(s): Myalgias (muscle pain)   Bromelains [Pineapple Extract] Other (See Comments)    Severe pain   Budesonide-Formoterol Fumarate     Other reaction(s): Other   Citalopram Other (See Comments)   Mangifera Indica Swelling   Methylene Blue Other (See Comments)    Caused inflamed pancreas, per pt should not use   Sertraline Other (See Comments)    Other reaction(s): Other Suicidal thoughts   Sulfa Antibiotics Other (See Comments)   Sulfonamide Derivatives Other (See Comments)     excrutiating pain, patient states it caused her to have osteoarthritis   Xarelto [Rivaroxaban]    Bromaline [[employer] Di Bromm] Rash   Other Rash    Mango     Physical Exam: General: The patient is alert and oriented x3 in no acute distress.  Dermatology: Skin is warm, dry and supple bilateral lower extremities.   Vascular: Palpable pedal pulses bilaterally. Capillary refill within normal limits.  No appreciable edema.  No erythema.  Neurological: Grossly intact via light touch  Musculoskeletal Exam: No pedal deformities noted.  Tenderness palpation directly to the sesamoids of the right foot  Radiographic Exam RT foot 10/03/2022:  Normal osseous mineralization. Joint spaces preserved.  No fractures or osseous irregularities noted.  Impression: Negative  Assessment/Plan of Care: 1. Sesmoiditis right foot  -Patient evaluated. Xrays reviewed -Admits to walking around the house barefoot. Advise against. -Recommend good shoes and sandals even around the house.  -Rx medrol dosepak -No NSAIDs. GI sensitivity -RTC PRN       Felecia Shelling, DPM [facility]  Dr. Felecia Shelling, DPM    [address] [place], [place] [address]                Office [phone]  Fax [phone]

## 2022-10-11 ENCOUNTER — Ambulatory Visit: Payer: 59 | Admitting: Cardiology

## 2022-10-12 DIAGNOSIS — R002 Palpitations: Secondary | ICD-10-CM | POA: Diagnosis not present

## 2022-10-16 ENCOUNTER — Encounter: Payer: Self-pay | Admitting: Internal Medicine

## 2022-10-17 ENCOUNTER — Ambulatory Visit: Payer: 59 | Attending: Cardiology

## 2022-10-17 DIAGNOSIS — R600 Localized edema: Secondary | ICD-10-CM | POA: Diagnosis not present

## 2022-10-17 DIAGNOSIS — Z8679 Personal history of other diseases of the circulatory system: Secondary | ICD-10-CM

## 2022-10-17 DIAGNOSIS — I082 Rheumatic disorders of both aortic and tricuspid valves: Secondary | ICD-10-CM | POA: Diagnosis not present

## 2022-10-17 LAB — ECHOCARDIOGRAM COMPLETE
Area-P 1/2: 3.72 cm2
S' Lateral: 3.2 cm

## 2022-10-19 ENCOUNTER — Telehealth: Payer: Self-pay | Admitting: Internal Medicine

## 2022-10-19 NOTE — Telephone Encounter (Signed)
Spoke with [employer] and he was just calling to follow up on her diary entries. Zio log given to provider. No further needs.

## 2022-10-19 NOTE — Telephone Encounter (Signed)
[place] calling to speak with Desma Maxim. Please advise

## 2022-10-24 ENCOUNTER — Other Ambulatory Visit: Payer: Self-pay | Admitting: Internal Medicine

## 2022-10-30 ENCOUNTER — Telehealth: Payer: Self-pay | Admitting: Cardiology

## 2022-10-30 ENCOUNTER — Other Ambulatory Visit: Payer: Self-pay | Admitting: Internal Medicine

## 2022-10-30 NOTE — Telephone Encounter (Signed)
Please let April [name] know April [name] cardiac workup to date has been reassuring, there is no evidence of coronary artery disease on coronary CTA in 2021. Recent cardiac monitor and echocardiogram were without any significant findings. If she truly feels she is having a heart attack she should be evaluated in the ED. If she wishes to consider a second opinion she may do so, otherwise would recommend follow up with APP as scheduled or next available with Dr. Okey Dupre.

## 2022-10-30 NOTE — Telephone Encounter (Signed)
Pt calling for echo and heart monitor results

## 2022-10-31 NOTE — Telephone Encounter (Signed)
Left message to call back  

## 2022-11-02 NOTE — Telephone Encounter (Signed)
Attempted to contact pt, but received message that call could not be completed at this time. Will attempt again.

## 2022-11-03 NOTE — Telephone Encounter (Signed)
Attempted to contact pt, but now received message that # is not in service. Attempted to contact pt's husband, but phone just rings w/ no answer and no voicemail.

## 2022-11-07 ENCOUNTER — Other Ambulatory Visit: Payer: Self-pay | Admitting: Internal Medicine

## 2022-11-09 ENCOUNTER — Telehealth: Payer: Self-pay | Admitting: Podiatry

## 2022-11-09 ENCOUNTER — Other Ambulatory Visit: Payer: Self-pay | Admitting: Internal Medicine

## 2022-11-09 NOTE — Telephone Encounter (Signed)
Pt called and the area that [place]. She said it healed but now there are 3 spots that are red and painful. She said she thinks she feels something in it. She is asking if there is some kind of imaging that could see if anything is in her foot.  She also said her urologist warned her against taking to much antibiotics.

## 2022-11-10 ENCOUNTER — Ambulatory Visit: Payer: 59 | Admitting: Internal Medicine

## 2022-11-10 NOTE — Telephone Encounter (Signed)
Attempted to reach the patient. Was unable to leave a message 

## 2022-11-13 ENCOUNTER — Other Ambulatory Visit: Payer: Self-pay

## 2022-11-14 MED ORDER — AZELASTINE HCL 0.1 % NA SOLN: 2.0000 | Freq: Two times a day (BID) | NASAL | 3 refills | Status: DC

## 2022-11-15 NOTE — Progress Notes (Signed)
[facility] - Clinic Note  11/28/2022     CHIEF COMPLAINT Patient presents for Retina Follow Up    HISTORY OF PRESENT ILLNESS: April [name] is a 52 y.o. female who presents to the clinic today for:   HPI     Retina Follow Up   Patient presents with  Other.  In both eyes.  This started 1 year ago.  I, the attending physician,  performed the HPI with the patient and updated documentation appropriately.        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 Patient states vision doing ok. OD has reoccurring  shingles. Has pain right side of OD. Uses shingle medicine.      Last edited by Rennis Chris, MD on 11/29/2022 10:13 PM.    Pt states her vision is doing well, pt states she noticed last year after she got the dilation drops her heart started racing, she states the same thing happened when she got them this year as well, she states she has diastolic heart failure, she states she also has edema on her legs, she states she stopped taking Elmiron last year, but then had "major bladder issues", so she had to go back on it, pt states she recently had an outbreak of shingles, she states they were in her mouth, she is still taking Valtrex 1000mg  TID    Referring physician: Sherron Monday, MD [address] [place],  [place] [address]  HISTORICAL INFORMATION:   Selected notes from the MEDICAL RECORD NUMBER Retinal Eval - Referred by Dr. Marcelino Freestone Tejan-Sie Pt has been on urology med, [place], for over 5 yrs.  Eval for potential retinal damage.   CURRENT MEDICATIONS: No current outpatient medications on file. (Ophthalmic Drugs)   No current facility-administered medications for this visit. (Ophthalmic Drugs)   Current Outpatient Medications (Other)  Medication Sig   Acetaminophen (TYLENOL PO) Take by mouth as needed.   acyclovir ointment (ZOVIRAX) 5 % Apply thin layer to affected area 4-6 times per day   albuterol  (PROVENTIL HFA;VENTOLIN HFA) 108 (90 BASE) MCG/ACT inhaler Inhale 1 puff into the lungs as needed. For shortness of breath or wheezing   azelastine (ASTELIN) 0.1 % nasal spray Place 2 sprays into both nostrils 2 (two) times daily.   budesonide-formoterol (SYMBICORT) 80-4.5 MCG/ACT inhaler Inhale 2 puffs into the lungs.   Cholecalciferol (VITAMIN D3) 5000 units TABS Take by mouth daily.   Coenzyme Q10 (CO Q 10 PO) Take 200 mg by mouth daily.   [facility] EVENING PRIMROSE OIL PO Take by mouth 2 (two) times daily.   Cyanocobalamin (VITAMIN B-12) 5000 MCG SUBL Take 5,000 mcg by mouth daily.   cyclobenzaprine (FLEXERIL) 10 MG tablet TAKE 1 TABLET BY MOUTH 3 TIMES DAILY   diazepam (VALIUM) 10 MG tablet Take 10 mg by mouth daily.   dicyclomine (BENTYL) 20 MG tablet TAKE 1 TABLET BY MOUTH FOUR TIMES DAILY AS DIRECTED   diltiazem (CARDIZEM CD) 120 MG 24 hr capsule TAKE 1 CAPSULE BY MOUTH TWICE DAILY   diltiazem (CARDIZEM) 30 MG tablet TAKE 1 TABLET BY MOUTH EVERY 8 HOURS AS NEEDED FOR BREAKTHROUGH PALPITATIONS AS DIRECTED.   ELMIRON 100 MG capsule Take 200 mg by mouth 2 (two) times daily.   estradiol (ESTRACE) 0.1 MG/GM vaginal cream pea sized amount to external vagina 2 times weekly   fexofenadine (ALLEGRA) 180 MG tablet Take 180 mg by mouth every morning.  fluticasone (FLONASE) 50 MCG/ACT nasal spray USE 2 SPRAYS IN EACH NOSTRIL ONCE DAILY AS DIRECTED   fluticasone-salmeterol (ADVAIR) 250-50 MCG/ACT AEPB INHALE 1 PUFF TWICE DAILY AS DIRECTED. *RINSE MOUTH AFTER USE*   furosemide (LASIX) 20 MG tablet TAKE 1 TABLET BY MOUTH DAILY.   gabapentin (NEURONTIN) 800 MG tablet Take 800 mg by mouth 3 (three) times daily.   guaiFENesin (MUCINEX) 600 MG 12 hr tablet Take 1,200 mg by mouth 2 (two) times daily.   HYDROcodone-acetaminophen (NORCO/VICODIN) 5-325 MG tablet Take by mouth.   hydrOXYzine (ATARAX/VISTARIL) 50 MG tablet Take 50 mg by mouth at bedtime.   hydrOXYzine (VISTARIL) 25 MG capsule Take by mouth.    hyoscyamine (LEVSIN SL) 0.125 MG SL tablet DISSOLVE 1 TABLET UNDER TONGUE EVERY 4 HOURS AS NEEDED FOR CRAMPING   MAY USE BY MOUTH OR UNDER THE TONGUE   IBUPROFEN PO Take by mouth as needed.   Lactobacillus (ACIDOPHILUS PROBIOTIC PO) Take by mouth 3 (three) times daily.   lidocaine (XYLOCAINE) 5 % ointment Apply topically daily.   methocarbamol (ROBAXIN) 750 MG tablet Take 750 mg by mouth every 8 (eight) hours.   methylPREDNISolone (MEDROL DOSEPAK) 4 MG TBPK tablet 6 day dose pack - take as directed   montelukast (SINGULAIR) 10 MG tablet Take 10 mg by mouth at bedtime.   Multiple Vitamins-Minerals (MULTIVITAMIN ADULTS PO) Take 1 tablet by mouth daily.   naloxone (NARCAN) nasal spray 4 mg/0.1 mL Place into the nose.   nitrofurantoin (MACRODANTIN) 50 MG capsule Take 50 mg by mouth daily.   oxybutynin (DITROPAN) 5 MG tablet Take 5 mg by mouth 3 (three) times daily as needed.   pantoprazole (PROTONIX) 40 MG tablet Take 40 mg by mouth daily.   pregabalin (LYRICA) 150 MG capsule Take 150 mg by mouth.   PROCTOZONE-HC 2.5 % rectal cream Apply topically as needed.   pseudoephedrine (SUDAFED) 30 MG tablet Take 30 mg by mouth every 6 (six) hours as needed.   Simethicone 125 MG CAPS Take 125 mg by mouth 4 (four) times daily.   spironolactone (ALDACTONE) 50 MG tablet Take 50 mg by mouth once.   traMADol (ULTRAM) 50 MG tablet Take 1-2 tablets by mouth every 6 (six) hours as needed.   valACYclovir (VALTREX) 1000 MG tablet TAKE ONE TABLET (1000 MG) BY MOUTH TWICEDAILY   valACYclovir (VALTREX) 500 MG tablet Take 1 tablet (500 mg total) by mouth 2 (two) times daily.   Zinc 50 MG TABS Take 50 mg by mouth daily.   No current facility-administered medications for this visit. (Other)   REVIEW OF SYSTEMS: ROS   Positive for: Gastrointestinal, Neurological, Musculoskeletal, Cardiovascular, Eyes Negative for: Constitutional, Skin, Genitourinary, HENT, Endocrine, Respiratory, Psychiatric, Allergic/Imm,  Heme/Lymph Last edited by Laddie Aquas, COA on 11/28/2022  2:05 PM.      ALLERGIES Allergies  Allergen Reactions   Amitriptyline Other (See Comments)    Other reaction(s): Other  Change in mental status   Amoxicillin Rash   Bromelains     Other reaction(s): Abdominal pain   Celexa [Citalopram Hydrobromide] Other (See Comments)    Other reaction(s): Other Change in mental status   Ivp Dye [Iodinated Contrast Media] Anaphylaxis    Pt reports "I do not have contrast allergy and have received IVP dye without pre-med with NO reaction."    Nortriptyline     Other reaction(s): Other   Penicillins Swelling and Rash   Pregabalin     Other reaction(s): Myalgias (muscle pain)   Bromelains [Pineapple  Extract] Other (See Comments)    Severe pain   Budesonide-Formoterol Fumarate     Other reaction(s): Other   Citalopram Other (See Comments)   Mangifera Indica Swelling   Methylene Blue Other (See Comments)    Caused inflamed pancreas, per pt should not use   Sertraline Other (See Comments)    Other reaction(s): Other Suicidal thoughts   Sulfa Antibiotics Other (See Comments)   Sulfonamide Derivatives Other (See Comments)    excrutiating pain, patient states it caused her to have osteoarthritis   Xarelto [Rivaroxaban]    Bromaline [Al[employer] Di Bromm] Rash   Other Rash    Mango    PAST MEDICAL HISTORY Past Medical History:  Diagnosis Date   Anxiety 12/14/2011   Asthma    no inhaler   Cataract    Diastolic dysfunction    a. 10/2015 Echo: EF nl, mod diast dysfxn. Mild MR/TR; b. 09/2016 Echo: EF 55-60%, no rwma. Nl RV size and fxn; c. 03/2020 Echo: EF 60-65%. No rwma.   Factor 5 Leiden mutation, heterozygous (HCC)    Fibromyalgia    Fracture of 5th metatarsal 03/2011   left foot -     Heart palpitations    a. Notes intermittent tachycardia (sinus). Did not tolerate beta blockers 2/2 urinary incontinence; b. 07/2017 Event monitor: Avg HR 77 (49-136). No significant  ectopy/pauses. Triggered events corresponded w/ sinus rhythm, sinus arrhythmias, and artifact.   Interstitial cystitis    Non-cardiac chest pain    a. 12/2015 CTA chest: no PE. No significant coronary Ca2+. Mosaic attenuation pattern in both lungs, may reflect small airway dzs; c. 12/2015 Ex MV: Walked 6 mins. No ischemia/scar; d. 12/2019 Cor CTA: Ca2+ = 0. Nl cors. ? PFO w/o evidence of flow (not seen on echo).   Ovarian cyst    Pancreatitis    Pelvic floor dysfunction    Rheumatoid arteritis (HCC)    Sinus infection    chronic   Syncope    a. 08/2017 Event monitor: Average heart rate 77 (49-136).  Periods of sinus arrhythmia noted.  Triggered events corresponded with sinus rhythm, sinus arrhythmia, and artifact.  No significant ectopy, sustained arrhythmias, or prolonged pauses observed.   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   Past Surgical History:  Procedure Laterality Date   ABDOMINAL HYSTERECTOMY     CHOLECYSTECTOMY     CYSTOSCOPY WITH HYDRODISTENSION AND BIOPSY  04/21/2011   Dr Lorenz Coaster   LAPAROSCOPIC ASSISTED VAGINAL HYSTERECTOMY  08/21/2011   Procedure: LAPAROSCOPIC ASSISTED VAGINAL HYSTERECTOMY;  Surgeon: Jeani Hawking, MD;  Location: [facility];  Service: Gynecology;  Laterality: N/A;  OPEN LAPAROSCOPIC   LAPAROSCOPIC NISSEN FUNDOPLICATION  2007   neck ablation Left 12/29/2019   neck ablation Right 01/05/2020   SALPINGOOPHORECTOMY  08/21/2011   Procedure: SALPINGO OOPHERECTOMY;  Surgeon: Jeani Hawking, MD;  Location: [facility];  Service: Gynecology;  Laterality: Bilateral;   TUBAL LIGATION  2010   WISDOM TOOTH EXTRACTION      FAMILY HISTORY Family History  Problem Relation Age of Onset   Heart Problems Mother    Vascular Disease Mother    Skin cancer Mother    Vascular Disease Maternal Grandmother    Kidney Stones Father    Skin cancer Maternal Uncle     Anesthesia problems Neg Hx     SOCIAL HISTORY Social History  Tobacco Use   Smoking status: Never   Smokeless tobacco: Never  Vaping Use   Vaping status: Never Used  Substance Use Topics   Alcohol use: No   Drug use: No       OPHTHALMIC EXAM: Base Eye Exam     Visual Acuity (Snellen - Linear)       Right Left   Dist cc 20/20 -1 20/20    Correction: Glasses         Tonometry (Tonopen, 2:02 PM)       Right Left   Pressure 17 16         Pupils       Dark Light Shape React APD   Right 4 3 Round Brisk None   Left 4 3 Round Brisk None         Visual Fields (Counting fingers)       Left Right    Full Full         Extraocular Movement       Right Left    Full, Ortho Full, Ortho         Neuro/Psych     Oriented x3: Yes   Mood/Affect: Normal         Dilation     Both eyes: 1.0% Mydriacyl, 2.5% Phenylephrine @ 2:02 PM           Slit Lamp and Fundus Exam     Slit Lamp Exam       Right Left   Lids/Lashes Dermatochalasis - upper lid, mild MGD Dermatochalasis - upper lid   Conjunctiva/Sclera White and quiet White and quiet   Cornea tear film debris tear film debris   Anterior Chamber deep and clear Deep and quiet   Iris Round and dilated Round and dilated   Lens 2+ Nuclear sclerosis, 2+ Cortical cataract 2+ Nuclear sclerosis, 2+ Cortical cataract   Anterior Vitreous syneresis, Posterior vitreous detachment, vitreous condensations mild syneresis         Fundus Exam       Right Left   Disc Pink and Sharp, mild tilt, Compact, mild PPP Pink and Sharp, mild inferior PPP, Compact   C/D Ratio 0.2 0.2   Macula Flat, good foveal reflex, mild RPE mottling and clumping, No heme or edema Flat, good foveal reflex, mild Retinal pigment epithelial mottling and clumping, No heme or edema   Vessels attenuated, mild tortuosity attenuated, Tortuous, mild AV crossing changes   Periphery Attached, focal flame heme nasal to disc -- resolved, mild  inferior pavingstone degeneration, pigmented cystoid degeneration Attached, pigmented pavingstone degeneration inferiorly, No heme           Refraction     Wearing Rx       Sphere Cylinder Axis   Right -5.50 +0.50 169   Left -6.00 +0.75 025           IMAGING AND PROCEDURES  Imaging and Procedures for @TODAY @  OCT, Retina - OU - Both Eyes       Right Eye Quality was good. Central Foveal Thickness: 271. Progression has been stable. Findings include normal foveal contour, no IRF, no SRF (Normal ellipsoid signal, no toxicity).   Left Eye Quality was good. Central Foveal Thickness: 275. Progression has been stable. Findings include normal foveal contour, no IRF, no SRF, vitreomacular adhesion (Normal ellipsoid signal, no toxicity).   Notes *Images captured and stored on drive  Diagnosis / Impression:  NFP, no IRF/SRF OU  No pigmentary maculopathy or outer  retinal atrophy -- no Elmiron toxicity  Clinical management:  See below  Abbreviations: NFP - Normal foveal profile. CME - cystoid macular edema. PED - pigment epithelial detachment. IRF - intraretinal fluid. SRF - subretinal fluid. EZ - ellipsoid zone. ERM - epiretinal membrane. ORA - outer retinal atrophy. ORT - outer retinal tubulation. SRHM - subretinal hyper-reflective material            ASSESSMENT/PLAN:    ICD-10-CM   1. 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 Myopia of both eyes with astigmatism  H52.13    H52.203     3. Combined forms of age-related cataract of both eyes  H25.813      1. History of long-term Elmiron use -- no retinal toxicity or pigmentary retinopathy -- stable  - pt reports taking po Elmiron, 200 mg BID, since 2012  - pt reports that she temporarily stopped Elmiron since last year, but restarted due to recurrence of bladder issues  - OCT and autofluorescence without RPE atrophy  - BCVA remains 20/20 OU  - Optos FAF images obtained 11.1.22  - f/u 1  year, DFE, OCT, Optos FAF  2. Myopia w/ astigmatism OU  3. 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  - discussed diagnosis and progression  - not yet visually significant  - monitor  Ophthalmic Meds Ordered this visit:  No orders of the defined types were placed in this encounter.    Return in about 1 year (around 11/28/2023) for f/u pigmentary retinopathy OU -- Elmiron use , DFE, OCT.  There are no Patient Instructions on file for this visit.  This document serves as a record of services personally performed by Karie Chimera, MD, PhD. It was created on their behalf by Charlette Caffey, COT an ophthalmic technician. The creation of this record is the provider's dictation and/or activities during the visit.    Electronically signed by:  Charlette Caffey, COT  11/29/22 10:14 PM  This document serves as a record of services personally performed by Karie Chimera, MD, PhD. It was created on their behalf by Glee Arvin. Manson Passey, OA an ophthalmic technician. The creation of this record is the provider's dictation and/or activities during the visit.    Electronically signed by: Glee Arvin. Manson Passey, OA 11/29/22 10:14 PM  Karie Chimera, M.D., Ph.D. Diseases & Surgery of the Retina and Vitreous [facility]  I have reviewed the above documentation for accuracy and completeness, and I agree with the above. Karie Chimera, M.D., Ph.D. 11/29/22 10:16 PM   Abbreviations: M myopia (nearsighted); A astigmatism; H hyperopia (farsighted); P presbyopia; Mrx spectacle prescription;  CTL contact lenses; OD right eye; OS left eye; OU both eyes  XT exotropia; ET esotropia; PEK punctate epithelial keratitis; PEE punctate epithelial erosions; DES dry eye syndrome; MGD meibomian gland dysfunction; ATs artificial tears; PFAT's preservative free artificial tears; NSC nuclear sclerotic  cataract; PSC posterior subcapsular cataract; ERM epi-retinal membrane; PVD posterior vitreous detachment; RD retinal detachment; DM diabetes mellitus; DR diabetic retinopathy; NPDR non-proliferative diabetic retinopathy; PDR proliferative diabetic retinopathy; CSME clinically significant macular edema; DME diabetic macular edema; dbh dot blot hemorrhages; CWS cotton wool spot; POAG primary open angle glaucoma; C/D cup-to-disc ratio; HVF humphrey visual field; GVF goldmann visual field; OCT optical coherence tomography; IOP intraocular pressure; BRVO Branch retinal vein occlusion; CRVO central  retinal vein occlusion; CRAO central retinal artery occlusion; BRAO branch retinal artery occlusion; RT retinal tear; SB scleral buckle; PPV pars plana vitrectomy; VH Vitreous hemorrhage; PRP panretinal laser photocoagulation; IVK intravitreal kenalog; VMT vitreomacular traction; MH Macular hole;  NVD neovascularization of the disc; NVE neovascularization elsewhere; AREDS age related eye disease study; ARMD age related macular degeneration; POAG primary open angle glaucoma; EBMD epithelial/anterior basement membrane dystrophy; ACIOL anterior chamber intraocular lens; IOL intraocular lens; PCIOL posterior chamber intraocular lens; Phaco/IOL phacoemulsification with intraocular lens placement; PRK photorefractive keratectomy; LASIK laser assisted in situ keratomileusis; HTN hypertension; DM diabetes mellitus; COPD chronic obstructive pulmonary disease

## 2022-11-17 ENCOUNTER — Ambulatory Visit: Payer: 59 | Admitting: Internal Medicine

## 2022-11-23 ENCOUNTER — Other Ambulatory Visit: Payer: Self-pay | Admitting: Internal Medicine

## 2022-11-28 ENCOUNTER — Ambulatory Visit (INDEPENDENT_AMBULATORY_CARE_PROVIDER_SITE_OTHER): Payer: 59 | Admitting: Ophthalmology

## 2022-11-28 ENCOUNTER — Encounter (INDEPENDENT_AMBULATORY_CARE_PROVIDER_SITE_OTHER): Payer: Self-pay | Admitting: Ophthalmology

## 2022-11-28 DIAGNOSIS — H5213 Myopia, bilateral: Secondary | ICD-10-CM

## 2022-11-28 DIAGNOSIS — H3552 Pigmentary retinal dystrophy: Secondary | ICD-10-CM

## 2022-11-28 DIAGNOSIS — H25813 Combined forms of age-related cataract, bilateral: Secondary | ICD-10-CM | POA: Diagnosis not present

## 2022-11-29 ENCOUNTER — Encounter (INDEPENDENT_AMBULATORY_CARE_PROVIDER_SITE_OTHER): Payer: Self-pay | Admitting: Ophthalmology

## 2022-11-29 ENCOUNTER — Other Ambulatory Visit: Payer: 59

## 2022-11-29 ENCOUNTER — Other Ambulatory Visit: Payer: Self-pay

## 2022-11-29 DIAGNOSIS — R7303 Prediabetes: Secondary | ICD-10-CM

## 2022-11-29 DIAGNOSIS — E669 Obesity, unspecified: Secondary | ICD-10-CM

## 2022-11-30 ENCOUNTER — Telehealth: Payer: Self-pay | Admitting: Internal Medicine

## 2022-11-30 LAB — COMPREHENSIVE METABOLIC PANEL
ALT: 17 [IU]/L (ref 0–32)
AST: 16 [IU]/L (ref 0–40)
Albumin: 5.1 g/dL — ABNORMAL HIGH (ref 3.8–4.9)
Alkaline Phosphatase: 54 [IU]/L (ref 44–121)
BUN/Creatinine Ratio: 5 — ABNORMAL LOW (ref 9–23)
BUN: 4 mg/dL — ABNORMAL LOW (ref 6–24)
Bilirubin Total: 0.3 mg/dL (ref 0.0–1.2)
CO2: 22 mmol/L (ref 20–29)
Calcium: 9.6 mg/dL (ref 8.7–10.2)
Chloride: 99 mmol/L (ref 96–106)
Creatinine, Ser: 0.82 mg/dL (ref 0.57–1.00)
Globulin, Total: 2.4 g/dL (ref 1.5–4.5)
Glucose: 96 mg/dL (ref 70–99)
Potassium: 4.3 mmol/L (ref 3.5–5.2)
Sodium: 139 mmol/L (ref 134–144)
Total Protein: 7.5 g/dL (ref 6.0–8.5)
eGFR: 87 mL/min/{1.73_m2} (ref >59)

## 2022-11-30 LAB — HEMOGLOBIN A1C
Est. average glucose Bld gHb Est-mCnc: 117 mg/dL
Hgb A1c MFr Bld: 5.7 % — ABNORMAL HIGH (ref 4.8–5.6)

## 2022-11-30 LAB — LIPID PANEL
Chol/HDL Ratio: 2.2 ratio (ref 0.0–4.4)
Cholesterol, Total: 190 mg/dL (ref 100–199)
HDL: 86 mg/dL (ref >39)
LDL Chol Calc (NIH): 91 mg/dL (ref 0–99)
Triglycerides: 73 mg/dL (ref 0–149)
VLDL Cholesterol Cal: 13 mg/dL (ref 5–40)

## 2022-11-30 NOTE — Telephone Encounter (Signed)
Returned the call to the patient. She stated that last night at church she had an episode where she suddenly did not feel well. She stated that she felt like she was "disintegrating" and she passed out. She stated that she feels better today. She was not harmed when she passed out. She was unable to check her blood pressure and heart rate due to being at church.   She stated that she normally wears compression stockings and she did not have them on last night. She stated that she stays hydrated. She has felt light headed before but has not passed out.  She feels like it may be cardiac related.   Patient made aware of ED precautions should new or worsening symptoms occur. Patient verbalized understanding.   She has a follow up with APP on 11/26. She sees PCP on 11/4.

## 2022-11-30 NOTE — Telephone Encounter (Signed)
Pt states that she wanted to tell the nurse she had a episode last night. Please advise

## 2022-12-01 ENCOUNTER — Telehealth: Payer: Self-pay | Admitting: Internal Medicine

## 2022-12-01 NOTE — Telephone Encounter (Signed)
Attempted to reach the patient. Was unable to leave a message.  End, Cristal Deer, MD  You; Parke Poisson, RN32 minutes ago (9:43 AM)    I agree with your recommendations.  She has a long list of complaints with extensive cardiac workup over the course of several years being unremarkable.  Follow-up with [place] and PCP as scheduled is appropriate.  Thanks.

## 2022-12-01 NOTE — Telephone Encounter (Signed)
Patient left VM that she had a spell of blacking out at church the other night and once it was over she felt better. She thinks this may be related to her heart. Said she will discuss at appt on 11/4. Just FYI.

## 2022-12-04 ENCOUNTER — Ambulatory Visit (INDEPENDENT_AMBULATORY_CARE_PROVIDER_SITE_OTHER): Payer: 59 | Admitting: Internal Medicine

## 2022-12-04 VITALS — BP 152/94 | HR 84 | Ht 65.0 in | Wt 198.0 lb

## 2022-12-04 DIAGNOSIS — K588 Other irritable bowel syndrome: Secondary | ICD-10-CM | POA: Diagnosis not present

## 2022-12-04 DIAGNOSIS — I10 Essential (primary) hypertension: Secondary | ICD-10-CM

## 2022-12-04 DIAGNOSIS — R55 Syncope and collapse: Secondary | ICD-10-CM

## 2022-12-04 DIAGNOSIS — B029 Zoster without complications: Secondary | ICD-10-CM | POA: Diagnosis not present

## 2022-12-04 MED ORDER — VALACYCLOVIR HCL 500 MG PO TABS: 500.0000 mg | ORAL_TABLET | Freq: Two times a day (BID) | ORAL | 2 refills | Status: DC

## 2022-12-04 MED ORDER — CYCLOBENZAPRINE HCL 10 MG PO TABS: 10.0000 mg | ORAL_TABLET | Freq: Three times a day (TID) | ORAL | 1 refills | Status: DC

## 2022-12-04 MED ORDER — DICYCLOMINE HCL 20 MG PO TABS: ORAL_TABLET | ORAL | 3 refills | Status: DC

## 2022-12-04 NOTE — Progress Notes (Unsigned)
Established Patient Office Visit  Subjective:  Patient ID: April [name], female    DOB: 08/10/1970  Age: 52 y.o. MRN: [id number]  Chief Complaint  Patient presents with  . Follow-up    3 Months Follow Up    C/o blackout 6 days ago while at [facility], she was seated at the time and just suddenly lost consciousness for a few minutes, with post ictal confusion but no tonic clonic movements, tongue biting or sphincteric incontinence. Was not taken to the ER however.   No other concerns at this time.   Past Medical History:  Diagnosis Date  . Anxiety 12/14/2011  . Asthma    no inhaler  . Cataract   . Diastolic dysfunction    a. 10/2015 Echo: EF nl, mod diast dysfxn. Mild MR/TR; b. 09/2016 Echo: EF 55-60%, no rwma. Nl RV size and fxn; c. 03/2020 Echo: EF 60-65%. No rwma.  . Factor 5 Leiden mutation, heterozygous (HCC)   . Fibromyalgia   . Fracture of 5th metatarsal 03/2011   left foot -    . Heart palpitations    a. Notes intermittent tachycardia (sinus). Did not tolerate beta blockers 2/2 urinary incontinence; b. 07/2017 Event monitor: Avg HR 77 (49-136). No significant ectopy/pauses. Triggered events corresponded w/ sinus rhythm, sinus arrhythmias, and artifact.  . Interstitial cystitis   . Non-cardiac chest pain    a. 12/2015 CTA chest: no PE. No significant coronary Ca2+. Mosaic attenuation pattern in both lungs, may reflect small airway dzs; c. 12/2015 Ex MV: Walked 6 mins. No ischemia/scar; d. 12/2019 Cor CTA: Ca2+ = 0. Nl cors. ? PFO w/o evidence of flow (not seen on echo).  . Ovarian cyst   . Pancreatitis   . Pelvic floor dysfunction   . Rheumatoid arteritis (HCC)   . Sinus infection    chronic  . Syncope    a. 08/2017 Event monitor: Average heart rate 77 (49-136).  Periods of sinus arrhythmia noted.  Triggered events corresponded with sinus rhythm, sinus arrhythmia, and artifact.  No significant ectopy, sustained arrhythmias, or prolonged pauses observed.  . Thrombosis    a.  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    Past Surgical History:  Procedure Laterality Date  . ABDOMINAL HYSTERECTOMY    . CHOLECYSTECTOMY    . CYSTOSCOPY WITH HYDRODISTENSION AND BIOPSY  04/21/2011   Dr Lorenz Coaster  . LAPAROSCOPIC ASSISTED VAGINAL HYSTERECTOMY  08/21/2011   Procedure: LAPAROSCOPIC ASSISTED VAGINAL HYSTERECTOMY;  Surgeon: Jeani Hawking, MD;  Location: [facility];  Service: Gynecology;  Laterality: N/A;  OPEN LAPAROSCOPIC  . LAPAROSCOPIC NISSEN FUNDOPLICATION  2007  . neck ablation Left 12/29/2019  . neck ablation Right 01/05/2020  . SALPINGOOPHORECTOMY  08/21/2011   Procedure: SALPINGO OOPHERECTOMY;  Surgeon: Jeani Hawking, MD;  Location: [facility];  Service: Gynecology;  Laterality: Bilateral;  . TUBAL LIGATION  2010  . WISDOM TOOTH EXTRACTION      Social History   Socioeconomic History  . Marital status: Married    Spouse name: Not on file  . Number of children: Not on file  . Years of education: Not on file  . Highest education level: Not on file  Occupational History  . Not on file  Tobacco Use  . Smoking status: Never  . Smokeless tobacco: Never  Vaping Use  . Vaping status: Never Used  Substance and Sexual Activity  . Alcohol  use: No  . Drug use: No  . Sexual activity: Yes    Birth control/protection: Surgical  Other Topics Concern  . Not on file  Social History Narrative  . Not on file   Social Determinants of Health   Financial Resource Strain: Not on file  Food Insecurity: Not on file  Transportation Needs: Not on file  Physical Activity: Not on file  Stress: Not on file  Social Connections: Unknown (05/19/2022)   [facility] from [facility], [facility]   Social Network   . Social Network: Not on file  Intimate Partner Violence: Unknown (05/19/2022)   [facility], [facility]   HITS   . Physically Hurt: Not on file   . Insult  or Talk Down To: Not on file   . Threaten Physical Harm: Not on file   . Scream or Curse: Not on file    Family History  Problem Relation Age of Onset  . Heart Problems Mother   . Vascular Disease Mother   . Skin cancer Mother   . Vascular Disease Maternal Grandmother   . Kidney Stones Father   . Skin cancer Maternal Uncle   . Anesthesia problems Neg Hx     Allergies  Allergen Reactions  . Amitriptyline Other (See Comments)    Other reaction(Leahanna Buser): Other  Change in mental status  . Amoxicillin Rash  . Bromelains     Other reaction(Meri Pelot): Abdominal pain  . Celexa [Citalopram Hydrobromide] Other (See Comments)    Other reaction(Mikita Lesmeister): Other Change in mental status  . Ivp Dye [Iodinated Contrast Media] Anaphylaxis    Pt reports "I do not have contrast allergy and have received IVP dye without pre-med with NO reaction."   . Nortriptyline     Other reaction(Jaquavious Mercer): Other  . Penicillins Swelling and Rash  . Pregabalin     Other reaction(Alyene Predmore): Myalgias (muscle pain)  . Bromelains [Pineapple Extract] Other (See Comments)    Severe pain  . Budesonide-Formoterol Fumarate     Other reaction(Eian Vandervelden): Other  . Citalopram Other (See Comments)  . Mangifera Indica Swelling  . Methylene Blue Other (See Comments)    Caused inflamed pancreas, per pt should not use  . Sertraline Other (See Comments)    Other reaction(Baleria Wyman): Other Suicidal thoughts  . Sulfa Antibiotics Other (See Comments)  . Sulfonamide Derivatives Other (See Comments)    excrutiating pain, patient states it caused her to have osteoarthritis  . Xarelto [Rivaroxaban]   . Bromaline [Albertsons Di Bromm] Rash  . Other Rash    Mango    Review of Systems  Constitutional: Negative.   HENT: Negative.    Eyes: Negative.   Respiratory: Negative.    Cardiovascular: Negative.   Gastrointestinal: Negative.   Genitourinary: Negative.   Skin: Negative.   Neurological: Negative.   Endo/Heme/Allergies: Negative.        Objective:   BP  (!) 152/94   Pulse 84   Ht 5\' 5"  (1.651 m)   Wt 198 lb (89.8 kg)   LMP 05/20/2011   SpO2 95%   BMI 32.95 kg/m   Vitals:   12/04/22 1545  BP: (!) 152/94  Pulse: 84  Height: 5\' 5"  (1.651 m)  Weight: 198 lb (89.8 kg)  SpO2: 95%  BMI (Calculated): 32.95    Physical Exam Vitals reviewed.  Constitutional:      General: She is not in acute distress. HENT:     Head: Normocephalic.     Nose: Nose normal.  Mouth/Throat:     Mouth: Mucous membranes are moist.  Eyes:     Extraocular Movements: Extraocular movements intact.     Pupils: Pupils are equal, round, and reactive to light.  Neck:     Vascular: No carotid bruit.  Cardiovascular:     Rate and Rhythm: Normal rate and regular rhythm.     Heart sounds: No murmur heard. Pulmonary:     Effort: Pulmonary effort is normal.     Breath sounds: No rhonchi or rales.  Abdominal:     General: Bowel sounds are normal.     Palpations: Abdomen is soft. There is no hepatomegaly, splenomegaly or mass.     Comments: Stimulator in right gluteus  Musculoskeletal:        General: Normal range of motion.     Cervical back: Normal range of motion. No tenderness.  Skin:    General: Skin is warm and dry.  Neurological:     General: No focal deficit present.     Mental Status: She is alert and oriented to person, place, and time.     Cranial Nerves: No cranial nerve deficit.     Motor: No weakness.  Psychiatric:        Mood and Affect: Mood normal.        Behavior: Behavior normal.     No results found for any visits on 12/04/22.  Recent Results (from the past 2160 hour(Keriana Sarsfield))  ECHOCARDIOGRAM COMPLETE     Status: None   Collection Time: 10/17/22  2:27 PM  Result Value Ref Range   Kamela Blansett' Lateral 3.20 cm   Area-P 1/2 3.72 cm2   Est EF 60 - 65%   Comprehensive metabolic panel     Status: Abnormal   Collection Time: 11/29/22  9:21 AM  Result Value Ref Range   Glucose 96 70 - 99 mg/dL   BUN 4 (L) 6 - 24 mg/dL   Creatinine, Ser 4.33  0.57 - 1.00 mg/dL   eGFR 87 >29 JJ/OAC/1.66   BUN/Creatinine Ratio 5 (L) 9 - 23   Sodium 139 134 - 144 mmol/L   Potassium 4.3 3.5 - 5.2 mmol/L   Chloride 99 96 - 106 mmol/L   CO2 22 20 - 29 mmol/L   Calcium 9.6 8.7 - 10.2 mg/dL   Total Protein 7.5 6.0 - 8.5 g/dL   Albumin 5.1 (H) 3.8 - 4.9 g/dL   Globulin, Total 2.4 1.5 - 4.5 g/dL   Bilirubin Total 0.3 0.0 - 1.2 mg/dL   Alkaline Phosphatase 54 44 - 121 IU/L   AST 16 0 - 40 IU/L   ALT 17 0 - 32 IU/L  Lipid panel     Status: None   Collection Time: 11/29/22  9:21 AM  Result Value Ref Range   Cholesterol, Total 190 100 - 199 mg/dL   Triglycerides 73 0 - 149 mg/dL   HDL 86 >06 mg/dL   VLDL Cholesterol Cal 13 5 - 40 mg/dL   LDL Chol Calc (NIH) 91 0 - 99 mg/dL   Chol/HDL Ratio 2.2 0.0 - 4.4 ratio    Comment:                                   T. Chol/HDL Ratio  Men  Women                               1/2 Avg.Risk  3.4    3.3                                   Avg.Risk  5.0    4.4                                2X Avg.Risk  9.6    7.1                                3X Avg.Risk 23.4   11.0   Hemoglobin A1c     Status: Abnormal   Collection Time: 11/29/22  9:21 AM  Result Value Ref Range   Hgb A1c MFr Bld 5.7 (H) 4.8 - 5.6 %    Comment:          Prediabetes: 5.7 - 6.4          Diabetes: >6.4          Glycemic control for adults with diabetes: <7.0    Est. average glucose Bld gHb Est-mCnc 117 mg/dL      Assessment & Plan:  As per problem list  Problem List Items Addressed This Visit       Other   Herpes zoster without complication   Relevant Medications   valACYclovir (VALTREX) 500 MG tablet   Other Visit Diagnoses     Syncope and collapse    -  Primary   Relevant Orders   US Carotid Duplex Bilateral   CT HEAD WO CONTRAST ( )   CBC With Diff/Platelet   Other irritable bowel syndrome       Relevant Medications   dicyclomine (BENTYL) 20 MG tablet       Return in about  2 weeks (around 12/18/2022) for Imaging results.   Total time spent: 20 minutes  Luna Fuse, MD  12/04/2022   This document may have been prepared by [facility] Voice Recognition software and as such may include unintentional dictation errors.

## 2022-12-05 ENCOUNTER — Ambulatory Visit (INDEPENDENT_AMBULATORY_CARE_PROVIDER_SITE_OTHER): Payer: 59

## 2022-12-05 DIAGNOSIS — R55 Syncope and collapse: Secondary | ICD-10-CM

## 2022-12-05 LAB — CBC WITH DIFF/PLATELET
Basophils Absolute: 0.1 10*3/uL (ref 0.0–0.2)
Basos: 1 %
EOS (ABSOLUTE): 0.1 10*3/uL (ref 0.0–0.4)
Eos: 2 %
Hematocrit: 38.8 % (ref 34.0–46.6)
Hemoglobin: 13.3 g/dL (ref 11.1–15.9)
Immature Grans (Abs): 0 10*3/uL (ref 0.0–0.1)
Immature Granulocytes: 0 %
Lymphocytes Absolute: 2.8 10*3/uL (ref 0.7–3.1)
Lymphs: 41 %
MCH: 32.8 pg (ref 26.6–33.0)
MCHC: 34.3 g/dL (ref 31.5–35.7)
MCV: 96 fL (ref 79–97)
Monocytes Absolute: 0.6 10*3/uL (ref 0.1–0.9)
Monocytes: 10 %
Neutrophils Absolute: 3.2 10*3/uL (ref 1.4–7.0)
Neutrophils: 46 %
Platelets: 313 10*3/uL (ref 150–450)
RBC: 4.06 x10E6/uL (ref 3.77–5.28)
RDW: 13.9 % (ref 11.7–15.4)
WBC: 6.8 10*3/uL (ref 3.4–10.8)

## 2022-12-05 NOTE — Telephone Encounter (Signed)
The patient was seen by their PCP on 12/04/22.

## 2022-12-06 ENCOUNTER — Ambulatory Visit (INDEPENDENT_AMBULATORY_CARE_PROVIDER_SITE_OTHER): Payer: 59

## 2022-12-06 DIAGNOSIS — R55 Syncope and collapse: Secondary | ICD-10-CM | POA: Diagnosis not present

## 2022-12-06 NOTE — Progress Notes (Signed)
Patient notified

## 2022-12-14 DIAGNOSIS — N390 Urinary tract infection, site not specified: Secondary | ICD-10-CM | POA: Diagnosis not present

## 2022-12-19 ENCOUNTER — Ambulatory Visit (INDEPENDENT_AMBULATORY_CARE_PROVIDER_SITE_OTHER): Payer: 59 | Admitting: Internal Medicine

## 2022-12-19 ENCOUNTER — Encounter: Payer: Self-pay | Admitting: Internal Medicine

## 2022-12-19 VITALS — BP 134/86 | HR 93 | Ht 65.0 in | Wt 202.6 lb

## 2022-12-19 DIAGNOSIS — R55 Syncope and collapse: Secondary | ICD-10-CM

## 2022-12-19 DIAGNOSIS — E669 Obesity, unspecified: Secondary | ICD-10-CM | POA: Diagnosis not present

## 2022-12-19 NOTE — Progress Notes (Signed)
Established Patient Office Visit  Subjective:  Patient ID: April [name], female    DOB: 12-14-1970  Age: 52 y.o. MRN: [id number]  Chief Complaint  Patient presents with   Follow-up    Discuss CT results and carotid duplex    No new complaints, here for results follow up for syncope. Carotid USS normal, prior head CT results reported with unremarkable labs. Scheduled to see Cardiology later this week.     No other concerns at this time.   Past Medical History:  Diagnosis Date   Anxiety 12/14/2011   Asthma    no inhaler   Cataract    Diastolic dysfunction    a. 10/2015 Echo: EF nl, mod diast dysfxn. Mild MR/TR; b. 09/2016 Echo: EF 55-60%, no rwma. Nl RV size and fxn; c. 03/2020 Echo: EF 60-65%. No rwma.   Factor 5 Leiden mutation, heterozygous (HCC)    Fibromyalgia    Fracture of 5th metatarsal 03/2011   left foot -     Heart palpitations    a. Notes intermittent tachycardia (sinus). Did not tolerate beta blockers 2/2 urinary incontinence; b. 07/2017 Event monitor: Avg HR 77 (49-136). No significant ectopy/pauses. Triggered events corresponded w/ sinus rhythm, sinus arrhythmias, and artifact.   Interstitial cystitis    Non-cardiac chest pain    a. 12/2015 CTA chest: no PE. No significant coronary Ca2+. Mosaic attenuation pattern in both lungs, may reflect small airway dzs; c. 12/2015 Ex MV: Walked 6 mins. No ischemia/scar; d. 12/2019 Cor CTA: Ca2+ = 0. Nl cors. ? PFO w/o evidence of flow (not seen on echo).   Ovarian cyst    Pancreatitis    Pelvic floor dysfunction    Rheumatoid arteritis (HCC)    Sinus infection    chronic   Syncope    a. 08/2017 Event monitor: Average heart rate 77 (49-136).  Periods of sinus arrhythmia noted.  Triggered events corresponded with sinus rhythm, sinus arrhythmia, and artifact.  No significant ectopy, sustained arrhythmias, or prolonged pauses observed.   Thrombosis    a. 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    Past Surgical History:  Procedure Laterality Date   ABDOMINAL HYSTERECTOMY     CHOLECYSTECTOMY     CYSTOSCOPY WITH HYDRODISTENSION AND BIOPSY  04/21/2011   Dr Lorenz Coaster   LAPAROSCOPIC ASSISTED VAGINAL HYSTERECTOMY  08/21/2011   Procedure: LAPAROSCOPIC ASSISTED VAGINAL HYSTERECTOMY;  Surgeon: Jeani Hawking, MD;  Location: [facility];  Service: Gynecology;  Laterality: N/A;  OPEN LAPAROSCOPIC   LAPAROSCOPIC NISSEN FUNDOPLICATION  2007   neck ablation Left 12/29/2019   neck ablation Right 01/05/2020   SALPINGOOPHORECTOMY  08/21/2011   Procedure: SALPINGO OOPHERECTOMY;  Surgeon: Jeani Hawking, MD;  Location: [facility];  Service: Gynecology;  Laterality: Bilateral;   TUBAL LIGATION  2010   WISDOM TOOTH EXTRACTION      Social History   Socioeconomic History   Marital status: Married    Spouse name: Not on file   Number of children: Not on file   Years of education: Not on file   Highest education level: Not on file  Occupational History   Not on file  Tobacco Use   Smoking status: Never   Smokeless tobacco: Never  Vaping Use   Vaping status: Never Used  Substance and Sexual Activity   Alcohol use: No   Drug use: No   Sexual activity: Yes  Birth control/protection: Surgical  Other Topics Concern   Not on file  Social History Narrative   Not on file   Social Determinants of Health   Financial Resource Strain: Not on file  Food Insecurity: Not on file  Transportation Needs: Not on file  Physical Activity: Not on file  Stress: Not on file  Social Connections: Unknown (05/19/2022)   [facility]   Social Network    Social Network: Not on file  Intimate Partner Violence: Unknown (05/19/2022)   Received from [facility], [facility]   HITS    Physically Hurt: Not on file    Insult or Talk Down To: Not on file    Threaten Physical Harm: Not on file    Scream or Curse: Not on  file    Family History  Problem Relation Age of Onset   Heart Problems Mother    Vascular Disease Mother    Skin cancer Mother    Vascular Disease Maternal Grandmother    Kidney Stones Father    Skin cancer Maternal Uncle    Anesthesia problems Neg Hx     Allergies  Allergen Reactions   Amitriptyline Other (See Comments)    Other reaction(Brydon Spahr): Other  Change in mental status   Amoxicillin Rash   Bromelains     Other reaction(Amybeth Sieg): Abdominal pain   Celexa [Citalopram Hydrobromide] Other (See Comments)    Other reaction(Tajha Sammarco): Other Change in mental status   Ivp Dye [Iodinated Contrast Media] Anaphylaxis    Pt reports "I do not have contrast allergy and have received IVP dye without pre-med with NO reaction."    Nortriptyline     Other reaction(Milus Fritze): Other   Penicillins Swelling and Rash   Pregabalin     Other reaction(Mecca Barga): Myalgias (muscle pain)   Bromelains [Pineapple Extract] Other (See Comments)    Severe pain   Budesonide-Formoterol Fumarate     Other reaction(Mikaeel Petrow): Other   Citalopram Other (See Comments)   Mangifera Indica Swelling   Methylene Blue Other (See Comments)    Caused inflamed pancreas, per pt should not use   Sertraline Other (See Comments)    Other reaction(Cozy Veale): Other Suicidal thoughts   Sulfa Antibiotics Other (See Comments)   Sulfonamide Derivatives Other (See Comments)    excrutiating pain, patient states it caused her to have osteoarthritis   Xarelto [Rivaroxaban]    Bromaline [[employer] Di Bromm] Rash   Other Rash    Mango    Outpatient Medications Prior to Visit  Medication Sig   Acetaminophen (TYLENOL PO) Take by mouth as needed.   acyclovir ointment (ZOVIRAX) 5 % Apply thin layer to affected area 4-6 times per day   albuterol (PROVENTIL HFA;VENTOLIN HFA) 108 (90 BASE) MCG/ACT inhaler Inhale 1 puff into the lungs as needed. For shortness of breath or wheezing   azelastine (ASTELIN) 0.1 % nasal spray Place 2 sprays into both nostrils 2 (two) times  daily.   budesonide-formoterol (SYMBICORT) 80-4.5 MCG/ACT inhaler Inhale 2 puffs into the lungs.   Cholecalciferol (VITAMIN D3) 5000 units TABS Take by mouth daily.   Coenzyme Q10 (CO Q 10 PO) Take 200 mg by mouth daily.   [employer] EVENING PRIMROSE OIL PO Take by mouth 2 (two) times daily.   Cyanocobalamin (VITAMIN B-12) 5000 MCG SUBL Take 5,000 mcg by mouth daily.   cyclobenzaprine (FLEXERIL) 10 MG tablet Take 1 tablet (10 mg total) by mouth 3 (three) times daily.   diazepam (VALIUM) 10 MG tablet Take 10  mg by mouth daily.   dicyclomine (BENTYL) 20 MG tablet TAKE 1 TABLET BY MOUTH FOUR TIMES DAILY AS DIRECTED   diltiazem (CARDIZEM CD) 120 MG 24 hr capsule TAKE 1 CAPSULE BY MOUTH TWICE DAILY   diltiazem (CARDIZEM) 30 MG tablet TAKE 1 TABLET BY MOUTH EVERY 8 HOURS AS NEEDED FOR BREAKTHROUGH PALPITATIONS AS DIRECTED.   ELMIRON 100 MG capsule Take 200 mg by mouth 2 (two) times daily.   estradiol (ESTRACE) 0.1 MG/GM vaginal cream pea sized amount to external vagina 2 times weekly   fexofenadine (ALLEGRA) 180 MG tablet Take 180 mg by mouth every morning.   fluticasone (FLONASE) 50 MCG/ACT nasal spray USE 2 SPRAYS IN EACH NOSTRIL ONCE DAILY AS DIRECTED   fluticasone-salmeterol (ADVAIR) 250-50 MCG/ACT AEPB INHALE 1 PUFF TWICE DAILY AS DIRECTED. *RINSE MOUTH AFTER USE*   furosemide (LASIX) 20 MG tablet TAKE 1 TABLET BY MOUTH DAILY.   gabapentin (NEURONTIN) 800 MG tablet Take 800 mg by mouth 3 (three) times daily.   HYDROcodone-acetaminophen (NORCO/VICODIN) 5-325 MG tablet Take by mouth.   hydrOXYzine (ATARAX/VISTARIL) 50 MG tablet Take 50 mg by mouth at bedtime.   hyoscyamine (LEVSIN SL) 0.125 MG SL tablet DISSOLVE 1 TABLET UNDER TONGUE EVERY 4 HOURS AS NEEDED FOR CRAMPING   MAY USE BY MOUTH OR UNDER THE TONGUE   IBUPROFEN PO Take by mouth as needed.   Lactobacillus (ACIDOPHILUS PROBIOTIC PO) Take by mouth 3 (three) times daily.   lidocaine (XYLOCAINE) 5 % ointment Apply topically daily.   methocarbamol  (ROBAXIN) 750 MG tablet Take 750 mg by mouth every 8 (eight) hours.   methylPREDNISolone (MEDROL DOSEPAK) 4 MG TBPK tablet 6 day dose pack - take as directed   montelukast (SINGULAIR) 10 MG tablet Take 10 mg by mouth at bedtime.   Multiple Vitamins-Minerals (MULTIVITAMIN ADULTS PO) Take 1 tablet by mouth daily.   nitrofurantoin (MACRODANTIN) 50 MG capsule Take 50 mg by mouth daily.   oxybutynin (DITROPAN) 5 MG tablet Take 5 mg by mouth 3 (three) times daily as needed.   pantoprazole (PROTONIX) 40 MG tablet Take 40 mg by mouth daily.   pregabalin (LYRICA) 150 MG capsule Take 150 mg by mouth.   PROCTOZONE-HC 2.5 % rectal cream Apply topically as needed.   pseudoephedrine (SUDAFED) 30 MG tablet Take 30 mg by mouth every 6 (six) hours as needed.   Simethicone 125 MG CAPS Take 125 mg by mouth 4 (four) times daily.   spironolactone (ALDACTONE) 50 MG tablet Take 50 mg by mouth once.   traMADol (ULTRAM) 50 MG tablet Take 1-2 tablets by mouth every 6 (six) hours as needed.   valACYclovir (VALTREX) 1000 MG tablet TAKE ONE TABLET (1000 MG) BY MOUTH TWICEDAILY   valACYclovir (VALTREX) 500 MG tablet Take 1 tablet (500 mg total) by mouth 2 (two) times daily.   Zinc 50 MG TABS Take 50 mg by mouth daily.   guaiFENesin (MUCINEX) 600 MG 12 hr tablet Take 1,200 mg by mouth 2 (two) times daily. (Patient not taking: Reported on 12/19/2022)   hydrOXYzine (VISTARIL) 25 MG capsule Take by mouth. (Patient not taking: Reported on 12/19/2022)   naloxone (NARCAN) nasal spray 4 mg/0.1 mL Place into the nose. (Patient not taking: Reported on 12/19/2022)   No facility-administered medications prior to visit.    Review of Systems  Constitutional: Negative.   HENT: Negative.    Eyes: Negative.   Respiratory: Negative.    Cardiovascular: Negative.   Gastrointestinal: Negative.   Genitourinary: Negative.  Skin: Negative.   Neurological: Negative.   Endo/Heme/Allergies: Negative.        Objective:   BP 134/86    Pulse 93   Ht 5\' 5"  (1.651 m)   Wt 202 lb 9.6 oz (91.9 kg)   LMP 05/20/2011   SpO2 99%   BMI 33.71 kg/m   Vitals:   12/19/22 1522  BP: 134/86  Pulse: 93  Height: 5\' 5"  (1.651 m)  Weight: 202 lb 9.6 oz (91.9 kg)  SpO2: 99%  BMI (Calculated): 33.71    Physical Exam Vitals reviewed.  Constitutional:      General: She is not in acute distress. HENT:     Head: Normocephalic.     Nose: Nose normal.     Mouth/Throat:     Mouth: Mucous membranes are moist.  Eyes:     Extraocular Movements: Extraocular movements intact.     Pupils: Pupils are equal, round, and reactive to light.  Neck:     Vascular: No carotid bruit.  Cardiovascular:     Rate and Rhythm: Normal rate and regular rhythm.     Heart sounds: No murmur heard. Pulmonary:     Effort: Pulmonary effort is normal.     Breath sounds: No rhonchi or rales.  Abdominal:     General: Bowel sounds are normal.     Palpations: Abdomen is soft. There is no hepatomegaly, splenomegaly or mass.     Comments: Stimulator in right gluteus  Musculoskeletal:        General: Normal range of motion.     Cervical back: Normal range of motion. No tenderness.  Skin:    General: Skin is warm and dry.  Neurological:     General: No focal deficit present.     Mental Status: She is alert and oriented to person, place, and time.     Cranial Nerves: No cranial nerve deficit.     Motor: No weakness.  Psychiatric:        Mood and Affect: Mood normal.        Behavior: Behavior normal.      No results found for any visits on 12/19/22.  Recent Results (from the past 2160 hour(Jacobb Alen))  ECHOCARDIOGRAM COMPLETE     Status: None   Collection Time: 10/17/22  2:27 PM  Result Value Ref Range   Talyn Dessert' Lateral 3.20 cm   Area-P 1/2 3.72 cm2   Est EF 60 - 65%   Comprehensive metabolic panel     Status: Abnormal   Collection Time: 11/29/22  9:21 AM  Result Value Ref Range   Glucose 96 70 - 99 mg/dL   BUN 4 (L) 6 - 24 mg/dL   Creatinine, Ser 6.96  0.57 - 1.00 mg/dL   eGFR 87 >29 BM/WUX/3.24   BUN/Creatinine Ratio 5 (L) 9 - 23   Sodium 139 134 - 144 mmol/L   Potassium 4.3 3.5 - 5.2 mmol/L   Chloride 99 96 - 106 mmol/L   CO2 22 20 - 29 mmol/L   Calcium 9.6 8.7 - 10.2 mg/dL   Total Protein 7.5 6.0 - 8.5 g/dL   Albumin 5.1 (H) 3.8 - 4.9 g/dL   Globulin, Total 2.4 1.5 - 4.5 g/dL   Bilirubin Total 0.3 0.0 - 1.2 mg/dL   Alkaline Phosphatase 54 44 - 121 IU/L   AST 16 0 - 40 IU/L   ALT 17 0 - 32 IU/L  Lipid panel     Status: None   Collection Time: 11/29/22  9:21 AM  Result Value Ref Range   Cholesterol, Total 190 100 - 199 mg/dL   Triglycerides 73 0 - 149 mg/dL   HDL 86 >16 mg/dL   VLDL Cholesterol Cal 13 5 - 40 mg/dL   LDL Chol Calc (NIH) 91 0 - 99 mg/dL   Chol/HDL Ratio 2.2 0.0 - 4.4 ratio    Comment:                                   T. Chol/HDL Ratio                                             Men  Women                               1/2 Avg.Risk  3.4    3.3                                   Avg.Risk  5.0    4.4                                2X Avg.Risk  9.6    7.1                                3X Avg.Risk 23.4   11.0   Hemoglobin A1c     Status: Abnormal   Collection Time: 11/29/22  9:21 AM  Result Value Ref Range   Hgb A1c MFr Bld 5.7 (H) 4.8 - 5.6 %    Comment:          Prediabetes: 5.7 - 6.4          Diabetes: >6.4          Glycemic control for adults with diabetes: <7.0    Est. average glucose Bld gHb Est-mCnc 117 mg/dL  CBC With Diff/Platelet     Status: None   Collection Time: 12/04/22  4:18 PM  Result Value Ref Range   WBC 6.8 3.4 - 10.8 x10E3/uL   RBC 4.06 3.77 - 5.28 x10E6/uL   Hemoglobin 13.3 11.1 - 15.9 g/dL   Hematocrit 10.9 60.4 - 46.6 %   MCV 96 79 - 97 fL   MCH 32.8 26.6 - 33.0 pg   MCHC 34.3 31.5 - 35.7 g/dL   RDW 54.0 98.1 - 19.1 %   Platelets 313 150 - 450 x10E3/uL   Neutrophils 46 Not Estab. %   Lymphs 41 Not Estab. %   Monocytes 10 Not Estab. %   Eos 2 Not Estab. %   Basos 1 Not Estab.  %   Neutrophils Absolute 3.2 1.4 - 7.0 x10E3/uL   Lymphocytes Absolute 2.8 0.7 - 3.1 x10E3/uL   Monocytes Absolute 0.6 0.1 - 0.9 x10E3/uL   EOS (ABSOLUTE) 0.1 0.0 - 0.4 x10E3/uL   Basophils Absolute 0.1 0.0 - 0.2 x10E3/uL   Immature Granulocytes 0 Not Estab. %   Immature Grans (Abs) 0.0 0.0 - 0.1 x10E3/uL      Assessment & Plan:  As per problem list.  Problem List Items Addressed This Visit       Other   Obesity (BMI 30-39.9)   Other Visit Diagnoses     Syncope and collapse    -  Primary       Return in about 3 months (around 03/21/2023) for fu with labs prior.   Total time spent: 20 minutes  Luna Fuse, MD  12/19/2022   This document may have been prepared by [facility] Voice Recognition software and as such may include unintentional dictation errors.

## 2022-12-23 ENCOUNTER — Other Ambulatory Visit: Payer: Self-pay | Admitting: Internal Medicine

## 2022-12-26 ENCOUNTER — Ambulatory Visit: Payer: 59 | Attending: Cardiology | Admitting: Cardiology

## 2022-12-26 ENCOUNTER — Encounter: Payer: Self-pay | Admitting: Cardiology

## 2022-12-26 VITALS — BP 122/86 | HR 98 | Ht 65.0 in | Wt 200.6 lb

## 2022-12-26 DIAGNOSIS — R002 Palpitations: Secondary | ICD-10-CM

## 2022-12-26 DIAGNOSIS — R600 Localized edema: Secondary | ICD-10-CM

## 2022-12-26 DIAGNOSIS — R55 Syncope and collapse: Secondary | ICD-10-CM

## 2022-12-26 DIAGNOSIS — R0789 Other chest pain: Secondary | ICD-10-CM | POA: Diagnosis not present

## 2022-12-26 DIAGNOSIS — Z8679 Personal history of other diseases of the circulatory system: Secondary | ICD-10-CM

## 2022-12-26 DIAGNOSIS — R079 Chest pain, unspecified: Secondary | ICD-10-CM

## 2022-12-26 DIAGNOSIS — R42 Dizziness and giddiness: Secondary | ICD-10-CM

## 2022-12-26 MED ORDER — DILTIAZEM HCL ER COATED BEADS 120 MG PO CP24: 120.0000 mg | ORAL_CAPSULE | Freq: Three times a day (TID) | ORAL | 3 refills | Status: DC

## 2022-12-26 MED ORDER — METOPROLOL TARTRATE 100 MG PO TABS: 100.0000 mg | ORAL_TABLET | Freq: Once | ORAL | 0 refills | Status: DC

## 2022-12-26 NOTE — Patient Instructions (Addendum)
Medication Instructions:  Your physician has recommended you make the following change in your medication:   diltiazem (CARDIZEM CD) 120 MG 24 hr capsule - Take 1 capsule (120 mg total) by mouth with breakfast, with lunch, and with evening meal  *If you need a refill on your cardiac medications before your next appointment, please call your pharmacy*  Lab Work: Get labs completed ordered by PCP If you have labs (blood work) drawn today and your tests are completely normal, you will receive your results only by: MyChart Message (if you have MyChart) OR A paper copy in the mail If you have any lab test that is abnormal or we need to change your treatment, we will call you to review the results.  Testing/Procedures:   Your cardiac CT will be scheduled at one of the below locations:   [facility] [address] [place], [place] [address] [phone]  OR   [facility] [address] [place], [place] [address] [phone]  If scheduled at [facility] or [facility], please arrive 15 mins early for check-in and test prep.  There is spacious parking and easy access to the radiology department from the [facility] entrance. Please enter here and check-in with the desk attendant.   Please follow these instructions carefully (unless otherwise directed):  An IV will be required for this test and Nitroglycerin will be given.   On the Night Before the Test: Be sure to Drink plenty of water. Do not consume any caffeinated/decaffeinated beverages or chocolate 12 hours prior to your test. Do not take any antihistamines 12 hours prior to your test.  On the Day of the Test: Drink plenty of water until 1 hour prior to the test. Do not eat any food 1 hour prior to test. You may take your regular medications prior to the test.  Take metoprolol (Lopressor) two  hours prior to test. If you take Furosemide/Hydrochlorothiazide/Spironolactone, please HOLD on the morning of the test. FEMALES- please wear underwire-free bra if available, avoid dresses & tight clothing  After the Test: Drink plenty of water. After receiving IV contrast, you may experience a mild flushed feeling. This is normal. On occasion, you may experience a mild rash up to 24 hours after the test. This is not dangerous. If this occurs, you can take Benadryl 25 mg and increase your fluid intake. If you experience trouble breathing, this can be serious. If it is severe call 911 IMMEDIATELY. If it is mild, please call our office. If you take any of these medications: Glipizide/Metformin, Avandament, Glucavance, please do not take 48 hours after completing test unless otherwise instructed.  We will call to schedule your test 2-4 weeks out understanding that some insurance companies will need an authorization prior to the service being performed.   For more information and frequently asked questions, please visit our website : [email or web address]  For non-scheduling related questions, please contact the cardiac imaging nurse navigator should you have any questions/concerns: Cardiac Imaging Nurse Navigators Direct Office Dial: [phone]   For scheduling needs, including cancellations and rescheduling, please call [place], [phone].   Follow-Up: At [facility], you and your health needs are our priority.  As part of our continuing mission to provide you with exceptional heart care, we have created designated Provider Care Teams.  These Care Teams include your primary Cardiologist (physician) and Advanced Practice Providers (APPs -  Physician Assistants and Nurse  Practitioners) who all work together to provide you with the care you need, when you need it.  Your next appointment:   After coronary CTA is completed   Provider:   You may see Yvonne Kendall, MD  or one of the following Advanced Practice Providers on your designated Care Team:   Nicolasa Ducking, NP Eula Listen, PA-C Cadence Fransico Michael, PA-C Charlsie Quest, NP Carlos Levering, NP    Other Instructions - Ambulatory referral to Cardiac Electrophysiology

## 2022-12-26 NOTE — Progress Notes (Unsigned)
Cardiology Office Note:  .   Date:  12/27/2022  ID:  April [name], DOB Jul 28, 1970, MRN [id number] PCP: Sherron Monday, MD  [facility] Providers Cardiologist:  Yvonne Kendall, MD    History of Present Illness: .   April [name] is a 52 y.o. female with past medical history, gonadal vein thrombosis, fibromyalgia, factor V Leiden mutation previously on Xarelto which was discontinued for severe anemia, bladder stimulator, diastolic dysfunction, anemia, and atypical chest pain, who presents today for follow-up.   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  Echocardiogram completed September 2016 showed normal LV function with moderate diastolic dysfunction.  November 2017 gentleman stress testing for atypical chest pain which did not show any evidence of ischemia or scar.  She had a history of palpitations for which she was previously on beta-blocker therapy (metoprolol followed by carvedilol).  Beta-blocker therapy worsened urinary incontinence she has been managed on calcium channel blocker diltiazem since that time.  Follow-up echocardiogram in September 2018 showed stable normal LV function without mention of diastolic dysfunction.  Most recent echocardiogram in 03/2020 showed an EF of 60 to 65% with normal diastolic parameters.  Coronary CT 2021 revealed a coronary calcium score of 0 with normal coronary arteries.  There was question of PFO that this was apparently not felt to be significant but structural heart team and was not seen on echocardiogram in February 2022.  She was evaluated by EP Dr. Graciela Husbands in 12/2020 for POTS evaluation.  It was determined she did not have POTS as her orthostatic vital signs showed no evidence of tachycardia.  She then presented to the [facility] emergency department in  05/2021 for palpitations, headache and chest pain.  Workup was overall reassuring although she was noted to have mild hyponatremia and hypokalemia.  She was evaluated by Dr. Lowell Bouton for more frequent palpitations.  At that time Dr. Lorelle Formosa noted she did not have a diagnosis of heart failure and given her recent hyponatremia was recommended that she discontinue furosemide.  However 1 month follow-up she had restarted Lasix following a 10 pound weight gain in 4 days.  Some the importance of staying well-hydrated and maintaining normal electrolytes is reinforced.  She was seen in February 2024 reporting increased palpitations and episodes of headache and chest discomfort.  Her EKG showed normal sinus rhythm.  Her diltiazem was increased to 20 mg twice daily with 30 mg for breakthrough palpitations.  She was evaluated by his emergency department atypical chest pain in April 2024.  It was reproducible with palpation and her cardiac workup was unremarkable.  D-dimer was negative and high-sensitivity troponin was negative x 2.   She was last seen in clinic 09/22/2022 reporting at that time she been doing okay.  Noted increased palpitations requiring as needed: Cardizem frequently.  Notes her palpitations can occur at random and when excited upon awakening in the morning or when in church.  She has normal pain complaints including atypical chest pain, dizziness, leg aching, chest pain, and overall feeling unwell specifically with barometric pressure changes.  She was wearing a ZIO monitor for 2 weeks as well as repeat echocardiogram.  She returns to clinic today for follow-up with continued concerns of palpitations that require an increased amount of her as needed diltiazem.  She also continues to have  chest discomfort that is not only with rest or exertion but is random.  She has occasional headaches and dizziness.  She continues to have random symptoms from her fibromyalgia.  She is concerned over the results that she had on  echocardiogram as well as as a heart monitor.  She states that she has been evaluated by EP in the past for POTS and was advised that she did not have POTS.  Her bigger concern today is that she had a syncopal episode at church.  She stated that she remembers looking at a bench and ended up on the ground she is not sure of how long she was down.  She did not come to the emergency department for evaluation at that time but her PCP had sent her for head CT.  She had previously been recommended to obtain an AliveCor monitor, unfortunately she has still been unable to do that. she states that she has been compliant with her current medication regimen.  Denies any hospitalization or visits to the emergency department.  ROS: 10 point review of systems has been reviewed and considered negative except what is been listed in the HPI  Studies Reviewed: [name]   EKG Interpretation Date/Time:  Tuesday December 26 2022 14:11:33 EST Ventricular Rate:  85 PR Interval:  118 QRS Duration:  84 QT Interval:  354 QTC Calculation: 421 R Axis:   45  Text Interpretation: Normal sinus rhythm with sinus arrhythmia Normal ECG When compared with ECG of 22-Sep-2022 14:06, No significant change was found Confirmed by Charlsie Quest ([id number]) on 12/26/2022 2:15:22 PM    2D echo 10/17/2022 1. Left ventricular ejection fraction, by estimation, is 60 to 65%. The  left ventricle has normal function. The left ventricle has no regional  wall motion abnormalities. Left ventricular diastolic parameters were  normal. The average left ventricular  global longitudinal strain is -18.6 %.   2. Right ventricular systolic function is normal. The right ventricular  size is normal. Tricuspid regurgitation signal is inadequate for assessing  PA pressure.   3. The mitral valve is normal in structure. No evidence of mitral valve  regurgitation. No evidence of mitral stenosis.   4. The aortic valve has an indeterminant number of cusps. Aortic  valve  regurgitation is not visualized. Aortic valve sclerosis is present, with  no evidence of aortic valve stenosis.   5. The inferior vena cava is normal in size with greater than 50%  respiratory variability, suggesting right atrial pressure of 3 mmHg.   Event Monitoring (Zio) 10/13/2022   The patient was monitored for 14 days.   The predominant rhythm was sinus with an average rate of 91 bpm in sinus (range 48-139 bpm in sinus).   There were rare PAC's and PVC's.   13 supraventricular runs occurred, lasting up to 7.4 seconds with a maximum rate of 210 bpm.   No sustained arrhythmia or prolonged pause was observed.   There were 71 patient triggered and 18 diary events, most of which correspond to sinus rhythm.  A few events include PAC's and PSVT.   Predominantly sinus rhythm with brief PSVT, as detailed above, and rare PAC's and PVC's.  2D echo 03/12/2020 1. Left ventricular ejection fraction, by estimation, is 60 to 65%. The  left ventricle has normal function. The left ventricle has no regional  wall motion abnormalities. Left ventricular diastolic parameters were  normal.   2. Right ventricular systolic function is normal. The right ventricular  size is normal.  3. The inferior vena cava is dilated in size with >50% respiratory  variability, suggesting right atrial pressure of 8 mmHg.   Coronary CTA  12/18/2019 IMPRESSION: 1. No evidence of CAD, CADRADS = 0.   2. Coronary calcium score of 0. This was 0 percentile for age and sex matched control.   3. Normal coronary origin with left dominance.   Risk Assessment/Calculations:             Physical Exam:   VS:  BP 122/86 (BP Location: Left Arm, Patient Position: Sitting, Cuff Size: Large)   Pulse 98   Ht 5\' 5"  (1.651 m)   Wt 200 lb 9.6 oz (91 kg)   LMP 05/20/2011   SpO2 98%   BMI 33.38 kg/m    Wt Readings from Last 3 Encounters:  12/26/22 200 lb 9.6 oz (91 kg)  12/19/22 202 lb 9.6 oz (91.9 kg)  12/04/22 198 lb  (89.8 kg)    GEN: Well nourished, well developed in no acute distress NECK: No JVD; No carotid bruits CARDIAC: RRR, no murmurs, rubs, gallops RESPIRATORY:  Clear to auscultation without rales, wheezing or rhonchi  ABDOMEN: Soft, non-tender, non-distended EXTREMITIES:  No edema; No deformity   ASSESSMENT AND PLAN: .   Syncope with collapse with unknown duration.  Patient did not follow-up in the emergency department.  PCP sent her for head CT that was normal.  Carotid duplex was also completed as ordered by her PCP.  Patient has not had any further episodes since.  Recently had a heart monitor in 10/2022 as well as an echocardiogram.  Continued atypical chest pain that she describes as a tightening heavy type pressure.  Recently had a coronary CTA in 2021 with a calcium score of 0.  Continues to be concerned over family history.  Rescheduled for repeat coronary CTA to rule out any structural abnormalities that could have caused her syncopal event as well as any coronary artery stenosis.  With patient's heart rate today being 85 on EKG she will likely need to 100 mg of metoprolol prior to procedure along with already scheduled diltiazem.  EKG completed today revealed sinus rhythm and sinus arrhythmia with a rate of 85 with no acute changes from prior studies.  Palpitations with dizziness and recent syncopal episode.  TSH of 2.050.  Recent ZIO completed in September revealed predominant sinus with an average heart rate of 91 bpm, rare PACs and PVCs, occasional SVT noted and no sustained arrhythmia or prolonged pause was observed.  She has been advised she can have her diltiazem 120 mg 3 times daily to assist with her palpitations.  Also will refer her back to EP for the possibility of ILR placement as she has had multiple monitors placed with unfounded results for her symptoms.  She may would benefit from a loop recorder with this recent syncopal episode.  She had previously been advised to obtain an  AliveCor monitor, unfortunately she has been unable to obtain the monitor.  She does continue on Cardizem with an additional 30 mg as needed for breakthrough palpitations as well.  Previous concern for history of diastolic dysfunction and bilateral lower extremity edema.  Recent echocardiogram in the setting of dysphagia and September 2016 showed normal LV function with moderate diastolic dysfunction.  Follow-up echocardiogram in September 2018 showed stable LV function without mention of diastolic dysfunction.  Most recent echocardiogram was completed in September 2024 which revealed a normal LVEF of 60 to 65%, no regional wall motion abnormalities and  no mention of diastolic dysfunction.  She continues to have intermittent swelling to her bilateral lower extremities has been continued on furosemide 20 mg daily.         Dispo: Patient to return to clinic to see MD/APP once testing is completed or sooner if needed for reevaluation of symptoms.  Signed, Jahnaya Branscome, NP

## 2022-12-27 ENCOUNTER — Encounter: Payer: Self-pay | Admitting: Radiology

## 2022-12-30 ENCOUNTER — Other Ambulatory Visit: Payer: Self-pay | Admitting: Internal Medicine

## 2023-01-01 ENCOUNTER — Other Ambulatory Visit (HOSPITAL_COMMUNITY): Payer: Self-pay | Admitting: *Deleted

## 2023-01-01 ENCOUNTER — Encounter (HOSPITAL_COMMUNITY): Payer: Self-pay

## 2023-01-01 DIAGNOSIS — R079 Chest pain, unspecified: Secondary | ICD-10-CM

## 2023-01-01 MED ORDER — PREDNISONE 50 MG PO TABS: ORAL_TABLET | ORAL | 0 refills | Status: DC

## 2023-01-01 MED ORDER — DIPHENHYDRAMINE HCL 50 MG PO CAPS: ORAL_CAPSULE | ORAL | 0 refills | Status: DC

## 2023-01-02 ENCOUNTER — Telehealth (HOSPITAL_COMMUNITY): Payer: Self-pay | Admitting: *Deleted

## 2023-01-02 NOTE — Telephone Encounter (Signed)
Reaching out to patient to offer assistance regarding upcoming cardiac imaging study; pt verbalizes understanding of appt date/time, parking situation and where to check in, pre-test NPO status and medications ordered, and verified current allergies; name and call back number provided for further questions should they arise  Larey Brick RN Navigator Cardiac Imaging [facility] [phone] office [phone] cell   Patient states she had her CT scan three years ago without the steroid prep and did not have a reaction.  Patient to take 100mg  metoprolol tartrate two hours prior to her cardiac CT scan.

## 2023-01-03 ENCOUNTER — Ambulatory Visit
Admission: RE | Admit: 2023-01-03 | Discharge: 2023-01-03 | Disposition: A | Discharge status: Home / Self Care | Payer: 59 | Source: Ambulatory Visit | Attending: Cardiology | Admitting: Cardiology

## 2023-01-03 DIAGNOSIS — R079 Chest pain, unspecified: Secondary | ICD-10-CM | POA: Diagnosis not present

## 2023-01-03 DIAGNOSIS — Z79899 Other long term (current) drug therapy: Secondary | ICD-10-CM | POA: Diagnosis not present

## 2023-01-03 MED ORDER — NITROGLYCERIN 0.4 MG SL SUBL
0.8000 mg | SUBLINGUAL_TABLET | SUBLINGUAL | Status: DC | PRN
  Administered 2023-01-03: 0.8 mg via SUBLINGUAL
  Filled 2023-01-03 (×2): qty 25

## 2023-01-03 MED ORDER — IOHEXOL 350 MG/ML SOLN
80.0000 mL | Freq: Once | INTRAVENOUS | Status: AC | PRN
  Administered 2023-01-03: 80 mL via INTRAVENOUS

## 2023-01-03 NOTE — Progress Notes (Signed)
Coronary calcium score of 0. No evidence of coronary artery disease. Overall a very reassuring study.

## 2023-01-03 NOTE — Progress Notes (Signed)

## 2023-01-15 NOTE — Progress Notes (Signed)
No changes noted on the chest CT completed during cardiac CT. Small hiatal hernia which is unchanged from prior studies.

## 2023-01-16 DIAGNOSIS — R55 Syncope and collapse: Secondary | ICD-10-CM | POA: Diagnosis not present

## 2023-01-16 DIAGNOSIS — R002 Palpitations: Secondary | ICD-10-CM | POA: Diagnosis not present

## 2023-01-16 DIAGNOSIS — R609 Edema, unspecified: Secondary | ICD-10-CM | POA: Diagnosis not present

## 2023-01-16 DIAGNOSIS — R0609 Other forms of dyspnea: Secondary | ICD-10-CM | POA: Diagnosis not present

## 2023-02-02 DIAGNOSIS — R002 Palpitations: Secondary | ICD-10-CM | POA: Diagnosis not present

## 2023-02-02 DIAGNOSIS — I471 Supraventricular tachycardia, unspecified: Secondary | ICD-10-CM | POA: Diagnosis not present

## 2023-02-02 DIAGNOSIS — I491 Atrial premature depolarization: Secondary | ICD-10-CM | POA: Diagnosis not present

## 2023-02-02 DIAGNOSIS — R0609 Other forms of dyspnea: Secondary | ICD-10-CM | POA: Diagnosis not present

## 2023-02-02 DIAGNOSIS — I493 Ventricular premature depolarization: Secondary | ICD-10-CM | POA: Diagnosis not present

## 2023-02-06 DIAGNOSIS — M9902 Segmental and somatic dysfunction of thoracic region: Secondary | ICD-10-CM | POA: Diagnosis not present

## 2023-02-06 DIAGNOSIS — M5414 Radiculopathy, thoracic region: Secondary | ICD-10-CM | POA: Diagnosis not present

## 2023-02-08 DIAGNOSIS — M9902 Segmental and somatic dysfunction of thoracic region: Secondary | ICD-10-CM | POA: Diagnosis not present

## 2023-02-08 DIAGNOSIS — M5414 Radiculopathy, thoracic region: Secondary | ICD-10-CM | POA: Diagnosis not present

## 2023-02-09 ENCOUNTER — Ambulatory Visit: Payer: 59 | Admitting: Cardiology

## 2023-02-09 DIAGNOSIS — I471 Supraventricular tachycardia, unspecified: Secondary | ICD-10-CM | POA: Diagnosis not present

## 2023-02-09 DIAGNOSIS — R002 Palpitations: Secondary | ICD-10-CM | POA: Diagnosis not present

## 2023-02-09 DIAGNOSIS — R55 Syncope and collapse: Secondary | ICD-10-CM | POA: Diagnosis not present

## 2023-02-14 ENCOUNTER — Other Ambulatory Visit: Payer: Self-pay | Admitting: Cardiology

## 2023-02-14 ENCOUNTER — Other Ambulatory Visit: Payer: Self-pay | Admitting: Internal Medicine

## 2023-02-14 MED ORDER — DILTIAZEM HCL 30 MG PO TABS: ORAL_TABLET | ORAL | 0 refills | Status: DC

## 2023-02-14 NOTE — Progress Notes (Signed)
 Patient called in needing Rx of Diltiazem  30mg  tabs, PRN. Rx sent for 30 tabs, no refills to [facility]

## 2023-02-20 DIAGNOSIS — R002 Palpitations: Secondary | ICD-10-CM | POA: Diagnosis not present

## 2023-02-20 DIAGNOSIS — I493 Ventricular premature depolarization: Secondary | ICD-10-CM | POA: Diagnosis not present

## 2023-02-20 DIAGNOSIS — I491 Atrial premature depolarization: Secondary | ICD-10-CM | POA: Diagnosis not present

## 2023-02-20 DIAGNOSIS — I471 Supraventricular tachycardia, unspecified: Secondary | ICD-10-CM | POA: Diagnosis not present

## 2023-02-20 DIAGNOSIS — R55 Syncope and collapse: Secondary | ICD-10-CM | POA: Diagnosis not present

## 2023-02-23 ENCOUNTER — Other Ambulatory Visit: Payer: Self-pay | Admitting: Cardiology

## 2023-02-26 ENCOUNTER — Other Ambulatory Visit: Payer: Self-pay | Admitting: Internal Medicine

## 2023-02-26 ENCOUNTER — Telehealth: Payer: Self-pay | Admitting: Internal Medicine

## 2023-02-26 NOTE — Telephone Encounter (Signed)
*  STAT* If patient is at the pharmacy, call can be transferred to refill team.   1. Which medications need to be refilled? (please list name of each medication and dose if known)  diltiazem (CARDIZEM) 30 MG tablet  2. Which pharmacy/location (including street and city if local pharmacy) is medication to be sent to? [facility] - [place], [place] - [address]  3. Do they need a 30 day or 90 day supply?   30 day supply  Patient states she is completely out of medication.

## 2023-02-27 MED ORDER — DILTIAZEM HCL 30 MG PO TABS: ORAL_TABLET | ORAL | 0 refills | Status: DC

## 2023-02-27 NOTE — Telephone Encounter (Signed)
Requested Prescriptions   Signed Prescriptions Disp Refills   diltiazem (CARDIZEM) 30 MG tablet 30 tablet 0    Sig: TAKE 1 TABLET BY MOUTH EVERY 8 HOURS AS NEEDED FOR BREAKTHROUGH PALPITATIONS AS DIRECTED.    Authorizing Provider: END, CHRISTOPHER    Ordering User: Kendrick Fries

## 2023-03-01 ENCOUNTER — Other Ambulatory Visit: Payer: Self-pay | Admitting: Internal Medicine

## 2023-03-01 ENCOUNTER — Ambulatory Visit: Payer: 59 | Admitting: Cardiology

## 2023-03-03 ENCOUNTER — Other Ambulatory Visit: Payer: Self-pay | Admitting: Internal Medicine

## 2023-03-05 DIAGNOSIS — M9902 Segmental and somatic dysfunction of thoracic region: Secondary | ICD-10-CM | POA: Diagnosis not present

## 2023-03-05 DIAGNOSIS — M5414 Radiculopathy, thoracic region: Secondary | ICD-10-CM | POA: Diagnosis not present

## 2023-03-06 ENCOUNTER — Other Ambulatory Visit: Payer: Self-pay | Admitting: Internal Medicine

## 2023-03-06 DIAGNOSIS — R55 Syncope and collapse: Secondary | ICD-10-CM | POA: Diagnosis not present

## 2023-03-06 DIAGNOSIS — D6851 Activated protein C resistance: Secondary | ICD-10-CM | POA: Diagnosis not present

## 2023-03-06 DIAGNOSIS — D6859 Other primary thrombophilia: Secondary | ICD-10-CM | POA: Diagnosis not present

## 2023-03-06 DIAGNOSIS — I4719 Other supraventricular tachycardia: Secondary | ICD-10-CM | POA: Diagnosis not present

## 2023-03-06 DIAGNOSIS — I498 Other specified cardiac arrhythmias: Secondary | ICD-10-CM | POA: Diagnosis not present

## 2023-03-06 DIAGNOSIS — R002 Palpitations: Secondary | ICD-10-CM | POA: Diagnosis not present

## 2023-03-06 DIAGNOSIS — M797 Fibromyalgia: Secondary | ICD-10-CM | POA: Diagnosis not present

## 2023-03-07 ENCOUNTER — Ambulatory Visit: Payer: 59 | Attending: Cardiology | Admitting: Cardiology

## 2023-03-07 ENCOUNTER — Encounter: Payer: Self-pay | Admitting: Cardiology

## 2023-03-07 VITALS — BP 116/82 | HR 82 | Ht 65.0 in | Wt 199.8 lb

## 2023-03-07 DIAGNOSIS — R600 Localized edema: Secondary | ICD-10-CM

## 2023-03-07 DIAGNOSIS — R42 Dizziness and giddiness: Secondary | ICD-10-CM

## 2023-03-07 DIAGNOSIS — R55 Syncope and collapse: Secondary | ICD-10-CM

## 2023-03-07 DIAGNOSIS — Z8679 Personal history of other diseases of the circulatory system: Secondary | ICD-10-CM

## 2023-03-07 DIAGNOSIS — R002 Palpitations: Secondary | ICD-10-CM

## 2023-03-07 DIAGNOSIS — R0789 Other chest pain: Secondary | ICD-10-CM

## 2023-03-07 NOTE — Patient Instructions (Signed)
Medication Instructions:  No changes *If you need a refill on your cardiac medications before your next appointment, please call your pharmacy*   Lab Work: None ordered If you have labs (blood work) drawn today and your tests are completely normal, you will receive your results only by: MyChart Message (if you have MyChart) OR A paper copy in the mail If you have any lab test that is abnormal or we need to change your treatment, we will call you to review the results.   Testing/Procedures: None ordered   Follow-Up: At [facility], you and your health needs are our priority.  As part of our continuing mission to provide you with exceptional heart care, we have created designated Provider Care Teams.  These Care Teams include your primary Cardiologist (physician) and Advanced Practice Providers (APPs -  Physician Assistants and Nurse Practitioners) who all work together to provide you with the care you need, when you need it.  We recommend signing up for the patient portal called "MyChart".  Sign up information is provided on this After Visit Summary.  MyChart is used to connect with patients for Virtual Visits (Telemedicine).  Patients are able to view lab/test results, encounter notes, upcoming appointments, etc.  Non-urgent messages can be sent to your provider as well.   To learn more about what you can do with MyChart, go to [email or web address].    Your next appointment:   4 month(s)  Provider:   You may see Yvonne Kendall, MD or one of the following Advanced Practice Providers on your designated Care Team:   Charlsie Quest, NP

## 2023-03-07 NOTE — Telephone Encounter (Signed)
 Patient has an appointment today

## 2023-03-07 NOTE — Progress Notes (Signed)
 Cardiology Office Note:  .   Date:  03/07/2023  ID:  April [name], DOB [birth year], MRN [id number] PCP: Albina GORMAN Dine, MD  [facility] Providers Cardiologist:  Lonni Hanson, MD Electrophysiologist:  OLE ONEIDA HOLTS, MD    History of Present Illness: .   April [name] is a 53 y.o. female with a past medical history of gonadal vein thrombosis, fibromyalgia, factor V Leiden mutation previously on rivaroxaban  which was discontinued for severe anemia, bladder stimulator, diastolic dysfunction, anemia, and atypical chest pain, who presents today for follow-up.   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  Echocardiogram completed September 2016 showed normal LV function with moderate diastolic dysfunction.  November 2017 gentleman stress testing for atypical chest pain which did not show any evidence of ischemia or scar.  She had a history of palpitations for which she was previously on beta-blocker therapy (metoprolol  followed by carvedilol ).  Beta-blocker therapy worsened urinary incontinence she has been managed on calcium channel blocker diltiazem  since that time.  Follow-up echocardiogram in September 2018 showed stable normal LV function without mention of diastolic dysfunction.  Most recent echocardiogram in 03/2020 showed an EF of 60 to 65% with normal diastolic parameters.  Coronary CT 2021 revealed a coronary calcium score of 0 with normal coronary arteries.  There was question of PFO that this was apparently not felt to be significant but structural heart team and was not seen on echocardiogram in February 2022.  She was evaluated by EP Dr. Fernande in 12/2020 for POTS evaluation.  It was determined she did not have POTS as her orthostatic vital signs showed no evidence of tachycardia.  She then  presented to the [facility] emergency department in 05/2021 for palpitations, headache and chest pain.  Workup was overall reassuring although she was noted to have mild hyponatremia and hypokalemia.  She was evaluated by Dr. BENNETTA for more frequent palpitations.  At that time Dr. Marty noted she did not have a diagnosis of heart failure and given her recent hyponatremia was recommended that she discontinue furosemide .  However 1 month follow-up she had restarted Lasix  following a 10 pound weight gain in 4 days.  Some the importance of staying well-hydrated and maintaining normal electrolytes is reinforced.  She was seen in February 2024 reporting increased palpitations and episodes of headache and chest discomfort.  Her EKG showed normal sinus rhythm.  Her diltiazem  was increased to 20 mg twice daily with 30 mg for breakthrough palpitations.  She was evaluated by his emergency department atypical chest pain in April 2024.  It was reproducible with palpation and her cardiac workup was unremarkable.  D-dimer was negative and high-sensitivity troponin was negative x 2.   She was last seen in clinic 12/26/2022 with continued concerns of palpitations that required an increase amount of her as needed diltiazem .  She also continued to have complaints of chest pain that was not only with exertion but was random with at rest.  She is discussing that her previous visit with EP where she was advised that she does not have POTS.  She also stated that she had a syncopal episode at church.  She was scheduled for coronary CTA, carotid duplex with previously been ordered by her PCP .  There were no other medication changes that were made.  She returns to clinic today stating that overall she has been doing well.  She continues to have palpitations with atypical chest discomfort as before she did follow-up with cardiology and subsequently was referred to EP in [facility].  She is getting ready to undergo EP studies and possible  ablation for atrial tachycardia versus SVT.  She did undergo a tilt test study which were negative for POTS.  She denies any further syncopal episodes.   States that she has been compliant with  medication regimen without adverse events.  denies any hospitalizations or visits to the emergency department  ROS: 10 point review of systems is reviewed and considered negative with exception was been listed in the HPI  Studies Reviewed: .       cCTA 01/03/2023 IMPRESSION: 1. Coronary calcium score of 0.   2. Normal coronary origin with right dominance.   3. No evidence of CAD.   4. CAD-RADS 0. Consider non-atherosclerotic causes of chest pain.   2D echo 10/17/2022 1. Left ventricular ejection fraction, by estimation, is 60 to 65%. The  left ventricle has normal function. The left ventricle has no regional  wall motion abnormalities. Left ventricular diastolic parameters were  normal. The average left ventricular  global longitudinal strain is -18.6 %.   2. Right ventricular systolic function is normal. The right ventricular  size is normal. Tricuspid regurgitation signal is inadequate for assessing  PA pressure.   3. The mitral valve is normal in structure. No evidence of mitral valve  regurgitation. No evidence of mitral stenosis.   4. The aortic valve has an indeterminant number of cusps. Aortic valve  regurgitation is not visualized. Aortic valve sclerosis is present, with  no evidence of aortic valve stenosis.   5. The inferior vena cava is normal in size with greater than 50%  respiratory variability, suggesting right atrial pressure of 3 mmHg.    Event Monitoring (Zio) 10/13/2022   The patient was monitored for 14 days.   The predominant rhythm was sinus with an average rate of 91 bpm in sinus (range 48-139 bpm in sinus).   There were rare PAC's and PVC's.   13 supraventricular runs occurred, lasting up to 7.4 seconds with a maximum rate of 210 bpm.   No sustained arrhythmia or  prolonged pause was observed.   There were 71 patient triggered and 18 diary events, most of which correspond to sinus rhythm.  A few events include PAC's and PSVT.   Predominantly sinus rhythm with brief PSVT, as detailed above, and rare PAC's and PVC's.   2D echo 03/12/2020 1. Left ventricular ejection fraction, by estimation, is 60 to 65%. The  left ventricle has normal function. The left ventricle has no regional  wall motion abnormalities. Left ventricular diastolic parameters were  normal.   2. Right ventricular systolic function is normal. The right ventricular  size is normal.   3. The inferior vena cava is dilated in size with >50% respiratory  variability, suggesting right atrial pressure of 8 mmHg.    Coronary CTA  12/18/2019 IMPRESSION: 1. No evidence of CAD, CADRADS = 0.   2. Coronary calcium score of 0. This was 0 percentile for age and sex matched control.   3. Normal coronary origin with left dominance. Risk Assessment/Calculations:             Physical Exam:   VS:  BP 116/82 (BP Location: Left Arm, Patient Position: Sitting, Cuff Size: Large)   Pulse 82  Ht 5' 5 (1.651 m)   Wt 199 lb 12.8 oz (90.6 kg)   LMP 05/20/2011   SpO2 99%   BMI 33.25 kg/m    Wt Readings from Last 3 Encounters:  03/07/23 199 lb 12.8 oz (90.6 kg)  12/26/22 200 lb 9.6 oz (91 kg)  12/19/22 202 lb 9.6 oz (91.9 kg)    GEN: Well nourished, well developed in no acute distress NECK: No JVD; No carotid bruits CARDIAC: RRR, no murmurs, rubs, gallops RESPIRATORY:  Clear to auscultation without rales, wheezing or rhonchi  ABDOMEN: Soft, non-tender, non-distended EXTREMITIES:  No edema; No deformity   ASSESSMENT AND PLAN: .   Palpitations with dizziness and syncopal episode.  Evaluated at cardiology in [facility] and EP and is converted to undergo EP studies and potential ablation.  Coronary CTA revealed coronary calcium score of 0 without concern for coronary artery disease.  She has been  continued on diltiazem  120 mg daily an additional 30 mg as needed for breakthrough palpitations.  Has been encouraged to continue to follow-up with EP in [facility] and to determine if she wants to continue following up here or in [facility] with primary cardiology.  Chronic atypical chest pain that she describes as a tight and heavy type pressure that can be related to meals.  Coronary CTA recently repeated which revealed a continued calcium score of 0.  Recommended to consider not cardiac causes of chest discomfort.  Syncope with collapse without any reoccurrence.  Recently wore an additional heart monitor at [facility] which revealed some atrial tachycardia.  Has continued follow-up with EP for potential ablation procedure.  Coronary CTA revealed no structural abnormalities.  History of diastolic dysfunction and peripheral edema.  Recent echocardiogram completed 10/2022 revealed an LVEF of 60 to 65%, no RWMA, diastolic parameters were normal, and no evidence of valvular abnormalities were noted.  She continues to have intermittent swelling to bilateral lower bases been continued on furosemide  20 mg daily as well as to participate in conservative therapy of elevating her extremities, decrease her sodium intake, compression stockings.       Dispo: Patient return to clinic to see MD/APP in 3 to 4 months or sooner if needed  Signed, Jahanna Raether, NP

## 2023-03-13 ENCOUNTER — Other Ambulatory Visit: Payer: Self-pay | Admitting: Internal Medicine

## 2023-03-14 ENCOUNTER — Institutional Professional Consult (permissible substitution): Payer: 59 | Admitting: Cardiology

## 2023-03-16 ENCOUNTER — Telehealth: Payer: Self-pay

## 2023-03-16 DIAGNOSIS — M5414 Radiculopathy, thoracic region: Secondary | ICD-10-CM | POA: Diagnosis not present

## 2023-03-16 DIAGNOSIS — I4719 Other supraventricular tachycardia: Secondary | ICD-10-CM | POA: Diagnosis not present

## 2023-03-16 DIAGNOSIS — M9902 Segmental and somatic dysfunction of thoracic region: Secondary | ICD-10-CM | POA: Diagnosis not present

## 2023-03-16 NOTE — Telephone Encounter (Signed)
Informed via Mychart message

## 2023-03-16 NOTE — Telephone Encounter (Signed)
Patient called stating that she's having extensive blood work done with [facility] and wants to know does she still need to get the fasting labs done here too?

## 2023-03-23 ENCOUNTER — Ambulatory Visit: Payer: 59 | Admitting: Internal Medicine

## 2023-03-26 ENCOUNTER — Other Ambulatory Visit: Payer: 59

## 2023-03-26 DIAGNOSIS — R55 Syncope and collapse: Secondary | ICD-10-CM | POA: Diagnosis not present

## 2023-03-26 DIAGNOSIS — E785 Hyperlipidemia, unspecified: Secondary | ICD-10-CM | POA: Diagnosis not present

## 2023-03-26 DIAGNOSIS — Z131 Encounter for screening for diabetes mellitus: Secondary | ICD-10-CM

## 2023-03-27 ENCOUNTER — Ambulatory Visit (INDEPENDENT_AMBULATORY_CARE_PROVIDER_SITE_OTHER): Payer: 59 | Admitting: Internal Medicine

## 2023-03-27 ENCOUNTER — Encounter: Payer: Self-pay | Admitting: Internal Medicine

## 2023-03-27 VITALS — BP 132/70 | HR 98 | Temp 98.7°F | Ht 65.0 in | Wt 197.0 lb

## 2023-03-27 DIAGNOSIS — I479 Paroxysmal tachycardia, unspecified: Secondary | ICD-10-CM | POA: Diagnosis not present

## 2023-03-27 DIAGNOSIS — R110 Nausea: Secondary | ICD-10-CM | POA: Diagnosis not present

## 2023-03-27 DIAGNOSIS — R7303 Prediabetes: Secondary | ICD-10-CM

## 2023-03-27 DIAGNOSIS — K588 Other irritable bowel syndrome: Secondary | ICD-10-CM

## 2023-03-27 DIAGNOSIS — I10 Essential (primary) hypertension: Secondary | ICD-10-CM

## 2023-03-27 DIAGNOSIS — E669 Obesity, unspecified: Secondary | ICD-10-CM

## 2023-03-27 LAB — LIPID PANEL
Chol/HDL Ratio: 2.4 {ratio} (ref 0.0–4.4)
Cholesterol, Total: 187 mg/dL (ref 100–199)
HDL: 78 mg/dL (ref >39)
LDL Chol Calc (NIH): 93 mg/dL (ref 0–99)
Triglycerides: 91 mg/dL (ref 0–149)
VLDL Cholesterol Cal: 16 mg/dL (ref 5–40)

## 2023-03-27 LAB — COMPREHENSIVE METABOLIC PANEL
ALT: 16 [IU]/L (ref 0–32)
AST: 13 [IU]/L (ref 0–40)
Albumin: 4.7 g/dL (ref 3.8–4.9)
Alkaline Phosphatase: 54 [IU]/L (ref 44–121)
BUN/Creatinine Ratio: 8 — ABNORMAL LOW (ref 9–23)
BUN: 6 mg/dL (ref 6–24)
Bilirubin Total: 0.2 mg/dL (ref 0.0–1.2)
CO2: 24 mmol/L (ref 20–29)
Calcium: 9.7 mg/dL (ref 8.7–10.2)
Chloride: 102 mmol/L (ref 96–106)
Creatinine, Ser: 0.78 mg/dL (ref 0.57–1.00)
Globulin, Total: 2.7 g/dL (ref 1.5–4.5)
Glucose: 101 mg/dL — ABNORMAL HIGH (ref 70–99)
Potassium: 4.6 mmol/L (ref 3.5–5.2)
Sodium: 141 mmol/L (ref 134–144)
Total Protein: 7.4 g/dL (ref 6.0–8.5)
eGFR: 92 mL/min/{1.73_m2} (ref >59)

## 2023-03-27 LAB — CBC WITH DIFF/PLATELET
Basophils Absolute: 0.1 10*3/uL (ref 0.0–0.2)
Basos: 1 %
EOS (ABSOLUTE): 0.1 10*3/uL (ref 0.0–0.4)
Eos: 2 %
Hematocrit: 37.2 % (ref 34.0–46.6)
Hemoglobin: 13.2 g/dL (ref 11.1–15.9)
Immature Grans (Abs): 0 10*3/uL (ref 0.0–0.1)
Immature Granulocytes: 0 %
Lymphocytes Absolute: 2.2 10*3/uL (ref 0.7–3.1)
Lymphs: 34 %
MCH: 34.5 pg — ABNORMAL HIGH (ref 26.6–33.0)
MCHC: 35.5 g/dL (ref 31.5–35.7)
MCV: 97 fL (ref 79–97)
Monocytes Absolute: 0.5 10*3/uL (ref 0.1–0.9)
Monocytes: 8 %
Neutrophils Absolute: 3.6 10*3/uL (ref 1.4–7.0)
Neutrophils: 55 %
Platelets: 348 10*3/uL (ref 150–450)
RBC: 3.83 x10E6/uL (ref 3.77–5.28)
RDW: 12.2 % (ref 11.7–15.4)
WBC: 6.4 10*3/uL (ref 3.4–10.8)

## 2023-03-27 LAB — HEMOGLOBIN A1C
Est. average glucose Bld gHb Est-mCnc: 120 mg/dL
Hgb A1c MFr Bld: 5.8 % — ABNORMAL HIGH (ref 4.8–5.6)

## 2023-03-27 MED ORDER — ONDANSETRON 4 MG PO TBDP: 4.0000 mg | ORAL_TABLET | Freq: Three times a day (TID) | ORAL | 2 refills | Status: DC | PRN

## 2023-03-27 MED ORDER — FLUTICASONE-SALMETEROL 250-50 MCG/ACT IN AEPB: 1.0000 | INHALATION_SPRAY | Freq: Two times a day (BID) | RESPIRATORY_TRACT | 2 refills | Status: DC

## 2023-03-27 MED ORDER — VALACYCLOVIR HCL 1 G PO TABS: 1000.0000 mg | ORAL_TABLET | Freq: Two times a day (BID) | ORAL | 2 refills | Status: DC

## 2023-03-27 MED ORDER — AZELASTINE HCL 0.1 % NA SOLN: 2.0000 | Freq: Two times a day (BID) | NASAL | 3 refills | Status: DC

## 2023-03-27 MED ORDER — DICYCLOMINE HCL 20 MG PO TABS: ORAL_TABLET | ORAL | 3 refills | Status: DC

## 2023-03-27 NOTE — Progress Notes (Signed)
 Established Patient Office Visit  Subjective:  Patient ID: April [name], female    DOB: 1970-03-16  Age: 53 y.o. MRN: [id number]  Chief Complaint  Patient presents with   Follow-up    3 Months Follow Up    No new complaints, here for lab review and medication refills. Tilt table test was positive with differentials of atrial tachy vs SVT and will undergo EP study in a week.  Fasting glucose elevated with A1c in the prediabetic range.    No other concerns at this time.   Past Medical History:  Diagnosis Date   Anxiety 12/14/2011   Asthma    no inhaler   Cataract    Diastolic dysfunction    a. 10/2015 Echo: EF nl, mod diast dysfxn. Mild MR/TR; b. 09/2016 Echo: EF 55-60%, no rwma. Nl RV size and fxn; c. 03/2020 Echo: EF 60-65%. No rwma.   Factor 5 Leiden mutation, heterozygous (HCC)    Fibromyalgia    Fracture of 5th metatarsal 03/2011   left foot -     Heart palpitations    a. Notes intermittent tachycardia (sinus). Did not tolerate beta blockers 2/2 urinary incontinence; b. 07/2017 Event monitor: Avg HR 77 (49-136). No significant ectopy/pauses. Triggered events corresponded w/ sinus rhythm, sinus arrhythmias, and artifact.   Interstitial cystitis    Non-cardiac chest pain    a. 12/2015 CTA chest: no PE. No significant coronary Ca2+. Mosaic attenuation pattern in both lungs, may reflect small airway dzs; c. 12/2015 Ex MV: Walked 6 mins. No ischemia/scar; d. 12/2019 Cor CTA: Ca2+ = 0. Nl cors. ? PFO w/o evidence of flow (not seen on echo).   Ovarian cyst    Pancreatitis    Pelvic floor dysfunction    Rheumatoid arteritis (HCC)    Sinus infection    chronic   Syncope    a. 08/2017 Event monitor: Average heart rate 77 (49-136).  Periods of sinus arrhythmia noted.  Triggered events corresponded with sinus rhythm, sinus arrhythmia, and artifact.  No significant ectopy, sustained arrhythmias, or prolonged pauses observed.   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    Past Surgical History:  Procedure Laterality Date   ABDOMINAL HYSTERECTOMY     CHOLECYSTECTOMY     CYSTOSCOPY WITH HYDRODISTENSION AND BIOPSY  04/21/2011   Dr Lorenz Coaster   LAPAROSCOPIC ASSISTED VAGINAL HYSTERECTOMY  08/21/2011   Procedure: LAPAROSCOPIC ASSISTED VAGINAL HYSTERECTOMY;  Surgeon: Jeani Hawking, MD;  Location: [facility];  Service: Gynecology;  Laterality: N/A;  OPEN LAPAROSCOPIC   LAPAROSCOPIC NISSEN FUNDOPLICATION  2007   neck ablation Left 12/29/2019   neck ablation Right 01/05/2020   SALPINGOOPHORECTOMY  08/21/2011   Procedure: SALPINGO OOPHERECTOMY;  Surgeon: Jeani Hawking, MD;  Location: [facility];  Service: Gynecology;  Laterality: Bilateral;   TUBAL LIGATION  2010   WISDOM TOOTH EXTRACTION      Social History   Socioeconomic History   Marital status: Married    Spouse name: Not on file   Number of children: Not on file   Years of education: Not on file   Highest education level: Not on file  Occupational History   Not on file  Tobacco Use   Smoking status: Never   Smokeless tobacco: Never  Vaping Use   Vaping status: Never Used  Substance and Sexual Activity   Alcohol use: No  Drug use: No   Sexual activity: Yes    Birth control/protection: Surgical  Other Topics Concern   Not on file  Social History Narrative   Not on file   Social Drivers of Health   Financial Resource Strain: Not on file  Food Insecurity: Not on file  Transportation Needs: Not on file  Physical Activity: Not on file  Stress: Not on file  Social Connections: Unknown (05/19/2022)   Received from [facility]   Social Network    Social Network: Not on file  Intimate Partner Violence: Unknown (05/19/2022)   Received from [facility]   HITS    Physically Hurt: Not on file    Insult or Talk Down To: Not on file    Threaten Physical Harm: Not on file     Scream or Curse: Not on file    Family History  Problem Relation Age of Onset   Heart Problems Mother    Vascular Disease Mother    Skin cancer Mother    Vascular Disease Maternal Grandmother    Kidney Stones Father    Skin cancer Maternal Uncle    Anesthesia problems Neg Hx     Allergies  Allergen Reactions   Amitriptyline Other (See Comments)    Other reaction(Emile Kyllo): Other  Change in mental status   Amoxicillin Rash   Bromelains     Other reaction(Omid Deardorff): Abdominal pain   Celexa [Citalopram Hydrobromide] Other (See Comments)    Other reaction(Tiaja Hagan): Other Change in mental status   Nortriptyline     Other reaction(Neetu Carrozza): Other   Penicillins Swelling and Rash   Pregabalin     Other reaction(Esaiah Wanless): Myalgias (muscle pain)   Bromelains [Pineapple Extract] Other (See Comments)    Severe pain   Budesonide-Formoterol Fumarate     Other reaction(Archita Lomeli): Other   Citalopram Other (See Comments)   Mangifera Indica Swelling   Methylene Blue Other (See Comments)    Caused inflamed pancreas, per pt should not use   Sertraline Other (See Comments)    Other reaction(Fahim Kats): Other Suicidal thoughts   Sulfa Antibiotics Other (See Comments)   Sulfonamide Derivatives Other (See Comments)    excrutiating pain, patient states it caused her to have osteoarthritis   Xarelto [Rivaroxaban]    Bromaline [[employer] Di Bromm] Rash   Other Rash    Mango    Outpatient Medications Prior to Visit  Medication Sig   Acetaminophen (TYLENOL PO) Take by mouth as needed.   acyclovir ointment (ZOVIRAX) 5 % Apply thin layer to affected area 4-6 times per day   albuterol (VENTOLIN HFA) 108 (90 Base) MCG/ACT inhaler USE AS DIRECTED   Cholecalciferol (VITAMIN D3) 5000 units TABS Take by mouth daily.   Coenzyme Q10 (CO Q 10 PO) Take 200 mg by mouth daily.   [employer] EVENING PRIMROSE OIL PO Take by mouth 2 (two) times daily.   Cyanocobalamin (VITAMIN B-12) 5000 MCG SUBL Take 5,000 mcg by mouth daily.   cyclobenzaprine  (FLEXERIL) 10 MG tablet TAKE 1 TABLET BY MOUTH 3 TIMES DAILY   diazepam (VALIUM) 10 MG tablet Take 10 mg by mouth daily.   diltiazem (CARDIZEM CD) 120 MG 24 hr capsule Take 1 capsule (120 mg total) by mouth with breakfast, with lunch, and with evening meal.   diltiazem (CARDIZEM) 30 MG tablet TAKE 1 TABLET BY MOUTH EVERY 8 HOURS AS NEEDED FOR BREAKTHROUGH PALPITATIONS AS DIRECTED.   ELMIRON 100 MG capsule Take 200 mg by mouth 2 (two) times  daily.   estradiol (ESTRACE) 0.1 MG/GM vaginal cream pea sized amount to external vagina 2 times weekly   fexofenadine (ALLEGRA) 180 MG tablet Take 180 mg by mouth every morning.   fluticasone (FLONASE) 50 MCG/ACT nasal spray USE 2 SPRAYS IN EACH NOSTRIL ONCE DAILY AS DIRECTED   furosemide (LASIX) 20 MG tablet TAKE 1 TABLET BY MOUTH DAILY.   gabapentin (NEURONTIN) 800 MG tablet Take 800 mg by mouth 3 (three) times daily.   HYDROcodone-acetaminophen (NORCO/VICODIN) 5-325 MG tablet Take by mouth.   hydrOXYzine (ATARAX/VISTARIL) 50 MG tablet Take 50 mg by mouth at bedtime.   hyoscyamine (LEVSIN SL) 0.125 MG SL tablet DISSOLVE 1 TABLET UNDER TONGUE EVERY 4 HOURS AS NEEDED FOR CRAMPING   MAY USE BY MOUTH OR UNDER THE TONGUE   IBUPROFEN PO Take by mouth as needed.   Lactobacillus (ACIDOPHILUS PROBIOTIC PO) Take by mouth 3 (three) times daily.   lidocaine (XYLOCAINE) 5 % ointment Apply topically daily.   methocarbamol (ROBAXIN) 750 MG tablet Take 750 mg by mouth every 8 (eight) hours.   montelukast (SINGULAIR) 10 MG tablet Take 10 mg by mouth at bedtime.   Multiple Vitamins-Minerals (MULTIVITAMIN ADULTS PO) Take 1 tablet by mouth daily.   nitrofurantoin (MACRODANTIN) 50 MG capsule Take 50 mg by mouth daily.   oxybutynin (DITROPAN) 5 MG tablet Take 5 mg by mouth 3 (three) times daily as needed.   PROCTOZONE-HC 2.5 % rectal cream Apply topically as needed.   pseudoephedrine (SUDAFED) 30 MG tablet Take 30 mg by mouth every 6 (six) hours as needed.   Simethicone 125 MG  CAPS Take 125 mg by mouth 4 (four) times daily.   Zinc 50 MG TABS Take 50 mg by mouth daily.   [DISCONTINUED] azelastine (ASTELIN) 0.1 % nasal spray Place 2 sprays into both nostrils 2 (two) times daily.   [DISCONTINUED] dicyclomine (BENTYL) 20 MG tablet TAKE 1 TABLET BY MOUTH FOUR TIMES DAILY AS DIRECTED   [DISCONTINUED] fluticasone-salmeterol (ADVAIR) 250-50 MCG/ACT AEPB INHALE 1 PUFF TWICE DAILY AS DIRECTED. *RINSE MOUTH AFTER USE*   [DISCONTINUED] valACYclovir (VALTREX) 1000 MG tablet TAKE ONE TABLET (1000 MG) BY MOUTH TWICEDAILY   No facility-administered medications prior to visit.    Review of Systems  Constitutional: Negative.   HENT: Negative.    Eyes: Negative.   Respiratory: Negative.    Cardiovascular: Negative.   Gastrointestinal: Negative.   Genitourinary: Negative.   Skin: Negative.   Neurological: Negative.   Endo/Heme/Allergies: Negative.        Objective:   BP 132/70   Pulse 98   Temp 98.7 F (37.1 C) (Tympanic)   Ht 5\' 5"  (1.651 m)   Wt 197 lb (89.4 kg)   LMP 05/20/2011   SpO2 99%   BMI 32.78 kg/m   Vitals:   03/27/23 1506  BP: 132/70  Pulse: 98  Temp: 98.7 F (37.1 C)  Height: 5\' 5"  (1.651 m)  Weight: 197 lb (89.4 kg)  SpO2: 99%  TempSrc: Tympanic  BMI (Calculated): 32.78    Physical Exam Vitals reviewed.  Constitutional:      General: She is not in acute distress. HENT:     Head: Normocephalic.     Nose: Nose normal.     Mouth/Throat:     Mouth: Mucous membranes are moist.  Eyes:     Extraocular Movements: Extraocular movements intact.     Pupils: Pupils are equal, round, and reactive to light.  Neck:     Vascular: No carotid bruit.  Cardiovascular:     Rate and Rhythm: Normal rate and regular rhythm.     Heart sounds: No murmur heard. Pulmonary:     Effort: Pulmonary effort is normal.     Breath sounds: No rhonchi or rales.  Abdominal:     General: Bowel sounds are normal.     Palpations: Abdomen is soft. There is no  hepatomegaly, splenomegaly or mass.     Comments: Stimulator in right gluteus  Musculoskeletal:        General: Normal range of motion.     Cervical back: Normal range of motion. No tenderness.  Skin:    General: Skin is warm and dry.  Neurological:     General: No focal deficit present.     Mental Status: She is alert and oriented to person, place, and time.     Cranial Nerves: No cranial nerve deficit.     Motor: No weakness.  Psychiatric:        Mood and Affect: Mood normal.        Behavior: Behavior normal.      No results found for any visits on 03/27/23.  Recent Results (from the past 2160 hours)  Comprehensive metabolic panel     Status: Abnormal   Collection Time: 03/26/23  9:20 AM  Result Value Ref Range   Glucose 101 (H) 70 - 99 mg/dL   BUN 6 6 - 24 mg/dL   Creatinine, Ser 0.86 0.57 - 1.00 mg/dL   eGFR 92 >57 QI/ONG/2.95   BUN/Creatinine Ratio 8 (L) 9 - 23   Sodium 141 134 - 144 mmol/L   Potassium 4.6 3.5 - 5.2 mmol/L   Chloride 102 96 - 106 mmol/L   CO2 24 20 - 29 mmol/L   Calcium 9.7 8.7 - 10.2 mg/dL   Total Protein 7.4 6.0 - 8.5 g/dL   Albumin 4.7 3.8 - 4.9 g/dL   Globulin, Total 2.7 1.5 - 4.5 g/dL   Bilirubin Total 0.2 0.0 - 1.2 mg/dL   Alkaline Phosphatase 54 44 - 121 IU/L   AST 13 0 - 40 IU/L   ALT 16 0 - 32 IU/L  CBC With Diff/Platelet     Status: Abnormal   Collection Time: 03/26/23  9:20 AM  Result Value Ref Range   WBC 6.4 3.4 - 10.8 x10E3/uL   RBC 3.83 3.77 - 5.28 x10E6/uL   Hemoglobin 13.2 11.1 - 15.9 g/dL   Hematocrit 28.4 13.2 - 46.6 %   MCV 97 79 - 97 fL   MCH 34.5 (H) 26.6 - 33.0 pg   MCHC 35.5 31.5 - 35.7 g/dL   RDW 44.0 10.2 - 72.5 %   Platelets 348 150 - 450 x10E3/uL   Neutrophils 55 Not Estab. %   Lymphs 34 Not Estab. %   Monocytes 8 Not Estab. %   Eos 2 Not Estab. %   Basos 1 Not Estab. %   Neutrophils Absolute 3.6 1.4 - 7.0 x10E3/uL   Lymphocytes Absolute 2.2 0.7 - 3.1 x10E3/uL   Monocytes Absolute 0.5 0.1 - 0.9 x10E3/uL    EOS (ABSOLUTE) 0.1 0.0 - 0.4 x10E3/uL   Basophils Absolute 0.1 0.0 - 0.2 x10E3/uL   Immature Granulocytes 0 Not Estab. %   Immature Grans (Abs) 0.0 0.0 - 0.1 x10E3/uL  Lipid panel     Status: None   Collection Time: 03/26/23  9:26 AM  Result Value Ref Range   Cholesterol, Total 187 100 - 199 mg/dL   Triglycerides 91 0 -  149 mg/dL   HDL 78 >29 mg/dL   VLDL Cholesterol Cal 16 5 - 40 mg/dL   LDL Chol Calc (NIH) 93 0 - 99 mg/dL   Chol/HDL Ratio 2.4 0.0 - 4.4 ratio    Comment:                                   T. Chol/HDL Ratio                                             Men  Women                               1/2 Avg.Risk  3.4    3.3                                   Avg.Risk  5.0    4.4                                2X Avg.Risk  9.6    7.1                                3X Avg.Risk 23.4   11.0   Hemoglobin A1c     Status: Abnormal   Collection Time: 03/26/23  9:26 AM  Result Value Ref Range   Hgb A1c MFr Bld 5.8 (H) 4.8 - 5.6 %    Comment:          Prediabetes: 5.7 - 6.4          Diabetes: >6.4          Glycemic control for adults with diabetes: <7.0    Est. average glucose Bld gHb Est-mCnc 120 mg/dL      Assessment & Plan:  As per problem list. Stricter low calorie diet, low cholesterol and low fat diet and exercise as much as possible.   Problem List Items Addressed This Visit       Other   Prediabetes   Relevant Orders   Hemoglobin A1c   Obesity (BMI 30-39.9)   Relevant Orders   CBC With Diff/Platelet   Comprehensive metabolic panel   Lipid panel   Other Visit Diagnoses       Nausea    -  Primary   Relevant Medications   ondansetron (ZOFRAN-ODT) 4 MG disintegrating tablet     Paroxysmal tachycardia (HCC)         Other irritable bowel syndrome       Relevant Medications   ondansetron (ZOFRAN-ODT) 4 MG disintegrating tablet   dicyclomine (BENTYL) 20 MG tablet       Return in about 3 months (around 06/24/2023) for cpe with labs prior no pap.   Total time  spent: 20 minutes  Luna Fuse, MD  03/27/2023   This document may have been prepared by [facility] Voice Recognition software and as such may include unintentional dictation errors.

## 2023-04-02 DIAGNOSIS — M6289 Other specified disorders of muscle: Secondary | ICD-10-CM | POA: Diagnosis not present

## 2023-04-02 DIAGNOSIS — N3010 Interstitial cystitis (chronic) without hematuria: Secondary | ICD-10-CM | POA: Diagnosis not present

## 2023-04-02 DIAGNOSIS — K580 Irritable bowel syndrome with diarrhea: Secondary | ICD-10-CM | POA: Diagnosis not present

## 2023-04-19 DIAGNOSIS — I10 Essential (primary) hypertension: Secondary | ICD-10-CM | POA: Diagnosis not present

## 2023-04-19 DIAGNOSIS — Z882 Allergy status to sulfonamides status: Secondary | ICD-10-CM | POA: Diagnosis not present

## 2023-04-19 DIAGNOSIS — K589 Irritable bowel syndrome without diarrhea: Secondary | ICD-10-CM | POA: Diagnosis not present

## 2023-04-19 DIAGNOSIS — M069 Rheumatoid arthritis, unspecified: Secondary | ICD-10-CM | POA: Diagnosis not present

## 2023-04-19 DIAGNOSIS — M797 Fibromyalgia: Secondary | ICD-10-CM | POA: Diagnosis not present

## 2023-04-19 DIAGNOSIS — Z79891 Long term (current) use of opiate analgesic: Secondary | ICD-10-CM | POA: Diagnosis not present

## 2023-04-19 DIAGNOSIS — Z880 Allergy status to penicillin: Secondary | ICD-10-CM | POA: Diagnosis not present

## 2023-04-19 DIAGNOSIS — D649 Anemia, unspecified: Secondary | ICD-10-CM | POA: Diagnosis not present

## 2023-04-19 DIAGNOSIS — I471 Supraventricular tachycardia, unspecified: Secondary | ICD-10-CM | POA: Diagnosis not present

## 2023-04-19 DIAGNOSIS — D6851 Activated protein C resistance: Secondary | ICD-10-CM | POA: Diagnosis not present

## 2023-04-19 DIAGNOSIS — G894 Chronic pain syndrome: Secondary | ICD-10-CM | POA: Diagnosis not present

## 2023-04-19 DIAGNOSIS — Z86718 Personal history of other venous thrombosis and embolism: Secondary | ICD-10-CM | POA: Diagnosis not present

## 2023-04-19 DIAGNOSIS — J45909 Unspecified asthma, uncomplicated: Secondary | ICD-10-CM | POA: Diagnosis not present

## 2023-04-19 DIAGNOSIS — I4719 Other supraventricular tachycardia: Secondary | ICD-10-CM | POA: Diagnosis not present

## 2023-04-20 DIAGNOSIS — G894 Chronic pain syndrome: Secondary | ICD-10-CM | POA: Diagnosis not present

## 2023-04-20 DIAGNOSIS — Z79891 Long term (current) use of opiate analgesic: Secondary | ICD-10-CM | POA: Diagnosis not present

## 2023-04-20 DIAGNOSIS — M069 Rheumatoid arthritis, unspecified: Secondary | ICD-10-CM | POA: Diagnosis not present

## 2023-04-20 DIAGNOSIS — I10 Essential (primary) hypertension: Secondary | ICD-10-CM | POA: Diagnosis not present

## 2023-04-20 DIAGNOSIS — D649 Anemia, unspecified: Secondary | ICD-10-CM | POA: Diagnosis not present

## 2023-04-20 DIAGNOSIS — D6851 Activated protein C resistance: Secondary | ICD-10-CM | POA: Diagnosis not present

## 2023-04-20 DIAGNOSIS — J45909 Unspecified asthma, uncomplicated: Secondary | ICD-10-CM | POA: Diagnosis not present

## 2023-04-20 DIAGNOSIS — Z86718 Personal history of other venous thrombosis and embolism: Secondary | ICD-10-CM | POA: Diagnosis not present

## 2023-04-20 DIAGNOSIS — I4719 Other supraventricular tachycardia: Secondary | ICD-10-CM | POA: Diagnosis not present

## 2023-04-20 DIAGNOSIS — M797 Fibromyalgia: Secondary | ICD-10-CM | POA: Diagnosis not present

## 2023-04-20 DIAGNOSIS — K589 Irritable bowel syndrome without diarrhea: Secondary | ICD-10-CM | POA: Diagnosis not present

## 2023-04-20 DIAGNOSIS — Z882 Allergy status to sulfonamides status: Secondary | ICD-10-CM | POA: Diagnosis not present

## 2023-04-20 DIAGNOSIS — Z880 Allergy status to penicillin: Secondary | ICD-10-CM | POA: Diagnosis not present

## 2023-05-08 DIAGNOSIS — I5030 Unspecified diastolic (congestive) heart failure: Secondary | ICD-10-CM | POA: Diagnosis not present

## 2023-05-08 DIAGNOSIS — Z136 Encounter for screening for cardiovascular disorders: Secondary | ICD-10-CM | POA: Diagnosis not present

## 2023-05-08 DIAGNOSIS — R0789 Other chest pain: Secondary | ICD-10-CM | POA: Diagnosis not present

## 2023-05-08 DIAGNOSIS — Z86718 Personal history of other venous thrombosis and embolism: Secondary | ICD-10-CM | POA: Diagnosis not present

## 2023-05-08 DIAGNOSIS — K219 Gastro-esophageal reflux disease without esophagitis: Secondary | ICD-10-CM | POA: Diagnosis not present

## 2023-05-08 DIAGNOSIS — I4719 Other supraventricular tachycardia: Secondary | ICD-10-CM | POA: Diagnosis not present

## 2023-05-08 DIAGNOSIS — D649 Anemia, unspecified: Secondary | ICD-10-CM | POA: Diagnosis not present

## 2023-05-08 DIAGNOSIS — I471 Supraventricular tachycardia, unspecified: Secondary | ICD-10-CM | POA: Diagnosis not present

## 2023-05-08 DIAGNOSIS — J45909 Unspecified asthma, uncomplicated: Secondary | ICD-10-CM | POA: Diagnosis not present

## 2023-05-17 ENCOUNTER — Other Ambulatory Visit: Payer: Self-pay | Admitting: Internal Medicine

## 2023-06-01 DIAGNOSIS — I4719 Other supraventricular tachycardia: Secondary | ICD-10-CM | POA: Diagnosis not present

## 2023-06-01 DIAGNOSIS — I499 Cardiac arrhythmia, unspecified: Secondary | ICD-10-CM | POA: Diagnosis not present

## 2023-06-07 ENCOUNTER — Other Ambulatory Visit: Payer: Self-pay | Admitting: Internal Medicine

## 2023-06-11 ENCOUNTER — Other Ambulatory Visit: Payer: Self-pay | Admitting: Internal Medicine

## 2023-06-27 ENCOUNTER — Other Ambulatory Visit: Payer: Self-pay | Admitting: Internal Medicine

## 2023-06-27 DIAGNOSIS — B029 Zoster without complications: Secondary | ICD-10-CM

## 2023-07-02 DIAGNOSIS — N3010 Interstitial cystitis (chronic) without hematuria: Secondary | ICD-10-CM | POA: Diagnosis not present

## 2023-07-03 ENCOUNTER — Encounter: Payer: 59 | Admitting: Internal Medicine

## 2023-07-05 ENCOUNTER — Ambulatory Visit: Payer: 59 | Admitting: Cardiology

## 2023-07-05 NOTE — Progress Notes (Deleted)
 Cardiology Office Note   Date:  07/05/2023  ID:  April [name], DOB Sep 11, 1970, MRN [id number] PCP: Shari Daughters, MD  [facility] Providers Cardiologist:  Sammy Crisp, MD Electrophysiologist:  Boyce Byes, MD { Click to update primary MD,subspecialty MD or APP then REFRESH:1}    History of Present Illness April [name] is a 53 y.o. female with past medical history of gonadal vein thrombosis, fibromyalgia, factor V Leiden mutation previously on rivaroxaban  which was discontinued for severe anemia, bladder stimulator, diastolic dysfunction, anemia, and atypical chest pain, who presents today for follow-up.   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, previously she had been placed on Xarelto  but that had to be discontinued in the setting of severe anemia.  Echocardiogram completed September 2016 showed normal LV function with moderate diastolic dysfunction.  November 2017 gentleman stress testing for atypical chest pain which did not show any evidence of ischemia or scar.  She had a history of palpitations for which she was previously on beta-blocker therapy (metoprolol  followed by carvedilol ).  Beta-blocker therapy worsened urinary incontinence she has been managed on calcium channel blocker diltiazem  since that time.  Follow-up echocardiogram in September 2018 showed stable normal LV function without mention of diastolic dysfunction.  Most recent echocardiogram in 03/2020 showed an EF of 60 to 65% with normal diastolic parameters.  Coronary CT 2021 revealed a coronary calcium score of 0 with normal coronary arteries.  There was question of PFO that this was apparently not felt to be significant but structural heart team and was not seen on echocardiogram in February 2022.  She was evaluated by EP Dr. Rodolfo Clan in 12/2020 for POTS evaluation.  It was determined she did not have POTS as her orthostatic  vital signs showed no evidence of tachycardia.  She then presented to the [facility] emergency department in 05/2021 for palpitations, headache and chest pain.  Workup was overall reassuring although she was noted to have mild hyponatremia and hypokalemia.  She was evaluated by Dr. Lonny Robertson for more frequent palpitations.  At that time Dr. Hilliard Loyal noted she did not have a diagnosis of heart failure and given her recent hyponatremia was recommended that she discontinue furosemide .  However 1 month follow-up she had restarted Lasix  following a 10 pound weight gain in 4 days.  Some the importance of staying well-hydrated and maintaining normal electrolytes is reinforced.  She was seen in February 2024 reporting increased palpitations and episodes of headache and chest discomfort.  Her EKG showed normal sinus rhythm.  Her diltiazem  was increased to 20 mg twice daily with 30 mg for breakthrough palpitations.  She was evaluated by his emergency department atypical chest pain in April 2024.  It was reproducible with palpation and her cardiac workup was unremarkable.  D-dimer was negative and high-sensitivity troponin was negative x 2.  She was seen in clinic 12/26/2022 with continued concerns of palpitations that required an increased amount of her as needed diltiazem .  She had complaints of chest pain that was not only on exertion but random with rest.  She was discussed in her previous visit with EP where she was advised that she did not have POTS.  She also stated that she had a syncopal episode at church.  She was scheduled for coronary CTA and carotid duplex the previously been ordered by her PCP.  There were no further medication changes that were made at that time.  She was last seen in clinic 03/07/2023 stating overall she had been doing well.  She continued palpitations with atypical chest discomfort as before for.  She had also subsequently been referred to EP in [facility].  She was getting ready to undergo EP  studies with possible ablation for her atrial tachycardia versus SVT.  She did undergo a tilt test study which was negative for POTS.  She denied any further syncopal episodes.  She was encouraged to continue to follow-up with EP in [facility].  There were no medication changes that were made or further testing that was ordered from our standpoint.  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  This was found during her EP study.  She continues to report palpitations, lightheadedness and dizziness.  She was advised that it takes approximately 90 days to get a good result from the ablation procedure.  They discussed repeating a ZIO monitor 3 to 6 months post ablation.  Her diltiazem  was reduced to 240 mg daily as they were working to get her off of diltiazem  and eventually initiation of ivabradine.    ROS: ***  Studies Reviewed      cCTA 01/03/2023 IMPRESSION: 1. Coronary calcium score of 0.   2. Normal coronary origin with right dominance.   3. No evidence of CAD.   4. CAD-RADS 0. Consider non-atherosclerotic causes of chest pain.   2D echo 10/17/2022 1. Left ventricular ejection fraction, by estimation, is 60 to 65%. The  left ventricle has normal function. The left ventricle has no regional  wall motion abnormalities. Left ventricular diastolic parameters were  normal. The average left ventricular  global longitudinal strain is -18.6 %.   2. Right ventricular systolic function is normal. The right ventricular  size is normal. Tricuspid regurgitation signal is inadequate for assessing  PA pressure.   3. The mitral valve is normal in structure. No evidence of mitral valve  regurgitation. No evidence of mitral stenosis.   4. The aortic valve has an indeterminant number of cusps. Aortic valve  regurgitation is not visualized. Aortic valve sclerosis is present, with  no evidence of aortic valve stenosis.    5. The inferior vena cava is normal in size with greater than 50%  respiratory variability, suggesting right atrial pressure of 3 mmHg.    Event Monitoring (Zio) 10/13/2022   The patient was monitored for 14 days.   The predominant rhythm was sinus with an average rate of 91 bpm in sinus (range 48-139 bpm in sinus).   There were rare PAC's and PVC's.   13 supraventricular runs occurred, lasting up to 7.4 seconds with a maximum rate of 210 bpm.   No sustained arrhythmia or prolonged pause was observed.   There were 71 patient triggered and 18 diary events, most of which correspond to sinus rhythm.  A few events include PAC's and PSVT.   Predominantly sinus rhythm with brief PSVT, as detailed above, and rare PAC's and PVC's.   2D echo 03/12/2020 1. Left ventricular ejection fraction, by estimation, is 60 to 65%. The  left ventricle has normal function. The left ventricle has no regional  wall motion abnormalities. Left ventricular diastolic parameters were  normal.   2. Right ventricular systolic function is normal. The right ventricular  size is normal.   3. The inferior vena cava is dilated in size with >50% respiratory  variability, suggesting right atrial pressure  of 8 mmHg.    Coronary CTA  12/18/2019 IMPRESSION: 1. No evidence of CAD, CADRADS = 0.   2. Coronary calcium score of 0. This was 0 percentile for age and sex matched control.   3. Normal coronary origin with left dominance. Risk Assessment/Calculations {Does this patient have ATRIAL FIBRILLATION?:[phone]} No BP recorded.  {Refresh Note OR Click here to enter BP  :1}***       Physical Exam VS:  LMP 05/20/2011    Wt Readings from Last 3 Encounters:  03/27/23 197 lb (89.4 kg)  03/07/23 199 lb 12.8 oz (90.6 kg)  12/26/22 200 lb 9.6 oz (91 kg)    GEN: Well nourished, well developed in no acute distress NECK: No JVD; No carotid bruits CARDIAC: ***RRR, no murmurs, rubs, gallops RESPIRATORY:  Clear to  auscultation without rales, wheezing or rhonchi  ABDOMEN: Soft, non-tender, non-distended EXTREMITIES:  No edema; No deformity   ASSESSMENT AND PLAN ***    {Are you ordering a CV Procedure (e.g. stress test, cath, DCCV, TEE, etc)?   Press F2        :[id number]}  Dispo: ***  Signed, Lynnae Ludemann, NP

## 2023-07-11 ENCOUNTER — Other Ambulatory Visit

## 2023-07-11 DIAGNOSIS — R7303 Prediabetes: Secondary | ICD-10-CM | POA: Diagnosis not present

## 2023-07-12 LAB — HEMOGLOBIN A1C
Est. average glucose Bld gHb Est-mCnc: 111 mg/dL
Hgb A1c MFr Bld: 5.5 % (ref 4.8–5.6)

## 2023-07-12 LAB — COMPREHENSIVE METABOLIC PANEL WITH GFR
ALT: 15 IU/L (ref 0–32)
AST: 16 IU/L (ref 0–40)
Albumin: 4.3 g/dL (ref 3.8–4.9)
Alkaline Phosphatase: 61 IU/L (ref 44–121)
BUN/Creatinine Ratio: 8 — ABNORMAL LOW (ref 9–23)
BUN: 7 mg/dL (ref 6–24)
Bilirubin Total: <0.2 mg/dL (ref 0.0–1.2)
CO2: 22 mmol/L (ref 20–29)
Calcium: 9.4 mg/dL (ref 8.7–10.2)
Chloride: 102 mmol/L (ref 96–106)
Creatinine, Ser: 0.83 mg/dL (ref 0.57–1.00)
Globulin, Total: 2.4 g/dL (ref 1.5–4.5)
Glucose: 93 mg/dL (ref 70–99)
Potassium: 4.6 mmol/L (ref 3.5–5.2)
Sodium: 138 mmol/L (ref 134–144)
Total Protein: 6.7 g/dL (ref 6.0–8.5)
eGFR: 85 mL/min/{1.73_m2} (ref >59)

## 2023-07-12 LAB — LIPID PANEL
Chol/HDL Ratio: 2.5 ratio (ref 0.0–4.4)
Cholesterol, Total: 186 mg/dL (ref 100–199)
HDL: 75 mg/dL (ref >39)
LDL Chol Calc (NIH): 87 mg/dL (ref 0–99)
Triglycerides: 144 mg/dL (ref 0–149)
VLDL Cholesterol Cal: 24 mg/dL (ref 5–40)

## 2023-07-12 LAB — CBC WITH DIFF/PLATELET
Basophils Absolute: 0.1 10*3/uL (ref 0.0–0.2)
Basos: 1 %
EOS (ABSOLUTE): 0.2 10*3/uL (ref 0.0–0.4)
Eos: 3 %
Hematocrit: 40.5 % (ref 34.0–46.6)
Hemoglobin: 13.2 g/dL (ref 11.1–15.9)
Immature Grans (Abs): 0 10*3/uL (ref 0.0–0.1)
Immature Granulocytes: 0 %
Lymphocytes Absolute: 2.2 10*3/uL (ref 0.7–3.1)
Lymphs: 31 %
MCH: 33.2 pg — ABNORMAL HIGH (ref 26.6–33.0)
MCHC: 32.6 g/dL (ref 31.5–35.7)
MCV: 102 fL — ABNORMAL HIGH (ref 79–97)
Monocytes Absolute: 0.7 10*3/uL (ref 0.1–0.9)
Monocytes: 9 %
Neutrophils Absolute: 4 10*3/uL (ref 1.4–7.0)
Neutrophils: 56 %
Platelets: 287 10*3/uL (ref 150–450)
RBC: 3.98 x10E6/uL (ref 3.77–5.28)
RDW: 12.6 % (ref 11.7–15.4)
WBC: 7.2 10*3/uL (ref 3.4–10.8)

## 2023-07-13 ENCOUNTER — Ambulatory Visit: Payer: Self-pay | Admitting: Internal Medicine

## 2023-07-13 ENCOUNTER — Ambulatory Visit (INDEPENDENT_AMBULATORY_CARE_PROVIDER_SITE_OTHER): Admitting: Internal Medicine

## 2023-07-13 VITALS — BP 118/90 | HR 106 | Temp 98.2°F | Ht 65.0 in | Wt 197.6 lb

## 2023-07-13 DIAGNOSIS — J301 Allergic rhinitis due to pollen: Secondary | ICD-10-CM

## 2023-07-13 DIAGNOSIS — K588 Other irritable bowel syndrome: Secondary | ICD-10-CM

## 2023-07-13 DIAGNOSIS — Z1331 Encounter for screening for depression: Secondary | ICD-10-CM

## 2023-07-13 DIAGNOSIS — M797 Fibromyalgia: Secondary | ICD-10-CM | POA: Diagnosis not present

## 2023-07-13 DIAGNOSIS — Z0001 Encounter for general adult medical examination with abnormal findings: Secondary | ICD-10-CM

## 2023-07-13 DIAGNOSIS — Z0130 Encounter for examination of blood pressure without abnormal findings: Secondary | ICD-10-CM

## 2023-07-13 DIAGNOSIS — R7303 Prediabetes: Secondary | ICD-10-CM

## 2023-07-13 MED ORDER — DICYCLOMINE HCL 20 MG PO TABS: ORAL_TABLET | ORAL | 3 refills | Status: DC

## 2023-07-13 MED ORDER — FLUTICASONE-SALMETEROL 250-50 MCG/ACT IN AEPB: 1.0000 | INHALATION_SPRAY | Freq: Two times a day (BID) | RESPIRATORY_TRACT | 2 refills | Status: DC

## 2023-07-13 MED ORDER — ALBUTEROL SULFATE HFA 108 (90 BASE) MCG/ACT IN AERS: 1.0000 | INHALATION_SPRAY | Freq: Four times a day (QID) | RESPIRATORY_TRACT | 5 refills | Status: AC | PRN

## 2023-07-13 MED ORDER — AZELASTINE HCL 0.1 % NA SOLN: 2.0000 | Freq: Two times a day (BID) | NASAL | 3 refills | Status: AC

## 2023-07-13 MED ORDER — FLUTICASONE PROPIONATE 50 MCG/ACT NA SUSP: 1.0000 | Freq: Every day | NASAL | 2 refills | Status: DC

## 2023-07-13 NOTE — Progress Notes (Signed)
 Established Patient Office Visit  Subjective:  Patient ID: April [name], female    DOB: [birth year]  Age: 53 y.o. MRN: [id number]  Chief Complaint  Patient presents with   Annual Exam    CPE No PAP    No new complaints, here for lab review and medication refills. Recently underwent ablation procedure. Labs reviewed and notable for well controlled lipids, unremarkable cmp while cbc notable for stable macrocytosis and hyperchromia.     No other concerns at this time.   Past Medical History:  Diagnosis Date   Anxiety 12/14/2011   Asthma    no inhaler   Cataract    Diastolic dysfunction    a. 10/2015 Echo: EF nl, mod diast dysfxn. Mild MR/TR; b. 09/2016 Echo: EF 55-60%, no rwma. Nl RV size and fxn; c. 03/2020 Echo: EF 60-65%. No rwma.   Factor 5 Leiden mutation, heterozygous (HCC)    Fibromyalgia    Fracture of 5th metatarsal 03/2011   left foot -     Heart palpitations    a. Notes intermittent tachycardia (sinus). Did not tolerate beta blockers 2/2 urinary incontinence; b. 07/2017 Event monitor: Avg HR 77 (49-136). No significant ectopy/pauses. Triggered events corresponded w/ sinus rhythm, sinus arrhythmias, and artifact.   Interstitial cystitis    Non-cardiac chest pain    a. 12/2015 CTA chest: no PE. No significant coronary Ca2+. Mosaic attenuation pattern in both lungs, may reflect small airway dzs; c. 12/2015 Ex MV: Walked 6 mins. No ischemia/scar; d. 12/2019 Cor CTA: Ca2+ = 0. Nl cors. ? PFO w/o evidence of flow (not seen on echo).   Ovarian cyst    Pancreatitis    Pelvic floor dysfunction    Rheumatoid arteritis (HCC)    Sinus infection    chronic   Syncope    a. 08/2017 Event monitor: Average heart rate 77 (49-136).  Periods of sinus arrhythmia noted.  Triggered events corresponded with sinus rhythm, sinus arrhythmia, and artifact.  No significant ectopy, sustained arrhythmias, or prolonged pauses observed.   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    Past Surgical History:  Procedure Laterality Date   ABDOMINAL HYSTERECTOMY     CHOLECYSTECTOMY     CYSTOSCOPY WITH HYDRODISTENSION AND BIOPSY  04/21/2011   Dr Brendan Call   LAPAROSCOPIC ASSISTED VAGINAL HYSTERECTOMY  08/21/2011   Procedure: LAPAROSCOPIC ASSISTED VAGINAL HYSTERECTOMY;  Surgeon: Martine Sleek, MD;  Location: [facility];  Service: Gynecology;  Laterality: N/A;  OPEN LAPAROSCOPIC   LAPAROSCOPIC NISSEN FUNDOPLICATION  2007   neck ablation Left 12/29/2019   neck ablation Right 01/05/2020   SALPINGOOPHORECTOMY  08/21/2011   Procedure: SALPINGO OOPHERECTOMY;  Surgeon: Martine Sleek, MD;  Location: [facility];  Service: Gynecology;  Laterality: Bilateral;   TUBAL LIGATION  2010   WISDOM TOOTH EXTRACTION      Social History   Socioeconomic History   Marital status: Married    Spouse name: Not on file   Number of children: Not on file   Years of education: Not on file   Highest education level: Not on file  Occupational History   Not on file  Tobacco Use   Smoking status: Never   Smokeless tobacco: Never  Vaping Use   Vaping status: Never Used  Substance and Sexual Activity   Alcohol use: No   Drug use: No   Sexual  activity: Yes    Birth control/protection: Surgical  Other Topics Concern   Not on file  Social History Narrative   Not on file   Social Drivers of Health   Financial Resource Strain: Not on file  Food Insecurity: Low Risk  (04/19/2023)   Received from [facility]   Hunger Vital Sign    Within the past 12 months, you worried that your food would run out before you got money to buy more: Never true    Within the past 12 months, the food you bought just didn't last and you didn't have money to get more. : Never true  Transportation Needs: No Transportation Needs (04/19/2023)   Received from [facility]    In the past 12 months, has lack of  reliable transportation kept you from medical appointments, meetings, work or from getting things needed for daily living? : No  Physical Activity: Not on file  Stress: Not on file  Social Connections: Unknown (05/19/2022)   Received from [facility]   Social Network    Social Network: Not on file  Intimate Partner Violence: Unknown (05/19/2022)   Received from [facility]   HITS    Physically Hurt: Not on file    Insult or Talk Down To: Not on file    Threaten Physical Harm: Not on file    Scream or Curse: Not on file    Family History  Problem Relation Age of Onset   Heart Problems Mother    Vascular Disease Mother    Skin cancer Mother    Vascular Disease Maternal Grandmother    Kidney Stones Father    Skin cancer Maternal Uncle    Anesthesia problems Neg Hx     Allergies  Allergen Reactions   Amitriptyline Other (See Comments)    Other reaction(Jordyne Poehlman): Other  Change in mental status   Amoxicillin Rash   Bromelains     Other reaction(Alivea Gladson): Abdominal pain   Celexa [Citalopram Hydrobromide] Other (See Comments)    Other reaction(Estell Puccini): Other Change in mental status   Nortriptyline     Other reaction(Silas Muff): Other   Penicillins Swelling and Rash   Pregabalin     Other reaction(Reginald Mangels): Myalgias (muscle pain)   Bromelains [Pineapple Extract] Other (See Comments)    Severe pain   Budesonide-Formoterol Fumarate     Other reaction(Shameria Trimarco): Other   Citalopram Other (See Comments)   Mangifera Indica Swelling   Methylene Blue Other (See Comments)    Caused inflamed pancreas, per pt should not use   Sertraline Other (See Comments)    Other reaction(Capucine Tryon): Other Suicidal thoughts   Sulfa Antibiotics Other (See Comments)   Sulfonamide Derivatives Other (See Comments)    excrutiating pain, patient states it caused her to have osteoarthritis   Xarelto  [Rivaroxaban ]    Bromaline Cher Cordial Di Bromm] Rash   Other Rash    Mango    Outpatient Medications Prior to Visit  Medication Sig    Acetaminophen  (TYLENOL  PO) Take by mouth as needed.   albuterol  (VENTOLIN  HFA) 108 (90 Base) MCG/ACT inhaler USE AS DIRECTED   azelastine  (ASTELIN ) 0.1 % nasal spray Place 2 sprays into both nostrils 2 (two) times daily.   Cholecalciferol (VITAMIN D3) 5000 units TABS Take by mouth daily.   Cyanocobalamin  (VITAMIN B-12) 5000 MCG SUBL Take 5,000 mcg by mouth daily.   cyclobenzaprine  (FLEXERIL ) 10 MG tablet TAKE 1 TABLET BY MOUTH 3 TIMES DAILY   diazepam  (VALIUM ) 10 MG tablet Take 10  mg by mouth daily.   dicyclomine  (BENTYL ) 20 MG tablet TAKE 1 TABLET BY MOUTH FOUR TIMES DAILY AS DIRECTED   ELMIRON  100 MG capsule Take 200 mg by mouth 2 (two) times daily.   estradiol (ESTRACE) 0.1 MG/GM vaginal cream pea sized amount to external vagina 2 times weekly   fexofenadine (ALLEGRA) 180 MG tablet Take 180 mg by mouth every morning.   fluticasone  (FLONASE ) 50 MCG/ACT nasal spray USE 2 SPRAYS IN EACH NOSTRIL ONCE DAILY AS DIRECTED   fluticasone -salmeterol (ADVAIR) 250-50 MCG/ACT AEPB Inhale 1 puff into the lungs in the morning and at bedtime.   furosemide  (LASIX ) 20 MG tablet TAKE 1 TABLET BY MOUTH DAILY.   gabapentin (NEURONTIN) 800 MG tablet Take 800 mg by mouth 3 (three) times daily.   HYDROcodone -acetaminophen  (NORCO/VICODIN) 5-325 MG tablet Take by mouth.   hydrOXYzine  (ATARAX /VISTARIL ) 50 MG tablet Take 50 mg by mouth at bedtime.   hyoscyamine (LEVSIN SL) 0.125 MG SL tablet DISSOLVE 1 TABLET UNDER TONGUE EVERY 4 HOURS AS NEEDED FOR CRAMPING   MAY USE BY MOUTH OR UNDER THE TONGUE   IBUPROFEN  PO Take by mouth as needed.   ivabradine (CORLANOR) 5 MG TABS tablet Take 5 mg by mouth 2 (two) times daily with a meal.   Lactobacillus (ACIDOPHILUS PROBIOTIC PO) Take by mouth 3 (three) times daily.   lidocaine  (XYLOCAINE ) 5 % ointment Apply topically daily.   montelukast (SINGULAIR) 10 MG tablet Take 10 mg by mouth at bedtime.   nitrofurantoin (MACRODANTIN) 50 MG capsule Take 50 mg by mouth daily.   oxybutynin   (DITROPAN ) 5 MG tablet Take 5 mg by mouth 3 (three) times daily as needed.   PROCTOZONE-HC 2.5 % rectal cream Apply topically as needed.   pseudoephedrine  (SUDAFED) 30 MG tablet Take 30 mg by mouth every 6 (six) hours as needed.   Simethicone  125 MG CAPS Take 125 mg by mouth 4 (four) times daily.   valACYclovir  (VALTREX ) 1000 MG tablet TAKE ONE TABLET BY MOUTH TWICE DAILY   valACYclovir  (VALTREX ) 500 MG tablet TAKE ONE TABLET TWICE DAILY   Zinc 50 MG TABS Take 50 mg by mouth daily.   Coenzyme Q10 (CO Q 10 PO) Take 200 mg by mouth daily.   [facility] EVENING PRIMROSE OIL PO Take by mouth 2 (two) times daily.   Multiple Vitamins-Minerals (MULTIVITAMIN ADULTS PO) Take 1 tablet by mouth daily.   [DISCONTINUED] diltiazem  (CARDIZEM  CD) 120 MG 24 hr capsule Take 1 capsule (120 mg total) by mouth with breakfast, with lunch, and with evening meal.   [DISCONTINUED] diltiazem  (CARDIZEM ) 30 MG tablet TAKE 1 TABLET BY MOUTH EVERY 8 HOURS AS NEEDED FOR BREAKTHROUGH PALPITATIONS AS DIRECTED.   No facility-administered medications prior to visit.    Review of Systems  Constitutional: Negative.  Negative for weight loss.  HENT: Negative.    Eyes: Negative.   Respiratory: Negative.    Cardiovascular: Negative.   Gastrointestinal: Negative.   Genitourinary: Negative.   Skin: Negative.   Neurological: Negative.   Endo/Heme/Allergies: Negative.        Objective:   BP (!) 118/90   Pulse (!) 106   Temp 98.2 F (36.8 C) (Tympanic)   Ht 5' 5 (1.651 m)   Wt 197 lb 9.6 oz (89.6 kg)   LMP 05/20/2011   SpO2 97%   BMI 32.88 kg/m   Vitals:   07/13/23 1407  BP: (!) 118/90  Pulse: (!) 106  Temp: 98.2 F (36.8 C)  Height: 5' 5 (1.651 m)  Weight: 197 lb 9.6 oz (89.6 kg)  SpO2: 97%  TempSrc: Tympanic  BMI (Calculated): 32.88    Physical Exam Vitals reviewed.  Constitutional:      General: She is not in acute distress. HENT:     Head: Normocephalic.     Nose: Nose normal.     Mouth/Throat:      Mouth: Mucous membranes are moist.   Eyes:     Extraocular Movements: Extraocular movements intact.     Pupils: Pupils are equal, round, and reactive to light.   Neck:     Vascular: No carotid bruit.   Cardiovascular:     Rate and Rhythm: Normal rate and regular rhythm.     Heart sounds: No murmur heard. Pulmonary:     Effort: Pulmonary effort is normal.     Breath sounds: No rhonchi or rales.  Abdominal:     General: Bowel sounds are normal.     Palpations: Abdomen is soft. There is no hepatomegaly, splenomegaly or mass.     Comments: Stimulator in right gluteus   Musculoskeletal:        General: Normal range of motion.     Cervical back: Normal range of motion. No tenderness.   Skin:    General: Skin is warm and dry.   Neurological:     General: No focal deficit present.     Mental Status: She is alert and oriented to person, place, and time.     Cranial Nerves: No cranial nerve deficit.     Motor: No weakness.   Psychiatric:        Mood and Affect: Mood normal.        Behavior: Behavior normal.      No results found for any visits on 07/13/23.  Recent Results (from the past 2160 hours)  CBC With Diff/Platelet     Status: Abnormal   Collection Time: 07/11/23  8:37 AM  Result Value Ref Range   WBC 7.2 3.4 - 10.8 x10E3/uL   RBC 3.98 3.77 - 5.28 x10E6/uL   Hemoglobin 13.2 11.1 - 15.9 g/dL   Hematocrit 57.8 46.9 - 46.6 %   MCV 102 (H) 79 - 97 fL   MCH 33.2 (H) 26.6 - 33.0 pg   MCHC 32.6 31.5 - 35.7 g/dL   RDW 62.9 52.8 - 41.3 %   Platelets 287 150 - 450 x10E3/uL   Neutrophils 56 Not Estab. %   Lymphs 31 Not Estab. %   Monocytes 9 Not Estab. %   Eos 3 Not Estab. %   Basos 1 Not Estab. %   Neutrophils Absolute 4.0 1.4 - 7.0 x10E3/uL   Lymphocytes Absolute 2.2 0.7 - 3.1 x10E3/uL   Monocytes Absolute 0.7 0.1 - 0.9 x10E3/uL   EOS (ABSOLUTE) 0.2 0.0 - 0.4 x10E3/uL   Basophils Absolute 0.1 0.0 - 0.2 x10E3/uL   Immature Granulocytes 0 Not Estab. %   Immature  Grans (Abs) 0.0 0.0 - 0.1 x10E3/uL  Comprehensive metabolic panel     Status: Abnormal   Collection Time: 07/11/23  8:37 AM  Result Value Ref Range   Glucose 93 70 - 99 mg/dL   BUN 7 6 - 24 mg/dL   Creatinine, Ser 2.44 0.57 - 1.00 mg/dL   eGFR 85 >01 UU/VOZ/3.66   BUN/Creatinine Ratio 8 (L) 9 - 23   Sodium 138 134 - 144 mmol/L   Potassium 4.6 3.5 - 5.2 mmol/L   Chloride 102 96 - 106 mmol/L   CO2 22 20 -  29 mmol/L   Calcium 9.4 8.7 - 10.2 mg/dL   Total Protein 6.7 6.0 - 8.5 g/dL   Albumin 4.3 3.8 - 4.9 g/dL   Globulin, Total 2.4 1.5 - 4.5 g/dL   Bilirubin Total <4.0 0.0 - 1.2 mg/dL   Alkaline Phosphatase 61 44 - 121 IU/L   AST 16 0 - 40 IU/L   ALT 15 0 - 32 IU/L  Lipid panel     Status: None   Collection Time: 07/11/23  8:37 AM  Result Value Ref Range   Cholesterol, Total 186 100 - 199 mg/dL   Triglycerides 981 0 - 149 mg/dL   HDL 75 >19 mg/dL   VLDL Cholesterol Cal 24 5 - 40 mg/dL   LDL Chol Calc (NIH) 87 0 - 99 mg/dL   Chol/HDL Ratio 2.5 0.0 - 4.4 ratio    Comment:                                   T. Chol/HDL Ratio                                             Men  Women                               1/2 Avg.Risk  3.4    3.3                                   Avg.Risk  5.0    4.4                                2X Avg.Risk  9.6    7.1                                3X Avg.Risk 23.4   11.0   Hemoglobin A1c     Status: None   Collection Time: 07/11/23  8:37 AM  Result Value Ref Range   Hgb A1c MFr Bld 5.5 4.8 - 5.6 %    Comment:          Prediabetes: 5.7 - 6.4          Diabetes: >6.4          Glycemic control for adults with diabetes: <7.0    Est. average glucose Bld gHb Est-mCnc 111 mg/dL      Assessment & Plan:  As per problem list  Problem List Items Addressed This Visit   None Visit Diagnoses       Encounter for general adult medical examination with abnormal findings    -  Primary       Return in 3 months (on 10/13/2023) for fu with labs prior.   Total  time spent: 30 minutes  Arzella Bitters, MD  07/13/2023   This document may have been prepared by [facility] Voice Recognition software and as such may include unintentional dictation errors.

## 2023-07-19 DIAGNOSIS — D6851 Activated protein C resistance: Secondary | ICD-10-CM | POA: Diagnosis not present

## 2023-07-19 DIAGNOSIS — I471 Supraventricular tachycardia, unspecified: Secondary | ICD-10-CM | POA: Diagnosis not present

## 2023-07-24 ENCOUNTER — Other Ambulatory Visit: Payer: Self-pay | Admitting: Internal Medicine

## 2023-07-27 ENCOUNTER — Telehealth: Payer: Self-pay

## 2023-07-27 NOTE — Telephone Encounter (Signed)
 Patient LVM asking about some refills but the VM message was so long she got cut off so we didn't get all the meds she needs to get refilled, she also asked about changing pharmacies and I have updted the pharmacy on file

## 2023-08-07 DIAGNOSIS — G90A Postural orthostatic tachycardia syndrome (POTS): Secondary | ICD-10-CM | POA: Diagnosis not present

## 2023-08-07 DIAGNOSIS — M2420 Disorder of ligament, unspecified site: Secondary | ICD-10-CM | POA: Diagnosis not present

## 2023-08-07 DIAGNOSIS — N3010 Interstitial cystitis (chronic) without hematuria: Secondary | ICD-10-CM | POA: Diagnosis not present

## 2023-08-07 DIAGNOSIS — I011 Acute rheumatic endocarditis: Secondary | ICD-10-CM | POA: Diagnosis not present

## 2023-08-07 DIAGNOSIS — R002 Palpitations: Secondary | ICD-10-CM | POA: Diagnosis not present

## 2023-08-07 DIAGNOSIS — I4719 Other supraventricular tachycardia: Secondary | ICD-10-CM | POA: Diagnosis not present

## 2023-08-07 DIAGNOSIS — R55 Syncope and collapse: Secondary | ICD-10-CM | POA: Diagnosis not present

## 2023-08-07 DIAGNOSIS — M459 Ankylosing spondylitis of unspecified sites in spine: Secondary | ICD-10-CM | POA: Diagnosis not present

## 2023-08-15 DIAGNOSIS — I4719 Other supraventricular tachycardia: Secondary | ICD-10-CM | POA: Diagnosis not present

## 2023-08-15 DIAGNOSIS — R002 Palpitations: Secondary | ICD-10-CM | POA: Diagnosis not present

## 2023-08-15 DIAGNOSIS — G90A Postural orthostatic tachycardia syndrome (POTS): Secondary | ICD-10-CM | POA: Diagnosis not present

## 2023-08-16 ENCOUNTER — Other Ambulatory Visit: Payer: Self-pay | Admitting: Internal Medicine

## 2023-08-16 MED ORDER — FUROSEMIDE 20 MG PO TABS: 20.0000 mg | ORAL_TABLET | Freq: Every day | ORAL | 1 refills | Status: AC

## 2023-08-16 NOTE — Addendum Note (Signed)
 Addended by: NAIDA DENA BROCKS on: 08/16/2023 03:48 PM   Modules accepted: Orders

## 2023-08-20 ENCOUNTER — Other Ambulatory Visit: Payer: Self-pay

## 2023-08-24 ENCOUNTER — Ambulatory Visit (INDEPENDENT_AMBULATORY_CARE_PROVIDER_SITE_OTHER): Admitting: Internal Medicine

## 2023-08-24 ENCOUNTER — Encounter: Payer: Self-pay | Admitting: Internal Medicine

## 2023-08-24 VITALS — BP 120/80 | HR 88 | Temp 99.2°F | Ht 65.0 in | Wt 198.2 lb

## 2023-08-24 DIAGNOSIS — Z0130 Encounter for examination of blood pressure without abnormal findings: Secondary | ICD-10-CM

## 2023-08-24 DIAGNOSIS — M797 Fibromyalgia: Secondary | ICD-10-CM

## 2023-08-24 DIAGNOSIS — M545 Low back pain, unspecified: Secondary | ICD-10-CM | POA: Diagnosis not present

## 2023-08-24 DIAGNOSIS — E669 Obesity, unspecified: Secondary | ICD-10-CM

## 2023-08-24 NOTE — Progress Notes (Signed)
 Established Patient Office Visit  Subjective:  Patient ID: April [name], female    DOB: Apr 19, 1970  Age: 53 y.o. MRN: [id number]  Chief Complaint  Patient presents with   Follow-up    Wants to talk about medciations    Here to discuss her muscle relaxers. Apparently she was filling both Cyclobenzaprine  and Methacarbomol.    No other concerns at this time.   Past Medical History:  Diagnosis Date   Anxiety 12/14/2011   Asthma    no inhaler   Cataract    Diastolic dysfunction    a. 10/2015 Echo: EF nl, mod diast dysfxn. Mild MR/TR; b. 09/2016 Echo: EF 55-60%, no rwma. Nl RV size and fxn; c. 03/2020 Echo: EF 60-65%. No rwma.   Factor 5 Leiden mutation, heterozygous (HCC)    Fibromyalgia    Fracture of 5th metatarsal 03/2011   left foot -     Heart palpitations    a. Notes intermittent tachycardia (sinus). Did not tolerate beta blockers 2/2 urinary incontinence; b. 07/2017 Event monitor: Avg HR 77 (49-136). No significant ectopy/pauses. Triggered events corresponded w/ sinus rhythm, sinus arrhythmias, and artifact.   Interstitial cystitis    Non-cardiac chest pain    a. 12/2015 CTA chest: no PE. No significant coronary Ca2+. Mosaic attenuation pattern in both lungs, may reflect small airway dzs; c. 12/2015 Ex MV: Walked 6 mins. No ischemia/scar; d. 12/2019 Cor CTA: Ca2+ = 0. Nl cors. ? PFO w/o evidence of flow (not seen on echo).   Ovarian cyst    Pancreatitis    Pelvic floor dysfunction    Rheumatoid arteritis (HCC)    Sinus infection    chronic   Syncope    a. 08/2017 Event monitor: Average heart rate 77 (49-136).  Periods of sinus arrhythmia noted.  Triggered events corresponded with sinus rhythm, sinus arrhythmia, and artifact.  No significant ectopy, sustained arrhythmias, or prolonged pauses observed.   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    Past Surgical History:   Procedure Laterality Date   ABDOMINAL HYSTERECTOMY     CHOLECYSTECTOMY     CYSTOSCOPY WITH HYDRODISTENSION AND BIOPSY  04/21/2011   Dr Lamar Birmingham   LAPAROSCOPIC ASSISTED VAGINAL HYSTERECTOMY  08/21/2011   Procedure: LAPAROSCOPIC ASSISTED VAGINAL HYSTERECTOMY;  Surgeon: Rosaline LITTIE Cobble, MD;  Location: [facility];  Service: Gynecology;  Laterality: N/A;  OPEN LAPAROSCOPIC   LAPAROSCOPIC NISSEN FUNDOPLICATION  2007   neck ablation Left 12/29/2019   neck ablation Right 01/05/2020   SALPINGOOPHORECTOMY  08/21/2011   Procedure: SALPINGO OOPHERECTOMY;  Surgeon: Rosaline LITTIE Cobble, MD;  Location: [facility];  Service: Gynecology;  Laterality: Bilateral;   TUBAL LIGATION  2010   WISDOM TOOTH EXTRACTION      Social History   Socioeconomic History   Marital status: Married    Spouse name: Not on file   Number of children: Not on file   Years of education: Not on file   Highest education level: Not on file  Occupational History   Not on file  Tobacco Use   Smoking status: Never   Smokeless tobacco: Never  Vaping Use   Vaping status: Never Used  Substance and Sexual Activity   Alcohol use: No   Drug use: No   Sexual activity: Yes    Birth control/protection: Surgical  Other Topics Concern   Not on file  Social History Narrative   Not on file   Social Drivers of Health   Financial Resource Strain: Not on file  Food Insecurity: Low Risk  (04/19/2023)   Received from [facility]   Hunger Vital Sign    Within the past 12 months, you worried that your food would run out before you got money to buy more: Never true    Within the past 12 months, the food you bought just didn't last and you didn't have money to get more. : Never true  Transportation Needs: No Transportation Needs (04/19/2023)   Received from [facility]    In the past 12 months, has lack of reliable transportation kept you from medical appointments, meetings, work or from getting things needed for daily  living? : No  Physical Activity: Not on file  Stress: Not on file  Social Connections: Unknown (05/19/2022)   Received from [facility]   Social Network    Social Network: Not on file  Intimate Partner Violence: Unknown (05/19/2022)   Received from [facility]   HITS    Physically Hurt: Not on file    Insult or Talk Down To: Not on file    Threaten Physical Harm: Not on file    Scream or Curse: Not on file    Family History  Problem Relation Age of Onset   Heart Problems Mother    Vascular Disease Mother    Skin cancer Mother    Vascular Disease Maternal Grandmother    Kidney Stones Father    Skin cancer Maternal Uncle    Anesthesia problems Neg Hx     Allergies  Allergen Reactions   Amitriptyline Other (See Comments)    Other reaction(Bill Yohn): Other  Change in mental status   Amoxicillin Rash   Bromelains     Other reaction(Jannetta Massey): Abdominal pain   Celexa [Citalopram Hydrobromide] Other (See Comments)    Other reaction(Hang Ammon): Other Change in mental status   Nortriptyline     Other reaction(Ashford Clouse): Other   Penicillins Swelling and Rash   Pregabalin     Other reaction(Annalissa Murphey): Myalgias (muscle pain)   Bromelains [Pineapple Extract] Other (See Comments)    Severe pain   Budesonide-Formoterol Fumarate     Other reaction(Tiffony Kite): Other   Citalopram Other (See Comments)   Mangifera Indica Swelling   Methylene Blue Other (See Comments)    Caused inflamed pancreas, per pt should not use   Sertraline Other (See Comments)    Other reaction(Enmanuel Zufall): Other Suicidal thoughts   Sulfa Antibiotics Other (See Comments)   Sulfonamide Derivatives Other (See Comments)    excrutiating pain, patient states it caused her to have osteoarthritis   Xarelto  [Rivaroxaban ]    Bromaline Vallery Chambers Bromm] Rash   Other Rash    Mango    Outpatient Medications Prior to Visit  Medication Sig   Acetaminophen  (TYLENOL  PO) Take by mouth as needed.   albuterol  (VENTOLIN  HFA) 108 (90 Base) MCG/ACT inhaler Inhale  1 puff into the lungs every 6 (six) hours as needed for wheezing or shortness of breath.   azelastine  (ASTELIN ) 0.1 % nasal spray Place 2 sprays into both nostrils 2 (two) times daily.   Cholecalciferol (VITAMIN D3) 5000 units TABS Take by mouth daily.   Cyanocobalamin  (VITAMIN B-12) 5000 MCG SUBL Take 5,000 mcg by mouth daily.   diazepam  (VALIUM ) 10 MG tablet Take 10 mg by mouth daily.   dicyclomine  (BENTYL ) 20 MG tablet TAKE 1 TABLET BY MOUTH FOUR TIMES  DAILY AS DIRECTED   ELMIRON  100 MG capsule Take 200 mg by mouth 2 (two) times daily.   estradiol (ESTRACE) 0.1 MG/GM vaginal cream pea sized amount to external vagina 2 times weekly   fexofenadine (ALLEGRA) 180 MG tablet Take 180 mg by mouth every morning.   fluticasone  (FLONASE ) 50 MCG/ACT nasal spray Place 1 spray into both nostrils daily.   fluticasone -salmeterol (ADVAIR) 250-50 MCG/ACT AEPB Inhale 1 puff into the lungs in the morning and at bedtime.   furosemide  (LASIX ) 20 MG tablet Take 1 tablet (20 mg total) by mouth daily.   gabapentin (NEURONTIN) 800 MG tablet Take 800 mg by mouth 3 (three) times daily.   HYDROcodone -acetaminophen  (NORCO/VICODIN) 5-325 MG tablet Take by mouth. (Patient taking differently: Take 1 tablet by mouth every 6 (six) hours as needed.)   hydrOXYzine  (ATARAX /VISTARIL ) 50 MG tablet Take 50 mg by mouth at bedtime.   hyoscyamine (LEVSIN SL) 0.125 MG SL tablet DISSOLVE 1 TABLET UNDER TONGUE EVERY 4 HOURS AS NEEDED FOR CRAMPING   MAY USE BY MOUTH OR UNDER THE TONGUE   IBUPROFEN  PO Take by mouth as needed.   ivabradine (CORLANOR) 5 MG TABS tablet Take 5 mg by mouth 2 (two) times daily with a meal.   Lactobacillus (ACIDOPHILUS PROBIOTIC PO) Take by mouth 3 (three) times daily.   lidocaine  (XYLOCAINE ) 5 % ointment Apply topically daily.   montelukast (SINGULAIR) 10 MG tablet Take 10 mg by mouth at bedtime.   nitrofurantoin (MACRODANTIN) 50 MG capsule Take 50 mg by mouth daily.   oxybutynin  (DITROPAN ) 5 MG tablet Take 5  mg by mouth 3 (three) times daily as needed.   PROCTOZONE-HC 2.5 % rectal cream Apply topically as needed.   pseudoephedrine  (SUDAFED) 30 MG tablet Take 30 mg by mouth every 6 (six) hours as needed.   Simethicone  125 MG CAPS Take 125 mg by mouth 4 (four) times daily.   valACYclovir  (VALTREX ) 1000 MG tablet TAKE ONE TABLET BY MOUTH TWICE DAILY   Zinc 50 MG TABS Take 50 mg by mouth daily.   [DISCONTINUED] cyclobenzaprine  (FLEXERIL ) 10 MG tablet TAKE 1 TABLET BY MOUTH 3 TIMES DAILY   valACYclovir  (VALTREX ) 500 MG tablet TAKE ONE TABLET TWICE DAILY (Patient not taking: Reported on 08/24/2023)   [DISCONTINUED] Coenzyme Q10 (CO Q 10 PO) Take 200 mg by mouth daily.   [DISCONTINUED] [employer] EVENING PRIMROSE OIL PO Take by mouth 2 (two) times daily.   [DISCONTINUED] Multiple Vitamins-Minerals (MULTIVITAMIN ADULTS PO) Take 1 tablet by mouth daily.   No facility-administered medications prior to visit.    Review of Systems  Constitutional: Negative.  Negative for weight loss.  HENT: Negative.    Eyes: Negative.   Respiratory: Negative.    Cardiovascular: Negative.   Gastrointestinal: Negative.   Genitourinary: Negative.   Skin: Negative.   Neurological: Negative.   Endo/Heme/Allergies: Negative.        Objective:   BP 120/80   Pulse 88   Temp 99.2 F (37.3 C)   Ht 5' 5 (1.651 m)   Wt 198 lb 3.2 oz (89.9 kg)   LMP 05/20/2011   SpO2 97%   BMI 32.98 kg/m   Vitals:   08/24/23 1129  BP: 120/80  Pulse: 88  Temp: 99.2 F (37.3 C)  Height: 5' 5 (1.651 m)  Weight: 198 lb 3.2 oz (89.9 kg)  SpO2: 97%  BMI (Calculated): 32.98    Physical Exam Vitals reviewed.  Constitutional:      General: She is not in  acute distress. HENT:     Head: Normocephalic.     Nose: Nose normal.     Mouth/Throat:     Mouth: Mucous membranes are moist.  Eyes:     Extraocular Movements: Extraocular movements intact.     Pupils: Pupils are equal, round, and reactive to light.  Neck:     Vascular: No  carotid bruit.  Cardiovascular:     Rate and Rhythm: Normal rate and regular rhythm.     Heart sounds: No murmur heard. Pulmonary:     Effort: Pulmonary effort is normal.     Breath sounds: No rhonchi or rales.  Abdominal:     General: Bowel sounds are normal.     Palpations: Abdomen is soft. There is no hepatomegaly, splenomegaly or mass.     Comments: Stimulator in right gluteus  Musculoskeletal:        General: Normal range of motion.     Cervical back: Normal range of motion. No tenderness.  Skin:    General: Skin is warm and dry.  Neurological:     General: No focal deficit present.     Mental Status: She is alert and oriented to person, place, and time.     Cranial Nerves: No cranial nerve deficit.     Motor: No weakness.  Psychiatric:        Mood and Affect: Mood normal.        Behavior: Behavior normal.      No results found for any visits on 08/24/23.  Recent Results (from the past 2160 hours)  CBC With Diff/Platelet     Status: Abnormal   Collection Time: 07/11/23  8:37 AM  Result Value Ref Range   WBC 7.2 3.4 - 10.8 x10E3/uL   RBC 3.98 3.77 - 5.28 x10E6/uL   Hemoglobin 13.2 11.1 - 15.9 g/dL   Hematocrit 59.4 65.9 - 46.6 %   MCV 102 (H) 79 - 97 fL   MCH 33.2 (H) 26.6 - 33.0 pg   MCHC 32.6 31.5 - 35.7 g/dL   RDW 87.3 88.2 - 84.5 %   Platelets 287 150 - 450 x10E3/uL   Neutrophils 56 Not Estab. %   Lymphs 31 Not Estab. %   Monocytes 9 Not Estab. %   Eos 3 Not Estab. %   Basos 1 Not Estab. %   Neutrophils Absolute 4.0 1.4 - 7.0 x10E3/uL   Lymphocytes Absolute 2.2 0.7 - 3.1 x10E3/uL   Monocytes Absolute 0.7 0.1 - 0.9 x10E3/uL   EOS (ABSOLUTE) 0.2 0.0 - 0.4 x10E3/uL   Basophils Absolute 0.1 0.0 - 0.2 x10E3/uL   Immature Granulocytes 0 Not Estab. %   Immature Grans (Abs) 0.0 0.0 - 0.1 x10E3/uL  Comprehensive metabolic panel     Status: Abnormal   Collection Time: 07/11/23  8:37 AM  Result Value Ref Range   Glucose 93 70 - 99 mg/dL   BUN 7 6 - 24 mg/dL    Creatinine, Ser 9.16 0.57 - 1.00 mg/dL   eGFR 85 >40 fO/fpw/8.26   BUN/Creatinine Ratio 8 (L) 9 - 23   Sodium 138 134 - 144 mmol/L   Potassium 4.6 3.5 - 5.2 mmol/L   Chloride 102 96 - 106 mmol/L   CO2 22 20 - 29 mmol/L   Calcium 9.4 8.7 - 10.2 mg/dL   Total Protein 6.7 6.0 - 8.5 g/dL   Albumin 4.3 3.8 - 4.9 g/dL   Globulin, Total 2.4 1.5 - 4.5 g/dL   Bilirubin Total <9.7 0.0 -  1.2 mg/dL   Alkaline Phosphatase 61 44 - 121 IU/L   AST 16 0 - 40 IU/L   ALT 15 0 - 32 IU/L  Lipid panel     Status: None   Collection Time: 07/11/23  8:37 AM  Result Value Ref Range   Cholesterol, Total 186 100 - 199 mg/dL   Triglycerides 855 0 - 149 mg/dL   HDL 75 >60 mg/dL   VLDL Cholesterol Cal 24 5 - 40 mg/dL   LDL Chol Calc (NIH) 87 0 - 99 mg/dL   Chol/HDL Ratio 2.5 0.0 - 4.4 ratio    Comment:                                   T. Chol/HDL Ratio                                             Men  Women                               1/2 Avg.Risk  3.4    3.3                                   Avg.Risk  5.0    4.4                                2X Avg.Risk  9.6    7.1                                3X Avg.Risk 23.4   11.0   Hemoglobin A1c     Status: None   Collection Time: 07/11/23  8:37 AM  Result Value Ref Range   Hgb A1c MFr Bld 5.5 4.8 - 5.6 %    Comment:          Prediabetes: 5.7 - 6.4          Diabetes: >6.4          Glycemic control for adults with diabetes: <7.0    Est. average glucose Bld gHb Est-mCnc 111 mg/dL      Assessment & Plan:  As per problem list. DC Cyclobenzaprine  while Robaxin  will be prescribed by her other provider.  Problem List Items Addressed This Visit       Other   Fibromyalgia - Primary (Chronic)   Obesity (BMI 30-39.9)   Other Visit Diagnoses       Lumbar pain           Return if symptoms worsen or fail to improve.   Total time spent: 20 minutes  Sherrill Cinderella Perry, MD  08/24/2023   This document may have been prepared by [facility] Voice  Recognition software and as such may include unintentional dictation errors.

## 2023-08-25 ENCOUNTER — Other Ambulatory Visit: Payer: Self-pay | Admitting: Internal Medicine

## 2023-08-27 ENCOUNTER — Other Ambulatory Visit: Payer: Self-pay | Admitting: Internal Medicine

## 2023-08-27 DIAGNOSIS — R110 Nausea: Secondary | ICD-10-CM

## 2023-09-03 DIAGNOSIS — R002 Palpitations: Secondary | ICD-10-CM | POA: Diagnosis not present

## 2023-09-10 DIAGNOSIS — R002 Palpitations: Secondary | ICD-10-CM | POA: Diagnosis not present

## 2023-09-15 ENCOUNTER — Other Ambulatory Visit: Payer: Self-pay | Admitting: Internal Medicine

## 2023-09-20 DIAGNOSIS — Z760 Encounter for issue of repeat prescription: Secondary | ICD-10-CM | POA: Diagnosis not present

## 2023-09-28 DIAGNOSIS — G90A Postural orthostatic tachycardia syndrome (POTS): Secondary | ICD-10-CM | POA: Diagnosis not present

## 2023-09-28 DIAGNOSIS — I498 Other specified cardiac arrhythmias: Secondary | ICD-10-CM | POA: Diagnosis not present

## 2023-09-28 DIAGNOSIS — Z79899 Other long term (current) drug therapy: Secondary | ICD-10-CM | POA: Diagnosis not present

## 2023-10-04 ENCOUNTER — Other Ambulatory Visit: Payer: Self-pay | Admitting: Internal Medicine

## 2023-10-15 ENCOUNTER — Ambulatory Visit: Admitting: Internal Medicine

## 2023-10-26 ENCOUNTER — Ambulatory Visit: Admitting: Internal Medicine

## 2023-10-27 ENCOUNTER — Other Ambulatory Visit: Payer: Self-pay | Admitting: Internal Medicine

## 2023-10-27 DIAGNOSIS — B029 Zoster without complications: Secondary | ICD-10-CM

## 2023-11-02 ENCOUNTER — Ambulatory Visit: Admitting: Internal Medicine

## 2023-11-05 DIAGNOSIS — R790 Abnormal level of blood mineral: Secondary | ICD-10-CM | POA: Diagnosis not present

## 2023-11-05 DIAGNOSIS — G90A Postural orthostatic tachycardia syndrome (POTS): Secondary | ICD-10-CM | POA: Diagnosis not present

## 2023-11-08 ENCOUNTER — Other Ambulatory Visit

## 2023-11-08 DIAGNOSIS — M797 Fibromyalgia: Secondary | ICD-10-CM | POA: Diagnosis not present

## 2023-11-08 DIAGNOSIS — R7303 Prediabetes: Secondary | ICD-10-CM | POA: Diagnosis not present

## 2023-11-09 ENCOUNTER — Other Ambulatory Visit: Payer: Self-pay | Admitting: Internal Medicine

## 2023-11-09 LAB — COMPREHENSIVE METABOLIC PANEL WITH GFR
ALT: 16 IU/L (ref 0–32)
AST: 16 IU/L (ref 0–40)
Albumin: 4.6 g/dL (ref 3.8–4.9)
Alkaline Phosphatase: 44 IU/L — ABNORMAL LOW (ref 49–135)
BUN/Creatinine Ratio: 7 — ABNORMAL LOW (ref 9–23)
BUN: 5 mg/dL — ABNORMAL LOW (ref 6–24)
Bilirubin Total: 0.3 mg/dL (ref 0.0–1.2)
CO2: 23 mmol/L (ref 20–29)
Calcium: 9.7 mg/dL (ref 8.7–10.2)
Chloride: 100 mmol/L (ref 96–106)
Creatinine, Ser: 0.68 mg/dL (ref 0.57–1.00)
Globulin, Total: 2.5 g/dL (ref 1.5–4.5)
Glucose: 99 mg/dL (ref 70–99)
Potassium: 5.1 mmol/L (ref 3.5–5.2)
Sodium: 138 mmol/L (ref 134–144)
Total Protein: 7.1 g/dL (ref 6.0–8.5)
eGFR: 105 mL/min/1.73 (ref >59)

## 2023-11-09 LAB — LIPID PANEL
Chol/HDL Ratio: 2.5 ratio (ref 0.0–4.4)
Cholesterol, Total: 173 mg/dL (ref 100–199)
HDL: 69 mg/dL (ref >39)
LDL Chol Calc (NIH): 88 mg/dL (ref 0–99)
Triglycerides: 89 mg/dL (ref 0–149)
VLDL Cholesterol Cal: 16 mg/dL (ref 5–40)

## 2023-11-09 LAB — CBC WITH DIFF/PLATELET
Basophils Absolute: 0.1 x10E3/uL (ref 0.0–0.2)
Basos: 1 %
EOS (ABSOLUTE): 0.2 x10E3/uL (ref 0.0–0.4)
Eos: 3 %
Hematocrit: 39.3 % (ref 34.0–46.6)
Hemoglobin: 13.2 g/dL (ref 11.1–15.9)
Immature Grans (Abs): 0 x10E3/uL (ref 0.0–0.1)
Immature Granulocytes: 0 %
Lymphocytes Absolute: 2.1 x10E3/uL (ref 0.7–3.1)
Lymphs: 35 %
MCH: 33.3 pg — ABNORMAL HIGH (ref 26.6–33.0)
MCHC: 33.6 g/dL (ref 31.5–35.7)
MCV: 99 fL — ABNORMAL HIGH (ref 79–97)
Monocytes Absolute: 0.6 x10E3/uL (ref 0.1–0.9)
Monocytes: 9 %
Neutrophils Absolute: 3.1 x10E3/uL (ref 1.4–7.0)
Neutrophils: 51 %
Platelets: 297 x10E3/uL (ref 150–450)
RBC: 3.96 x10E6/uL (ref 3.77–5.28)
RDW: 14.1 % (ref 11.7–15.4)
WBC: 6 x10E3/uL (ref 3.4–10.8)

## 2023-11-09 LAB — HEMOGLOBIN A1C
Est. average glucose Bld gHb Est-mCnc: 117 mg/dL
Hgb A1c MFr Bld: 5.7 % — ABNORMAL HIGH (ref 4.8–5.6)

## 2023-11-12 ENCOUNTER — Ambulatory Visit (INDEPENDENT_AMBULATORY_CARE_PROVIDER_SITE_OTHER): Admitting: Internal Medicine

## 2023-11-12 ENCOUNTER — Ambulatory Visit: Payer: Self-pay | Admitting: Internal Medicine

## 2023-11-12 VITALS — BP 130/88 | HR 107 | Temp 97.5°F | Ht 65.0 in | Wt 193.0 lb

## 2023-11-12 DIAGNOSIS — J301 Allergic rhinitis due to pollen: Secondary | ICD-10-CM

## 2023-11-12 DIAGNOSIS — I479 Paroxysmal tachycardia, unspecified: Secondary | ICD-10-CM | POA: Insufficient documentation

## 2023-11-12 DIAGNOSIS — M545 Low back pain, unspecified: Secondary | ICD-10-CM

## 2023-11-12 DIAGNOSIS — R7303 Prediabetes: Secondary | ICD-10-CM

## 2023-11-12 DIAGNOSIS — J4520 Mild intermittent asthma, uncomplicated: Secondary | ICD-10-CM | POA: Diagnosis not present

## 2023-11-12 DIAGNOSIS — Z0130 Encounter for examination of blood pressure without abnormal findings: Secondary | ICD-10-CM

## 2023-11-12 MED ORDER — CYCLOBENZAPRINE HCL 10 MG PO TABS: 10.0000 mg | ORAL_TABLET | Freq: Three times a day (TID) | ORAL | 2 refills | Status: AC

## 2023-11-12 MED ORDER — POLYSACCHARIDE IRON COMPLEX 150 MG PO CAPS: 150.0000 mg | ORAL_CAPSULE | Freq: Every day | ORAL | 0 refills | Status: AC

## 2023-11-12 MED ORDER — FLUTICASONE-SALMETEROL 250-50 MCG/ACT IN AEPB: 1.0000 | INHALATION_SPRAY | Freq: Two times a day (BID) | RESPIRATORY_TRACT | 2 refills | Status: DC

## 2023-11-12 MED ORDER — FLUTICASONE PROPIONATE 50 MCG/ACT NA SUSP: 1.0000 | Freq: Every day | NASAL | 2 refills | Status: AC

## 2023-11-12 NOTE — Progress Notes (Signed)
 Established Patient Office Visit  Subjective:  Patient ID: April [name], female    DOB: 03-22-1970  Age: 53 y.o. MRN: [id number]  Chief Complaint  Patient presents with   Follow-up    3 month lab results    No new complaints, here for lab review and medication refills.     No other concerns at this time.   Past Medical History:  Diagnosis Date   Anxiety 12/14/2011   Asthma    no inhaler   Cataract    Diastolic dysfunction    a. 10/2015 Echo: EF nl, mod diast dysfxn. Mild MR/TR; b. 09/2016 Echo: EF 55-60%, no rwma. Nl RV size and fxn; c. 03/2020 Echo: EF 60-65%. No rwma.   Factor 5 Leiden mutation, heterozygous    Fibromyalgia    Fracture of 5th metatarsal 03/2011   left foot -     Heart palpitations    a. Notes intermittent tachycardia (sinus). Did not tolerate beta blockers 2/2 urinary incontinence; b. 07/2017 Event monitor: Avg HR 77 (49-136). No significant ectopy/pauses. Triggered events corresponded w/ sinus rhythm, sinus arrhythmias, and artifact.   Interstitial cystitis    Non-cardiac chest pain    a. 12/2015 CTA chest: no PE. No significant coronary Ca2+. Mosaic attenuation pattern in both lungs, may reflect small airway dzs; c. 12/2015 Ex MV: Walked 6 mins. No ischemia/scar; d. 12/2019 Cor CTA: Ca2+ = 0. Nl cors. ? PFO w/o evidence of flow (not seen on echo).   Ovarian cyst    Pancreatitis    Pelvic floor dysfunction    Rheumatoid arteritis (HCC)    Sinus infection    chronic   Syncope    a. 08/2017 Event monitor: Average heart rate 77 (49-136).  Periods of sinus arrhythmia noted.  Triggered events corresponded with sinus rhythm, sinus arrhythmia, and artifact.  No significant ectopy, sustained arrhythmias, or prolonged pauses observed.   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    Past Surgical History:  Procedure Laterality Date   ABDOMINAL HYSTERECTOMY      CHOLECYSTECTOMY     CYSTOSCOPY WITH HYDRODISTENSION AND BIOPSY  04/21/2011   Dr Lamar Birmingham   LAPAROSCOPIC ASSISTED VAGINAL HYSTERECTOMY  08/21/2011   Procedure: LAPAROSCOPIC ASSISTED VAGINAL HYSTERECTOMY;  Surgeon: Rosaline LITTIE Cobble, MD;  Location: [facility];  Service: Gynecology;  Laterality: N/A;  OPEN LAPAROSCOPIC   LAPAROSCOPIC NISSEN FUNDOPLICATION  2007   neck ablation Left 12/29/2019   neck ablation Right 01/05/2020   SALPINGOOPHORECTOMY  08/21/2011   Procedure: SALPINGO OOPHERECTOMY;  Surgeon: Rosaline LITTIE Cobble, MD;  Location: [facility];  Service: Gynecology;  Laterality: Bilateral;   TUBAL LIGATION  2010   WISDOM TOOTH EXTRACTION      Social History   Socioeconomic History   Marital status: Married    Spouse name: Not on file   Number of children: Not on file   Years of education: Not on file   Highest education level: Not on file  Occupational History   Not on file  Tobacco Use   Smoking status: Never   Smokeless tobacco: Never  Vaping Use   Vaping status: Never Used  Substance and Sexual Activity   Alcohol use: No   Drug use: No   Sexual activity: Yes    Birth control/protection: Surgical  Other Topics Concern   Not on file  Social History Narrative  Not on file   Social Drivers of Health   Financial Resource Strain: Not on file  Food Insecurity: Low Risk  (04/19/2023)   Received from [facility]   Hunger Vital Sign    Within the past 12 months, you worried that your food would run out before you got money to buy more: Never true    Within the past 12 months, the food you bought just didn't last and you didn't have money to get more. : Never true  Transportation Needs: No Transportation Needs (04/19/2023)   Received from [facility]    In the past 12 months, has lack of reliable transportation kept you from medical appointments, meetings, work or from getting things needed for daily living? : No  Physical Activity: Not on file  Stress:  Not on file  Social Connections: Unknown (05/19/2022)   [facility]   Social Network    Social Network: Not on file  Intimate Partner Violence: Unknown (05/19/2022)   Received from [facility]   HITS    Physically Hurt: Not on file    Insult or Talk Down To: Not on file    Threaten Physical Harm: Not on file    Scream or Curse: Not on file    Family History  Problem Relation Age of Onset   Heart Problems Mother    Vascular Disease Mother    Skin cancer Mother    Vascular Disease Maternal Grandmother    Kidney Stones Father    Skin cancer Maternal Uncle    Anesthesia problems Neg Hx     Allergies  Allergen Reactions   Amitriptyline Other (See Comments)    Other reaction(Jazariah Teall): Other  Change in mental status   Amoxicillin Rash   Bromelains     Other reaction(Orelia Brandstetter): Abdominal pain   Celexa [Citalopram Hydrobromide] Other (See Comments)    Other reaction(Aryiana Klinkner): Other Change in mental status   Nortriptyline     Other reaction(Amaliya Whitelaw): Other   Penicillins Swelling and Rash   Pregabalin     Other reaction(Caroljean Monsivais): Myalgias (muscle pain)   Bromelains [Pineapple Extract] Other (See Comments)    Severe pain   Budesonide-Formoterol Fumarate     Other reaction(Ayla Dunigan): Other   Citalopram Other (See Comments)   Mangifera Indica Swelling   Methylene Blue Other (See Comments)    Caused inflamed pancreas, per pt should not use   Sertraline Other (See Comments)    Other reaction(Elohim Brune): Other Suicidal thoughts   Sulfa Antibiotics Other (See Comments)   Sulfonamide Derivatives Other (See Comments)    excrutiating pain, patient states it caused her to have osteoarthritis   Xarelto  [Rivaroxaban ]    Bromaline Vallery Di Bromm] Rash   Other Rash    Mango    Outpatient Medications Prior to Visit  Medication Sig   Acetaminophen  (TYLENOL  PO) Take by mouth as needed.   albuterol  (VENTOLIN  HFA) 108 (90 Base) MCG/ACT inhaler Inhale 1 puff into the lungs every 6 (six) hours as needed for  wheezing or shortness of breath.   azelastine  (ASTELIN ) 0.1 % nasal spray Place 2 sprays into both nostrils 2 (two) times daily.   Cholecalciferol (VITAMIN D3) 5000 units TABS Take by mouth daily.   Cyanocobalamin  (VITAMIN B-12) 5000 MCG SUBL Take 5,000 mcg by mouth daily.   diazepam  (VALIUM ) 10 MG tablet Take 10 mg by mouth daily.   dicyclomine  (BENTYL ) 20 MG tablet TAKE 1 TABLET BY MOUTH FOUR TIMES DAILY AS DIRECTED  ELMIRON  100 MG capsule Take 200 mg by mouth 2 (two) times daily.   estradiol (ESTRACE) 0.1 MG/GM vaginal cream pea sized amount to external vagina 2 times weekly   fexofenadine (ALLEGRA) 180 MG tablet Take 180 mg by mouth every morning.   furosemide  (LASIX ) 20 MG tablet Take 1 tablet (20 mg total) by mouth daily.   gabapentin (NEURONTIN) 800 MG tablet Take 800 mg by mouth 3 (three) times daily.   HYDROcodone -acetaminophen  (NORCO/VICODIN) 5-325 MG tablet Take by mouth. (Patient taking differently: Take 1 tablet by mouth every 6 (six) hours as needed.)   hydrOXYzine  (ATARAX /VISTARIL ) 50 MG tablet Take 50 mg by mouth at bedtime.   hyoscyamine (LEVSIN SL) 0.125 MG SL tablet DISSOLVE 1 TABLET UNDER TONGUE EVERY 4 HOURS AS NEEDED FOR CRAMPING   MAY USE BY MOUTH OR UNDER THE TONGUE   IBUPROFEN  PO Take by mouth as needed.   ivabradine (CORLANOR) 5 MG TABS tablet Take 5 mg by mouth 2 (two) times daily with a meal.   Lactobacillus (ACIDOPHILUS PROBIOTIC PO) Take by mouth 3 (three) times daily.   lidocaine  (XYLOCAINE ) 5 % ointment Apply topically daily.   montelukast (SINGULAIR) 10 MG tablet Take 10 mg by mouth at bedtime.   nitrofurantoin (MACRODANTIN) 50 MG capsule Take 50 mg by mouth daily.   ondansetron  (ZOFRAN -ODT) 4 MG disintegrating tablet TAKE ONE TABLET BY MOUTH EVERY 8 HOURS AS NEEDED FOR NAUSEA / VOMITING   oxybutynin  (DITROPAN ) 5 MG tablet Take 5 mg by mouth 3 (three) times daily as needed.   PROCTOZONE-HC 2.5 % rectal cream Apply topically as needed.   pseudoephedrine   (SUDAFED) 30 MG tablet Take 30 mg by mouth every 6 (six) hours as needed.   Simethicone  125 MG CAPS Take 125 mg by mouth 4 (four) times daily.   valACYclovir  (VALTREX ) 1000 MG tablet TAKE ONE TABLET BY MOUTH TWICE DAILY   valACYclovir  (VALTREX ) 500 MG tablet TAKE ONE TABLET TWICE DAILY   Zinc 50 MG TABS Take 50 mg by mouth daily.   [DISCONTINUED] cyclobenzaprine  (FLEXERIL ) 10 MG tablet TAKE 1 TABLET BY MOUTH 3 TIMES DAILY   [DISCONTINUED] fluticasone  (FLONASE ) 50 MCG/ACT nasal spray Place 1 spray into both nostrils daily.   [DISCONTINUED] fluticasone -salmeterol (ADVAIR) 250-50 MCG/ACT AEPB Inhale 1 puff into the lungs in the morning and at bedtime.   No facility-administered medications prior to visit.    Review of Systems  Constitutional:  Positive for weight loss (5 lbs).  HENT: Negative.    Eyes: Negative.   Respiratory: Negative.    Cardiovascular: Negative.   Gastrointestinal: Negative.   Genitourinary: Negative.   Skin: Negative.   Neurological: Negative.   Endo/Heme/Allergies: Negative.        Objective:   BP 130/88   Pulse (!) 107   Temp (!) 97.5 F (36.4 C)   Ht 5' 5 (1.651 m)   Wt 193 lb (87.5 kg)   LMP 05/20/2011   SpO2 98%   BMI 32.12 kg/m   Vitals:   11/12/23 1354  BP: 130/88  Pulse: (!) 107  Temp: (!) 97.5 F (36.4 C)  Height: 5' 5 (1.651 m)  Weight: 193 lb (87.5 kg)  SpO2: 98%  BMI (Calculated): 32.12    Physical Exam Vitals reviewed.  Constitutional:      General: She is not in acute distress. HENT:     Head: Normocephalic.     Nose: Nose normal.     Mouth/Throat:     Mouth: Mucous membranes are moist.  Eyes:     Extraocular Movements: Extraocular movements intact.     Pupils: Pupils are equal, round, and reactive to light.  Neck:     Vascular: No carotid bruit.  Cardiovascular:     Rate and Rhythm: Normal rate and regular rhythm.     Heart sounds: No murmur heard. Pulmonary:     Effort: Pulmonary effort is normal.     Breath  sounds: No rhonchi or rales.  Abdominal:     General: Bowel sounds are normal.     Palpations: Abdomen is soft. There is no hepatomegaly, splenomegaly or mass.     Comments: Stimulator in right gluteus  Musculoskeletal:        General: Normal range of motion.     Cervical back: Normal range of motion. No tenderness.  Skin:    General: Skin is warm and dry.  Neurological:     General: No focal deficit present.     Mental Status: She is alert and oriented to person, place, and time.     Cranial Nerves: No cranial nerve deficit.     Motor: No weakness.  Psychiatric:        Mood and Affect: Mood normal.        Behavior: Behavior normal.      No results found for any visits on 11/12/23.  Recent Results (from the past 2160 hours)  CBC With Diff/Platelet     Status: Abnormal   Collection Time: 11/08/23  9:50 AM  Result Value Ref Range   WBC 6.0 3.4 - 10.8 x10E3/uL   RBC 3.96 3.77 - 5.28 x10E6/uL   Hemoglobin 13.2 11.1 - 15.9 g/dL   Hematocrit 60.6 65.9 - 46.6 %   MCV 99 (H) 79 - 97 fL   MCH 33.3 (H) 26.6 - 33.0 pg   MCHC 33.6 31.5 - 35.7 g/dL   RDW 85.8 88.2 - 84.5 %   Platelets 297 150 - 450 x10E3/uL   Neutrophils 51 Not Estab. %   Lymphs 35 Not Estab. %   Monocytes 9 Not Estab. %   Eos 3 Not Estab. %   Basos 1 Not Estab. %   Neutrophils Absolute 3.1 1.4 - 7.0 x10E3/uL   Lymphocytes Absolute 2.1 0.7 - 3.1 x10E3/uL   Monocytes Absolute 0.6 0.1 - 0.9 x10E3/uL   EOS (ABSOLUTE) 0.2 0.0 - 0.4 x10E3/uL   Basophils Absolute 0.1 0.0 - 0.2 x10E3/uL   Immature Granulocytes 0 Not Estab. %   Immature Grans (Abs) 0.0 0.0 - 0.1 x10E3/uL  Comprehensive metabolic panel with GFR     Status: Abnormal   Collection Time: 11/08/23  9:50 AM  Result Value Ref Range   Glucose 99 70 - 99 mg/dL   BUN 5 (L) 6 - 24 mg/dL   Creatinine, Ser 9.31 0.57 - 1.00 mg/dL   eGFR 894 >40 fO/fpw/8.26   BUN/Creatinine Ratio 7 (L) 9 - 23   Sodium 138 134 - 144 mmol/L   Potassium 5.1 3.5 - 5.2 mmol/L    Chloride 100 96 - 106 mmol/L   CO2 23 20 - 29 mmol/L   Calcium 9.7 8.7 - 10.2 mg/dL   Total Protein 7.1 6.0 - 8.5 g/dL   Albumin 4.6 3.8 - 4.9 g/dL   Globulin, Total 2.5 1.5 - 4.5 g/dL   Bilirubin Total 0.3 0.0 - 1.2 mg/dL   Alkaline Phosphatase 44 (L) 49 - 135 IU/L   AST 16 0 - 40 IU/L   ALT 16 0 - 32  IU/L  Lipid panel     Status: None   Collection Time: 11/08/23  9:50 AM  Result Value Ref Range   Cholesterol, Total 173 100 - 199 mg/dL   Triglycerides 89 0 - 149 mg/dL   HDL 69 >60 mg/dL   VLDL Cholesterol Cal 16 5 - 40 mg/dL   LDL Chol Calc (NIH) 88 0 - 99 mg/dL   Chol/HDL Ratio 2.5 0.0 - 4.4 ratio    Comment:                                   T. Chol/HDL Ratio                                             Men  Women                               1/2 Avg.Risk  3.4    3.3                                   Avg.Risk  5.0    4.4                                2X Avg.Risk  9.6    7.1                                3X Avg.Risk 23.4   11.0   Hemoglobin A1c     Status: Abnormal   Collection Time: 11/08/23  9:50 AM  Result Value Ref Range   Hgb A1c MFr Bld 5.7 (H) 4.8 - 5.6 %    Comment:          Prediabetes: 5.7 - 6.4          Diabetes: >6.4          Glycemic control for adults with diabetes: <7.0    Est. average glucose Bld gHb Est-mCnc 117 mg/dL      Assessment & Plan:  Lakenzie was seen today for follow-up.  Seasonal allergic rhinitis due to pollen -     Fluticasone  Propionate; Place 1 spray into both nostrils daily.  Dispense: 16 mL; Refill: 2  Mild intermittent asthma without complication -     Fluticasone -Salmeterol; Inhale 1 puff into the lungs in the morning and at bedtime.  Dispense: 60 each; Refill: 2  Lumbar pain -     Cyclobenzaprine  HCl; Take 1 tablet (10 mg total) by mouth 3 (three) times daily.  Dispense: 90 tablet; Refill: 2  Paroxysmal tachycardia (HCC)  Other orders -     Polysaccharide Iron Complex; Take 1 capsule (150 mg total) by mouth daily.  Dispense:  90 capsule; Refill: 0   Low ferritin, Cardiology think may be contributing to her tachycardia. Will trial 3 mo of Niferex. Problem List Items Addressed This Visit       Cardiovascular and Mediastinum   Paroxysmal tachycardia (HCC)     Respiratory   Allergic rhinitis - Primary   Relevant Medications   fluticasone  (FLONASE ) 50 MCG/ACT nasal  spray   Asthma   Relevant Medications   fluticasone -salmeterol (ADVAIR) 250-50 MCG/ACT AEPB     Other   Lumbar pain   Relevant Medications   cyclobenzaprine  (FLEXERIL ) 10 MG tablet    Return in about 3 months (around 02/12/2024) for fu with labs prior.   Total time spent: 20 minutes  Sherrill Cinderella Perry, MD  11/12/2023   This document may have been prepared by [facility] Voice Recognition software and as such may include unintentional dictation errors.

## 2023-11-12 NOTE — Progress Notes (Shared)
 [facility] - Clinic Note  11/26/2023     CHIEF COMPLAINT Patient presents for No chief complaint on file.    HISTORY OF PRESENT ILLNESS: April [name] is a 53 y.o. female who presents to the clinic today for:    Pt states her vision is doing well, pt states she noticed last year after she got the dilation drops her heart started racing, she states the same thing happened when she got them this year as well, she states she has diastolic heart failure, she states she also has edema on her legs, she states she stopped taking Elmiron  last year, but then had major bladder issues, so she had to go back on it, pt states she recently had an outbreak of shingles, she states they were in her mouth, she is still taking Valtrex  1000mg  TID    Referring physician: Albina GORMAN Dine, MD [address] [place],  [place] [address]  HISTORICAL INFORMATION:   Selected notes from the MEDICAL RECORD NUMBER Retinal Eval - Referred by Dr. GORMAN Dine Tejan-Sie Pt has been on urology med, Elmirom, for over 5 yrs.  Eval for potential retinal damage.   CURRENT MEDICATIONS: No current outpatient medications on file. (Ophthalmic Drugs)   No current facility-administered medications for this visit. (Ophthalmic Drugs)   Current Outpatient Medications (Other)  Medication Sig   Acetaminophen  (TYLENOL  PO) Take by mouth as needed.   albuterol  (VENTOLIN  HFA) 108 (90 Base) MCG/ACT inhaler Inhale 1 puff into the lungs every 6 (six) hours as needed for wheezing or shortness of breath.   azelastine  (ASTELIN ) 0.1 % nasal spray Place 2 sprays into both nostrils 2 (two) times daily.   Cholecalciferol (VITAMIN D3) 5000 units TABS Take by mouth daily.   Cyanocobalamin  (VITAMIN B-12) 5000 MCG SUBL Take 5,000 mcg by mouth daily.   cyclobenzaprine  (FLEXERIL ) 10 MG tablet Take 1 tablet (10 mg total) by mouth 3 (three) times daily.   diazepam  (VALIUM ) 10 MG tablet Take 10 mg by mouth daily.   dicyclomine   (BENTYL ) 20 MG tablet TAKE 1 TABLET BY MOUTH FOUR TIMES DAILY AS DIRECTED   ELMIRON  100 MG capsule Take 200 mg by mouth 2 (two) times daily.   estradiol (ESTRACE) 0.1 MG/GM vaginal cream pea sized amount to external vagina 2 times weekly   fexofenadine (ALLEGRA) 180 MG tablet Take 180 mg by mouth every morning.   fluticasone  (FLONASE ) 50 MCG/ACT nasal spray Place 1 spray into both nostrils daily.   fluticasone -salmeterol (ADVAIR) 250-50 MCG/ACT AEPB Inhale 1 puff into the lungs in the morning and at bedtime.   furosemide  (LASIX ) 20 MG tablet Take 1 tablet (20 mg total) by mouth daily.   gabapentin (NEURONTIN) 800 MG tablet Take 800 mg by mouth 3 (three) times daily.   HYDROcodone -acetaminophen  (NORCO/VICODIN) 5-325 MG tablet Take by mouth. (Patient taking differently: Take 1 tablet by mouth every 6 (six) hours as needed.)   hydrOXYzine  (ATARAX /VISTARIL ) 50 MG tablet Take 50 mg by mouth at bedtime.   hyoscyamine (LEVSIN SL) 0.125 MG SL tablet DISSOLVE 1 TABLET UNDER TONGUE EVERY 4 HOURS AS NEEDED FOR CRAMPING   MAY USE BY MOUTH OR UNDER THE TONGUE   IBUPROFEN  PO Take by mouth as needed.   iron polysaccharides (NIFEREX) 150 MG capsule Take 1 capsule (150 mg total) by mouth daily.   ivabradine (CORLANOR) 5 MG TABS tablet Take 5 mg by mouth 2 (two) times daily with a meal.   Lactobacillus (ACIDOPHILUS PROBIOTIC PO) Take  by mouth 3 (three) times daily.   lidocaine  (XYLOCAINE ) 5 % ointment Apply topically daily.   montelukast (SINGULAIR) 10 MG tablet Take 10 mg by mouth at bedtime.   nitrofurantoin (MACRODANTIN) 50 MG capsule Take 50 mg by mouth daily.   ondansetron  (ZOFRAN -ODT) 4 MG disintegrating tablet TAKE ONE TABLET BY MOUTH EVERY 8 HOURS AS NEEDED FOR NAUSEA / VOMITING   oxybutynin  (DITROPAN ) 5 MG tablet Take 5 mg by mouth 3 (three) times daily as needed.   PROCTOZONE-HC 2.5 % rectal cream Apply topically as needed.   pseudoephedrine  (SUDAFED) 30 MG tablet Take 30 mg by mouth every 6 (six) hours  as needed.   Simethicone  125 MG CAPS Take 125 mg by mouth 4 (four) times daily.   valACYclovir  (VALTREX ) 1000 MG tablet TAKE ONE TABLET BY MOUTH TWICE DAILY   valACYclovir  (VALTREX ) 500 MG tablet TAKE ONE TABLET TWICE DAILY   Zinc 50 MG TABS Take 50 mg by mouth daily.   No current facility-administered medications for this visit. (Other)   REVIEW OF SYSTEMS:    ALLERGIES Allergies  Allergen Reactions   Amitriptyline Other (See Comments)    Other reaction(s): Other  Change in mental status   Amoxicillin Rash   Bromelains     Other reaction(s): Abdominal pain   Celexa [Citalopram Hydrobromide] Other (See Comments)    Other reaction(s): Other Change in mental status   Nortriptyline     Other reaction(s): Other   Penicillins Swelling and Rash   Pregabalin     Other reaction(s): Myalgias (muscle pain)   Bromelains [Pineapple Extract] Other (See Comments)    Severe pain   Budesonide-Formoterol Fumarate     Other reaction(s): Other   Citalopram Other (See Comments)   Mangifera Indica Swelling   Methylene Blue Other (See Comments)    Caused inflamed pancreas, per pt should not use   Sertraline Other (See Comments)    Other reaction(s): Other Suicidal thoughts   Sulfa Antibiotics Other (See Comments)   Sulfonamide Derivatives Other (See Comments)    excrutiating pain, patient states it caused her to have osteoarthritis   Xarelto  [Rivaroxaban ]    Bromaline Vallery Di Bromm] Rash   Other Rash    Mango    PAST MEDICAL HISTORY Past Medical History:  Diagnosis Date   Anxiety 12/14/2011   Asthma    no inhaler   Cataract    Diastolic dysfunction    a. 10/2015 Echo: EF nl, mod diast dysfxn. Mild MR/TR; b. 09/2016 Echo: EF 55-60%, no rwma. Nl RV size and fxn; c. 03/2020 Echo: EF 60-65%. No rwma.   Factor 5 Leiden mutation, heterozygous    Fibromyalgia    Fracture of 5th metatarsal 03/2011   left foot -     Heart palpitations    a. Notes intermittent tachycardia (sinus).  Did not tolerate beta blockers 2/2 urinary incontinence; b. 07/2017 Event monitor: Avg HR 77 (49-136). No significant ectopy/pauses. Triggered events corresponded w/ sinus rhythm, sinus arrhythmias, and artifact.   Interstitial cystitis    Non-cardiac chest pain    a. 12/2015 CTA chest: no PE. No significant coronary Ca2+. Mosaic attenuation pattern in both lungs, may reflect small airway dzs; c. 12/2015 Ex MV: Walked 6 mins. No ischemia/scar; d. 12/2019 Cor CTA: Ca2+ = 0. Nl cors. ? PFO w/o evidence of flow (not seen on echo).   Ovarian cyst    Pancreatitis    Pelvic floor dysfunction    Rheumatoid arteritis (HCC)    Sinus infection  chronic   Syncope    a. 08/2017 Event monitor: Average heart rate 77 (49-136).  Periods of sinus arrhythmia noted.  Triggered events corresponded with sinus rhythm, sinus arrhythmia, and artifact.  No significant ectopy, sustained arrhythmias, or prolonged pauses observed.   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   Past Surgical History:  Procedure Laterality Date   ABDOMINAL HYSTERECTOMY     CHOLECYSTECTOMY     CYSTOSCOPY WITH HYDRODISTENSION AND BIOPSY  04/21/2011   Dr Lamar [place]   LAPAROSCOPIC ASSISTED VAGINAL HYSTERECTOMY  08/21/2011   Procedure: LAPAROSCOPIC ASSISTED VAGINAL HYSTERECTOMY;  Surgeon: Rosaline LITTIE Cobble, MD;  Location: [facility];  Service: Gynecology;  Laterality: N/A;  OPEN LAPAROSCOPIC   LAPAROSCOPIC NISSEN FUNDOPLICATION  2007   neck ablation Left 12/29/2019   neck ablation Right 01/05/2020   SALPINGOOPHORECTOMY  08/21/2011   Procedure: SALPINGO OOPHERECTOMY;  Surgeon: Rosaline LITTIE Cobble, MD;  Location: [facility];  Service: Gynecology;  Laterality: Bilateral;   TUBAL LIGATION  2010   WISDOM TOOTH EXTRACTION      FAMILY HISTORY Family History  Problem Relation Age of Onset   Heart Problems Mother    Vascular Disease Mother    Skin  cancer Mother    Vascular Disease Maternal Grandmother    Kidney Stones Father    Skin cancer Maternal Uncle    Anesthesia problems Neg Hx     SOCIAL HISTORY Social History   Tobacco Use   Smoking status: Never   Smokeless tobacco: Never  Vaping Use   Vaping status: Never Used  Substance Use Topics   Alcohol use: No   Drug use: No       OPHTHALMIC EXAM: Not recorded    IMAGING AND PROCEDURES  Imaging and Procedures for @TODAY @          ASSESSMENT/PLAN:    ICD-10-CM   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 Myopia of both eyes with astigmatism  H52.13    H52.203     3. Combined forms of age-related cataract of both eyes  H25.813       1. History of long-term Elmiron  use -- no retinal toxicity or pigmentary retinopathy -- stable  - pt reports taking po Elmiron , 200 mg BID, since 2012  - pt reports that she temporarily stopped Elmiron  since last year, but restarted due to recurrence of bladder issues  - OCT and autofluorescence without RPE atrophy  - BCVA remains 20/20 OU  - Optos FAF images obtained 11.1.22  - f/u 1 year, DFE, OCT, Optos FAF  2. Myopia w/ astigmatism OU  3. 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  - discussed diagnosis and progression  - not yet visually significant   - monitor  Ophthalmic Meds Ordered this visit:  No orders of the defined types were placed in this encounter.    No follow-ups on file.  There are no Patient Instructions on file for this visit.  This document serves as a record of services personally performed by Redell JUDITHANN Hans, MD, PhD. It was created on their behalf by Avelina Pereyra, COA an ophthalmic technician. The creation of this record is the provider's dictation and/or activities during the visit.   Electronically signed by: Avelina GORMAN Pereyra, COT  11/12/23  4:13 PM  Redell JUDITHANN Hans, M.D., Ph.D. Diseases &  Surgery of the Retina and Vitreous [facility]     Abbreviations: M myopia (nearsighted); A astigmatism; H hyperopia (farsighted); P presbyopia; Mrx spectacle prescription;  CTL contact lenses; OD right eye; OS left eye; OU both eyes  XT exotropia; ET esotropia; PEK punctate epithelial keratitis; PEE punctate epithelial erosions; DES dry eye syndrome; MGD meibomian gland dysfunction; ATs artificial tears; PFAT's preservative free artificial tears; NSC nuclear sclerotic cataract; PSC posterior subcapsular cataract; ERM epi-retinal membrane; PVD posterior vitreous detachment; RD retinal detachment; DM diabetes mellitus; DR diabetic retinopathy; NPDR non-proliferative diabetic retinopathy; PDR proliferative diabetic retinopathy; CSME clinically significant macular edema; DME diabetic macular edema; dbh dot blot hemorrhages; CWS cotton wool spot; POAG primary open angle glaucoma; C/D cup-to-disc ratio; HVF humphrey visual field; GVF goldmann visual field; OCT optical coherence tomography; IOP intraocular pressure; BRVO Branch retinal vein occlusion; CRVO central retinal vein occlusion; CRAO central retinal artery occlusion; BRAO branch retinal artery occlusion; RT retinal tear; SB scleral buckle; PPV pars plana vitrectomy; VH Vitreous hemorrhage; PRP panretinal laser photocoagulation; IVK intravitreal kenalog; VMT vitreomacular traction; MH Macular hole;  NVD neovascularization of the disc; NVE neovascularization elsewhere; AREDS age related eye disease study; ARMD age related macular degeneration; POAG primary open angle glaucoma; EBMD epithelial/anterior basement membrane dystrophy; ACIOL anterior chamber intraocular lens; IOL intraocular lens; PCIOL posterior chamber intraocular lens; Phaco/IOL phacoemulsification with intraocular lens placement; PRK photorefractive keratectomy; LASIK laser assisted in situ keratomileusis; HTN hypertension; DM diabetes mellitus; COPD chronic obstructive  pulmonary disease

## 2023-11-14 DIAGNOSIS — M79641 Pain in right hand: Secondary | ICD-10-CM | POA: Diagnosis not present

## 2023-11-14 DIAGNOSIS — M797 Fibromyalgia: Secondary | ICD-10-CM | POA: Diagnosis not present

## 2023-11-14 DIAGNOSIS — M79642 Pain in left hand: Secondary | ICD-10-CM | POA: Diagnosis not present

## 2023-11-14 DIAGNOSIS — M79672 Pain in left foot: Secondary | ICD-10-CM | POA: Diagnosis not present

## 2023-11-14 DIAGNOSIS — M791 Myalgia, unspecified site: Secondary | ICD-10-CM | POA: Diagnosis not present

## 2023-11-14 DIAGNOSIS — M79671 Pain in right foot: Secondary | ICD-10-CM | POA: Diagnosis not present

## 2023-11-14 DIAGNOSIS — M2550 Pain in unspecified joint: Secondary | ICD-10-CM | POA: Diagnosis not present

## 2023-11-14 DIAGNOSIS — G894 Chronic pain syndrome: Secondary | ICD-10-CM | POA: Diagnosis not present

## 2023-11-15 DIAGNOSIS — D649 Anemia, unspecified: Secondary | ICD-10-CM | POA: Diagnosis not present

## 2023-11-15 DIAGNOSIS — K219 Gastro-esophageal reflux disease without esophagitis: Secondary | ICD-10-CM | POA: Diagnosis not present

## 2023-11-15 DIAGNOSIS — I4719 Other supraventricular tachycardia: Secondary | ICD-10-CM | POA: Diagnosis not present

## 2023-11-15 DIAGNOSIS — J45998 Other asthma: Secondary | ICD-10-CM | POA: Diagnosis not present

## 2023-11-15 DIAGNOSIS — Z8679 Personal history of other diseases of the circulatory system: Secondary | ICD-10-CM | POA: Diagnosis not present

## 2023-11-23 DIAGNOSIS — N3010 Interstitial cystitis (chronic) without hematuria: Secondary | ICD-10-CM | POA: Diagnosis not present

## 2023-11-26 ENCOUNTER — Encounter (INDEPENDENT_AMBULATORY_CARE_PROVIDER_SITE_OTHER): Payer: 59 | Admitting: Ophthalmology

## 2023-11-26 DIAGNOSIS — H52203 Unspecified astigmatism, bilateral: Secondary | ICD-10-CM

## 2023-11-26 DIAGNOSIS — H3552 Pigmentary retinal dystrophy: Secondary | ICD-10-CM

## 2023-11-26 DIAGNOSIS — H25813 Combined forms of age-related cataract, bilateral: Secondary | ICD-10-CM

## 2023-12-04 NOTE — Progress Notes (Shared)
 [facility] - Clinic Note  12/18/2023     CHIEF COMPLAINT Patient presents for No chief complaint on file.    HISTORY OF PRESENT ILLNESS: April [name] is a 53 y.o. female who presents to the clinic today for:    Pt states her vision is doing well, pt states she noticed last year after she got the dilation drops her heart started racing, she states the same thing happened when she got them this year as well, she states she has diastolic heart failure, she states she also has edema on her legs, she states she stopped taking Elmiron  last year, but then had major bladder issues, so she had to go back on it, pt states she recently had an outbreak of shingles, she states they were in her mouth, she is still taking Valtrex  1000mg  TID    Referring physician: Albina GORMAN Dine, MD [address] [place],  [place] [address]  HISTORICAL INFORMATION:   Selected notes from the MEDICAL RECORD NUMBER Retinal Eval - Referred by Dr. GORMAN Dine Tejan-Sie Pt has been on urology med, [place], for over 5 yrs.  Eval for potential retinal damage.   CURRENT MEDICATIONS: No current outpatient medications on file. (Ophthalmic Drugs)   No current facility-administered medications for this visit. (Ophthalmic Drugs)   Current Outpatient Medications (Other)  Medication Sig   Acetaminophen  (TYLENOL  PO) Take by mouth as needed.   albuterol  (VENTOLIN  HFA) 108 (90 Base) MCG/ACT inhaler Inhale 1 puff into the lungs every 6 (six) hours as needed for wheezing or shortness of breath.   azelastine  (ASTELIN ) 0.1 % nasal spray Place 2 sprays into both nostrils 2 (two) times daily.   Cholecalciferol (VITAMIN D3) 5000 units TABS Take by mouth daily.   Cyanocobalamin  (VITAMIN B-12) 5000 MCG SUBL Take 5,000 mcg by mouth daily.   cyclobenzaprine  (FLEXERIL ) 10 MG tablet Take 1 tablet (10 mg total) by mouth 3 (three) times daily.   diazepam  (VALIUM ) 10 MG tablet Take 10 mg by mouth daily.   dicyclomine   (BENTYL ) 20 MG tablet TAKE 1 TABLET BY MOUTH FOUR TIMES DAILY AS DIRECTED   ELMIRON  100 MG capsule Take 200 mg by mouth 2 (two) times daily.   estradiol (ESTRACE) 0.1 MG/GM vaginal cream pea sized amount to external vagina 2 times weekly   fexofenadine (ALLEGRA) 180 MG tablet Take 180 mg by mouth every morning.   fluticasone  (FLONASE ) 50 MCG/ACT nasal spray Place 1 spray into both nostrils daily.   fluticasone -salmeterol (ADVAIR) 250-50 MCG/ACT AEPB Inhale 1 puff into the lungs in the morning and at bedtime.   furosemide  (LASIX ) 20 MG tablet Take 1 tablet (20 mg total) by mouth daily.   gabapentin (NEURONTIN) 800 MG tablet Take 800 mg by mouth 3 (three) times daily.   HYDROcodone -acetaminophen  (NORCO/VICODIN) 5-325 MG tablet Take by mouth. (Patient taking differently: Take 1 tablet by mouth every 6 (six) hours as needed.)   hydrOXYzine  (ATARAX /VISTARIL ) 50 MG tablet Take 50 mg by mouth at bedtime.   hyoscyamine (LEVSIN SL) 0.125 MG SL tablet DISSOLVE 1 TABLET UNDER TONGUE EVERY 4 HOURS AS NEEDED FOR CRAMPING   MAY USE BY MOUTH OR UNDER THE TONGUE   IBUPROFEN  PO Take by mouth as needed.   iron polysaccharides (NIFEREX) 150 MG capsule Take 1 capsule (150 mg total) by mouth daily.   ivabradine (CORLANOR) 5 MG TABS tablet Take 5 mg by mouth 2 (two) times daily with a meal.   Lactobacillus (ACIDOPHILUS PROBIOTIC PO) Take  by mouth 3 (three) times daily.   lidocaine  (XYLOCAINE ) 5 % ointment Apply topically daily.   montelukast (SINGULAIR) 10 MG tablet Take 10 mg by mouth at bedtime.   nitrofurantoin (MACRODANTIN) 50 MG capsule Take 50 mg by mouth daily.   ondansetron  (ZOFRAN -ODT) 4 MG disintegrating tablet TAKE ONE TABLET BY MOUTH EVERY 8 HOURS AS NEEDED FOR NAUSEA / VOMITING   oxybutynin  (DITROPAN ) 5 MG tablet Take 5 mg by mouth 3 (three) times daily as needed.   PROCTOZONE-HC 2.5 % rectal cream Apply topically as needed.   pseudoephedrine  (SUDAFED) 30 MG tablet Take 30 mg by mouth every 6 (six) hours  as needed.   Simethicone  125 MG CAPS Take 125 mg by mouth 4 (four) times daily.   valACYclovir  (VALTREX ) 1000 MG tablet TAKE ONE TABLET BY MOUTH TWICE DAILY   valACYclovir  (VALTREX ) 500 MG tablet TAKE ONE TABLET TWICE DAILY   Zinc 50 MG TABS Take 50 mg by mouth daily.   No current facility-administered medications for this visit. (Other)   REVIEW OF SYSTEMS:    ALLERGIES Allergies  Allergen Reactions   Amitriptyline Other (See Comments)    Other reaction(s): Other  Change in mental status   Amoxicillin Rash   Bromelains     Other reaction(s): Abdominal pain   Celexa [Citalopram Hydrobromide] Other (See Comments)    Other reaction(s): Other Change in mental status   Nortriptyline     Other reaction(s): Other   Penicillins Swelling and Rash   Pregabalin     Other reaction(s): Myalgias (muscle pain)   Bromelains [Pineapple Extract] Other (See Comments)    Severe pain   Budesonide-Formoterol Fumarate     Other reaction(s): Other   Citalopram Other (See Comments)   Mangifera Indica Swelling   Methylene Blue Other (See Comments)    Caused inflamed pancreas, per pt should not use   Sertraline Other (See Comments)    Other reaction(s): Other Suicidal thoughts   Sulfa Antibiotics Other (See Comments)   Sulfonamide Derivatives Other (See Comments)    excrutiating pain, patient states it caused her to have osteoarthritis   Xarelto  [Rivaroxaban ]    Bromaline Vallery Di Bromm] Rash   Other Rash    Mango    PAST MEDICAL HISTORY Past Medical History:  Diagnosis Date   Anxiety 12/14/2011   Asthma    no inhaler   Cataract    Diastolic dysfunction    a. 10/2015 Echo: EF nl, mod diast dysfxn. Mild MR/TR; b. 09/2016 Echo: EF 55-60%, no rwma. Nl RV size and fxn; c. 03/2020 Echo: EF 60-65%. No rwma.   Factor 5 Leiden mutation, heterozygous    Fibromyalgia    Fracture of 5th metatarsal 03/2011   left foot -     Heart palpitations    a. Notes intermittent tachycardia (sinus).  Did not tolerate beta blockers 2/2 urinary incontinence; b. 07/2017 Event monitor: Avg HR 77 (49-136). No significant ectopy/pauses. Triggered events corresponded w/ sinus rhythm, sinus arrhythmias, and artifact.   Interstitial cystitis    Non-cardiac chest pain    a. 12/2015 CTA chest: no PE. No significant coronary Ca2+. Mosaic attenuation pattern in both lungs, may reflect small airway dzs; c. 12/2015 Ex MV: Walked 6 mins. No ischemia/scar; d. 12/2019 Cor CTA: Ca2+ = 0. Nl cors. ? PFO w/o evidence of flow (not seen on echo).   Ovarian cyst    Pancreatitis    Pelvic floor dysfunction    Rheumatoid arteritis (HCC)    Sinus infection  chronic   Syncope    a. 08/2017 Event monitor: Average heart rate 77 (49-136).  Periods of sinus arrhythmia noted.  Triggered events corresponded with sinus rhythm, sinus arrhythmia, and artifact.  No significant ectopy, sustained arrhythmias, or prolonged pauses observed.   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   Past Surgical History:  Procedure Laterality Date   ABDOMINAL HYSTERECTOMY     CHOLECYSTECTOMY     CYSTOSCOPY WITH HYDRODISTENSION AND BIOPSY  04/21/2011   Dr Lamar [place]   LAPAROSCOPIC ASSISTED VAGINAL HYSTERECTOMY  08/21/2011   Procedure: LAPAROSCOPIC ASSISTED VAGINAL HYSTERECTOMY;  Surgeon: Rosaline LITTIE Cobble, MD;  Location: [facility];  Service: Gynecology;  Laterality: N/A;  OPEN LAPAROSCOPIC   LAPAROSCOPIC NISSEN FUNDOPLICATION  2007   neck ablation Left 12/29/2019   neck ablation Right 01/05/2020   SALPINGOOPHORECTOMY  08/21/2011   Procedure: SALPINGO OOPHERECTOMY;  Surgeon: Rosaline LITTIE Cobble, MD;  Location: [facility];  Service: Gynecology;  Laterality: Bilateral;   TUBAL LIGATION  2010   WISDOM TOOTH EXTRACTION      FAMILY HISTORY Family History  Problem Relation Age of Onset   Heart Problems Mother    Vascular Disease Mother    Skin  cancer Mother    Vascular Disease Maternal Grandmother    Kidney Stones Father    Skin cancer Maternal Uncle    Anesthesia problems Neg Hx     SOCIAL HISTORY Social History   Tobacco Use   Smoking status: Never   Smokeless tobacco: Never  Vaping Use   Vaping status: Never Used  Substance Use Topics   Alcohol use: No   Drug use: No       OPHTHALMIC EXAM: Not recorded    IMAGING AND PROCEDURES  Imaging and Procedures for @TODAY @          ASSESSMENT/PLAN:  No diagnosis found.   1. History of long-term Elmiron  use -- no retinal toxicity or pigmentary retinopathy -- stable  - pt reports taking po Elmiron , 200 mg BID, since 2012  - pt reports that she temporarily stopped Elmiron  since last year, but restarted due to recurrence of bladder issues  - OCT and autofluorescence without RPE atrophy  - BCVA remains 20/20 OU  - Optos FAF images obtained 11.1.22  - f/u 1 year, DFE, OCT, Optos FAF  2. Myopia w/ astigmatism OU  3. 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  - discussed diagnosis and progression  - not yet visually significant   - monitor  Ophthalmic Meds Ordered this visit:  No orders of the defined types were placed in this encounter.    No follow-ups on file.  There are no Patient Instructions on file for this visit.  This document serves as a record of services personally performed by Redell JUDITHANN Hans, MD, PhD. It was created on their behalf by Wanda GEANNIE Keens, COT an ophthalmic technician. The creation of this record is the provider's dictation and/or activities during the visit.    Electronically signed by:  Wanda GEANNIE Keens, COT  12/04/23 12:19 PM    Redell JUDITHANN Hans, M.D., Ph.D. Diseases & Surgery of the Retina and Vitreous [facility]     Abbreviations: M myopia (nearsighted); A astigmatism; H hyperopia  (farsighted); P presbyopia; Mrx spectacle prescription;  CTL contact lenses; OD right eye; OS left eye; OU both eyes  XT exotropia; ET esotropia; PEK punctate epithelial keratitis; PEE punctate epithelial erosions; DES dry eye syndrome; MGD meibomian gland dysfunction; ATs artificial tears; PFAT's preservative free artificial tears; NSC nuclear sclerotic cataract; PSC posterior subcapsular cataract; ERM epi-retinal membrane; PVD posterior vitreous detachment; RD retinal detachment; DM diabetes mellitus; DR diabetic retinopathy; NPDR non-proliferative diabetic retinopathy; PDR proliferative diabetic retinopathy; CSME clinically significant macular edema; DME diabetic macular edema; dbh dot blot hemorrhages; CWS cotton wool spot; POAG primary open angle glaucoma; C/D cup-to-disc ratio; HVF humphrey visual field; GVF goldmann visual field; OCT optical coherence tomography; IOP intraocular pressure; BRVO Branch retinal vein occlusion; CRVO central retinal vein occlusion; CRAO central retinal artery occlusion; BRAO branch retinal artery occlusion; RT retinal tear; SB scleral buckle; PPV pars plana vitrectomy; VH Vitreous hemorrhage; PRP panretinal laser photocoagulation; IVK intravitreal kenalog; VMT vitreomacular traction; MH Macular hole;  NVD neovascularization of the disc; NVE neovascularization elsewhere; AREDS age related eye disease study; ARMD age related macular degeneration; POAG primary open angle glaucoma; EBMD epithelial/anterior basement membrane dystrophy; ACIOL anterior chamber intraocular lens; IOL intraocular lens; PCIOL posterior chamber intraocular lens; Phaco/IOL phacoemulsification with intraocular lens placement; PRK photorefractive keratectomy; LASIK laser assisted in situ keratomileusis; HTN hypertension; DM diabetes mellitus; COPD chronic obstructive pulmonary disease

## 2023-12-05 ENCOUNTER — Other Ambulatory Visit: Payer: Self-pay

## 2023-12-05 ENCOUNTER — Emergency Department
Admission: EM | Admit: 2023-12-05 | Discharge: 2023-12-05 | Disposition: A | Discharge status: Home / Self Care | Attending: Emergency Medicine | Admitting: Emergency Medicine

## 2023-12-05 ENCOUNTER — Emergency Department

## 2023-12-05 DIAGNOSIS — R55 Syncope and collapse: Secondary | ICD-10-CM | POA: Insufficient documentation

## 2023-12-05 DIAGNOSIS — M25571 Pain in right ankle and joints of right foot: Secondary | ICD-10-CM | POA: Diagnosis not present

## 2023-12-05 DIAGNOSIS — S82851A Displaced trimalleolar fracture of right lower leg, initial encounter for closed fracture: Secondary | ICD-10-CM | POA: Insufficient documentation

## 2023-12-05 DIAGNOSIS — W19XXXA Unspecified fall, initial encounter: Secondary | ICD-10-CM | POA: Insufficient documentation

## 2023-12-05 DIAGNOSIS — S99911A Unspecified injury of right ankle, initial encounter: Secondary | ICD-10-CM | POA: Diagnosis present

## 2023-12-05 LAB — COMPREHENSIVE METABOLIC PANEL WITH GFR
ALT: 106 U/L — ABNORMAL HIGH (ref 0–44)
AST: 189 U/L — ABNORMAL HIGH (ref 15–41)
Albumin: 3.9 g/dL (ref 3.5–5.0)
Alkaline Phosphatase: 37 U/L — ABNORMAL LOW (ref 38–126)
Anion gap: 11 (ref 5–15)
BUN: 9 mg/dL (ref 6–20)
CO2: 21 mmol/L — ABNORMAL LOW (ref 22–32)
Calcium: 8.2 mg/dL — ABNORMAL LOW (ref 8.9–10.3)
Chloride: 104 mmol/L (ref 98–111)
Creatinine, Ser: 0.54 mg/dL (ref 0.44–1.00)
GFR, Estimated: >60 mL/min (ref >60)
Glucose, Bld: 121 mg/dL — ABNORMAL HIGH (ref 70–99)
Potassium: 4.4 mmol/L (ref 3.5–5.1)
Sodium: 136 mmol/L (ref 135–145)
Total Bilirubin: 0.7 mg/dL (ref 0.0–1.2)
Total Protein: 6.6 g/dL (ref 6.5–8.1)

## 2023-12-05 LAB — CBC
HCT: 37.5 % (ref 36.0–46.0)
Hemoglobin: 12.4 g/dL (ref 12.0–15.0)
MCH: 32.8 pg (ref 26.0–34.0)
MCHC: 33.1 g/dL (ref 30.0–36.0)
MCV: 99.2 fL (ref 80.0–100.0)
Platelets: 287 K/uL (ref 150–400)
RBC: 3.78 MIL/uL — ABNORMAL LOW (ref 3.87–5.11)
RDW: 15.1 % (ref 11.5–15.5)
WBC: 15.4 K/uL — ABNORMAL HIGH (ref 4.0–10.5)
nRBC: 0 % (ref 0.0–0.2)

## 2023-12-05 LAB — CK: Total CK: 46 U/L (ref 38–234)

## 2023-12-05 MED ORDER — SODIUM CHLORIDE 0.9 % IV BOLUS
1000.0000 mL | Freq: Once | INTRAVENOUS | Status: AC
  Administered 2023-12-05: 1000 mL via INTRAVENOUS

## 2023-12-05 MED ORDER — ONDANSETRON HCL 4 MG/2ML IJ SOLN
4.0000 mg | Freq: Once | INTRAMUSCULAR | Status: AC
  Administered 2023-12-05: 4 mg via INTRAVENOUS
  Filled 2023-12-05: qty 2

## 2023-12-05 MED ORDER — OXYCODONE-ACETAMINOPHEN 5-325 MG PO TABS: 1.0000 | ORAL_TABLET | ORAL | 0 refills | Status: DC | PRN

## 2023-12-05 MED ORDER — MORPHINE SULFATE (PF) 4 MG/ML IV SOLN: 4.0000 mg | Freq: Once | INTRAVENOUS | Status: DC

## 2023-12-05 MED ORDER — HYDROMORPHONE HCL 1 MG/ML IJ SOLN
1.0000 mg | Freq: Once | INTRAMUSCULAR | Status: AC
  Administered 2023-12-05: 1 mg via INTRAVENOUS
  Filled 2023-12-05: qty 1

## 2023-12-05 MED ORDER — OXYBUTYNIN CHLORIDE 5 MG PO TABS
5.0000 mg | ORAL_TABLET | Freq: Once | ORAL | Status: AC
  Administered 2023-12-05: 5 mg via ORAL
  Filled 2023-12-05: qty 1

## 2023-12-05 NOTE — TOC Initial Note (Addendum)
 Transition of Care [facility]) - Initial/Assessment Note    Patient Details  Name: April [name] MRN: [id number] Date of Birth: [birth year]  Transition of Care [facility]) CM/SW Contact:    Jessicah Croll L Aloys Hupfer, LCSW Phone Number: 12/05/2023, 11:41 AM  Clinical Narrative:                  [facility] consult received for DME. Secure chat received from provide with specific needs for wheelchair. CSW provided verbiage needed for [employer] to process order.   [employer] has been notified of DME need. DME will be delivered to patient room once processed.     12:42pm: Update received. DME will be delivered to the home. Patient and spouse feel confident that patient can discharge home with the spouses assistance.    Patient Goals and CMS Choice            Expected Discharge Plan and Services                                              Prior Living Arrangements/Services                       Activities of Daily Living      Permission Sought/Granted                  Emotional Assessment              Admission diagnosis:  Syncope; Ankle Injury Patient Active Problem List   Diagnosis Date Noted   Paroxysmal tachycardia (HCC) 11/12/2023   Herpes zoster without complication 08/08/2022   Prediabetes 04/25/2022   Obesity (BMI 30-39.9) 04/25/2022   Hyponatremia 06/17/2021   Hypokalemia 06/17/2021   Syncope 08/02/2017   Chronic headaches (primary) 07/24/2016   Lumbar pain 07/24/2016   Right foot pain (tertiary) 07/24/2016   Long term current use of opiate analgesic 07/13/2016   Chronic pain syndrome 07/13/2016   Opiate use 07/13/2016   Long term prescription opiate use 07/13/2016   Neck pain 07/13/2016   Knee pain, chronic 07/13/2016   Chronic hand pain 06/12/2016   Inflammatory arthritis 06/12/2016   Atypical chest pain 02/04/2016   Diastolic dysfunction 02/04/2016   Varicose veins of lower extremity 12/17/2015   Palpitations    Hemorrhoid 10/05/2015   Arthritis  05/27/2015   At risk for polypharmacy 05/27/2015   Chronic mastitis 05/27/2015   Cyst of breast 05/27/2015   Cyst of ovary 05/27/2015   Gastric reflux 05/27/2015   Hematuria syndrome 05/27/2015   History of migraine 05/27/2015   Hypercoagulable state 05/27/2015   Increased frequency of urination 05/27/2015   Pelvic floor dysfunction 05/27/2015   Rectocele, baby delivered 05/27/2015   Spasm of bladder 05/27/2015   Venous thrombosis of lower extremity 05/27/2015   Venous embolism 05/27/2015   Ovarian cyst, left 01/02/2015   Anemia 11/05/2014   Polypharmacy 09/22/2014   Breast cyst 08/28/2014   Mastitis chronic 08/25/2014   Rectocele 05/26/2014   Asthma 04/23/2014   Medication management 11/19/2013   Headache 08/27/2013   Numbness 08/27/2013   Bladder spasms 04/13/2013   Allergic rhinitis 01/21/2013   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/2013   Nasal septal deviation 12/10/2012   Sinusitis 12/10/2012   High-tone pelvic floor dysfunction 10/09/2012   Female stress incontinence 03/06/2012   Sensory disturbance 12/16/2011   Skin sensation disturbance 12/16/2011   Chronic  interstitial cystitis 12/15/2011   Hematuria 12/15/2011   Acute DVT (deep venous thrombosis)-right gonadal 12/14/2011   SOB (shortness of breath) on exertion 12/14/2011   Factor V Leiden mutation 12/14/2011   Anxiety 12/14/2011   Fibromyalgia 12/14/2011   Thrombosis 12/14/2011   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 [place] [address] Phone: [phone] Fax: [phone]     Social Drivers of Health (SDOH) Social History: SDOH Screenings   Food Insecurity: Low Risk  (04/19/2023)    Received from [facility]  Housing: Low Risk  (04/19/2023)   Received from [facility]  Transportation Needs: No Transportation Needs (04/19/2023)   Received from [facility]  Utilities: Low Risk  (04/19/2023)   Received from [facility]  Depression (PHQ2-9): Low Risk  (07/13/2023)  Social Connections: Unknown (05/19/2022)   Received from [facility]  Tobacco Use: Low Risk  (11/23/2023)   Received from [facility]   SDOH Interventions:     Readmission Risk Interventions     No data to display

## 2023-12-05 NOTE — ED Provider Notes (Signed)
 [facility] Provider Note    Event Date/Time   First MD Initiated Contact with Patient 12/05/23 [phone]     (approximate)  History   Chief Complaint: Loss of Consciousness  HPI  April [name] is a 53 y.o. female with a past medical history of anxiety, fibromyalgia, recurrent syncope, presents to the emergency department for right ankle pain and a syncopal episode.  According to the patient she was having an upset stomach was trying to get to the restroom when she believes she may have had a syncopal or near syncopal event that caused her to fall.  Patient fell on top of her right leg/ankle.  Patient states significant pain to the right ankle she was unable to stand or get up due to pain.  Was on the bathroom floor for approximately 3 to 4 hours before the husband woke up and found the patient on the floor.  EMS was called and brought the patient to the emergency department.  Patient has an obvious deformity and swelling to the right ankle.  EMS gave the patient 150 mcg of fentanyl  prior to arrival which did cause a brief episode of hypotension which resolved within a couple minutes.  Patient is also complaining of some abdominal discomfort which she states is chronic and usually resolves with a heating pad.  Physical Exam   Triage Vital Signs: ED Triage Vitals  Encounter Vitals Group     BP 12/05/23 0759 110/70     Girls Systolic BP Percentile --      Girls Diastolic BP Percentile --      Boys Systolic BP Percentile --      Boys Diastolic BP Percentile --      Pulse Rate 12/05/23 0759 77     Resp 12/05/23 0759 12     Temp 12/05/23 0759 98.1 F (36.7 C)     Temp Source 12/05/23 0759 Oral     SpO2 12/05/23 0759 97 %     Weight 12/05/23 0825 189 lb (85.7 kg)     Height 12/05/23 0825 5' 5 (1.651 m)     Head Circumference --      [place] Flow --      Pain Score --      Pain Loc --      Pain Education --      Exclude from Growth Chart --     Most recent vital  signs: Vitals:   12/05/23 0759  BP: 110/70  Pulse: 77  Resp: 12  Temp: 98.1 F (36.7 C)  SpO2: 97%    General: Awake, no distress.  CV:  Good peripheral perfusion.  Regular rate and rhythm  Resp:  Normal effort.  Equal breath sounds bilaterally.  Abd:  No distention.  Soft, nontender.  No rebound or guarding. Other:  Patient has moderate swelling ecchymosis of the right ankle with significant tenderness to palpation.  Neurovascularly intact distally.   ED Results / Procedures / Treatments   EKG  EKG viewed and interpreted by myself shows a sinus rhythm at 78 bpm with a narrow QRS, normal axis, normal intervals, no concerning ST changes.  RADIOLOGY  I have reviewed interpreted the ankle x-ray images concerning for trimalleolar fracture.   MEDICATIONS ORDERED IN ED: Medications  morphine  (PF) 4 MG/ML injection 4 mg (has no administration in time range)  ondansetron  (ZOFRAN ) injection 4 mg (has no administration in time range)  sodium chloride  0.9 % bolus 1,000 mL (has no administration in  time range)     IMPRESSION / MDM / ASSESSMENT AND PLAN / ED COURSE  I reviewed the triage vital signs and the nursing notes.  Patient's presentation is most consistent with acute presentation with potential threat to life or bodily function.  Patient presents to the emergency department after a syncopal or near syncopal episode earlier this morning resulting in a right ankle injury.  In speaking to the patient it appears that the syncopal episodes are recurrent in nature.  Patient has an ankle deformity was unable to get off the bathroom floor for 3 to 4 hours.  We will check labs including a CK we will IV hydrate we will treat pain and nausea and obtain x-ray images of the right ankle.  We will continue to closely monitor in the emergency department.  Patient is complaining of some abdominal discomfort which she states is chronic and usually resolves with a heating pad.  Will attempt to  obtain a heating pad for the patient.  CBC shows leukocytosis of 15,000. Ankle x-ray shows trimalleolar fracture.  I spoke with Dr. Malvin of podiatry he states given the amount of swelling they would prefer to wait a week or so before operating.  We will place the patient in a posterior splint with stirrups.  Patient would be nonweightbearing.  Patient states she is unable to use a wheelchair or crutches due to balance issues and has led to falls in the past.  Patient would benefit from a wheelchair.  Will discuss with social work to see if we can arrange for a wheelchair for the patient.  Patient would then be discharged home nonweightbearing with pain medication to follow-up with dietary for operation.  Patient's ankle has been splinted.  In patient still able to feel all of her toes move all of her toes.  Neurovascularly intact distally.  Patient will follow-up with podiatry.  They have already called the patient and have them scheduled for Friday.  I worked with the child psychotherapist and they will be arranging for a wheelchair to be delivered to the patient's home.  We will prescribe Percocet for pain control patient is on chronic hydrocodone  but she does not believe that will be adequate to control her pain.  She understands she is only to take 1 or the other and not both in the same day.  FINAL CLINICAL IMPRESSION(S) / ED DIAGNOSES   Right trimalleolar ankle fracture   Note:  This document was prepared using Dragon voice recognition software and may include unintentional dictation errors.   Dorothyann Drivers, MD 12/05/23 1243

## 2023-12-05 NOTE — ED Provider Notes (Addendum)
-----------------------------------------   11:40 AM on 12/05/2023 ----------------------------------------- Patient suffers from a severe right ankle fracture which impairs their ability to perform daily activities like bathing, dressing, feeding, grooming and toileting in the home. A cane, crutch or walker will not resolve the issue with performing activities of daily living. A wheelchair will allow patient to safely perform daily activities. Length of need would likely be approximately 3-6 months.   Dorothyann Drivers, MD 12/05/23 1141    Dorothyann Drivers, MD 12/05/23 [phone]

## 2023-12-05 NOTE — ED Triage Notes (Signed)
 BIBEMS from home. Around 1am pt got up to use the bathroom, had a syncopal episode, and fell on ankle. Pt denies hitting head and blood thinners. Obvious deformity to R ankle, pulses intact. Pt originally complaining of 10/10 pain. EMS gave 150 mcg of fentanyl , per EMS when they administered the fentanyl  pt became clammy and BP went down to 88/59, pt returned to baseline a few minutes later. Pt also received 4mg  of Zofran  and 400mL of NS in route. 98/62, otherwise VSS. 139 CBG. Hx of hydrodistention w/ nerve block on 10/24. Hx of SVT and chronic diarrhea. Pt complains that sternum hurts when laying down, pt states they think it is related to pt's hx of fibromyalgia.

## 2023-12-05 NOTE — Discharge Instructions (Addendum)
 Please ensure you have a follow-up appointment set up with podiatry by calling the number provided.  Please do not place any weight on your ankle.  Please use your wheelchair once you obtain this.  Please take your prescribed pain medication as needed, as written.  Do not take your chronic hydrocodone  while taking your prescribed Percocet.  Take 1 or the other.  Do not drink alcohol or drive while taking pain medication.

## 2023-12-07 ENCOUNTER — Other Ambulatory Visit: Payer: Self-pay | Admitting: Internal Medicine

## 2023-12-07 ENCOUNTER — Encounter: Payer: Self-pay | Admitting: Podiatry

## 2023-12-07 ENCOUNTER — Ambulatory Visit (INDEPENDENT_AMBULATORY_CARE_PROVIDER_SITE_OTHER): Admitting: Podiatry

## 2023-12-07 VITALS — Ht 65.0 in | Wt 189.0 lb

## 2023-12-07 DIAGNOSIS — S82851A Displaced trimalleolar fracture of right lower leg, initial encounter for closed fracture: Secondary | ICD-10-CM

## 2023-12-10 ENCOUNTER — Telehealth: Payer: Self-pay | Admitting: Podiatry

## 2023-12-10 ENCOUNTER — Telehealth: Payer: Self-pay | Admitting: Lab

## 2023-12-10 MED ORDER — OXYCODONE-ACETAMINOPHEN 5-325 MG PO TABS: 1.0000 | ORAL_TABLET | ORAL | 0 refills | Status: DC | PRN

## 2023-12-10 NOTE — Telephone Encounter (Signed)
Orders faxed to [facility].

## 2023-12-10 NOTE — Telephone Encounter (Signed)
 Patient has called again for pain medication refill to last her until surgery on Thursday. She is also asking for prescriptions to be faxed to [facility] ([place])for her DME (Knee scooter and walker?) [facility] fax [phone]

## 2023-12-10 NOTE — Telephone Encounter (Signed)
 Called and confirmed 11/13 surgery date. Patient not on any GLP1 medications or blood thinners. Patient preferred pharmacy is correct and set in chart. Patient aware [facility] will contact with arrival time.

## 2023-12-10 NOTE — Telephone Encounter (Signed)
 Patient states is out of pain medication and is requesting for you to send in.

## 2023-12-10 NOTE — Progress Notes (Signed)
 Chief Complaint  Patient presents with   Ankle Injury    Pt is here due to right ankle injury this happen on 11/5, states she fell and her ankle crumbled, was seen in the ER for this issue, x-rays were done, pt is currently in a splint, states she is in a lot of pain.    HPI: 53 y.o. female presenting today outpatient follow-up after sustaining a right ankle fracture on 12/05/2023.  Seen at [facility] ED.  Presenting today nonweightbearing in a posterior splint  Past Medical History:  Diagnosis Date   Anxiety 12/14/2011   Asthma    no inhaler   Cataract    Diastolic dysfunction    a. 10/2015 Echo: EF nl, mod diast dysfxn. Mild MR/TR; b. 09/2016 Echo: EF 55-60%, no rwma. Nl RV size and fxn; c. 03/2020 Echo: EF 60-65%. No rwma.   Factor 5 Leiden mutation, heterozygous    Fibromyalgia    Fracture of 5th metatarsal 03/2011   left foot -     Heart palpitations    a. Notes intermittent tachycardia (sinus). Did not tolerate beta blockers 2/2 urinary incontinence; b. 07/2017 Event monitor: Avg HR 77 (49-136). No significant ectopy/pauses. Triggered events corresponded w/ sinus rhythm, sinus arrhythmias, and artifact.   Interstitial cystitis    Non-cardiac chest pain    a. 12/2015 CTA chest: no PE. No significant coronary Ca2+. Mosaic attenuation pattern in both lungs, may reflect small airway dzs; c. 12/2015 Ex MV: Walked 6 mins. No ischemia/scar; d. 12/2019 Cor CTA: Ca2+ = 0. Nl cors. ? PFO w/o evidence of flow (not seen on echo).   Ovarian cyst    Pancreatitis    Pelvic floor dysfunction    Rheumatoid arteritis (HCC)    Sinus infection    chronic   Syncope    a. 08/2017 Event monitor: Average heart rate 77 (49-136).  Periods of sinus arrhythmia noted.  Triggered events corresponded with sinus rhythm, sinus arrhythmia, and artifact.  No significant ectopy, sustained arrhythmias, or prolonged pauses observed.   Thrombosis    a. 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    Past Surgical History:  Procedure Laterality Date   ABDOMINAL HYSTERECTOMY     CHOLECYSTECTOMY     CYSTOSCOPY WITH HYDRODISTENSION AND BIOPSY  04/21/2011   Dr Lamar Birmingham   LAPAROSCOPIC ASSISTED VAGINAL HYSTERECTOMY  08/21/2011   Procedure: LAPAROSCOPIC ASSISTED VAGINAL HYSTERECTOMY;  Surgeon: Rosaline LITTIE Cobble, MD;  Location: [facility];  Service: Gynecology;  Laterality: N/A;  OPEN LAPAROSCOPIC   LAPAROSCOPIC NISSEN FUNDOPLICATION  2007   neck ablation Left 12/29/2019   neck ablation Right 01/05/2020   SALPINGOOPHORECTOMY  08/21/2011   Procedure: SALPINGO OOPHERECTOMY;  Surgeon: Rosaline LITTIE Cobble, MD;  Location: [facility];  Service: Gynecology;  Laterality: Bilateral;   TUBAL LIGATION  2010   WISDOM TOOTH EXTRACTION      Allergies  Allergen Reactions   Amitriptyline Other (See Comments)    Other reaction(s): Other  Change in mental status   Amoxicillin Rash   Bromelains     Other reaction(s): Abdominal pain   Celexa [Citalopram Hydrobromide] Other (See Comments)    Other reaction(s): Other Change in mental status   Nortriptyline     Other reaction(s): Other   Penicillins Swelling and Rash   Pregabalin     Other reaction(s): Myalgias (muscle pain)   Bromelains [Pineapple Extract] Other (See Comments)  Severe pain   Budesonide-Formoterol Fumarate     Other reaction(s): Other   Citalopram Other (See Comments)   Mangifera Indica Swelling   Methylene Blue Other (See Comments)    Caused inflamed pancreas, per pt should not use   Sertraline Other (See Comments)    Other reaction(s): Other Suicidal thoughts   Sulfa Antibiotics Other (See Comments)   Sulfonamide Derivatives Other (See Comments)    excrutiating pain, patient states it caused her to have osteoarthritis   Xarelto  [Rivaroxaban ]    Bromaline Vallery Di Bromm] Rash   Carbamazepine, Eslicarbazepine, And Oxcarbazepine Anxiety   Milnacipran Anxiety   Other  Rash    Mango     Physical Exam: General: The patient is alert and oriented x3 in no acute distress.  Dermatology: Due to instability of the ankle posterior splint is left intact today.  Is very comfortable according the patient is not creating any irritations or discomfort  Vascular: Capillary refill WNL.  Neurological: Grossly intact via light touch  Musculoskeletal Exam: Trimalleolar ankle fracture right  DG Ankle Complete Right (Accession [id number]) (Order [id number]) Imaging Date: 12/05/2023 Department: [facility] Emergency Department at [facility] Released By/Authorizing: Dorothyann Drivers, MD (auto-released)  IMPRESSION: Trimalleolar fracture with lateral dislocation of the ankle, as described above.  Assessment/Plan of Care: 1.  Trimalleolar ankle fracture right; closed, initial encounter  -Patient evaluated.  X-rays reviewed -Today we discussed surgical ORIF of the right ankle.  Medically necessary to better alignment and reduce the fractures and allow optimal healing.  Risk benefits advantages and disadvantages the procedure were explained in length in detail to the patient.  No guarantees were expressed or implied.  All patient questions were answered.  Postoperative recovery course was also explained in length in detail.  Patient consents and would like to proceed with surgery -Authorization for surgery was initiated today.  Surgery will consist of open reduction with internal fixation right ankle fracture -Return to clinic 1 week postop     Thresa EMERSON Sar, DPM [facility]  Dr. Thresa EMERSON Sar, DPM    [address] [place], [place] [address]                Office [phone]  Fax [phone]

## 2023-12-10 NOTE — Telephone Encounter (Signed)
 DOS- 12/13/2023  OPEN TREATMENT OF TRIMALLEOLAR ANKLE FRACTURE RT- 27822  [facility] EFFECTIVE DATE- 01/31/2023  DEDUCTIBLE- $257 REMAINING- $0 OOP- $9350 REMAINING- $3011.96 COINSURANCE- 20%  PER [employer] PORTAL, PRIOR AUTH IS NOT REQUIRED FOR CPT 27822. DECISION ID# [id number]

## 2023-12-13 ENCOUNTER — Other Ambulatory Visit: Payer: Self-pay | Admitting: Podiatry

## 2023-12-13 DIAGNOSIS — S82851A Displaced trimalleolar fracture of right lower leg, initial encounter for closed fracture: Secondary | ICD-10-CM | POA: Diagnosis not present

## 2023-12-13 MED ORDER — IBUPROFEN 800 MG PO TABS: 800.0000 mg | ORAL_TABLET | Freq: Three times a day (TID) | ORAL | 1 refills | Status: AC

## 2023-12-13 MED ORDER — OXYCODONE-ACETAMINOPHEN 5-325 MG PO TABS: 1.0000 | ORAL_TABLET | ORAL | 0 refills | Status: DC | PRN

## 2023-12-13 NOTE — Progress Notes (Signed)
 PRN postop

## 2023-12-14 ENCOUNTER — Telehealth: Payer: Self-pay | Admitting: Podiatry

## 2023-12-14 NOTE — Telephone Encounter (Signed)
 Received call from patient about postop pain and, is having significant pain in foot and heel advised her to elevate without pressure on the heel and she said she thinks this may be the issue she will call back if she has further issues

## 2023-12-18 ENCOUNTER — Encounter (INDEPENDENT_AMBULATORY_CARE_PROVIDER_SITE_OTHER): Admitting: Ophthalmology

## 2023-12-18 DIAGNOSIS — H5213 Myopia, bilateral: Secondary | ICD-10-CM

## 2023-12-18 DIAGNOSIS — H25813 Combined forms of age-related cataract, bilateral: Secondary | ICD-10-CM

## 2023-12-18 DIAGNOSIS — H3552 Pigmentary retinal dystrophy: Secondary | ICD-10-CM

## 2023-12-19 ENCOUNTER — Telehealth: Payer: Self-pay | Admitting: Podiatry

## 2023-12-19 NOTE — Telephone Encounter (Signed)
 Patient lvm today, stating boot is getting loose, seems like more irritation.

## 2023-12-21 ENCOUNTER — Ambulatory Visit (INDEPENDENT_AMBULATORY_CARE_PROVIDER_SITE_OTHER)

## 2023-12-21 ENCOUNTER — Ambulatory Visit: Admitting: Podiatry

## 2023-12-21 ENCOUNTER — Encounter: Payer: Self-pay | Admitting: Podiatry

## 2023-12-21 VITALS — Ht 65.0 in | Wt 189.0 lb

## 2023-12-21 DIAGNOSIS — S82851A Displaced trimalleolar fracture of right lower leg, initial encounter for closed fracture: Secondary | ICD-10-CM | POA: Diagnosis not present

## 2023-12-21 MED ORDER — OXYCODONE-ACETAMINOPHEN 5-325 MG PO TABS: 1.0000 | ORAL_TABLET | Freq: Four times a day (QID) | ORAL | 0 refills | Status: DC | PRN

## 2023-12-21 NOTE — Progress Notes (Signed)
 Chief Complaint  Patient presents with   Routine Post Op    POV #1 DOS 12/13/2023 RT OPEN TREATMENT OF A TRIMALLEOLAR ANKLE FRACTURE THAT INCLUDES INTERNAL FIXATION OF THE MEDIAL AND/OR LATERAL MALLEOLI, pt states everything is going well, still has some pain, continues to wear boot, has not put any pressure onto the ankle.    Subjective:  Patient presents today status post ORIF RT trimalleolar ankle fracture.  DOS: 12/13/2023.  Doing well.  Nonweightbearing as instructed.  No new complaints  Past Medical History:  Diagnosis Date   Anxiety 12/14/2011   Asthma    no inhaler   Cataract    Diastolic dysfunction    a. 10/2015 Echo: EF nl, mod diast dysfxn. Mild MR/TR; b. 09/2016 Echo: EF 55-60%, no rwma. Nl RV size and fxn; c. 03/2020 Echo: EF 60-65%. No rwma.   Factor 5 Leiden mutation, heterozygous    Fibromyalgia    Fracture of 5th metatarsal 03/2011   left foot -     Heart palpitations    a. Notes intermittent tachycardia (sinus). Did not tolerate beta blockers 2/2 urinary incontinence; b. 07/2017 Event monitor: Avg HR 77 (49-136). No significant ectopy/pauses. Triggered events corresponded w/ sinus rhythm, sinus arrhythmias, and artifact.   Interstitial cystitis    Non-cardiac chest pain    a. 12/2015 CTA chest: no PE. No significant coronary Ca2+. Mosaic attenuation pattern in both lungs, may reflect small airway dzs; c. 12/2015 Ex MV: Walked 6 mins. No ischemia/scar; d. 12/2019 Cor CTA: Ca2+ = 0. Nl cors. ? PFO w/o evidence of flow (not seen on echo).   Ovarian cyst    Pancreatitis    Pelvic floor dysfunction    Rheumatoid arteritis (HCC)    Sinus infection    chronic   Syncope    a. 08/2017 Event monitor: Average heart rate 77 (49-136).  Periods of sinus arrhythmia noted.  Triggered events corresponded with sinus rhythm, sinus arrhythmia, and artifact.  No significant ectopy, sustained arrhythmias, or prolonged pauses observed.   Thrombosis    a. 12/14/2011 Thrombosis of R  gonadal vein with extension of thrombus into IVC to level of R renal vein per CT 12/11/12 of [facility].    Past Surgical History:  Procedure Laterality Date   ABDOMINAL HYSTERECTOMY     CHOLECYSTECTOMY     CYSTOSCOPY WITH HYDRODISTENSION AND BIOPSY  04/21/2011   Dr Lamar Birmingham   LAPAROSCOPIC ASSISTED VAGINAL HYSTERECTOMY  08/21/2011   Procedure: LAPAROSCOPIC ASSISTED VAGINAL HYSTERECTOMY;  Surgeon: Rosaline LITTIE Cobble, MD;  Location: [facility];  Service: Gynecology;  Laterality: N/A;  OPEN LAPAROSCOPIC   LAPAROSCOPIC NISSEN FUNDOPLICATION  2007   neck ablation Left 12/29/2019   neck ablation Right 01/05/2020   SALPINGOOPHORECTOMY  08/21/2011   Procedure: SALPINGO OOPHERECTOMY;  Surgeon: Rosaline LITTIE Cobble, MD;  Location: [facility];  Service: Gynecology;  Laterality: Bilateral;   TUBAL LIGATION  2010   WISDOM TOOTH EXTRACTION      Allergies  Allergen Reactions   Amitriptyline Other (See Comments)    Other reaction(s): Other  Change in mental status   Amoxicillin Rash   Bromelains     Other reaction(s): Abdominal pain   Celexa [Citalopram Hydrobromide] Other (See Comments)    Other reaction(s): Other Change in mental status   Nortriptyline     Other reaction(s): Other   Penicillins Swelling and Rash   Pregabalin     Other reaction(s): Myalgias (muscle pain)   Bromelains [Pineapple Extract] Other (See Comments)  Severe pain   Budesonide-Formoterol Fumarate     Other reaction(s): Other   Citalopram Other (See Comments)   Mangifera Indica Swelling   Methylene Blue Other (See Comments)    Caused inflamed pancreas, per pt should not use   Sertraline Other (See Comments)    Other reaction(s): Other Suicidal thoughts   Sulfa Antibiotics Other (See Comments)   Sulfonamide Derivatives Other (See Comments)    excrutiating pain, patient states it caused her to have osteoarthritis   Xarelto  [Rivaroxaban ]    Bromaline Vallery Di Bromm] Rash   Carbamazepine, Eslicarbazepine, And  Oxcarbazepine Anxiety   Milnacipran Anxiety   Other Rash    Mango    Objective/Physical Exam Neurovascular status intact.  Incision well coapted with sutures intact. No sign of infectious process noted. No dehiscence. No active bleeding noted.  Moderate edema noted to the surgical extremity.  Radiographic Exam RT ankle 12/21/2023:  Restoration of the tibiotalar joint.  Joint is congruent.  Orthopedic hardware intact.  Assessment: 1. s/p ORIF RT trimalleolar ankle fracture. DOS: 12/13/2023   Plan of Care:  -Patient was evaluated. X-rays reviewed - Dressings changed.  Leave clean dry and intact -Continue NWB in the boot -Continue Percocet 5/325 mg every 6 hours as needed pain.  Refill provided -Return to clinic 2 weeks staple removal   Thresa EMERSON Sar, DPM [facility]  Dr. Thresa EMERSON Sar, DPM    [address] [place], [place] [address]                Office [phone]  Fax [phone]

## 2023-12-22 ENCOUNTER — Encounter: Payer: Self-pay | Admitting: Podiatry

## 2023-12-22 ENCOUNTER — Other Ambulatory Visit: Payer: Self-pay | Admitting: Internal Medicine

## 2023-12-22 DIAGNOSIS — K588 Other irritable bowel syndrome: Secondary | ICD-10-CM

## 2023-12-24 ENCOUNTER — Encounter: Payer: Self-pay | Admitting: Podiatry

## 2023-12-24 ENCOUNTER — Telehealth: Payer: Self-pay

## 2023-12-24 NOTE — Telephone Encounter (Signed)
 Patient called stating that she slid out of wheelchair this morning. She says she landed on her bottom and he foot was perpendicular to the floor. She states she doesn't think she hurt herself. She wants to know if it's okay for her to take the boot off to look at the site and if it is okay to clean around the site. She said her husband told her she is not to take the boot off until she sees you again. Please advise.

## 2023-12-31 ENCOUNTER — Other Ambulatory Visit: Payer: Self-pay | Admitting: Podiatry

## 2024-01-04 ENCOUNTER — Ambulatory Visit (INDEPENDENT_AMBULATORY_CARE_PROVIDER_SITE_OTHER): Admitting: Podiatry

## 2024-01-04 ENCOUNTER — Encounter: Payer: Self-pay | Admitting: Podiatry

## 2024-01-04 VITALS — Ht 65.0 in | Wt 189.0 lb

## 2024-01-04 DIAGNOSIS — S82851A Displaced trimalleolar fracture of right lower leg, initial encounter for closed fracture: Secondary | ICD-10-CM

## 2024-01-06 NOTE — Progress Notes (Signed)
 Chief Complaint  Patient presents with   Routine Post Op    POV #2 DOS 12/13/2023 RT OPEN TREATMENT OF A TRIMALLEOLAR ANKLE FRACTURE THAT INCLUDES INTERNAL FIXATION OF THE MEDIAL AND/OR LATERAL MALLEOLI, she states everything is going well, minimal pain, continues to wear boot and using wheelchair to ambulate.     Subjective:  Patient presents today status post ORIF RT trimalleolar ankle fracture.  DOS: 12/13/2023.  Doing well.  Nonweightbearing as instructed.  No new complaints  Past Medical History:  Diagnosis Date   Anxiety 12/14/2011   Asthma    no inhaler   Cataract    Diastolic dysfunction    a. 10/2015 Echo: EF nl, mod diast dysfxn. Mild MR/TR; b. 09/2016 Echo: EF 55-60%, no rwma. Nl RV size and fxn; c. 03/2020 Echo: EF 60-65%. No rwma.   Factor 5 Leiden mutation, heterozygous    Fibromyalgia    Fracture of 5th metatarsal 03/2011   left foot -     Heart palpitations    a. Notes intermittent tachycardia (sinus). Did not tolerate beta blockers 2/2 urinary incontinence; b. 07/2017 Event monitor: Avg HR 77 (49-136). No significant ectopy/pauses. Triggered events corresponded w/ sinus rhythm, sinus arrhythmias, and artifact.   Interstitial cystitis    Non-cardiac chest pain    a. 12/2015 CTA chest: no PE. No significant coronary Ca2+. Mosaic attenuation pattern in both lungs, may reflect small airway dzs; c. 12/2015 Ex MV: Walked 6 mins. No ischemia/scar; d. 12/2019 Cor CTA: Ca2+ = 0. Nl cors. ? PFO w/o evidence of flow (not seen on echo).   Ovarian cyst    Pancreatitis    Pelvic floor dysfunction    Rheumatoid arteritis (HCC)    Sinus infection    chronic   Syncope    a. 08/2017 Event monitor: Average heart rate 77 (49-136).  Periods of sinus arrhythmia noted.  Triggered events corresponded with sinus rhythm, sinus arrhythmia, and artifact.  No significant ectopy, sustained arrhythmias, or prolonged pauses observed.   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    Past Surgical History:  Procedure Laterality Date   ABDOMINAL HYSTERECTOMY     CHOLECYSTECTOMY     CYSTOSCOPY WITH HYDRODISTENSION AND BIOPSY  04/21/2011   Dr Lamar Birmingham   LAPAROSCOPIC ASSISTED VAGINAL HYSTERECTOMY  08/21/2011   Procedure: LAPAROSCOPIC ASSISTED VAGINAL HYSTERECTOMY;  Surgeon: Rosaline LITTIE Cobble, MD;  Location: [facility];  Service: Gynecology;  Laterality: N/A;  OPEN LAPAROSCOPIC   LAPAROSCOPIC NISSEN FUNDOPLICATION  2007   neck ablation Left 12/29/2019   neck ablation Right 01/05/2020   SALPINGOOPHORECTOMY  08/21/2011   Procedure: SALPINGO OOPHERECTOMY;  Surgeon: Rosaline LITTIE Cobble, MD;  Location: [facility];  Service: Gynecology;  Laterality: Bilateral;   TUBAL LIGATION  2010   WISDOM TOOTH EXTRACTION      Allergies  Allergen Reactions   Amitriptyline Other (See Comments)    Other reaction(s): Other  Change in mental status   Amoxicillin Rash   Bromelains     Other reaction(s): Abdominal pain   Celexa [Citalopram Hydrobromide] Other (See Comments)    Other reaction(s): Other Change in mental status   Nortriptyline     Other reaction(s): Other   Penicillins Swelling and Rash   Pregabalin     Other reaction(s): Myalgias (muscle pain)   Bromelains [Pineapple Extract] Other (See Comments)    Severe pain  Budesonide-Formoterol Fumarate     Other reaction(s): Other   Citalopram Other (See Comments)   Mangifera Indica Swelling   Methylene Blue Other (See Comments)    Caused inflamed pancreas, per pt should not use   Sertraline Other (See Comments)    Other reaction(s): Other Suicidal thoughts   Sulfa Antibiotics Other (See Comments)   Sulfonamide Derivatives Other (See Comments)    excrutiating pain, patient states it caused her to have osteoarthritis   Xarelto  [Rivaroxaban ]    Bromaline Vallery Di Bromm] Rash   Carbamazepine, Eslicarbazepine, And Oxcarbazepine Anxiety    Milnacipran Anxiety   Other Rash    Mango    Objective/Physical Exam Neurovascular status intact.  Incision well coapted with sutures intact. No sign of infectious process noted. No dehiscence. No active bleeding noted.  Moderate edema noted to the surgical extremity.  Radiographic Exam RT ankle 12/21/2023:  Restoration of the tibiotalar joint.  Joint is congruent.  Orthopedic hardware intact.  Assessment: 1. s/p ORIF RT trimalleolar ankle fracture. DOS: 12/13/2023   Plan of Care:  -Patient was evaluated.  -Staples removed -Continue NWB CAM boot -Return to clinic 4 weeks follow-up x-ray and to initiate weightbearing and possible physical therapy   Thresa EMERSON Sar, DPM [facility]  Dr. Thresa EMERSON Sar, DPM    [address] [place], [place] [address]                Office [phone]  Fax [phone]

## 2024-01-07 ENCOUNTER — Other Ambulatory Visit: Payer: Self-pay | Admitting: Internal Medicine

## 2024-01-07 DIAGNOSIS — R110 Nausea: Secondary | ICD-10-CM

## 2024-01-09 ENCOUNTER — Other Ambulatory Visit: Payer: Self-pay | Admitting: Podiatry

## 2024-01-10 ENCOUNTER — Telehealth: Payer: Self-pay

## 2024-01-10 NOTE — Telephone Encounter (Signed)
 Patient called requesting refill of Oxycodone  5/325mg 

## 2024-01-11 ENCOUNTER — Other Ambulatory Visit: Payer: Self-pay | Admitting: Podiatry

## 2024-01-18 ENCOUNTER — Ambulatory Visit

## 2024-01-18 ENCOUNTER — Ambulatory Visit: Admitting: Podiatry

## 2024-01-18 VITALS — Ht 65.0 in | Wt 189.0 lb

## 2024-01-18 DIAGNOSIS — S82851A Displaced trimalleolar fracture of right lower leg, initial encounter for closed fracture: Secondary | ICD-10-CM | POA: Diagnosis not present

## 2024-01-18 NOTE — Progress Notes (Signed)
 "  Chief Complaint  Patient presents with   Routine Post Op    POV #3 DOS 12/13/2023 RT OPEN TREATMENT OF A TRIMALLEOLAR ANKLE FRACTURE THAT INCLUDES INTERNAL FIXATION OF THE MEDIAL AND/OR LATERAL MALLEOLI,    Subjective:  Patient presents today status post ORIF RT trimalleolar ankle fracture.  DOS: 12/13/2023.  Doing well.  Nonweightbearing as instructed.  No new complaints  Past Medical History:  Diagnosis Date   Anxiety 12/14/2011   Asthma    no inhaler   Cataract    Diastolic dysfunction    a. 10/2015 Echo: EF nl, mod diast dysfxn. Mild MR/TR; b. 09/2016 Echo: EF 55-60%, no rwma. Nl RV size and fxn; c. 03/2020 Echo: EF 60-65%. No rwma.   Factor 5 Leiden mutation, heterozygous    Fibromyalgia    Fracture of 5th metatarsal 03/2011   left foot -     Heart palpitations    a. Notes intermittent tachycardia (sinus). Did not tolerate beta blockers 2/2 urinary incontinence; b. 07/2017 Event monitor: Avg HR 77 (49-136). No significant ectopy/pauses. Triggered events corresponded w/ sinus rhythm, sinus arrhythmias, and artifact.   Interstitial cystitis    Non-cardiac chest pain    a. 12/2015 CTA chest: no PE. No significant coronary Ca2+. Mosaic attenuation pattern in both lungs, may reflect small airway dzs; c. 12/2015 Ex MV: Walked 6 mins. No ischemia/scar; d. 12/2019 Cor CTA: Ca2+ = 0. Nl cors. ? PFO w/o evidence of flow (not seen on echo).   Ovarian cyst    Pancreatitis    Pelvic floor dysfunction    Rheumatoid arteritis (HCC)    Sinus infection    chronic   Syncope    a. 08/2017 Event monitor: Average heart rate 77 (49-136).  Periods of sinus arrhythmia noted.  Triggered events corresponded with sinus rhythm, sinus arrhythmia, and artifact.  No significant ectopy, sustained arrhythmias, or prolonged pauses observed.   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    Past Surgical  History:  Procedure Laterality Date   ABDOMINAL HYSTERECTOMY     CHOLECYSTECTOMY     CYSTOSCOPY WITH HYDRODISTENSION AND BIOPSY  04/21/2011   Dr Lamar Birmingham   LAPAROSCOPIC ASSISTED VAGINAL HYSTERECTOMY  08/21/2011   Procedure: LAPAROSCOPIC ASSISTED VAGINAL HYSTERECTOMY;  Surgeon: Rosaline LITTIE Cobble, MD;  Location: [facility];  Service: Gynecology;  Laterality: N/A;  OPEN LAPAROSCOPIC   LAPAROSCOPIC NISSEN FUNDOPLICATION  2007   neck ablation Left 12/29/2019   neck ablation Right 01/05/2020   SALPINGOOPHORECTOMY  08/21/2011   Procedure: SALPINGO OOPHERECTOMY;  Surgeon: Rosaline LITTIE Cobble, MD;  Location: [facility];  Service: Gynecology;  Laterality: Bilateral;   TUBAL LIGATION  2010   WISDOM TOOTH EXTRACTION      Allergies  Allergen Reactions   Amitriptyline Other (See Comments)    Other reaction(s): Other  Change in mental status   Amoxicillin Rash   Bromelains     Other reaction(s): Abdominal pain   Celexa [Citalopram Hydrobromide] Other (See Comments)    Other reaction(s): Other Change in mental status   Nortriptyline     Other reaction(s): Other   Penicillins Swelling and Rash   Pregabalin     Other reaction(s): Myalgias (muscle pain)   Bromelains [Pineapple Extract] Other (See Comments)    Severe pain   Budesonide-Formoterol Fumarate     Other reaction(s): Other   Citalopram Other (See Comments)  Mangifera Indica Swelling   Methylene Blue Other (See Comments)    Caused inflamed pancreas, per pt should not use   Sertraline Other (See Comments)    Other reaction(s): Other Suicidal thoughts   Sulfa Antibiotics Other (See Comments)   Sulfonamide Derivatives Other (See Comments)    excrutiating pain, patient states it caused her to have osteoarthritis   Xarelto  [Rivaroxaban ]    Bromaline Vallery Di Bromm] Rash   Carbamazepine, Eslicarbazepine, And Oxcarbazepine Anxiety   Milnacipran Anxiety   Other Rash    Mango    Objective/Physical Exam Neurovascular status intact.   Incision well coapted with sutures intact. No sign of infectious process noted. No dehiscence. No active bleeding noted.  Moderate edema noted to the surgical extremity.  Radiographic Exam RT ankle 01/18/2024:  Restoration of the tibiotalar joint.  Joint is congruent.  Orthopedic hardware intact.  Assessment: 1. s/p ORIF RT trimalleolar ankle fracture. DOS: 12/13/2023   Plan of Care:  -Patient was evaluated.  X-rays reviewed - Compression ankle sleeve dispensed -Continue NWB CAM boot -Anticipated last visit that the patient was not set to return for 4 weeks but apparently she had an appointment today. -Return to clinic 2 weeks follow-up x-ray to initiate weightbearing and physical therapy as needed   Thresa EMERSON Sar, DPM [facility]  Dr. Thresa EMERSON Sar, DPM    [address] [place], [place] [address]                Office [phone]  Fax [phone]      "

## 2024-01-22 ENCOUNTER — Other Ambulatory Visit: Payer: Self-pay | Admitting: Podiatry

## 2024-01-30 ENCOUNTER — Other Ambulatory Visit: Payer: Self-pay | Admitting: Internal Medicine

## 2024-02-05 ENCOUNTER — Encounter: Admitting: Podiatry

## 2024-02-05 ENCOUNTER — Ambulatory Visit (INDEPENDENT_AMBULATORY_CARE_PROVIDER_SITE_OTHER)

## 2024-02-05 DIAGNOSIS — S82851A Displaced trimalleolar fracture of right lower leg, initial encounter for closed fracture: Secondary | ICD-10-CM

## 2024-02-05 NOTE — Progress Notes (Signed)
 "  Chief Complaint  Patient presents with   Routine Post Op     post op-POV #3 DOS 12/13/2023 RT OPEN TREATMENT OF A TRIMALLEOLAR ANKLE FRACTURE THAT INCLUDES INTERNAL FIXATION OF THE MEDIAL AND/OR LATERAL MALLEO. She feel like things a healing well. She does mention some tingling sensation at the incision site     Subjective:  Patient presents today status post ORIF RT trimalleolar ankle fracture.  DOS: 12/13/2023.  Doing well.  Nonweightbearing as instructed.  No new complaints  Past Medical History:  Diagnosis Date   Anxiety 12/14/2011   Asthma    no inhaler   Cataract    Diastolic dysfunction    a. 10/2015 Echo: EF nl, mod diast dysfxn. Mild MR/TR; b. 09/2016 Echo: EF 55-60%, no rwma. Nl RV size and fxn; c. 03/2020 Echo: EF 60-65%. No rwma.   Factor 5 Leiden mutation, heterozygous    Fibromyalgia    Fracture of 5th metatarsal 03/2011   left foot -     Heart palpitations    a. Notes intermittent tachycardia (sinus). Did not tolerate beta blockers 2/2 urinary incontinence; b. 07/2017 Event monitor: Avg HR 77 (49-136). No significant ectopy/pauses. Triggered events corresponded w/ sinus rhythm, sinus arrhythmias, and artifact.   Interstitial cystitis    Non-cardiac chest pain    a. 12/2015 CTA chest: no PE. No significant coronary Ca2+. Mosaic attenuation pattern in both lungs, may reflect small airway dzs; c. 12/2015 Ex MV: Walked 6 mins. No ischemia/scar; d. 12/2019 Cor CTA: Ca2+ = 0. Nl cors. ? PFO w/o evidence of flow (not seen on echo).   Ovarian cyst    Pancreatitis    Pelvic floor dysfunction    Rheumatoid arteritis (HCC)    Sinus infection    chronic   Syncope    a. 08/2017 Event monitor: Average heart rate 77 (49-136).  Periods of sinus arrhythmia noted.  Triggered events corresponded with sinus rhythm, sinus arrhythmia, and artifact.  No significant ectopy, sustained arrhythmias, or prolonged pauses observed.   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    Past Surgical History:  Procedure Laterality Date   ABDOMINAL HYSTERECTOMY     CHOLECYSTECTOMY     CYSTOSCOPY WITH HYDRODISTENSION AND BIOPSY  04/21/2011   Dr Lamar Birmingham   LAPAROSCOPIC ASSISTED VAGINAL HYSTERECTOMY  08/21/2011   Procedure: LAPAROSCOPIC ASSISTED VAGINAL HYSTERECTOMY;  Surgeon: Rosaline LITTIE Cobble, MD;  Location: [facility];  Service: Gynecology;  Laterality: N/A;  OPEN LAPAROSCOPIC   LAPAROSCOPIC NISSEN FUNDOPLICATION  2007   neck ablation Left 12/29/2019   neck ablation Right 01/05/2020   SALPINGOOPHORECTOMY  08/21/2011   Procedure: SALPINGO OOPHERECTOMY;  Surgeon: Rosaline LITTIE Cobble, MD;  Location: [facility];  Service: Gynecology;  Laterality: Bilateral;   TUBAL LIGATION  2010   WISDOM TOOTH EXTRACTION      Allergies  Allergen Reactions   Amitriptyline Other (See Comments)    Other reaction(s): Other  Change in mental status   Amoxicillin Rash   Bromelains     Other reaction(s): Abdominal pain   Celexa [Citalopram Hydrobromide] Other (See Comments)    Other reaction(s): Other Change in mental status   Nortriptyline     Other reaction(s): Other   Penicillins Swelling and Rash   Pregabalin     Other reaction(s): Myalgias (muscle pain)   Bromelains [Pineapple Extract] Other (See Comments)  Severe pain   Budesonide-Formoterol Fumarate     Other reaction(s): Other   Citalopram Other (See Comments)   Mangifera Indica Swelling   Methylene Blue Other (See Comments)    Caused inflamed pancreas, per pt should not use   Sertraline Other (See Comments)    Other reaction(s): Other Suicidal thoughts   Sulfa Antibiotics Other (See Comments)   Sulfonamide Derivatives Other (See Comments)    excrutiating pain, patient states it caused her to have osteoarthritis   Xarelto  [Rivaroxaban ]    Bromaline Vallery Di Bromm] Rash   Carbamazepine, Eslicarbazepine, And Oxcarbazepine Anxiety    Milnacipran Anxiety   Other Rash    Mango    Objective/Physical Exam Neurovascular status intact.  Incisions are nicely healed.  Minimal edema noted.  Good range of motion to the ankle joint without any evidence of crepitus or pain with palpation or range of motion  Radiographic Exam RT ankle 01/18/2024:  Restoration of the tibiotalar joint.  Joint is congruent.  Orthopedic hardware intact.  Assessment: 1. s/p ORIF RT trimalleolar ankle fracture. DOS: 12/13/2023   Plan of Care:  -Patient was evaluated.  X-rays reviewed - FWB cam boot -Order placed for physical therapy at [facility] -Beginning February 2026 she may begin to transition out of the cam boot to good supportive tennis shoes and sneakers -Return to clinic 6 weeks follow-up x-ray  Thresa EMERSON Sar, DPM [facility]  Dr. Thresa EMERSON Sar, DPM    [address] [place], [place] [address]                Office [phone]  Fax [phone]      "

## 2024-02-12 ENCOUNTER — Ambulatory Visit: Admitting: Internal Medicine

## 2024-02-12 VITALS — BP 112/72 | HR 94 | Ht 65.0 in

## 2024-02-12 DIAGNOSIS — I479 Paroxysmal tachycardia, unspecified: Secondary | ICD-10-CM | POA: Diagnosis not present

## 2024-02-12 DIAGNOSIS — Z0130 Encounter for examination of blood pressure without abnormal findings: Secondary | ICD-10-CM

## 2024-02-12 DIAGNOSIS — R7303 Prediabetes: Secondary | ICD-10-CM

## 2024-02-12 DIAGNOSIS — S82891D Other fracture of right lower leg, subsequent encounter for closed fracture with routine healing: Secondary | ICD-10-CM | POA: Diagnosis not present

## 2024-02-12 DIAGNOSIS — M797 Fibromyalgia: Secondary | ICD-10-CM | POA: Diagnosis not present

## 2024-02-12 NOTE — Progress Notes (Signed)
 "  Established Patient Office Visit  Subjective:  Patient ID: April [name], female    DOB: [birth year]  Age: 54 y.o. MRN: [id number]  Chief Complaint  Patient presents with   Follow-up    3 month lab results    Sustained trimalleolar fracture of her right ankle and underwent ORIF in Nov. Now partially weight bearing with a boot. Also here for lab review and medication refills.     No other concerns at this time.   Past Medical History:  Diagnosis Date   Anxiety 12/14/2011   Asthma    no inhaler   Cataract    Diastolic dysfunction    a. 10/2015 Echo: EF nl, mod diast dysfxn. Mild MR/TR; b. 09/2016 Echo: EF 55-60%, no rwma. Nl RV size and fxn; c. 03/2020 Echo: EF 60-65%. No rwma.   Factor 5 Leiden mutation, heterozygous    Fibromyalgia    Fracture of 5th metatarsal 03/2011   left foot -     Heart palpitations    a. Notes intermittent tachycardia (sinus). Did not tolerate beta blockers 2/2 urinary incontinence; b. 07/2017 Event monitor: Avg HR 77 (49-136). No significant ectopy/pauses. Triggered events corresponded w/ sinus rhythm, sinus arrhythmias, and artifact.   Interstitial cystitis    Non-cardiac chest pain    a. 12/2015 CTA chest: no PE. No significant coronary Ca2+. Mosaic attenuation pattern in both lungs, may reflect small airway dzs; c. 12/2015 Ex MV: Walked 6 mins. No ischemia/scar; d. 12/2019 Cor CTA: Ca2+ = 0. Nl cors. ? PFO w/o evidence of flow (not seen on echo).   Ovarian cyst    Pancreatitis    Pelvic floor dysfunction    Rheumatoid arteritis (HCC)    Sinus infection    chronic   Syncope    a. 08/2017 Event monitor: Average heart rate 77 (49-136).  Periods of sinus arrhythmia noted.  Triggered events corresponded with sinus rhythm, sinus arrhythmia, and artifact.  No significant ectopy, sustained arrhythmias, or prolonged pauses observed.   Thrombosis    a. 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    Past Surgical History:  Procedure Laterality Date   ABDOMINAL HYSTERECTOMY     CHOLECYSTECTOMY     CYSTOSCOPY WITH HYDRODISTENSION AND BIOPSY  04/21/2011   Dr Lamar Birmingham   LAPAROSCOPIC ASSISTED VAGINAL HYSTERECTOMY  08/21/2011   Procedure: LAPAROSCOPIC ASSISTED VAGINAL HYSTERECTOMY;  Surgeon: Rosaline LITTIE Cobble, MD;  Location: [facility];  Service: Gynecology;  Laterality: N/A;  OPEN LAPAROSCOPIC   LAPAROSCOPIC NISSEN FUNDOPLICATION  2007   neck ablation Left 12/29/2019   neck ablation Right 01/05/2020   SALPINGOOPHORECTOMY  08/21/2011   Procedure: SALPINGO OOPHERECTOMY;  Surgeon: Rosaline LITTIE Cobble, MD;  Location: [facility];  Service: Gynecology;  Laterality: Bilateral;   TUBAL LIGATION  2010   WISDOM TOOTH EXTRACTION      Social History   Socioeconomic History   Marital status: Married    Spouse name: Not on file   Number of children: Not on file   Years of education: Not on file   Highest education level: Not on file  Occupational History   Not on file  Tobacco Use   Smoking status: Never   Smokeless tobacco: Never  Vaping Use   Vaping status: Never Used  Substance and Sexual Activity   Alcohol use: No   Drug use: No   Sexual activity: Yes  Birth control/protection: Surgical  Other Topics Concern   Not on file  Social History Narrative   Not on file   Social Drivers of Health   Tobacco Use: Low Risk (01/18/2024)   Patient History    Smoking Tobacco Use: Never    Smokeless Tobacco Use: Never    Passive Exposure: Not on file  Financial Resource Strain: Not on file  Food Insecurity: Low Risk (04/19/2023)   Received from [facility]   Epic    Within the past 12 months, you worried that your food would run out before you got money to buy more: Never true    Within the past 12 months, the food you bought just didn't last and you didn't have money to get more. : Never true  Transportation Needs: No Transportation Needs (04/19/2023)   Received from  [facility]    In the past 12 months, has lack of reliable transportation kept you from medical appointments, meetings, work or from getting things needed for daily living? : No  Physical Activity: Not on file  Stress: Not on file  Social Connections: Not on file  Intimate Partner Violence: Not on file  Depression (PHQ2-9): Low Risk (07/13/2023)   Depression (PHQ2-9)    PHQ-2 Score: 3  Alcohol Screen: Not on file  Housing: Low Risk (04/19/2023)   Received from [facility]   Epic    What is your living situation today?: I have a steady place to live    Think about the place you live. Do you have problems with any of the following? Choose all that apply:: None/None on this list  Utilities: Low Risk (04/19/2023)   Received from [facility]   Utilities    In the past 12 months has the electric, gas, oil, or water company threatened to shut off services in your home? : No  Health Literacy: Not on file    Family History  Problem Relation Age of Onset   Heart Problems Mother    Vascular Disease Mother    Skin cancer Mother    Vascular Disease Maternal Grandmother    Kidney Stones Father    Skin cancer Maternal Uncle    Anesthesia problems Neg Hx     Allergies[1]  Show/hide medication list[2]  Review of Systems  Musculoskeletal:  Positive for joint pain.       Objective:   BP 112/72   Pulse 94   Ht 5' 5 (1.651 m)   LMP 05/20/2011   SpO2 97%   BMI 31.45 kg/m   Vitals:   02/12/24 1411  BP: 112/72  Pulse: 94  Height: 5' 5 (1.651 m)  SpO2: 97%    Physical Exam   No results found for any visits on 02/12/24.  Recent Results (from the past 2160 hours)  CK     Status: None   Collection Time: 12/05/23  8:22 AM  Result Value Ref Range   Total CK 46 38 - 234 U/L    Comment: Performed at [facility], [address] [place], [place] [address]  Comprehensive metabolic panel with GFR     Status: Abnormal   Collection Time: 12/05/23   8:22 AM  Result Value Ref Range   Sodium 136 135 - 145 mmol/L   Potassium 4.4 3.5 - 5.1 mmol/L   Chloride 104 98 - 111 mmol/L   CO2 21 (L) 22 - 32 mmol/L   Glucose, Bld 121 (H) 70 - 99 mg/dL  Comment: Glucose reference range applies only to samples taken after fasting for at least 8 hours.   BUN 9 6 - 20 mg/dL   Creatinine, Ser 9.45 0.44 - 1.00 mg/dL   Calcium 8.2 (L) 8.9 - 10.3 mg/dL   Total Protein 6.6 6.5 - 8.1 g/dL   Albumin 3.9 3.5 - 5.0 g/dL   AST 810 (H) 15 - 41 U/L   ALT 106 (H) 0 - 44 U/L   Alkaline Phosphatase 37 (L) 38 - 126 U/L   Total Bilirubin 0.7 0.0 - 1.2 mg/dL   GFR, Estimated >39 >39 mL/min    Comment: (NOTE) Calculated using the CKD-EPI Creatinine Equation (2021)    Anion gap 11 5 - 15    Comment: Performed at [facility], [address] [place], [place] [address]  CBC     Status: Abnormal   Collection Time: 12/05/23  8:23 AM  Result Value Ref Range   WBC 15.4 (H) 4.0 - 10.5 K/uL   RBC 3.78 (L) 3.87 - 5.11 MIL/uL   Hemoglobin 12.4 12.0 - 15.0 g/dL   HCT 62.4 63.9 - 53.9 %   MCV 99.2 80.0 - 100.0 fL   MCH 32.8 26.0 - 34.0 pg   MCHC 33.1 30.0 - 36.0 g/dL   RDW 84.8 88.4 - 84.4 %   Platelets 287 150 - 400 K/uL   nRBC 0.0 0.0 - 0.2 %    Comment: Performed at [facility], [address], [place], [place] [address]      Assessment & Plan:  There are no diagnoses linked to this encounter.  Problem List Items Addressed This Visit   None   No follow-ups on file.   Total time spent: 20 minutes. This time includes review of previous notes and results and patient face to face interaction during today'Tyr Franca visit.    Sherrill Cinderella Perry, MD  02/12/2024   This document may have been prepared by [facility] Voice Recognition software and as such may include unintentional dictation errors.     [1]  Allergies Allergen Reactions   Amitriptyline Other (See Comments)    Other reaction(Jean Alejos): Other  Change in mental status   Amoxicillin  Rash   Bromelains     Other reaction(Jordanny Waddington): Abdominal pain   Celexa [Citalopram Hydrobromide] Other (See Comments)    Other reaction(Emmons Toth): Other Change in mental status   Nortriptyline     Other reaction(Justise Ehmann): Other   Penicillins Swelling and Rash   Pregabalin     Other reaction(Lenin Kuhnle): Myalgias (muscle pain)   Bromelains [Pineapple Extract] Other (See Comments)    Severe pain   Budesonide-Formoterol Fumarate     Other reaction(Cheryllynn Sarff): Other   Citalopram Other (See Comments)   Mangifera Indica Swelling   Methylene Blue Other (See Comments)    Caused inflamed pancreas, per pt should not use   Sertraline Other (See Comments)    Other reaction(Bristal Steffy): Other Suicidal thoughts   Sulfa Antibiotics Other (See Comments)   Sulfonamide Derivatives Other (See Comments)    excrutiating pain, patient states it 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  azelastine  (ASTELIN ) 0.1 % nasal spray Place 2 sprays into both nostrils 2 (two) times daily.   Cholecalciferol (VITAMIN D3) 5000 units TABS Take by mouth daily.   Cyanocobalamin  (VITAMIN B-12) 5000 MCG SUBL Take 5,000 mcg by mouth daily.   cyclobenzaprine  (FLEXERIL ) 10 MG tablet Take 1 tablet (10 mg total) by mouth 3 (three) times daily.   diazepam  (VALIUM ) 10 MG tablet Take 10 mg by mouth daily.   dicyclomine  (BENTYL ) 20 MG tablet TAKE ONE TABLET FOUR TIMES DAILY   ELMIRON  100 MG capsule Take 200 mg by mouth 2 (two) times daily.   estradiol (ESTRACE) 0.1 MG/GM vaginal cream pea sized amount to external vagina 2 times weekly   fexofenadine (ALLEGRA) 180 MG tablet Take 180 mg by mouth every  morning.   fluticasone  (FLONASE ) 50 MCG/ACT nasal spray Place 1 spray into both nostrils daily.   fluticasone -salmeterol (ADVAIR) 250-50 MCG/ACT AEPB Inhale 1 puff into the lungs in the morning and at bedtime.   furosemide  (LASIX ) 20 MG tablet Take 1 tablet (20 mg total) by mouth daily.   gabapentin (NEURONTIN) 800 MG tablet Take 800 mg by mouth 3 (three) times daily.   hydrOXYzine  (ATARAX /VISTARIL ) 50 MG tablet Take 50 mg by mouth at bedtime.   hyoscyamine (LEVSIN SL) 0.125 MG SL tablet DISSOLVE 1 TABLET UNDER TONGUE EVERY 4 HOURS AS NEEDED FOR CRAMPING   MAY USE BY MOUTH OR UNDER THE TONGUE   ibuprofen  (ADVIL ) 800 MG tablet Take 1 tablet (800 mg total) by mouth 3 (three) times daily.   iron  polysaccharides (NIFEREX) 150 MG capsule Take 1 capsule (150 mg total) by mouth daily.   ivabradine (CORLANOR) 5 MG TABS tablet Take 5 mg by mouth 2 (two) times daily with a meal.   Lactobacillus (ACIDOPHILUS PROBIOTIC PO) Take by mouth 3 (three) times daily.   lidocaine  (XYLOCAINE ) 5 % ointment Apply topically daily.   montelukast (SINGULAIR) 10 MG tablet Take 10 mg by mouth at bedtime.   nitrofurantoin (MACRODANTIN) 50 MG capsule Take 50 mg by mouth daily.   ondansetron  (ZOFRAN -ODT) 4 MG disintegrating tablet TAKE ONE TABLET BY MOUTH EVERY 8 HOURS AS NEEDED FOR NAUSEA / VOMITING   oxybutynin  (DITROPAN ) 5 MG tablet Take 5 mg by mouth 3 (three) times daily as needed.   oxyCODONE -acetaminophen  (PERCOCET/ROXICET) 5-325 MG tablet Take 1 tablet by mouth every 8 (eight) hours as needed for severe pain (pain score 7-10).   PROCTOZONE-HC 2.5 % rectal cream Apply topically as needed.   pseudoephedrine  (SUDAFED) 30 MG tablet Take 30 mg by mouth every 6 (six) hours as needed.   Simethicone  125 MG CAPS Take 125 mg by mouth 4 (four) times daily.   valACYclovir  (VALTREX ) 1000 MG tablet TAKE ONE TABLET BY MOUTH TWICE DAILY   valACYclovir  (VALTREX ) 500 MG tablet TAKE ONE TABLET TWICE DAILY   Zinc 50 MG TABS Take 50 mg by  mouth daily.   No facility-administered medications prior to visit.   "

## 2024-02-13 ENCOUNTER — Other Ambulatory Visit: Payer: Self-pay | Admitting: Cardiology

## 2024-02-14 NOTE — Telephone Encounter (Signed)
 In accordance with refill protocols, please review and address the following requirements before this medication refill can be authorized:  Labs

## 2024-02-14 NOTE — Telephone Encounter (Signed)
 It looks like the patient has been following with [facility] over the last several months.  I recommend she reach out to her provider there for ongoing refills of her furosemide .  Lonni Hanson, MD [facility]

## 2024-02-19 ENCOUNTER — Other Ambulatory Visit: Payer: Self-pay | Admitting: Internal Medicine

## 2024-02-19 DIAGNOSIS — J4520 Mild intermittent asthma, uncomplicated: Secondary | ICD-10-CM

## 2024-02-21 ENCOUNTER — Other Ambulatory Visit: Payer: Self-pay | Admitting: Podiatry

## 2024-02-22 NOTE — Telephone Encounter (Signed)
 Patient reports that once she started PT she has experienced an increase in her pain level. She is following all recommendations. She wonders if it could be weather related due to the pain as well. Rates pain 7/10, pain level goal per patient is 5. using ice, started it last night. She is using Tylenol  arthritis strength and ibuprofen . Patient would like a refill on her Percocet.

## 2024-03-18 ENCOUNTER — Ambulatory Visit: Admitting: Podiatry

## 2024-05-12 ENCOUNTER — Ambulatory Visit: Admitting: Internal Medicine
